# Patient Record
Sex: Male | Born: 1940 | Race: Black or African American | Hispanic: No
Health system: Southern US, Community
[De-identification: ages and names within clinical notes are randomized; demographics above are authoritative.]

## PROBLEM LIST (undated history)

## (undated) DIAGNOSIS — J90 Pleural effusion, not elsewhere classified: Secondary | ICD-10-CM

## (undated) DIAGNOSIS — Z930 Tracheostomy status: Secondary | ICD-10-CM

## (undated) DIAGNOSIS — H409 Unspecified glaucoma: Secondary | ICD-10-CM

## (undated) DIAGNOSIS — I469 Cardiac arrest, cause unspecified: Secondary | ICD-10-CM

## (undated) DIAGNOSIS — T7840XA Allergy, unspecified, initial encounter: Secondary | ICD-10-CM

## (undated) DIAGNOSIS — N189 Chronic kidney disease, unspecified: Secondary | ICD-10-CM

## (undated) DIAGNOSIS — M1990 Unspecified osteoarthritis, unspecified site: Secondary | ICD-10-CM

## (undated) DIAGNOSIS — I482 Chronic atrial fibrillation, unspecified: Secondary | ICD-10-CM

## (undated) DIAGNOSIS — J9621 Acute and chronic respiratory failure with hypoxia: Secondary | ICD-10-CM

## (undated) DIAGNOSIS — I10 Essential (primary) hypertension: Secondary | ICD-10-CM

## (undated) DIAGNOSIS — J189 Pneumonia, unspecified organism: Secondary | ICD-10-CM

## (undated) HISTORY — DX: Chronic kidney disease, unspecified: N18.9

## (undated) HISTORY — DX: Unspecified osteoarthritis, unspecified site: M19.90

## (undated) HISTORY — DX: Unspecified glaucoma: H40.9

## (undated) HISTORY — PX: EYE SURGERY: SHX253

## (undated) HISTORY — PX: COLONOSCOPY: SHX174

## (undated) HISTORY — DX: Allergy, unspecified, initial encounter: T78.40XA

---

## 2013-11-06 DIAGNOSIS — H2589 Other age-related cataract: Secondary | ICD-10-CM | POA: Diagnosis not present

## 2013-11-06 DIAGNOSIS — IMO0002 Reserved for concepts with insufficient information to code with codable children: Secondary | ICD-10-CM | POA: Diagnosis not present

## 2013-11-06 DIAGNOSIS — H02839 Dermatochalasis of unspecified eye, unspecified eyelid: Secondary | ICD-10-CM | POA: Diagnosis not present

## 2013-12-08 ENCOUNTER — Emergency Department (INDEPENDENT_AMBULATORY_CARE_PROVIDER_SITE_OTHER)
Admission: EM | Admit: 2013-12-08 | Discharge: 2013-12-08 | Disposition: A | Discharge status: Home / Self Care | Payer: Medicare Other | Source: Home / Self Care | Attending: Emergency Medicine | Admitting: Emergency Medicine

## 2013-12-08 ENCOUNTER — Encounter (HOSPITAL_COMMUNITY): Payer: Self-pay | Admitting: Emergency Medicine

## 2013-12-08 DIAGNOSIS — I10 Essential (primary) hypertension: Secondary | ICD-10-CM | POA: Diagnosis not present

## 2013-12-08 HISTORY — DX: Essential (primary) hypertension: I10

## 2013-12-08 MED ORDER — HYDROCHLOROTHIAZIDE 25 MG PO TABS: 25.0000 mg | ORAL_TABLET | Freq: Every day | ORAL | Status: DC

## 2013-12-08 MED ORDER — LISINOPRIL 10 MG PO TABS: 10.0000 mg | ORAL_TABLET | Freq: Every day | ORAL | Status: DC

## 2013-12-08 NOTE — Discharge Instructions (Signed)
DASH Eating Plan °DASH stands for "Dietary Approaches to Stop Hypertension." The DASH eating plan is a healthy eating plan that has been shown to reduce high blood pressure (hypertension). Additional health benefits may include reducing the risk of type 2 diabetes mellitus, heart disease, and stroke. The DASH eating plan may also help with weight loss. °WHAT DO I NEED TO KNOW ABOUT THE DASH EATING PLAN? °For the DASH eating plan, you will follow these general guidelines: °· Choose foods with a percent daily value for sodium of less than 5% (as listed on the food label). °· Use salt-free seasonings or herbs instead of table salt or sea salt. °· Check with your health care provider or pharmacist before using salt substitutes. °· Eat lower-sodium products, often labeled as "lower sodium" or "no salt added." °· Eat fresh foods. °· Eat more vegetables, fruits, and low-fat dairy products. °· Choose whole grains. Look for the word "whole" as the first word in the ingredient list. °· Choose fish and skinless chicken or turkey more often than red meat. Limit fish, poultry, and meat to 6 oz (170 g) each day. °· Limit sweets, desserts, sugars, and sugary drinks. °· Choose heart-healthy fats. °· Limit cheese to 1 oz (28 g) per day. °· Eat more home-cooked food and less restaurant, buffet, and fast food. °· Limit fried foods. °· Cook foods using methods other than frying. °· Limit canned vegetables. If you do use them, rinse them well to decrease the sodium. °· When eating at a restaurant, ask that your food be prepared with less salt, or no salt if possible. °WHAT FOODS CAN I EAT? °Seek help from a dietitian for individual calorie needs. °Grains °Whole grain or whole wheat bread. Brown rice. Whole grain or whole wheat pasta. Quinoa, bulgur, and whole grain cereals. Low-sodium cereals. Corn or whole wheat flour tortillas. Whole grain cornbread. Whole grain crackers. Low-sodium crackers. °Vegetables °Fresh or frozen vegetables  (raw, steamed, roasted, or grilled). Low-sodium or reduced-sodium tomato and vegetable juices. Low-sodium or reduced-sodium tomato sauce and paste. Low-sodium or reduced-sodium canned vegetables.  °Fruits °All fresh, canned (in natural juice), or frozen fruits. °Meat and Other Protein Products °Ground beef (85% or leaner), grass-fed beef, or beef trimmed of fat. Skinless chicken or turkey. Ground chicken or turkey. Pork trimmed of fat. All fish and seafood. Eggs. Dried beans, peas, or lentils. Unsalted nuts and seeds. Unsalted canned beans. °Dairy °Low-fat dairy products, such as skim or 1% milk, 2% or reduced-fat cheeses, low-fat ricotta or cottage cheese, or plain low-fat yogurt. Low-sodium or reduced-sodium cheeses. °Fats and Oils °Tub margarines without trans fats. Light or reduced-fat mayonnaise and salad dressings (reduced sodium). Avocado. Safflower, olive, or canola oils. Natural peanut or almond butter. °Other °Unsalted popcorn and pretzels. °The items listed above may not be a complete list of recommended foods or beverages. Contact your dietitian for more options. °WHAT FOODS ARE NOT RECOMMENDED? °Grains °White bread. White pasta. White rice. Refined cornbread. Bagels and croissants. Crackers that contain trans fat. °Vegetables °Creamed or fried vegetables. Vegetables in a cheese sauce. Regular canned vegetables. Regular canned tomato sauce and paste. Regular tomato and vegetable juices. °Fruits °Dried fruits. Canned fruit in light or heavy syrup. Fruit juice. °Meat and Other Protein Products °Fatty cuts of meat. Ribs, chicken wings, bacon, sausage, bologna, salami, chitterlings, fatback, hot dogs, bratwurst, and packaged luncheon meats. Salted nuts and seeds. Canned beans with salt. °Dairy °Whole or 2% milk, cream, half-and-half, and cream cheese. Whole-fat or sweetened yogurt. Full-fat   cheeses or blue cheese. Nondairy creamers and whipped toppings. Processed cheese, cheese spreads, or cheese  curds. °Condiments °Onion and garlic salt, seasoned salt, table salt, and sea salt. Canned and packaged gravies. Worcestershire sauce. Tartar sauce. Barbecue sauce. Teriyaki sauce. Soy sauce, including reduced sodium. Steak sauce. Fish sauce. Oyster sauce. Cocktail sauce. Horseradish. Ketchup and mustard. Meat flavorings and tenderizers. Bouillon cubes. Hot sauce. Tabasco sauce. Marinades. Taco seasonings. Relishes. °Fats and Oils °Butter, stick margarine, lard, shortening, ghee, and bacon fat. Coconut, palm kernel, or palm oils. Regular salad dressings. °Other °Pickles and olives. Salted popcorn and pretzels. °The items listed above may not be a complete list of foods and beverages to avoid. Contact your dietitian for more information. °WHERE CAN I FIND MORE INFORMATION? °National [employer], [employer], [employer]: www.nhlbi.nih.gov/health/health-topics/topics/dash/ °Document Released: 04/20/2011 Document Revised: 09/15/2013 Document Reviewed: 03/05/2013 °Exit[employer]® Patient Information ©2015 [employer]. This information is not intended to replace advice given to you by your health care provider. Make sure you discuss any questions you have with your health care provider. ° °Hypertension °Hypertension, commonly called high blood pressure, is when the force of blood pumping through your arteries is too strong. Your arteries are the blood vessels that carry blood from your heart throughout your body. A blood pressure reading consists of a higher number over a lower number, such as 110/72. The higher number (systolic) is the pressure inside your arteries when your heart pumps. The lower number (diastolic) is the pressure inside your arteries when your heart relaxes. Ideally you want your blood pressure below 120/80. °Hypertension forces your heart to work harder to pump blood. Your arteries may become narrow or stiff. Having hypertension puts you at risk for heart disease, stroke, and other problems.  °RISK  FACTORS °Some risk factors for high blood pressure are controllable. Others are not.  °Risk factors you cannot control include:  °· Race. You may be at higher risk if you are African American. °· Age. Risk increases with age. °· Gender. Men are at higher risk than women before age 45 years. After age 65, women are at higher risk than men. °Risk factors you can control include: °· Not getting enough exercise or physical activity. °· Being overweight. °· Getting too much fat, sugar, calories, or salt in your diet. °· Drinking too much alcohol. °SIGNS AND SYMPTOMS °Hypertension does not usually cause signs or symptoms. Extremely high blood pressure (hypertensive crisis) may cause headache, anxiety, shortness of breath, and nosebleed. °DIAGNOSIS  °To check if you have hypertension, your health care provider will measure your blood pressure while you are seated, with your arm held at the level of your heart. It should be measured at least twice using the same arm. Certain conditions can cause a difference in blood pressure between your right and left arms. A blood pressure reading that is higher than normal on one occasion does not mean that you need treatment. If one blood pressure reading is high, ask your health care provider about having it checked again. °TREATMENT  °Treating high blood pressure includes making lifestyle changes and possibly taking medicine. Living a healthy lifestyle can help lower high blood pressure. You may need to change some of your habits. °Lifestyle changes may include: °· Following the DASH diet. This diet is high in fruits, vegetables, and whole grains. It is low in salt, red meat, and added sugars. °· Getting at least 2½ hours of brisk physical activity every week. °· Losing weight if necessary. °· Not smoking. °·   Limiting alcoholic beverages.  Learning ways to reduce stress. If lifestyle changes are not enough to get your blood pressure under control, your health care provider may  prescribe medicine. You may need to take more than one. Work closely with your health care provider to understand the risks and benefits. HOME CARE INSTRUCTIONS  Have your blood pressure rechecked as directed by your health care provider.   Take medicines only as directed by your health care provider. Follow the directions carefully. Blood pressure medicines must be taken as prescribed. The medicine does not work as well when you skip doses. Skipping doses also puts you at risk for problems.   Do not smoke.   Monitor your blood pressure at home as directed by your health care provider. SEEK MEDICAL CARE IF:   You think you are having a reaction to medicines taken.  You have recurrent headaches or feel dizzy.  You have swelling in your ankles.  You have trouble with your vision. SEEK IMMEDIATE MEDICAL CARE IF:  You develop a severe headache or confusion.  You have unusual weakness, numbness, or feel faint.  You have severe chest or abdominal pain.  You vomit repeatedly.  You have trouble breathing. MAKE SURE YOU:   Understand these instructions.  Will watch your condition.  Will get help right away if you are not doing well or get worse. Document Released: 05/01/2005 Document Revised: 09/15/2013 Document Reviewed: 02/21/2013 [facility] Patient Information 2015 [place], [place]. This information is not intended to replace advice given to you by your health care provider. Make sure you discuss any questions you have with your health care provider.  Managing Your High Blood Pressure Blood pressure is a measurement of how forceful your blood is pressing against the walls of the arteries. Arteries are muscular tubes within the circulatory system. Blood pressure does not stay the same. Blood pressure rises when you are active, excited, or nervous; and it lowers during sleep and relaxation. If the numbers measuring your blood pressure stay above normal most of the time, you are at  risk for health problems. High blood pressure (hypertension) is a long-term (chronic) condition in which blood pressure is elevated. A blood pressure reading is recorded as two numbers, such as 120 over 80 (or 120/80). The first, higher number is called the systolic pressure. It is a measure of the pressure in your arteries as the heart beats. The second, lower number is called the diastolic pressure. It is a measure of the pressure in your arteries as the heart relaxes between beats.  Keeping your blood pressure in a normal range is important to your overall health and prevention of health problems, such as heart disease and stroke. When your blood pressure is uncontrolled, your heart has to work harder than normal. High blood pressure is a very common condition in adults because blood pressure tends to rise with age. Men and women are equally likely to have hypertension but at different times in life. Before age 98, men are more likely to have hypertension. After 72 years of age, women are more likely to have it. Hypertension is especially common in African Americans. This condition often has no signs or symptoms. The cause of the condition is usually not known. Your caregiver can help you come up with a plan to keep your blood pressure in a normal, healthy range. BLOOD PRESSURE STAGES Blood pressure is classified into four stages: normal, prehypertension, stage 1, and stage 2. Your blood pressure reading will be used  to determine what type of treatment, if any, is necessary. Appropriate treatment options are tied to these four stages:  Normal  Systolic pressure (mm Hg): below 120.  Diastolic pressure (mm Hg): below 80. Prehypertension  Systolic pressure (mm Hg): 120 to 139.  Diastolic pressure (mm Hg): 80 to 89. Stage1  Systolic pressure (mm Hg): 140 to 159.  Diastolic pressure (mm Hg): 90 to 99. Stage2  Systolic pressure (mm Hg): 160 or above.  Diastolic pressure (mm Hg): 100 or  above. RISKS RELATED TO HIGH BLOOD PRESSURE Managing your blood pressure is an important responsibility. Uncontrolled high blood pressure can lead to:  A heart attack.  A stroke.  A weakened blood vessel (aneurysm).  Heart failure.  Kidney damage.  Eye damage.  Metabolic syndrome.  Memory and concentration problems. HOW TO MANAGE YOUR BLOOD PRESSURE Blood pressure can be managed effectively with lifestyle changes and medicines (if needed). Your caregiver will help you come up with a plan to bring your blood pressure within a normal range. Your plan should include the following: Education  Read all information provided by your caregivers about how to control blood pressure.  Educate yourself on the latest guidelines and treatment recommendations. New research is always being done to further define the risks and treatments for high blood pressure. Lifestylechanges  Control your weight.  Avoid smoking.  Stay physically active.  Reduce the amount of salt in your diet.  Reduce stress.  Control any chronic conditions, such as high cholesterol or diabetes.  Reduce your alcohol intake. Medicines  Several medicines (antihypertensive medicines) are available, if needed, to bring blood pressure within a normal range. Communication  Review all the medicines you take with your caregiver because there may be side effects or interactions.  Talk with your caregiver about your diet, exercise habits, and other lifestyle factors that may be contributing to high blood pressure.  See your caregiver regularly. Your caregiver can help you create and adjust your plan for managing high blood pressure. RECOMMENDATIONS FOR TREATMENT AND FOLLOW-UP  The following recommendations are based on current guidelines for managing high blood pressure in nonpregnant adults. Use these recommendations to identify the proper follow-up period or treatment option based on your blood pressure reading. You  can discuss these options with your caregiver.  Systolic pressure of 960 to 454 or diastolic pressure of 80 to 89: Follow up with your caregiver as directed.  Systolic pressure of 098 to 119 or diastolic pressure of 90 to 100: Follow up with your caregiver within 2 months.  Systolic pressure above 147 or diastolic pressure above 829: Follow up with your caregiver within 1 month.  Systolic pressure above 562 or diastolic pressure above 130: Consider antihypertensive therapy; follow up with your caregiver within 1 week.  Systolic pressure above 865 or diastolic pressure above 784: Begin antihypertensive therapy; follow up with your caregiver within 1 week. Document Released: 01/24/2012 Document Reviewed: 01/24/2012 [employer] Patient Information 2015 [place]. This information is not intended to replace advice given to you by your health care provider. Make sure you discuss any questions you have with your health care provider.

## 2013-12-08 NOTE — ED Provider Notes (Signed)
Medical screening examination/treatment/procedure(s) were performed by resident physician or non-physician practitioner and as supervising physician I was immediately available for consultation/collaboration.  Maryruth Eve, MD     Melony Overly, MD 12/08/13 [phone]

## 2013-12-08 NOTE — ED Provider Notes (Signed)
CSN: [id number]     Arrival date & time 12/08/13  1247 History   First MD Initiated Contact with Patient 12/08/13 1302     No chief complaint on file.  (Consider location/radiation/quality/duration/timing/severity/associated sxs/prior Treatment) HPI Comments: States he was told he had elevated blood pressure 4-5 years ago by his provider at [facility] and was placed on an unknown medication that he was told was for his blood pressure. States he has long since run out of this medication. Went to have surgery on his right eye today and procedure was cancelled because patient was told his blood pressure was too high. Patient and spouse said they decided to come here for treatment.  Patient reports himself to be asymptomatic. Does not smoke or drink Is retired   Patient is a 73 y.o. male presenting with hypertension. The history is provided by the patient and the spouse.  Hypertension This is a chronic problem. Episode onset: ? 4-5 years. The problem occurs constantly. Associated symptoms comments: none.    No past medical history on file. No past surgical history on file. No family history on file. History  Substance Use Topics  . Smoking status: Not on file  . Smokeless tobacco: Not on file  . Alcohol Use: Not on file    Review of Systems  All other systems reviewed and are negative.   Allergies  Review of patient's allergies indicates not on file.  Home Medications   Prior to Admission medications   Medication Sig Start Date End Date Taking? Authorizing Provider  hydrochlorothiazide (HYDRODIURIL) 25 MG tablet Take 1 tablet (25 mg total) by mouth daily. 12/08/13   Lahoma Rocker, PA  lisinopril (PRINIVIL,ZESTRIL) 10 MG tablet Take 1 tablet (10 mg total) by mouth daily. 12/08/13   Annett Gula Coralie Stanke, PA   BP 213/111  Pulse 72  Temp(Src) 98.1 F (36.7 C) (Oral)  Resp 18  Ht 5\' 11"  (1.803 m)  Wt 225 lb (102.059 kg)  BMI 31.39 kg/m2  SpO2  97% Physical Exam  Nursing note and vitals reviewed. Constitutional: He is oriented to person, place, and time. He appears well-developed and well-nourished. No distress.  +overweight  HENT:  Head: Normocephalic and atraumatic.  Eyes: Conjunctivae are normal. No scleral icterus.  Neck: Normal range of motion. Neck supple.  Cardiovascular: Normal rate, regular rhythm and normal heart sounds.   Pulmonary/Chest: Effort normal and breath sounds normal. No respiratory distress. He has no wheezes.  Musculoskeletal: Normal range of motion. He exhibits edema. He exhibits no tenderness.  1-2+ non-pitting edema at bilateral ankles  Neurological: He is alert and oriented to person, place, and time.  Skin: Skin is warm and dry.  Psychiatric: He has a normal mood and affect. His behavior is normal.    ED Course  Procedures (including critical care time) Labs Review Labs Reviewed - No data to display  Imaging Review No results found.   MDM   1. Essential hypertension    Patient advised he would need to re-establish care with either [employer] or the primary care physician of his choice for continued management of his elevated blood pressure and for any additional routine medical needs. Will have patient begin taking HCTZ 25 mg po QD & Lisinopril 10mg  po QD.    Lahoma Rocker, PA 12/08/13 1332

## 2013-12-08 NOTE — ED Notes (Signed)
Provider eval only 

## 2013-12-15 ENCOUNTER — Ambulatory Visit (INDEPENDENT_AMBULATORY_CARE_PROVIDER_SITE_OTHER): Payer: Medicare Other | Admitting: Internal Medicine

## 2013-12-15 ENCOUNTER — Ambulatory Visit (INDEPENDENT_AMBULATORY_CARE_PROVIDER_SITE_OTHER): Payer: Medicare Other

## 2013-12-15 VITALS — BP 202/108 | HR 62 | Temp 98.3°F | Resp 16 | Ht 71.0 in | Wt 241.0 lb

## 2013-12-15 DIAGNOSIS — Z5181 Encounter for therapeutic drug level monitoring: Secondary | ICD-10-CM | POA: Diagnosis not present

## 2013-12-15 DIAGNOSIS — E669 Obesity, unspecified: Secondary | ICD-10-CM | POA: Diagnosis not present

## 2013-12-15 DIAGNOSIS — R0989 Other specified symptoms and signs involving the circulatory and respiratory systems: Secondary | ICD-10-CM

## 2013-12-15 DIAGNOSIS — R609 Edema, unspecified: Secondary | ICD-10-CM | POA: Diagnosis not present

## 2013-12-15 DIAGNOSIS — R0689 Other abnormalities of breathing: Secondary | ICD-10-CM

## 2013-12-15 DIAGNOSIS — I10 Essential (primary) hypertension: Secondary | ICD-10-CM | POA: Diagnosis not present

## 2013-12-15 DIAGNOSIS — Z125 Encounter for screening for malignant neoplasm of prostate: Secondary | ICD-10-CM

## 2013-12-15 DIAGNOSIS — I517 Cardiomegaly: Secondary | ICD-10-CM

## 2013-12-15 LAB — POCT CBC
Granulocyte percent: 64.6 %G (ref 37–80)
HEMATOCRIT: 44.8 % (ref 43.5–53.7)
HEMOGLOBIN: 14.5 g/dL (ref 14.1–18.1)
Lymph, poc: 1.9 (ref 0.6–3.4)
MCH, POC: 27.7 pg (ref 27–31.2)
MCHC: 32.4 g/dL (ref 31.8–35.4)
MCV: 85.4 fL (ref 80–97)
MID (cbc): 0.5 (ref 0–0.9)
MPV: 7.7 fL (ref 0–99.8)
POC GRANULOCYTE: 4.4 (ref 2–6.9)
POC LYMPH %: 28.1 % (ref 10–50)
POC MID %: 7.3 %M (ref 0–12)
Platelet Count, POC: 162 10*3/uL (ref 142–424)
RBC: 5.25 M/uL (ref 4.69–6.13)
RDW, POC: 14.3 %
WBC: 6.8 10*3/uL (ref 4.6–10.2)

## 2013-12-15 LAB — GLUCOSE, POCT (MANUAL RESULT ENTRY): POC GLUCOSE: 80 mg/dL (ref 70–99)

## 2013-12-15 LAB — POCT UA - MICROSCOPIC ONLY
BACTERIA, U MICROSCOPIC: NEGATIVE
CASTS, UR, LPF, POC: NEGATIVE
Crystals, Ur, HPF, POC: NEGATIVE
Mucus, UA: NEGATIVE
RBC, urine, microscopic: NEGATIVE
Yeast, UA: NEGATIVE

## 2013-12-15 LAB — POCT URINALYSIS DIPSTICK
BILIRUBIN UA: NEGATIVE
Blood, UA: NEGATIVE
GLUCOSE UA: NEGATIVE
Ketones, UA: NEGATIVE
Leukocytes, UA: NEGATIVE
NITRITE UA: NEGATIVE
SPEC GRAV UA: 1.015
UROBILINOGEN UA: 0.2
pH, UA: 6

## 2013-12-15 LAB — POCT GLYCOSYLATED HEMOGLOBIN (HGB A1C): HEMOGLOBIN A1C: 5.8

## 2013-12-15 IMAGING — CR DG CHEST 2V
2 series · 2 of 2 positions shown · non-contrast
Comparison: None.

CLINICAL DATA: Abnormal breath sounds, edema. History of
hypertension.

EXAM:
CHEST  2 VIEW

[PA]
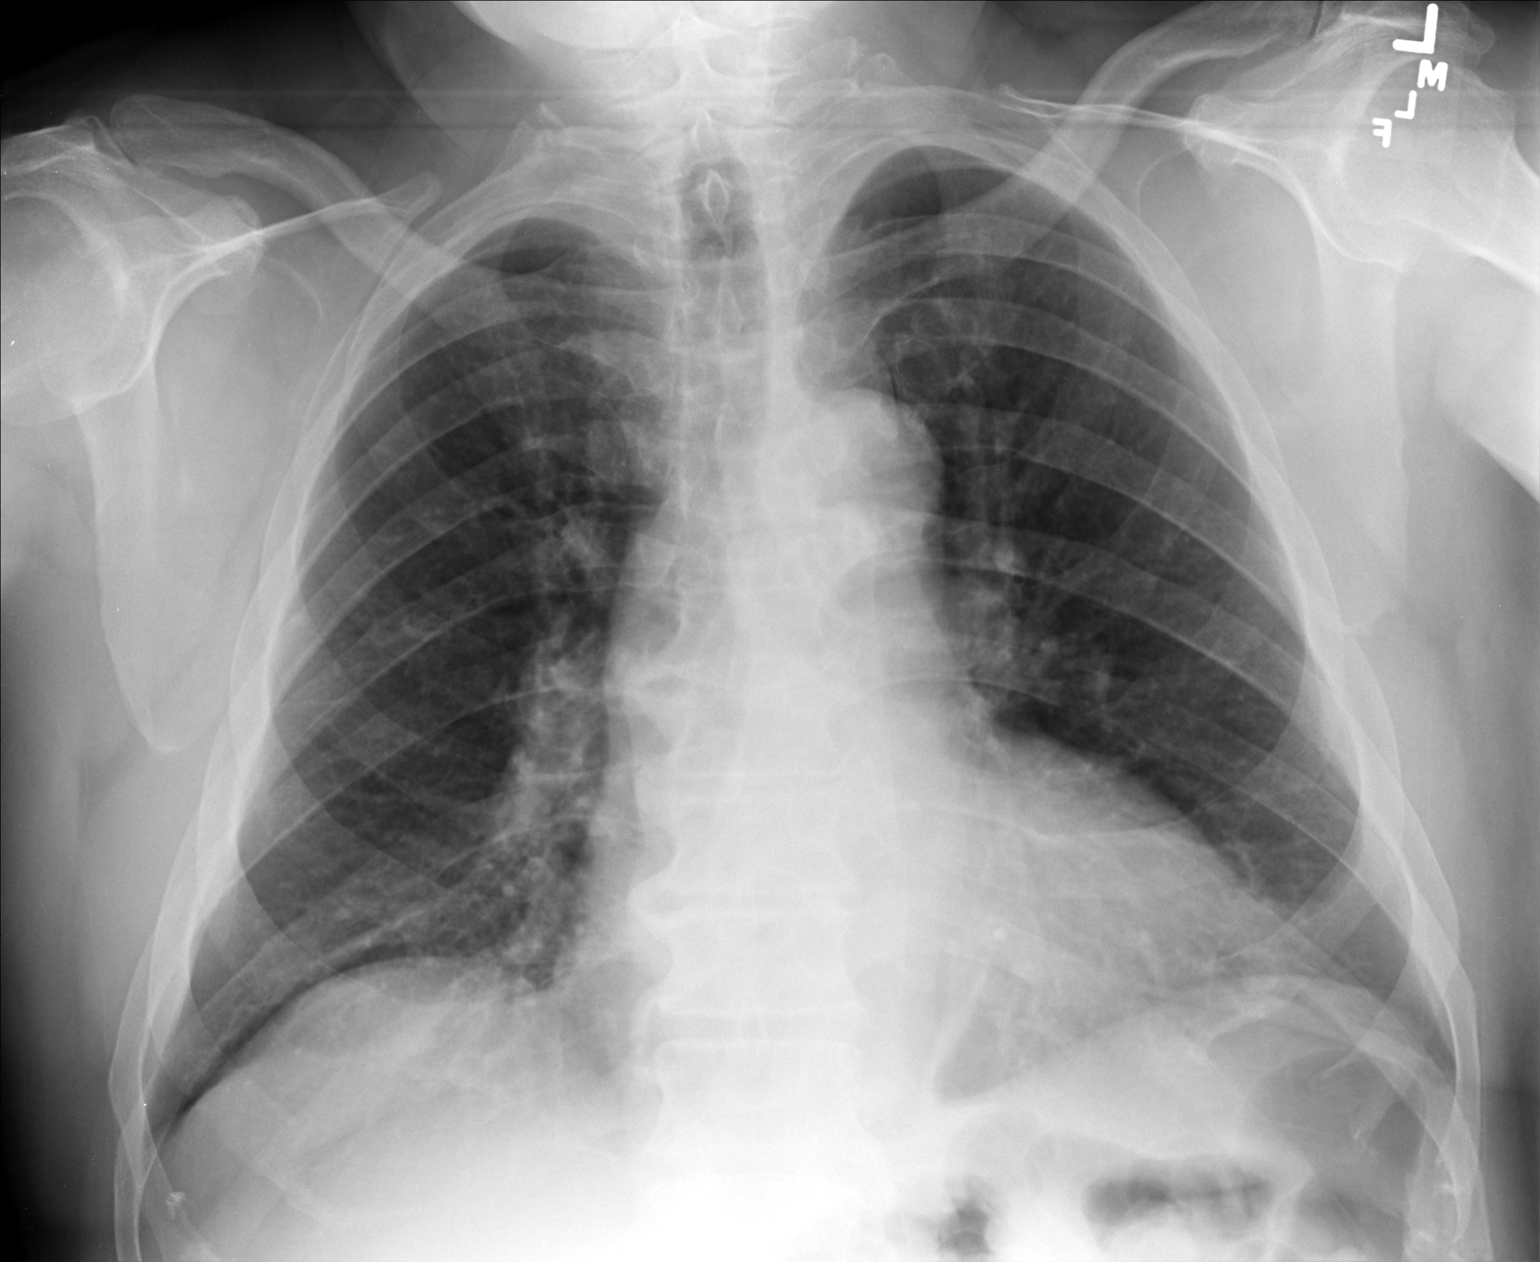

[lateral]
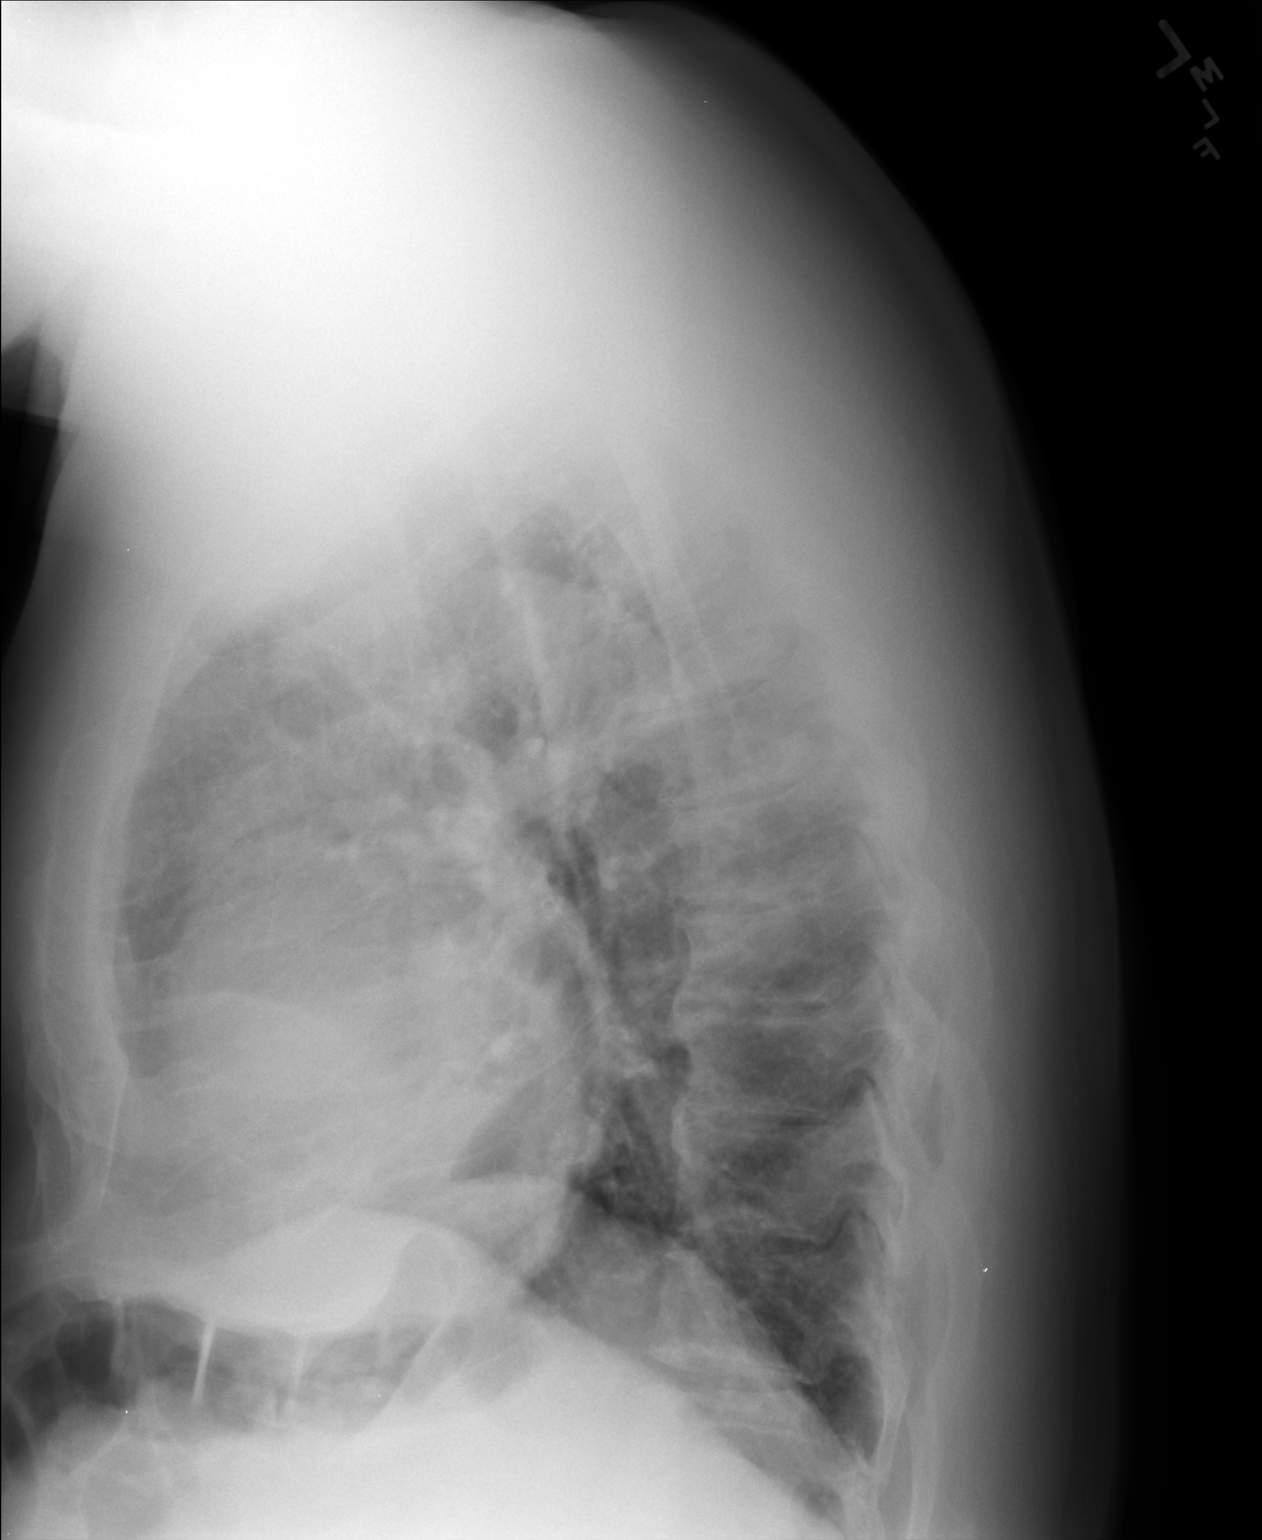

[2 of 2 positions shown; findings below may reference images not displayed]

FINDINGS: The heart size is at upper limits of normal with unfolding/ectasia
of the aorta. Both lungs are clear. The visualized skeletal
structures are unremarkable.
IMPRESSION: No active cardiopulmonary disease.

## 2013-12-15 MED ORDER — AMLODIPINE BESYLATE 10 MG PO TABS
10.0000 mg | ORAL_TABLET | Freq: Every day | ORAL | Status: DC
Start: 2013-12-15 — End: 2014-12-04

## 2013-12-15 MED ORDER — LISINOPRIL-HYDROCHLOROTHIAZIDE 20-25 MG PO TABS: 1.0000 | ORAL_TABLET | Freq: Every day | ORAL | Status: DC

## 2013-12-15 NOTE — Progress Notes (Signed)
   Subjective:    Patient ID: Daniel [name], male    DOB: Oct 01, 1940, 73 y.o.   MRN: [id number]  HPI    Review of Systems     Objective:   Physical Exam        Assessment & Plan:

## 2013-12-15 NOTE — Progress Notes (Signed)
Subjective:    Patient ID: Daniel [name], male    DOB: Mar 22, 1941, 73 y.o.   MRN: [id number]  HPI 73 year old male complains of hypertension. 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 His blood pressure was 207/102 right before surgery so they would not perform surgery till his blood pressure is under control. No previous history of hypertension. He has been put on HCTZ and Lisinopril by [facility] on on 12/08/2013 and his blood pressure has still been running high. He did not have lab screening or EKG. 6 years ago his wifed died and he retired and he gained 30lbs and HTN started and he quit having regular medical care. He feels good but his daughter noticed he had leg edema. No sob, cp, syncope, anorexia,fatigue. He took his meds today.    Review of Systems     Objective:   Physical Exam  Vitals reviewed. Constitutional: He is oriented to person, place, and time. He appears well-developed and well-nourished. No distress.  HENT:  Head: Normocephalic.  Right Ear: External ear normal.  Left Ear: External ear normal.  Mouth/Throat: Oropharynx is clear and moist.  Eyes: EOM are normal. Pupils are equal, round, and reactive to light.  Neck: Normal range of motion. Neck supple. No thyromegaly present.  Cardiovascular: Normal rate, regular rhythm, normal heart sounds and intact distal pulses.   No murmur heard. Pulmonary/Chest: Effort normal. Not tachypneic. No respiratory distress. He has no decreased breath sounds. He has wheezes. He has rhonchi. He has no rales.   He exhibits no tenderness.  Abnormal BS does not clear with coughing  Abdominal: Soft. He exhibits no mass. There is no tenderness.  Musculoskeletal: He exhibits edema.  Lymphadenopathy:    He has no cervical adenopathy.  Neurological: He is alert and oriented to person, place, and time. He exhibits normal muscle tone. Coordination normal.  Skin: Skin is dry and intact.     2-3+ pitting edema  Dry,  scaly, intact skin  Psychiatric: He has a normal mood and affect. His behavior is normal. Judgment and thought content normal.   EKG [facility] reading (PRIMARY) by  DrGuest cardiomegaly, no effusion  Labs No results found for this or any previous visit.  Results for orders placed in visit on 12/15/13  POCT CBC      Result Value Ref Range   WBC 6.0  4.6 - 10.2 K/uL   Lymph, poc 2.0  0.6 - 3.4   POC LYMPH PERCENT 33.1  10 - 50 %L   MID (cbc) 0.4  0 - 0.9   POC MID % 7.0  0 - 12 %M   POC Granulocyte 3.6  2 - 6.9   Granulocyte percent 59.9  37 - 80 %G   RBC 5.17  4.69 - 6.13 M/uL   Hemoglobin 14.2  14.1 - 18.1 g/dL   HCT, POC 44.3  43.5 - 53.7 %   MCV 85.6  80 - 97 fL   MCH, POC 27.5  27 - 31.2 pg   MCHC 32.2  31.8 - 35.4 g/dL   RDW, POC 13.8     Platelet Count, POC 67.0 (*) 142 - 424 K/uL   MPV 8.3  0 - 99.8 fL  GLUCOSE, POCT (MANUAL RESULT ENTRY)      Result Value Ref Range   POC Glucose 80  70 - 99 mg/dl  POCT GLYCOSYLATED HEMOGLOBIN (HGB A1C)      Result Value Ref Range  Hemoglobin A1C 5.8    POCT UA - MICROSCOPIC ONLY      Result Value Ref Range   WBC, Ur, HPF, POC 0-3     RBC, urine, microscopic neg     Bacteria, U Microscopic neg     Mucus, UA neg     Epithelial cells, urine per micros 0-4     Crystals, Ur, HPF, POC neg     Casts, Ur, LPF, POC neg     Yeast, UA neg    POCT URINALYSIS DIPSTICK      Result Value Ref Range   Color, UA yellow     Clarity, UA clear     Glucose, UA neg     Bilirubin, UA neg     Ketones, UA neg     Spec Grav, UA 1.015     Blood, UA neg     pH, UA 6.0     Protein, UA trace     Urobilinogen, UA 0.2     Nitrite, UA neg     Leukocytes, UA Negative     Thrombocytopenia ! This is unexpected, will repeat with new blood draw      Assessment & Plan:  Schedule cpe and follow up 104 2 weeks Dash diet Compression stockings medium strength  Start lisinopril 20/25 and amlodipine 10mg  qd  HTN/Edema/Med adjustment RTC 5d see me

## 2013-12-15 NOTE — Patient Instructions (Addendum)
DASH Eating Plan DASH stands for "Dietary Approaches to Stop Hypertension." The DASH eating plan is a healthy eating plan that has been shown to reduce high blood pressure (hypertension). Additional health benefits may include reducing the risk of type 2 diabetes mellitus, heart disease, and stroke. The DASH eating plan may also help with weight loss. WHAT DO I NEED TO KNOW ABOUT THE DASH EATING PLAN? For the DASH eating plan, you will follow these general guidelines:  Choose foods with a percent daily value for sodium of less than 5% (as listed on the food label).  Use salt-free seasonings or herbs instead of table salt or sea salt.  Check with your health care provider or pharmacist before using salt substitutes.  Eat lower-sodium products, often labeled as "lower sodium" or "no salt added."  Eat fresh foods.  Eat more vegetables, fruits, and low-fat dairy products.  Choose whole grains. Look for the word "whole" as the first word in the ingredient list.  Choose fish and skinless chicken or turkey more often than red meat. Limit fish, poultry, and meat to 6 oz (170 g) each day.  Limit sweets, desserts, sugars, and sugary drinks.  Choose heart-healthy fats.  Limit cheese to 1 oz (28 g) per day.  Eat more home-cooked food and less restaurant, buffet, and fast food.  Limit fried foods.  Cook foods using methods other than frying.  Limit canned vegetables. If you do use them, rinse them well to decrease the sodium.  When eating at a restaurant, ask that your food be prepared with less salt, or no salt if possible. WHAT FOODS CAN I EAT? Seek help from a dietitian for individual calorie needs. Grains Whole grain or whole wheat bread. Brown rice. Whole grain or whole wheat pasta. Quinoa, bulgur, and whole grain cereals. Low-sodium cereals. Corn or whole wheat flour tortillas. Whole grain cornbread. Whole grain crackers. Low-sodium crackers. Vegetables Fresh or frozen vegetables  (raw, steamed, roasted, or grilled). Low-sodium or reduced-sodium tomato and vegetable juices. Low-sodium or reduced-sodium tomato sauce and paste. Low-sodium or reduced-sodium canned vegetables.  Fruits All fresh, canned (in natural juice), or frozen fruits. Meat and Other Protein Products Ground beef (85% or leaner), grass-fed beef, or beef trimmed of fat. Skinless chicken or turkey. Ground chicken or turkey. Pork trimmed of fat. All fish and seafood. Eggs. Dried beans, peas, or lentils. Unsalted nuts and seeds. Unsalted canned beans. Dairy Low-fat dairy products, such as skim or 1% milk, 2% or reduced-fat cheeses, low-fat ricotta or cottage cheese, or plain low-fat yogurt. Low-sodium or reduced-sodium cheeses. Fats and Oils Tub margarines without trans fats. Light or reduced-fat mayonnaise and salad dressings (reduced sodium). Avocado. Safflower, olive, or canola oils. Natural peanut or almond butter. Other Unsalted popcorn and pretzels. The items listed above may not be a complete list of recommended foods or beverages. Contact your dietitian for more options. WHAT FOODS ARE NOT RECOMMENDED? Grains White bread. White pasta. White rice. Refined cornbread. Bagels and croissants. Crackers that contain trans fat. Vegetables Creamed or fried vegetables. Vegetables in a cheese sauce. Regular canned vegetables. Regular canned tomato sauce and paste. Regular tomato and vegetable juices. Fruits Dried fruits. Canned fruit in light or heavy syrup. Fruit juice. Meat and Other Protein Products Fatty cuts of meat. Ribs, chicken wings, bacon, sausage, bologna, salami, chitterlings, fatback, hot dogs, bratwurst, and packaged luncheon meats. Salted nuts and seeds. Canned beans with salt. Dairy Whole or 2% milk, cream, half-and-half, and cream cheese. Whole-fat or sweetened yogurt. Full-fat   cheeses or blue cheese. Nondairy creamers and whipped toppings. Processed cheese, cheese spreads, or cheese  curds. Condiments Onion and garlic salt, seasoned salt, table salt, and sea salt. Canned and packaged gravies. Worcestershire sauce. Tartar sauce. Barbecue sauce. Teriyaki sauce. Soy sauce, including reduced sodium. Steak sauce. Fish sauce. Oyster sauce. Cocktail sauce. Horseradish. Ketchup and mustard. Meat flavorings and tenderizers. Bouillon cubes. Hot sauce. Tabasco sauce. Marinades. Taco seasonings. Relishes. Fats and Oils Butter, stick margarine, lard, shortening, ghee, and bacon fat. Coconut, palm kernel, or palm oils. Regular salad dressings. Other Pickles and olives. Salted popcorn and pretzels. The items listed above may not be a complete list of foods and beverages to avoid. Contact your dietitian for more information. WHERE CAN I FIND MORE INFORMATION? National Heart, Lung, and Blood Institute: travelstabloid.com Document Released: 04/20/2011 Document Revised: 09/15/2013 Document Reviewed: 03/05/2013 [facility] Patient Information 2015 [place], [place]. This information is not intended to replace advice given to you by your health care provider. Make sure you discuss any questions you have with your health care provider. Hypertension Hypertension, commonly called high blood pressure, is when the force of blood pumping through your arteries is too strong. Your arteries are the blood vessels that carry blood from your heart throughout your body. A blood pressure reading consists of a higher number over a lower number, such as 110/72. The higher number (systolic) is the pressure inside your arteries when your heart pumps. The lower number (diastolic) is the pressure inside your arteries when your heart relaxes. Ideally you want your blood pressure below 120/80. Hypertension forces your heart to work harder to pump blood. Your arteries may become narrow or stiff. Having hypertension puts you at risk for heart disease, stroke, and other problems.  RISK  FACTORS Some risk factors for high blood pressure are controllable. Others are not.  Risk factors you cannot control include:   Race. You may be at higher risk if you are African American.  Age. Risk increases with age.  Gender. Men are at higher risk than women before age 37 years. After age 55, women are at higher risk than men. Risk factors you can control include:  Not getting enough exercise or physical activity.  Being overweight.  Getting too much fat, sugar, calories, or salt in your diet.  Drinking too much alcohol. SIGNS AND SYMPTOMS Hypertension does not usually cause signs or symptoms. Extremely high blood pressure (hypertensive crisis) may cause headache, anxiety, shortness of breath, and nosebleed. DIAGNOSIS  To check if you have hypertension, your health care provider will measure your blood pressure while you are seated, with your arm held at the level of your heart. It should be measured at least twice using the same arm. Certain conditions can cause a difference in blood pressure between your right and left arms. A blood pressure reading that is higher than normal on one occasion does not mean that you need treatment. If one blood pressure reading is high, ask your health care provider about having it checked again. TREATMENT  Treating high blood pressure includes making lifestyle changes and possibly taking medicine. Living a healthy lifestyle can help lower high blood pressure. You may need to change some of your habits. Lifestyle changes may include:  Following the DASH diet. This diet is high in fruits, vegetables, and whole grains. It is low in salt, red meat, and added sugars.  Getting at least 2 hours of brisk physical activity every week.  Losing weight if necessary.  Not smoking.  Limiting  alcoholic beverages.  Learning ways to reduce stress. If lifestyle changes are not enough to get your blood pressure under control, your health care provider may  prescribe medicine. You may need to take more than one. Work closely with your health care provider to understand the risks and benefits. HOME CARE INSTRUCTIONS  Have your blood pressure rechecked as directed by your health care provider.   Take medicines only as directed by your health care provider. Follow the directions carefully. Blood pressure medicines must be taken as prescribed. The medicine does not work as well when you skip doses. Skipping doses also puts you at risk for problems.   Do not smoke.   Monitor your blood pressure at home as directed by your health care provider. SEEK MEDICAL CARE IF:   You think you are having a reaction to medicines taken.  You have recurrent headaches or feel dizzy.  You have swelling in your ankles.  You have trouble with your vision. SEEK IMMEDIATE MEDICAL CARE IF:  You develop a severe headache or confusion.  You have unusual weakness, numbness, or feel faint.  You have severe chest or abdominal pain.  You vomit repeatedly.  You have trouble breathing. MAKE SURE YOU:   Understand these instructions.  Will watch your condition.  Will get help right away if you are not doing well or get worse. Document Released: 05/01/2005 Document Revised: 09/15/2013 Document Reviewed: 02/21/2013 [facility] Patient Information 2015 [place], [place]. This information is not intended to replace advice given to you by your health care provider. Make sure you discuss any questions you have with your health care provider. Edema Edema is an abnormal buildup of fluids in your bodytissues. Edema is somewhatdependent on gravity to pull the fluid to the lowest place in your body. That makes the condition more common in the legs and thighs (lower extremities). Painless swelling of the feet and ankles is common and becomes more likely as you get older. It is also common in looser tissues, like around your eyes.  When the affected area is squeezed, the fluid  may move out of that spot and leave a dent for a few moments. This dent is called pitting.  CAUSES  There are many possible causes of edema. Eating too much salt and being on your feet or sitting for a long time can cause edema in your legs and ankles. Hot weather may make edema worse. Common medical causes of edema include:  Heart failure.  Liver disease.  Kidney disease.  Weak blood vessels in your legs.  Cancer.  An injury.  Pregnancy.  Some medications.  Obesity. SYMPTOMS  Edema is usually painless.Your skin may look swollen or shiny.  DIAGNOSIS  Your health care provider may be able to diagnose edema by asking about your medical history and doing a physical exam. You may need to have tests such as X-rays, an electrocardiogram, or blood tests to check for medical conditions that may cause edema.  TREATMENT  Edema treatment depends on the cause. If you have heart, liver, or kidney disease, you need the treatment appropriate for these conditions. General treatment may include:  Elevation of the affected body part above the level of your heart.  Compression of the affected body part. Pressure from elastic bandages or support stockings squeezes the tissues and forces fluid back into the blood vessels. This keeps fluid from entering the tissues.  Restriction of fluid and salt intake.  Use of a water pill (diuretic). These medications are appropriate  only for some types of edema. They pull fluid out of your body and make you urinate more often. This gets rid of fluid and reduces swelling, but diuretics can have side effects. Only use diuretics as directed by your health care provider. HOME CARE INSTRUCTIONS   Keep the affected body part above the level of your heart when you are lying down.   Do not sit still or stand for prolonged periods.   Do not put anything directly under your knees when lying down.  Do not wear constricting clothing or garters on your upper legs.    Exercise your legs to work the fluid back into your blood vessels. This may help the swelling go down.   Wear elastic bandages or support stockings to reduce ankle swelling as directed by your health care provider.   Eat a low-salt diet to reduce fluid if your health care provider recommends it.   Only take medicines as directed by your health care provider. SEEK MEDICAL CARE IF:   Your edema is not responding to treatment.  You have heart, liver, or kidney disease and notice symptoms of edema.  You have edema in your legs that does not improve after elevating them.   You have sudden and unexplained weight gain. SEEK IMMEDIATE MEDICAL CARE IF:   You develop shortness of breath or chest pain.   You cannot breathe when you lie down.  You develop pain, redness, or warmth in the swollen areas.   You have heart, liver, or kidney disease and suddenly get edema.  You have a fever and your symptoms suddenly get worse. MAKE SURE YOU:   Understand these instructions.  Will watch your condition.  Will get help right away if you are not doing well or get worse. Document Released: 05/01/2005 Document Revised: 09/15/2013 Document Reviewed: 02/21/2013 [facility] Patient Information 2015 [place], [place]. This information is not intended to replace advice given to you by your health care provider. Make sure you discuss any questions you have with your health care provider.

## 2013-12-16 LAB — TSH: TSH: 5.219 u[IU]/mL — AB (ref 0.350–4.500)

## 2013-12-16 LAB — COMPREHENSIVE METABOLIC PANEL
ALBUMIN: 4.6 g/dL (ref 3.5–5.2)
ALT: 29 U/L (ref 0–53)
AST: 24 U/L (ref 0–37)
Alkaline Phosphatase: 59 U/L (ref 39–117)
BUN: 20 mg/dL (ref 6–23)
CHLORIDE: 100 meq/L (ref 96–112)
CO2: 29 mEq/L (ref 19–32)
Calcium: 9.7 mg/dL (ref 8.4–10.5)
Creat: 1.3 mg/dL (ref 0.50–1.35)
Glucose, Bld: 72 mg/dL (ref 70–99)
POTASSIUM: 3.6 meq/L (ref 3.5–5.3)
SODIUM: 143 meq/L (ref 135–145)
TOTAL PROTEIN: 7.5 g/dL (ref 6.0–8.3)
Total Bilirubin: 0.6 mg/dL (ref 0.2–1.2)

## 2013-12-16 LAB — PSA, MEDICARE: PSA: 1.46 ng/mL (ref <=4.00)

## 2013-12-16 LAB — LIPID PANEL
Cholesterol: 105 mg/dL (ref 0–200)
HDL: 35 mg/dL — ABNORMAL LOW (ref >39)
LDL Cholesterol: 51 mg/dL (ref 0–99)
Total CHOL/HDL Ratio: 3 Ratio
Triglycerides: 95 mg/dL (ref <150)
VLDL: 19 mg/dL (ref 0–40)

## 2013-12-19 ENCOUNTER — Ambulatory Visit (INDEPENDENT_AMBULATORY_CARE_PROVIDER_SITE_OTHER): Payer: Medicare Other | Admitting: Internal Medicine

## 2013-12-19 VITALS — BP 161/76 | HR 77 | Temp 98.1°F | Resp 16 | Ht 69.0 in | Wt 235.8 lb

## 2013-12-19 DIAGNOSIS — IMO0001 Reserved for inherently not codable concepts without codable children: Secondary | ICD-10-CM

## 2013-12-19 DIAGNOSIS — I10 Essential (primary) hypertension: Secondary | ICD-10-CM

## 2013-12-19 DIAGNOSIS — R609 Edema, unspecified: Secondary | ICD-10-CM | POA: Diagnosis not present

## 2013-12-19 DIAGNOSIS — Z789 Other specified health status: Secondary | ICD-10-CM

## 2013-12-19 NOTE — Progress Notes (Signed)
   Subjective:    Patient ID: Daniel [name], male    DOB: 1940/06/06, 73 y.o.   MRN: [id number]  HPI    Review of Systems     Objective:   Physical Exam        Assessment & Plan:  Stop HCTZ

## 2013-12-19 NOTE — Patient Instructions (Signed)
Exercise to Stay Healthy Exercise helps you become and stay healthy. EXERCISE IDEAS AND TIPS Choose exercises that:  You enjoy.  Fit into your day. You do not need to exercise really hard to be healthy. You can do exercises at a slow or medium level and stay healthy. You can:  Stretch before and after working out.  Try yoga, Pilates, or tai chi.  Lift weights.  Walk fast, swim, jog, run, climb stairs, bicycle, dance, or rollerskate.  Take aerobic classes. Exercises that burn about 150 calories:  Running 1  miles in 15 minutes.  Playing volleyball for 45 to 60 minutes.  Washing and waxing a car for 45 to 60 minutes.  Playing touch football for 45 minutes.  Walking 1  miles in 35 minutes.  Pushing a stroller 1  miles in 30 minutes.  Playing basketball for 30 minutes.  Raking leaves for 30 minutes.  Bicycling 5 miles in 30 minutes.  Walking 2 miles in 30 minutes.  Dancing for 30 minutes.  Shoveling snow for 15 minutes.  Swimming laps for 20 minutes.  Walking up stairs for 15 minutes.  Bicycling 4 miles in 15 minutes.  Gardening for 30 to 45 minutes.  Jumping rope for 15 minutes.  Washing windows or floors for 45 to 60 minutes. Document Released: 06/03/2010 Document Revised: 07/24/2011 Document Reviewed: 06/03/2010 [facility] Patient Information 2015 [place], [place]. This information is not intended to replace advice given to you by your health care provider. Make sure you discuss any questions you have with your health care provider. DASH Eating Plan DASH stands for "Dietary Approaches to Stop Hypertension." The DASH eating plan is a healthy eating plan that has been shown to reduce high blood pressure (hypertension). Additional health benefits may include reducing the risk of type 2 diabetes mellitus, heart disease, and stroke. The DASH eating plan may also help with weight loss. WHAT DO I NEED TO KNOW ABOUT THE DASH EATING PLAN? For the DASH eating plan,  you will follow these general guidelines:  Choose foods with a percent daily value for sodium of less than 5% (as listed on the food label).  Use salt-free seasonings or herbs instead of table salt or sea salt.  Check with your health care provider or pharmacist before using salt substitutes.  Eat lower-sodium products, often labeled as "lower sodium" or "no salt added."  Eat fresh foods.  Eat more vegetables, fruits, and low-fat dairy products.  Choose whole grains. Look for the word "whole" as the first word in the ingredient list.  Choose fish and skinless chicken or [place] more often than red meat. Limit fish, poultry, and meat to 6 oz (170 g) each day.  Limit sweets, desserts, sugars, and sugary drinks.  Choose heart-healthy fats.  Limit cheese to 1 oz (28 g) per day.  Eat more home-cooked food and less restaurant, buffet, and fast food.  Limit fried foods.  Cook foods using methods other than frying.  Limit canned vegetables. If you do use them, rinse them well to decrease the sodium.  When eating at a restaurant, ask that your food be prepared with less salt, or no salt if possible. WHAT FOODS CAN I EAT? Seek help from a dietitian for individual calorie needs. Grains Whole grain or whole wheat bread. Brown rice. Whole grain or whole wheat pasta. Quinoa, bulgur, and whole grain cereals. Low-sodium cereals. Corn or whole wheat flour tortillas. Whole grain cornbread. Whole grain crackers. Low-sodium crackers. Vegetables Fresh or frozen vegetables (raw,  steamed, roasted, or grilled). Low-sodium or reduced-sodium tomato and vegetable juices. Low-sodium or reduced-sodium tomato sauce and paste. Low-sodium or reduced-sodium canned vegetables.  Fruits All fresh, canned (in natural juice), or frozen fruits. Meat and Other Protein Products Ground beef (85% or leaner), grass-fed beef, or beef trimmed of fat. Skinless chicken or [place]. Ground chicken or [place]. Pork trimmed of  fat. All fish and seafood. Eggs. Dried beans, peas, or lentils. Unsalted nuts and seeds. Unsalted canned beans. Dairy Low-fat dairy products, such as skim or 1% milk, 2% or reduced-fat cheeses, low-fat ricotta or cottage cheese, or plain low-fat yogurt. Low-sodium or reduced-sodium cheeses. Fats and Oils Tub margarines without trans fats. Light or reduced-fat mayonnaise and salad dressings (reduced sodium). Avocado. Safflower, olive, or canola oils. Natural peanut or almond butter. Other Unsalted popcorn and pretzels. The items listed above may not be a complete list of recommended foods or beverages. Contact your dietitian for more options. WHAT FOODS ARE NOT RECOMMENDED? Grains White bread. White pasta. White rice. Refined cornbread. Bagels and croissants. Crackers that contain trans fat. Vegetables Creamed or fried vegetables. Vegetables in a cheese sauce. Regular canned vegetables. Regular canned tomato sauce and paste. Regular tomato and vegetable juices. Fruits Dried fruits. Canned fruit in light or heavy syrup. Fruit juice. Meat and Other Protein Products Fatty cuts of meat. Ribs, chicken wings, bacon, sausage, bologna, salami, chitterlings, fatback, hot dogs, bratwurst, and packaged luncheon meats. Salted nuts and seeds. Canned beans with salt. Dairy Whole or 2% milk, cream, half-and-half, and cream cheese. Whole-fat or sweetened yogurt. Full-fat cheeses or blue cheese. Nondairy creamers and whipped toppings. Processed cheese, cheese spreads, or cheese curds. Condiments Onion and garlic salt, seasoned salt, table salt, and sea salt. Canned and packaged gravies. Worcestershire sauce. Tartar sauce. Barbecue sauce. Teriyaki sauce. Soy sauce, including reduced sodium. Steak sauce. Fish sauce. Oyster sauce. Cocktail sauce. Horseradish. Ketchup and mustard. Meat flavorings and tenderizers. Bouillon cubes. Hot sauce. Tabasco sauce. Marinades. Taco seasonings. Relishes. Fats and Oils Butter,  stick margarine, lard, shortening, ghee, and bacon fat. Coconut, palm kernel, or palm oils. Regular salad dressings. Other Pickles and olives. Salted popcorn and pretzels. The items listed above may not be a complete list of foods and beverages to avoid. Contact your dietitian for more information. WHERE CAN I FIND MORE INFORMATION? National Heart, Lung, and Blood Institute: travelstabloid.com Document Released: 04/20/2011 Document Revised: 09/15/2013 Document Reviewed: 03/05/2013 [facility] Patient Information 2015 [place], [place]. This information is not intended to replace advice given to you by your health care provider. Make sure you discuss any questions you have with your health care provider.

## 2013-12-19 NOTE — Progress Notes (Signed)
Subjective:    Patient ID: Daniel [name], male    DOB: [birth year], 73 y.o.   MRN: [id number]  HPI 73 year old male here for follow up for hypertension. He has been checking his pressures at home. Last night it was 179/64. He has been taking Norvasc and Lisinopril/HCTZ on a regular basis. No headaches but every now and then right eye pulsates. He is feeling drowsy since starting the medicine. He has taken his medicine @ 5 am this morning. Edema is a problem. Has definitely improved and lost 5 lbs of water fluid. All labs reviewed with him and his daughters.   Review of Systems     Objective:   Physical Exam  Constitutional: He is oriented to person, place, and time. He appears well-developed and well-nourished. No distress.  HENT:  Head: Normocephalic.  Eyes: EOM are normal. Pupils are equal, round, and reactive to light.  Neck: Normal range of motion. Neck supple.  Cardiovascular: Normal rate, regular rhythm and normal heart sounds.   Pulmonary/Chest: Effort normal and breath sounds normal.  Musculoskeletal: Normal range of motion. He exhibits edema.  Neurological: He is alert and oriented to person, place, and time. He exhibits normal muscle tone. Coordination normal.  Psychiatric: He has a normal mood and affect. His behavior is normal. Judgment and thought content normal.     Results for orders placed in visit on 12/15/13  COMPREHENSIVE METABOLIC PANEL      Result Value Ref Range   Sodium 143  135 - 145 mEq/L   Potassium 3.6  3.5 - 5.3 mEq/L   Chloride 100  96 - 112 mEq/L   CO2 29  19 - 32 mEq/L   Glucose, Bld 72  70 - 99 mg/dL   BUN 20  6 - 23 mg/dL   Creat 1.30  0.50 - 1.35 mg/dL   Total Bilirubin 0.6  0.2 - 1.2 mg/dL   Alkaline Phosphatase 59  39 - 117 U/L   AST 24  0 - 37 U/L   ALT 29  0 - 53 U/L   Total Protein 7.5  6.0 - 8.3 g/dL   Albumin 4.6  3.5 - 5.2 g/dL   Calcium 9.7  8.4 - 10.5 mg/dL  LIPID PANEL      Result Value Ref Range   Cholesterol 105  0 - 200  mg/dL   Triglycerides 95  <150 mg/dL   HDL 35 (*) >39 mg/dL   Total CHOL/HDL Ratio 3.0     VLDL 19  0 - 40 mg/dL   LDL Cholesterol 51  0 - 99 mg/dL  TSH      Result Value Ref Range   TSH 5.219 (*) 0.350 - 4.500 uIU/mL  PSA, MEDICARE      Result Value Ref Range   PSA 1.46  <=4.00 ng/mL  POCT CBC      Result Value Ref Range   WBC 6.8  4.6 - 10.2 K/uL   Lymph, poc 1.9  0.6 - 3.4   POC LYMPH PERCENT 28.1  10 - 50 %L   MID (cbc) 0.5  0 - 0.9   POC MID % 7.3  0 - 12 %M   POC Granulocyte 4.4  2 - 6.9   Granulocyte percent 64.6  37 - 80 %G   RBC 5.25  4.69 - 6.13 M/uL   Hemoglobin 14.5  14.1 - 18.1 g/dL   HCT, POC 44.8  43.5 - 53.7 %   MCV 85.4  80 - 97 fL   MCH, POC 27.7  27 - 31.2 pg   MCHC 32.4  31.8 - 35.4 g/dL   RDW, POC 14.3     Platelet Count, POC 162  142 - 424 K/uL   MPV 7.7  0 - 99.8 fL  GLUCOSE, POCT (MANUAL RESULT ENTRY)      Result Value Ref Range   POC Glucose 80  70 - 99 mg/dl  POCT GLYCOSYLATED HEMOGLOBIN (HGB A1C)      Result Value Ref Range   Hemoglobin A1C 5.8    POCT UA - MICROSCOPIC ONLY      Result Value Ref Range   WBC, Ur, HPF, POC 0-3     RBC, urine, microscopic neg     Bacteria, U Microscopic neg     Mucus, UA neg     Epithelial cells, urine per micros 0-4     Crystals, Ur, HPF, POC neg     Casts, Ur, LPF, POC neg     Yeast, UA neg    POCT URINALYSIS DIPSTICK      Result Value Ref Range   Color, UA yellow     Clarity, UA clear     Glucose, UA neg     Bilirubin, UA neg     Ketones, UA neg     Spec Grav, UA 1.015     Blood, UA neg     pH, UA 6.0     Protein, UA trace     Urobilinogen, UA 0.2     Nitrite, UA neg     Leukocytes, UA Negative     161/76     Assessment & Plan:  Stop HCTZ HTN improving TSH mild elevation/Repeat level one month Continue amlodipine and Lisinopril hctz RTC one month Dash diet/exercise and lose more weight

## 2014-01-19 ENCOUNTER — Ambulatory Visit: Payer: Medicare Other | Admitting: Family Medicine

## 2014-01-28 ENCOUNTER — Encounter: Payer: Self-pay | Admitting: Family Medicine

## 2014-01-28 ENCOUNTER — Other Ambulatory Visit: Payer: Self-pay | Admitting: Family Medicine

## 2014-01-28 ENCOUNTER — Ambulatory Visit (INDEPENDENT_AMBULATORY_CARE_PROVIDER_SITE_OTHER): Payer: Medicare Other | Admitting: Family Medicine

## 2014-01-28 VITALS — BP 154/82 | HR 81 | Temp 99.0°F | Resp 16 | Ht 69.0 in | Wt 229.0 lb

## 2014-01-28 DIAGNOSIS — H269 Unspecified cataract: Secondary | ICD-10-CM

## 2014-01-28 DIAGNOSIS — Z23 Encounter for immunization: Secondary | ICD-10-CM | POA: Diagnosis not present

## 2014-01-28 DIAGNOSIS — R945 Abnormal results of liver function studies: Secondary | ICD-10-CM | POA: Diagnosis not present

## 2014-01-28 DIAGNOSIS — R7989 Other specified abnormal findings of blood chemistry: Secondary | ICD-10-CM | POA: Diagnosis not present

## 2014-01-28 DIAGNOSIS — I10 Essential (primary) hypertension: Secondary | ICD-10-CM | POA: Diagnosis not present

## 2014-01-28 NOTE — Progress Notes (Signed)
Subjective:    Patient ID: Daniel [name], male    DOB: 06-25-1940, 73 y.o.   MRN: [id number]  This chart was scribed for Wardell Honour, MD by Edison Simon, ED Scribe. This patient was seen in room 22 and the patient's care was started at 3:39 PM.   01/28/2014  Follow-up   HPI HPI Comments: Daniel [name] is a 73 y.o. male who presents to the [facility] for 1 month follow up for hypertension, at which time he saw Dr. Elder Cyphers. His TSH measured at 5 , borderline abnormal at that time. He has lost 12 lb in the past month. In the past 2 weeks, his blood pressures have ranged from 127/67 to 168/72; it measures here at 154/82. He states he was supposed to have cataract surgery in his right eye by Dr. [name] but they could not do the surgery because his blood pressure measured at 207 there. He denies noticing any symptoms from hypertension. He states he was not previously diagnosed with hypertension. He is taking hydrochlorothiazide and Lisinopril and Amlodipine. He reports frequent urination and drowsiness right after taking it. He also reports taking a dose of ASA in the morning. He reports sometimes feeling his heart skip beats but denies tachycardia. He denies chest pain and states his leg swelling has improved. He denies orthopnea, or bowel abnormalities.  He states he lives with his daughter and her 2 sons. He states he has 3 daughters, 7 grandchildren, and 1 great grandchild. He states he retired at 68. 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 He states he used to smoke 20 years ago, drinks lightly, and does not exercise besides yardwork.   He states his last colonoscopy was 9 years ago and was told to return for another in 10 years.  He states he has not had a pneumonia shot.   Review of Systems  Constitutional: Negative for fever, chills, diaphoresis, activity change, appetite change and fatigue.  Eyes: Positive for visual disturbance.    Respiratory: Negative for cough and shortness of breath.        Denies orthopnea  Cardiovascular: Positive for palpitations (skipping beats only) and leg swelling (improved). Negative for chest pain.       Blood pressure measured at 164/70 and 168/78 manually  Gastrointestinal: Negative for diarrhea, constipation and blood in stool.  Endocrine: Negative for cold intolerance, heat intolerance, polydipsia, polyphagia and polyuria.  Neurological: Negative for dizziness, tremors, seizures, syncope, facial asymmetry, speech difficulty, weakness, light-headedness, numbness and headaches.    Past Medical History  Diagnosis Date  . Hypertension   . Glaucoma    History reviewed. No pertinent past surgical history. No Known Allergies Current Outpatient Prescriptions  Medication Sig Dispense Refill  . amLODipine (NORVASC) 10 MG tablet Take 1 tablet (10 mg total) by mouth daily.  90 tablet  3  . lisinopril-hydrochlorothiazide (PRINZIDE,ZESTORETIC) 20-25 MG per tablet Take 1 tablet by mouth daily.  90 tablet  3  . hydrochlorothiazide (HYDRODIURIL) 25 MG tablet Take 1 tablet (25 mg total) by mouth daily.  30 tablet  0  . lisinopril (PRINIVIL,ZESTRIL) 10 MG tablet Take 1 tablet (10 mg total) by mouth daily.  30 tablet  0   No current facility-administered medications for this visit.   History   Social History  . Marital Status: Married    Spouse Name: N/A    Number of Children: N/A  . Years of Education: N/A   Occupational  History  . Not on file.   Social History Main Topics  . Smoking status: Never Smoker   . Smokeless tobacco: Not on file  . Alcohol Use: Not on file  . Drug Use: Not on file  . Sexual Activity: Not on file   Other Topics Concern  . Not on file   Social History Narrative   Marital status: widowed.      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       Tobacco:  Quit smoking 30 years ago.      Alcohol: weekends.      Exercise: sporadic; cuts grass and trims hedges.     History reviewed. No pertinent family history.      Objective:    BP 154/82  Pulse 81  Temp(Src) 99 F (37.2 C) (Oral)  Resp 16  Ht 5\' 9"  (1.753 m)  Wt 229 lb (103.874 kg)  BMI 33.80 kg/m2  SpO2 95% Physical Exam  Nursing note and vitals reviewed. Constitutional: He is oriented to person, place, and time. He appears well-developed and well-nourished. No distress.  HENT:  Head: Normocephalic and atraumatic.  Right Ear: External ear normal.  Left Ear: External ear normal.  Nose: Nose normal.  Mouth/Throat: Oropharynx is clear and moist.  Eyes: Conjunctivae and EOM are normal. Pupils are equal, round, and reactive to light.  Neck: Normal range of motion. Neck supple. Carotid bruit is not present. No tracheal deviation present. No thyromegaly present.  No carotid bruit  Cardiovascular: Normal rate, regular rhythm, normal heart sounds and intact distal pulses.  Exam reveals no gallop and no friction rub.   No murmur heard. Trace to 1+ pitting edema BLE to mid-shin.  Pulmonary/Chest: Effort normal and breath sounds normal. No respiratory distress. He has no wheezes. He has no rales.  Abdominal: Soft. Bowel sounds are normal. He exhibits no distension and no mass. There is no tenderness. There is no rebound and no guarding.  Large ventral hernia  Musculoskeletal: Normal range of motion. He exhibits edema.  Lymphadenopathy:    He has no cervical adenopathy.  Neurological: He is alert and oriented to person, place, and time. No cranial nerve deficit.  Skin: Skin is warm and dry. No rash noted. He is not diaphoretic.  Psychiatric: He has a normal mood and affect. His behavior is normal.   Results for orders placed in visit on 01/28/14  TSH      Result Value Ref Range   TSH 2.819  0.350 - 4.500 uIU/mL  COMPLETE METABOLIC PANEL WITH GFR       Result Value Ref Range   Sodium 141  135 - 145 mEq/L   Potassium 3.4 (*) 3.5 - 5.3 mEq/L   Chloride 101  96 - 112 mEq/L   CO2 31  19 - 32 mEq/L   Glucose, Bld 102 (*) 70 - 99 mg/dL   BUN 19  6 - 23 mg/dL   Creat 1.43 (*) 0.50 - 1.35 mg/dL   Total Bilirubin 0.6  0.2 - 1.2 mg/dL   Alkaline Phosphatase 61  39 - 117 U/L   AST 34  0 - 37 U/L   ALT 62 (*) 0 - 53 U/L   Total Protein 7.5  6.0 - 8.3 g/dL   Albumin 4.4  3.5 - 5.2 g/dL   Calcium 9.2  8.4 - 10.5 mg/dL   GFR, Est African American 56 (*)    GFR, Est Non African American 49 (*)    PREVNAR-13 AND INFLUENZA VACCINE ADMINISTERED.    Assessment & Plan:   1. Essential hypertension, benign   2. Abnormal TSH   3. Cataract   4. Need for prophylactic vaccination and inoculation against influenza   5. Need for prophylactic vaccination against Streptococcus pneumoniae (pneumococcus)     1. HTN: improved with current regimen; obtain labs; no changes to therapy at this time. 2.  Abnormal TSH: new. Repeat today. 3.  Cataracts: persistent ;clearance for cataract surgery now that blood pressure stable. 4. s /p Prevnar-13. 5.  S/p influenza vaccine.   No orders of the defined types were placed in this encounter.    Return in about 3 months (around 04/29/2014) for recheck high blood pressure.    I personally performed the services described in this documentation, which was scribed in my presence.  The recorded information has been reviewed and is accurate.  Reginia Forts, M.D.  [facility] [address] [place], [place]  [address] [phone] phone [phone] fax

## 2014-01-29 LAB — TSH: TSH: 2.819 u[IU]/mL (ref 0.350–4.500)

## 2014-01-29 LAB — COMPLETE METABOLIC PANEL WITH GFR
ALT: 62 U/L — AB (ref 0–53)
AST: 34 U/L (ref 0–37)
Albumin: 4.4 g/dL (ref 3.5–5.2)
Alkaline Phosphatase: 61 U/L (ref 39–117)
BILIRUBIN TOTAL: 0.6 mg/dL (ref 0.2–1.2)
BUN: 19 mg/dL (ref 6–23)
CO2: 31 mEq/L (ref 19–32)
Calcium: 9.2 mg/dL (ref 8.4–10.5)
Chloride: 101 mEq/L (ref 96–112)
Creat: 1.43 mg/dL — ABNORMAL HIGH (ref 0.50–1.35)
GFR, EST NON AFRICAN AMERICAN: 49 mL/min — AB
GFR, Est African American: 56 mL/min — ABNORMAL LOW
Glucose, Bld: 102 mg/dL — ABNORMAL HIGH (ref 70–99)
Potassium: 3.4 mEq/L — ABNORMAL LOW (ref 3.5–5.3)
SODIUM: 141 meq/L (ref 135–145)
TOTAL PROTEIN: 7.5 g/dL (ref 6.0–8.3)

## 2014-02-03 LAB — HEPATITIS PANEL, ACUTE
HCV Ab: NEGATIVE
HEP A IGM: NONREACTIVE
Hep B C IgM: NONREACTIVE
Hepatitis B Surface Ag: NEGATIVE

## 2014-02-05 DIAGNOSIS — IMO0002 Reserved for concepts with insufficient information to code with codable children: Secondary | ICD-10-CM | POA: Diagnosis not present

## 2014-02-05 DIAGNOSIS — H2589 Other age-related cataract: Secondary | ICD-10-CM | POA: Diagnosis not present

## 2014-02-16 DIAGNOSIS — H2522 Age-related cataract, morgagnian type, left eye: Secondary | ICD-10-CM | POA: Diagnosis not present

## 2014-02-16 DIAGNOSIS — H2511 Age-related nuclear cataract, right eye: Secondary | ICD-10-CM | POA: Diagnosis not present

## 2014-02-24 DIAGNOSIS — H2512 Age-related nuclear cataract, left eye: Secondary | ICD-10-CM | POA: Diagnosis not present

## 2014-03-02 DIAGNOSIS — H25011 Cortical age-related cataract, right eye: Secondary | ICD-10-CM | POA: Diagnosis not present

## 2014-03-02 DIAGNOSIS — H2511 Age-related nuclear cataract, right eye: Secondary | ICD-10-CM | POA: Diagnosis not present

## 2014-03-02 DIAGNOSIS — H25041 Posterior subcapsular polar age-related cataract, right eye: Secondary | ICD-10-CM | POA: Diagnosis not present

## 2014-03-02 DIAGNOSIS — H2512 Age-related nuclear cataract, left eye: Secondary | ICD-10-CM | POA: Diagnosis not present

## 2014-03-23 ENCOUNTER — Encounter: Payer: Self-pay | Admitting: Gastroenterology

## 2014-03-23 ENCOUNTER — Encounter: Payer: Self-pay | Admitting: Internal Medicine

## 2014-04-29 ENCOUNTER — Encounter: Payer: Self-pay | Admitting: Family Medicine

## 2014-04-29 ENCOUNTER — Ambulatory Visit (INDEPENDENT_AMBULATORY_CARE_PROVIDER_SITE_OTHER): Payer: Medicare Other | Admitting: Family Medicine

## 2014-04-29 VITALS — BP 140/80 | HR 83 | Temp 98.1°F | Resp 16 | Ht 69.0 in | Wt 234.6 lb

## 2014-04-29 DIAGNOSIS — I10 Essential (primary) hypertension: Secondary | ICD-10-CM

## 2014-04-29 DIAGNOSIS — R7989 Other specified abnormal findings of blood chemistry: Secondary | ICD-10-CM

## 2014-04-29 DIAGNOSIS — R945 Abnormal results of liver function studies: Secondary | ICD-10-CM

## 2014-04-29 NOTE — Patient Instructions (Signed)
Exercise improves every system in the body.  It lowers the risk of heart disease, decreases blood pressure, reduces the symptoms of depression and anxiety, and lowers blood sugar. To receive these benefits, try to get 150 minutes of planned exercise each week.  You can break this 150 minutes up however you like.  For instance, you can perform 30 minutes of brisk walking 5 days a week, or perform 50 minutes 3 days a week.  If you don't like walking, or can't find a safe place to walk, find another way to move that you can enjoy.  Exercise tapes, cycling, stair climbing, swimming, or a combination will be just as good as a walking program. To ensure the proper intensity, you can use the talk test. Essentially, you should be able to carry on a conversation, but you should have to take short breaks from the conversation in order catch your breath.

## 2014-04-29 NOTE — Progress Notes (Signed)
    IDENTIFYING INFORMATION  Daniel [name] / DOB: 11-21-1940 / MRN: [id number]  The patient has HTN (hypertension) on his problem list.  SUBJECTIVE  CC: Hypertension   HPI: Daniel [name] is a 73 y.o. y.o. male presenting for a follow   He  has a past medical history of Hypertension and Glaucoma.    He has a current medication list which includes the following prescription(s): amlodipine and lisinopril-hydrochlorothiazide.  Daniel [name] has No Known Allergies. He  reports that he has never smoked. He does not have any smokeless tobacco history on file. He  has no sexual activity history on file.  The patient  has no past surgical history on file.  His family history is not on file.  Review of Systems  Constitutional: Negative for malaise/fatigue.  Respiratory: Negative for cough and shortness of breath.   Cardiovascular: Negative for chest pain, orthopnea, leg swelling and PND.  Gastrointestinal: Negative.   Skin: Negative.   Neurological: Negative for dizziness and headaches.    OBJECTIVE  Blood pressure 140/80, pulse 83, temperature 98.1 F (36.7 C), temperature source Oral, resp. rate 16, height 5\' 9"  (1.753 m), weight 234 lb 9.6 oz (106.414 kg), SpO2 97 %. The patient's body mass index is 34.63 kg/(m^2).  Physical Exam  Constitutional: He is oriented to person, place, and time. He appears well-developed and well-nourished. No distress.  Eyes: No scleral icterus.  Neck: Normal range of motion.  Cardiovascular: Normal rate, regular rhythm, normal heart sounds and intact distal pulses.   Respiratory: Effort normal and breath sounds normal. He has no wheezes. He has no rales.  Lymphadenopathy:    He has no cervical adenopathy.  Neurological: He is alert and oriented to person, place, and time.  Skin: Skin is warm and dry. He is not diaphoretic.  Psychiatric: He has a normal mood and affect. His behavior is normal. Judgment and thought content normal.    No results found  for this or any previous visit (from the past 24 hour(s)).  ASSESSMENT & PLAN  Daniel [name] was seen today for hypertension.  Diagnoses and associated orders for this visit:  Essential hypertension, benign - COMPLETE METABOLIC PANEL WITH GFR -     Controlled, continue current medication regimen.    Elevated liver function tests - COMPLETE METABOLIC PANEL WITH GFR -     Negative for Hepatitis A, B, C.     The patient was instructed to to call or comeback to clinic as needed, or should symptoms warrant.  Philis Fendt, MHS, PA-C [facility] 04/29/2014 3:34 PM

## 2014-04-30 LAB — COMPLETE METABOLIC PANEL WITH GFR
ALT: 26 U/L (ref 0–53)
AST: 18 U/L (ref 0–37)
Albumin: 4.3 g/dL (ref 3.5–5.2)
Alkaline Phosphatase: 63 U/L (ref 39–117)
BUN: 23 mg/dL (ref 6–23)
CALCIUM: 9.5 mg/dL (ref 8.4–10.5)
CO2: 30 mEq/L (ref 19–32)
CREATININE: 1.46 mg/dL — AB (ref 0.50–1.35)
Chloride: 102 mEq/L (ref 96–112)
GFR, Est African American: 55 mL/min — ABNORMAL LOW
GFR, Est Non African American: 47 mL/min — ABNORMAL LOW
Glucose, Bld: 79 mg/dL (ref 70–99)
POTASSIUM: 3.7 meq/L (ref 3.5–5.3)
Sodium: 144 mEq/L (ref 135–145)
Total Bilirubin: 0.5 mg/dL (ref 0.2–1.2)
Total Protein: 7.7 g/dL (ref 6.0–8.3)

## 2014-05-14 ENCOUNTER — Encounter: Payer: Self-pay | Admitting: Radiology

## 2014-08-29 NOTE — Progress Notes (Signed)
Subjective:    Patient ID: Pilar Plate, male    DOB: 13-Nov-1940, 73 y.o.   MRN: [id number]  04/29/2014  Hypertension   HPI This 74 y.o. male presents for three month follow-up of the following:  1. HTN: three month follow-up. Patient reports good compliance with medication, good tolerance to medication, and good symptom control.   Denies CP/palp/SOB/leg swelling.  2. Elevated LFTs:  Due for repeat.  No regular alcohol intake; no regular tylenol ingestion.  3.  Elevated TSH: repeat was normal.    Review of Systems  Constitutional: Negative for fever, chills, diaphoresis, activity change, appetite change and fatigue.  Eyes: Negative for visual disturbance.  Respiratory: Negative for cough and shortness of breath.   Cardiovascular: Negative for chest pain, palpitations and leg swelling.  Endocrine: Negative for cold intolerance, heat intolerance, polydipsia, polyphagia and polyuria.  Neurological: Negative for dizziness, tremors, seizures, syncope, facial asymmetry, speech difficulty, weakness, light-headedness, numbness and headaches.    Past Medical History  Diagnosis Date  . Hypertension   . Glaucoma    Past Surgical History  Procedure Laterality Date  . Eye surgery      B cataract surgery. [name].   No Known Allergies Current Outpatient Prescriptions  Medication Sig Dispense Refill  . amLODipine (NORVASC) 10 MG tablet Take 1 tablet (10 mg total) by mouth daily. 90 tablet 3  . lisinopril-hydrochlorothiazide (PRINZIDE,ZESTORETIC) 20-25 MG per tablet Take 1 tablet by mouth daily. 90 tablet 3   No current facility-administered medications for this visit.       Objective:    BP 140/80 mmHg  Pulse 83  Temp(Src) 98.1 F (36.7 C) (Oral)  Resp 16  Ht 5\' 9"  (1.753 m)  Wt 234 lb 9.6 oz (106.414 kg)  BMI 34.63 kg/m2  SpO2 97% Physical Exam  Constitutional: He is oriented to person, place, and time. He appears well-developed and well-nourished. No distress.    HENT:  Head: Normocephalic and atraumatic.  Right Ear: External ear normal.  Left Ear: External ear normal.  Nose: Nose normal.  Mouth/Throat: Oropharynx is clear and moist.  Eyes: Conjunctivae and EOM are normal. Pupils are equal, round, and reactive to light.  Neck: Normal range of motion. Neck supple. Carotid bruit is not present. No thyromegaly present.  Cardiovascular: Normal rate, regular rhythm, normal heart sounds and intact distal pulses.  Exam reveals no gallop and no friction rub.   No murmur heard. Pulmonary/Chest: Effort normal and breath sounds normal. He has no wheezes. He has no rales.  Abdominal: Soft. Bowel sounds are normal. He exhibits no distension and no mass. There is no tenderness. There is no rebound and no guarding.  Lymphadenopathy:    He has no cervical adenopathy.  Neurological: He is alert and oriented to person, place, and time. No cranial nerve deficit.  Skin: Skin is warm and dry. No rash noted. He is not diaphoretic.  Psychiatric: He has a normal mood and affect. His behavior is normal.  Nursing note and vitals reviewed.       Assessment & Plan   1. Essential hypertension, benign   2. Elevated liver function tests     1. HTN: controlled; obtain labs; continue current medications. RTC six months. 2.  Elevated LFTs: New.  Repeat today.  No regular alcohol or Tylenol ingestion.   No orders of the defined types were placed in this encounter.    Return in about 6 months (around 10/29/2014) for recheck high blood pressure.  Kristi Elayne Guerin, M.D. [facility] [address] [place], [place]  [address] [phone] phone [phone] fax

## 2014-09-26 ENCOUNTER — Telehealth: Payer: Self-pay | Admitting: Family Medicine

## 2014-09-26 NOTE — Telephone Encounter (Signed)
Patient doesn't have a phone i spoke with his daughter and made the appt she will pass it on to him she sees him everyday

## 2014-10-28 ENCOUNTER — Ambulatory Visit: Payer: Medicare Other | Admitting: Family Medicine

## 2014-12-04 ENCOUNTER — Ambulatory Visit (INDEPENDENT_AMBULATORY_CARE_PROVIDER_SITE_OTHER): Payer: Medicare Other | Admitting: Family Medicine

## 2014-12-04 ENCOUNTER — Ambulatory Visit (INDEPENDENT_AMBULATORY_CARE_PROVIDER_SITE_OTHER): Payer: Medicare Other

## 2014-12-04 ENCOUNTER — Encounter: Payer: Self-pay | Admitting: Family Medicine

## 2014-12-04 VITALS — BP 128/74 | HR 75 | Temp 98.1°F | Resp 16 | Ht 69.0 in | Wt 226.8 lb

## 2014-12-04 DIAGNOSIS — R0989 Other specified symptoms and signs involving the circulatory and respiratory systems: Secondary | ICD-10-CM | POA: Diagnosis not present

## 2014-12-04 DIAGNOSIS — J301 Allergic rhinitis due to pollen: Secondary | ICD-10-CM | POA: Diagnosis not present

## 2014-12-04 DIAGNOSIS — R059 Cough, unspecified: Secondary | ICD-10-CM

## 2014-12-04 DIAGNOSIS — Z0000 Encounter for general adult medical examination without abnormal findings: Secondary | ICD-10-CM | POA: Diagnosis not present

## 2014-12-04 DIAGNOSIS — Z1211 Encounter for screening for malignant neoplasm of colon: Secondary | ICD-10-CM | POA: Diagnosis not present

## 2014-12-04 DIAGNOSIS — N289 Disorder of kidney and ureter, unspecified: Secondary | ICD-10-CM

## 2014-12-04 DIAGNOSIS — Z5181 Encounter for therapeutic drug level monitoring: Secondary | ICD-10-CM | POA: Diagnosis not present

## 2014-12-04 DIAGNOSIS — R05 Cough: Secondary | ICD-10-CM

## 2014-12-04 DIAGNOSIS — E669 Obesity, unspecified: Secondary | ICD-10-CM

## 2014-12-04 DIAGNOSIS — Z6833 Body mass index (BMI) 33.0-33.9, adult: Secondary | ICD-10-CM

## 2014-12-04 DIAGNOSIS — Z23 Encounter for immunization: Secondary | ICD-10-CM | POA: Diagnosis not present

## 2014-12-04 DIAGNOSIS — R609 Edema, unspecified: Secondary | ICD-10-CM | POA: Diagnosis not present

## 2014-12-04 DIAGNOSIS — I10 Essential (primary) hypertension: Secondary | ICD-10-CM

## 2014-12-04 DIAGNOSIS — R7989 Other specified abnormal findings of blood chemistry: Secondary | ICD-10-CM | POA: Diagnosis not present

## 2014-12-04 DIAGNOSIS — I517 Cardiomegaly: Secondary | ICD-10-CM

## 2014-12-04 DIAGNOSIS — R0689 Other abnormalities of breathing: Secondary | ICD-10-CM

## 2014-12-04 DIAGNOSIS — Z125 Encounter for screening for malignant neoplasm of prostate: Secondary | ICD-10-CM | POA: Diagnosis not present

## 2014-12-04 DIAGNOSIS — R945 Abnormal results of liver function studies: Secondary | ICD-10-CM

## 2014-12-04 LAB — POCT URINALYSIS DIPSTICK
BILIRUBIN UA: NEGATIVE
Blood, UA: NEGATIVE
GLUCOSE UA: NEGATIVE
KETONES UA: NEGATIVE
LEUKOCYTES UA: NEGATIVE
NITRITE UA: NEGATIVE
PROTEIN UA: NEGATIVE
SPEC GRAV UA: 1.025
Urobilinogen, UA: 0.2
pH, UA: 5.5

## 2014-12-04 LAB — CBC WITH DIFFERENTIAL/PLATELET
Basophils Absolute: 0 10*3/uL (ref 0.0–0.1)
Basophils Relative: 0 % (ref 0–1)
EOS ABS: 0.1 10*3/uL (ref 0.0–0.7)
Eosinophils Relative: 1 % (ref 0–5)
HCT: 41.9 % (ref 39.0–52.0)
Hemoglobin: 14.3 g/dL (ref 13.0–17.0)
Lymphocytes Relative: 28 % (ref 12–46)
Lymphs Abs: 2.3 10*3/uL (ref 0.7–4.0)
MCH: 28.4 pg (ref 26.0–34.0)
MCHC: 34.1 g/dL (ref 30.0–36.0)
MCV: 83.3 fL (ref 78.0–100.0)
MONO ABS: 0.6 10*3/uL (ref 0.1–1.0)
MPV: 10.1 fL (ref 8.6–12.4)
Monocytes Relative: 7 % (ref 3–12)
Neutro Abs: 5.3 10*3/uL (ref 1.7–7.7)
Neutrophils Relative %: 64 % (ref 43–77)
Platelets: 216 10*3/uL (ref 150–400)
RBC: 5.03 MIL/uL (ref 4.22–5.81)
RDW: 14.4 % (ref 11.5–15.5)
WBC: 8.3 10*3/uL (ref 4.0–10.5)

## 2014-12-04 LAB — COMPREHENSIVE METABOLIC PANEL
ALT: 17 U/L (ref 0–53)
AST: 17 U/L (ref 0–37)
Albumin: 4.5 g/dL (ref 3.5–5.2)
Alkaline Phosphatase: 54 U/L (ref 39–117)
BUN: 16 mg/dL (ref 6–23)
CHLORIDE: 104 meq/L (ref 96–112)
CO2: 29 meq/L (ref 19–32)
Calcium: 9.5 mg/dL (ref 8.4–10.5)
Creat: 1.52 mg/dL — ABNORMAL HIGH (ref 0.50–1.35)
Glucose, Bld: 83 mg/dL (ref 70–99)
Potassium: 3.5 mEq/L (ref 3.5–5.3)
SODIUM: 146 meq/L — AB (ref 135–145)
TOTAL PROTEIN: 7.8 g/dL (ref 6.0–8.3)
Total Bilirubin: 0.6 mg/dL (ref 0.2–1.2)

## 2014-12-04 IMAGING — CR DG CHEST 2V
2 series · 2 of 2 positions shown · non-contrast
Comparison: Chest x-ray [DATE].

CLINICAL DATA: 74-year-old male with history of cough.

EXAM:
CHEST  2 VIEW

[PA]
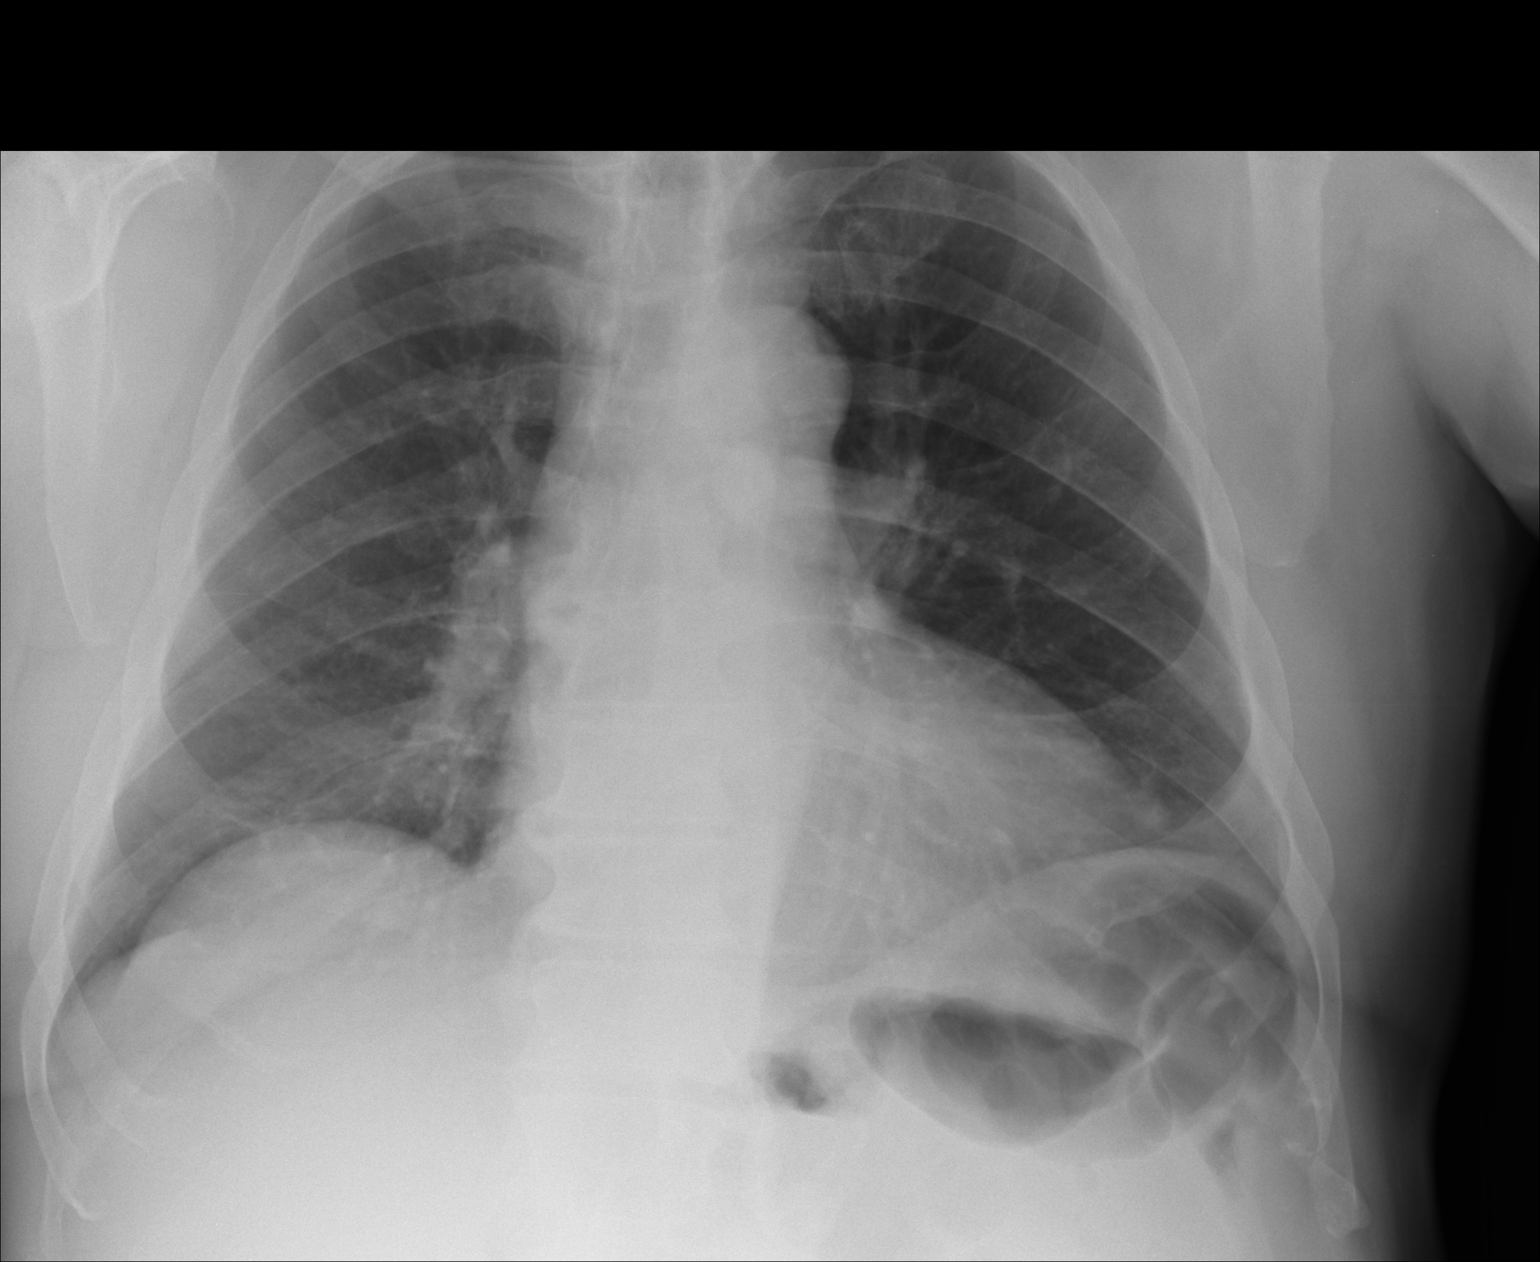

[lateral]
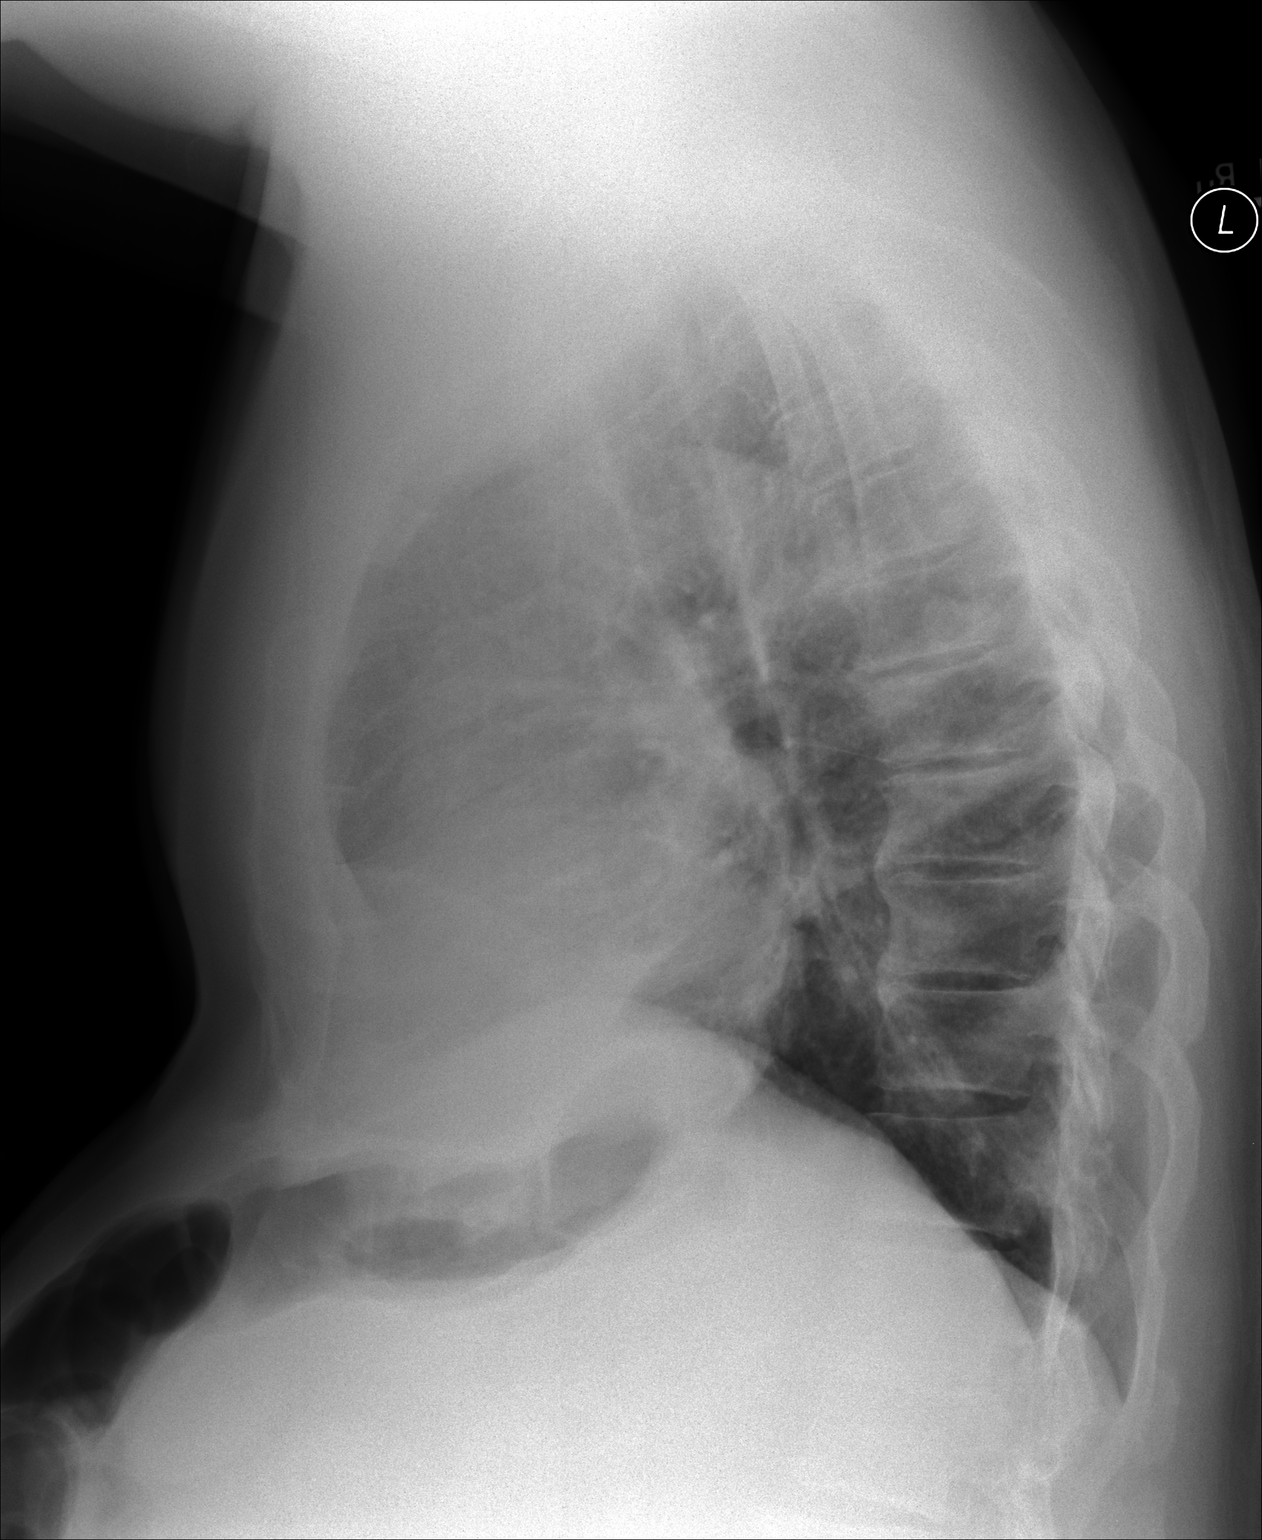

[2 of 2 positions shown; findings below may reference images not displayed]

FINDINGS: Lung volumes are normal. No consolidative airspace disease. No
pleural effusions. No pneumothorax. No pulmonary nodule or mass
noted. Pulmonary vasculature and the cardiomediastinal silhouette
are within normal limits.
IMPRESSION: No radiographic evidence of acute cardiopulmonary disease.

## 2014-12-04 MED ORDER — LISINOPRIL-HYDROCHLOROTHIAZIDE 20-25 MG PO TABS: 1.0000 | ORAL_TABLET | Freq: Every day | ORAL | Status: DC

## 2014-12-04 MED ORDER — AMLODIPINE BESYLATE 10 MG PO TABS: 10.0000 mg | ORAL_TABLET | Freq: Every day | ORAL | Status: DC

## 2014-12-04 MED ORDER — LORATADINE 10 MG PO TABS: 10.0000 mg | ORAL_TABLET | Freq: Every day | ORAL | Status: DC

## 2014-12-04 NOTE — Patient Instructions (Signed)

## 2014-12-04 NOTE — Progress Notes (Signed)
Subjective:    Patient ID: Daniel [name], male    DOB: 01-20-1941, 74 y.o.   MRN: [id number]  12/04/2014  Hypertension   HPI This 74 y.o. male presents for six month follow-up and Annual Wellness Examination.  Last physical:  Six years ago.   Colonoscopy:  2004; repeat in 5 years recommended. TDAP:  Not sure.  Years ago; while working. Pneumovax:  [id number] 2015 Zostavax: never; declines Influenza: 2015 Eye exam:  Just had one in 2016; no glaucoma; no cataracts.  S/p cataract surgery in 2015. Dental exam:  Overdue; tooth broke off on L upper gums.    1. HTN:  Patient reports good compliance with medication, good tolerance to medication, and good symptom control.  Suffers with cough at nighttime; walking; doing yard work.  Weight down since last visit; eating a lot of fruit; exercising some.  Checks BP at home every two days; 130/90, 132/87.  2. Elevated LFTs: improved/resolved at last visit.  3. Renal insufficiency: present at last two visits.   Review of Systems  Constitutional: Negative for fever, chills, diaphoresis, activity change, appetite change, fatigue and unexpected weight change.  HENT: Positive for congestion and rhinorrhea. Negative for dental problem, drooling, ear discharge, ear pain, facial swelling, hearing loss, mouth sores, nosebleeds, postnasal drip, sinus pressure, sneezing, sore throat, tinnitus, trouble swallowing and voice change.   Eyes: Negative for photophobia, pain, discharge, redness, itching and visual disturbance.  Respiratory: Positive for cough. Negative for apnea, choking, chest tightness, shortness of breath, wheezing and stridor.   Cardiovascular: Positive for leg swelling. Negative for chest pain and palpitations.  Gastrointestinal: Negative for nausea, vomiting, abdominal pain, diarrhea, constipation, blood in stool, abdominal distention, anal bleeding and rectal pain.  Endocrine: Negative for cold intolerance, heat intolerance, polydipsia,  polyphagia and polyuria.  Genitourinary: Negative for dysuria, urgency, frequency, hematuria, flank pain, decreased urine volume, discharge, penile swelling, scrotal swelling, enuresis, difficulty urinating, genital sores, penile pain and testicular pain.  Musculoskeletal: Negative for myalgias, back pain, joint swelling, arthralgias, gait problem, neck pain and neck stiffness.  Skin: Negative for color change, pallor, rash and wound.  Allergic/Immunologic: Negative for environmental allergies, food allergies and immunocompromised state.  Neurological: Negative for dizziness, tremors, seizures, syncope, facial asymmetry, speech difficulty, weakness, light-headedness, numbness and headaches.  Hematological: Negative for adenopathy. Does not bruise/bleed easily.  Psychiatric/Behavioral: Negative for suicidal ideas, hallucinations, behavioral problems, confusion, sleep disturbance, self-injury, dysphoric mood, decreased concentration and agitation. The patient is not nervous/anxious and is not hyperactive.     Past Medical History  Diagnosis Date  . Hypertension   . Glaucoma    Past Surgical History  Procedure Laterality Date  . Eye surgery      B cataract surgery. [name].   No Known Allergies Current Outpatient Prescriptions  Medication Sig Dispense Refill  . amLODipine (NORVASC) 10 MG tablet Take 1 tablet (10 mg total) by mouth daily. 90 tablet 3  . aspirin 81 MG tablet Take 81 mg by mouth daily.    Marland Kitchen lisinopril-hydrochlorothiazide (PRINZIDE,ZESTORETIC) 20-25 MG per tablet Take 1 tablet by mouth daily. 90 tablet 3  . loratadine (CLARITIN) 10 MG tablet Take 1 tablet (10 mg total) by mouth daily. 90 tablet 3   No current facility-administered medications for this visit.   History   Social History  . Marital Status: Married    Spouse Name: N/A  . Number of Children: N/A  . Years of Education: N/A   Occupational History  . Not on file.  Social History Main Topics  . Smoking  status: Never Smoker   . Smokeless tobacco: Not on file  . Alcohol Use: Not on file  . Drug Use: Not on file  . Sexual Activity: Not on file   Other Topics Concern  . Not on file   Social History Narrative   Marital status: widowed since 2010; married x 37 years. Not dating.      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       Tobacco:  Quit smoking 30 years ago.      Alcohol: weekends. Scotch.      Exercise: sporadic; cuts grass and trims hedges.        Seatbelt:  100%      Guns: none      ADLs: drives; independent ADLs; buys groceries; pays bills.  Cut grass.  NO cane or assistant devices.      Advanced Directives: yes; No CPR; DNI/DNR.              Family History  Problem Relation Age of Onset  . Diabetes Mother        Objective:    BP 128/74 mmHg  Pulse 75  Temp(Src) 98.1 F (36.7 C) (Oral)  Resp 16  Ht 5\' 9"  (1.753 m)  Wt 226 lb 12.8 oz (102.876 kg)  BMI 33.48 kg/m2  SpO2 97% Physical Exam  Constitutional: He is oriented to person, place, and time. He appears well-developed and well-nourished. No distress.  HENT:  Head: Normocephalic and atraumatic.  Right Ear: External ear normal.  Left Ear: External ear normal.  Nose: Nose normal.  Mouth/Throat: Oropharynx is clear and moist.  Eyes: Conjunctivae and EOM are normal. Pupils are equal, round, and reactive to light.  Neck: Normal range of motion. Neck supple. Carotid bruit is not present. No thyromegaly present.  Cardiovascular: Normal rate, regular rhythm, normal heart sounds and intact distal pulses.  Exam reveals no gallop and no friction rub.   No murmur heard. 1+ pitting edema BLE.  Pulmonary/Chest: Effort normal. No respiratory distress. He has wheezes. He has no rales.  Abdominal: Soft. Bowel sounds are normal. He exhibits no distension and no mass. There is no tenderness.  There is no rebound and no guarding.  Musculoskeletal:       Right shoulder: Normal.       Left shoulder: Normal.       Cervical back: Normal.  Lymphadenopathy:    He has no cervical adenopathy.  Neurological: He is alert and oriented to person, place, and time. He has normal reflexes. No cranial nerve deficit. He exhibits normal muscle tone. Coordination normal.  Skin: Skin is warm and dry. Rash noted. He is not diaphoretic.  Chronic venous stasis changes B lower extremities; dystrophic nails B feet diffusely.  Psychiatric: He has a normal mood and affect. His behavior is normal. Judgment and thought content normal.  Nursing note and vitals reviewed.  Results for orders placed or performed in visit on 04/29/14  COMPLETE METABOLIC PANEL WITH GFR  Result Value Ref Range   Sodium 144 135 - 145 mEq/L   Potassium 3.7 3.5 - 5.3 mEq/L   Chloride 102 96 - 112 mEq/L   CO2 30 19 - 32 mEq/L   Glucose, Bld 79 70 - 99 mg/dL   BUN 23 6 - 23 mg/dL  Creat 1.46 (H) 0.50 - 1.35 mg/dL   Total Bilirubin 0.5 0.2 - 1.2 mg/dL   Alkaline Phosphatase 63 39 - 117 U/L   AST 18 0 - 37 U/L   ALT 26 0 - 53 U/L   Total Protein 7.7 6.0 - 8.3 g/dL   Albumin 4.3 3.5 - 5.2 g/dL   Calcium 9.5 8.4 - 10.5 mg/dL   GFR, Est African American 55 (L) mL/min   GFR, Est Non African American 47 (L) mL/min   PNEUMOVAX ADMINISTERED.  [facility] reading (PRIMARY) by  Dr. Tamala Julian.  CXR: NAD      Assessment & Plan:   1. Medicare annual wellness visit, subsequent   2. Essential hypertension   3. Elevated LFTs   4. Renal insufficiency   5. Colon cancer screening   6. Cough   7. Allergic rhinitis due to pollen   8. Obesity   9. BMI 33.0-33.9,adult   10. Abnormal breath sounds   11. Prostate cancer screening   12. Medication monitoring encounter   13. Edema   14. LVH (left ventricular hypertrophy)   15. Need for prophylactic vaccination against Streptococcus pneumoniae (pneumococcus)     1. Annual Wellness Examination:  anticipatory guidance -- further weight loss, colonoscopy, exercise.  Bring in copy of Advanced Directives; DNR/DNI.  Refer for colonoscopy. Discussed immunizations --- s/p Pneumovax in office; pt declined Zostavax.  Independent with ADLs; no evidence of depression; low fall risk; no hearing loss; no urinary incontinence.   2.  HTN: controlled; obtain labs; refills provided; consider switching Lisinopril if cough does not improve with allergy treatment. 3.  Elevated LFTs; Resolved. 4.  Renal insufficiency: stable; obtain labs, u/a.  Avoid nephrotoxic medications. 5.  Colon cancer screening: refer to GI for colonoscopy. 6.  Cough: New.  Secondary to allergic rhinitis and bronchospasm; treat with Claritin.  Obtain CXR. 7.  Allergic Rhinitis: uncontrolled; treat with Claritin; likely cause of cough. 8.  Obesity: with ongoing weight loss; encourage further attempts at weight loss and exercise.   Meds ordered this encounter  Medications  . aspirin 81 MG tablet    Sig: Take 81 mg by mouth daily.  Marland Kitchen loratadine (CLARITIN) 10 MG tablet    Sig: Take 1 tablet (10 mg total) by mouth daily.    Dispense:  90 tablet    Refill:  3  . amLODipine (NORVASC) 10 MG tablet    Sig: Take 1 tablet (10 mg total) by mouth daily.    Dispense:  90 tablet    Refill:  3  . lisinopril-hydrochlorothiazide (PRINZIDE,ZESTORETIC) 20-25 MG per tablet    Sig: Take 1 tablet by mouth daily.    Dispense:  90 tablet    Refill:  3    No Follow-up on file.    Juhi Lagrange Elayne Guerin, M.D. [facility] [address] [place], [place]  [address] [phone] phone [phone] fax

## 2014-12-07 ENCOUNTER — Encounter: Payer: Self-pay | Admitting: Family Medicine

## 2015-01-08 ENCOUNTER — Encounter: Payer: Self-pay | Admitting: Family Medicine

## 2015-06-01 ENCOUNTER — Telehealth: Payer: Self-pay | Admitting: Family Medicine

## 2015-06-01 NOTE — Telephone Encounter (Signed)
UNABLE TO REACH PATIENT AT THIS TIME ABOUT HIS COLONOSCOPY

## 2015-06-07 ENCOUNTER — Ambulatory Visit: Payer: Self-pay | Admitting: Family Medicine

## 2015-12-14 ENCOUNTER — Other Ambulatory Visit: Payer: Self-pay | Admitting: Family Medicine

## 2015-12-14 DIAGNOSIS — I517 Cardiomegaly: Secondary | ICD-10-CM

## 2015-12-14 DIAGNOSIS — I10 Essential (primary) hypertension: Secondary | ICD-10-CM

## 2015-12-14 DIAGNOSIS — R609 Edema, unspecified: Secondary | ICD-10-CM

## 2016-01-13 ENCOUNTER — Other Ambulatory Visit: Payer: Self-pay | Admitting: Family Medicine

## 2016-01-13 DIAGNOSIS — I517 Cardiomegaly: Secondary | ICD-10-CM

## 2016-01-13 DIAGNOSIS — R609 Edema, unspecified: Secondary | ICD-10-CM

## 2016-01-13 DIAGNOSIS — I10 Essential (primary) hypertension: Secondary | ICD-10-CM

## 2016-01-13 MED ORDER — LISINOPRIL-HYDROCHLOROTHIAZIDE 20-25 MG PO TABS: 1.0000 | ORAL_TABLET | Freq: Every day | ORAL | 0 refills | Status: DC

## 2016-02-02 ENCOUNTER — Encounter: Payer: Medicare Other | Admitting: Family Medicine

## 2016-02-18 ENCOUNTER — Other Ambulatory Visit: Payer: Self-pay | Admitting: Urgent Care

## 2016-02-18 DIAGNOSIS — I10 Essential (primary) hypertension: Secondary | ICD-10-CM

## 2016-02-18 DIAGNOSIS — I517 Cardiomegaly: Secondary | ICD-10-CM

## 2016-02-25 ENCOUNTER — Encounter: Payer: Self-pay | Admitting: Family Medicine

## 2016-02-25 ENCOUNTER — Ambulatory Visit (INDEPENDENT_AMBULATORY_CARE_PROVIDER_SITE_OTHER): Payer: Medicare Other | Admitting: Family Medicine

## 2016-02-25 VITALS — BP 134/76 | HR 88 | Temp 98.6°F | Resp 16 | Ht 69.0 in | Wt 238.8 lb

## 2016-02-25 DIAGNOSIS — R601 Generalized edema: Secondary | ICD-10-CM

## 2016-02-25 DIAGNOSIS — I10 Essential (primary) hypertension: Secondary | ICD-10-CM

## 2016-02-25 DIAGNOSIS — I517 Cardiomegaly: Secondary | ICD-10-CM

## 2016-02-25 DIAGNOSIS — R351 Nocturia: Secondary | ICD-10-CM

## 2016-02-25 DIAGNOSIS — N289 Disorder of kidney and ureter, unspecified: Secondary | ICD-10-CM | POA: Diagnosis not present

## 2016-02-25 LAB — POCT URINALYSIS DIP (MANUAL ENTRY)
BILIRUBIN UA: NEGATIVE
BILIRUBIN UA: NEGATIVE
Blood, UA: NEGATIVE
Glucose, UA: NEGATIVE
Leukocytes, UA: NEGATIVE
Nitrite, UA: NEGATIVE
PH UA: 7
SPEC GRAV UA: 1.02
Urobilinogen, UA: 1

## 2016-02-25 LAB — CBC WITH DIFFERENTIAL/PLATELET
BASOS ABS: 0 {cells}/uL (ref 0–200)
BASOS PCT: 0 %
EOS PCT: 1 %
Eosinophils Absolute: 68 cells/uL (ref 15–500)
HCT: 43.7 % (ref 38.5–50.0)
Hemoglobin: 14.9 g/dL (ref 13.2–17.1)
LYMPHS ABS: 1972 {cells}/uL (ref 850–3900)
Lymphocytes Relative: 29 %
MCH: 29.6 pg (ref 27.0–33.0)
MCHC: 34.1 g/dL (ref 32.0–36.0)
MCV: 86.7 fL (ref 80.0–100.0)
MONO ABS: 544 {cells}/uL (ref 200–950)
MONOS PCT: 8 %
MPV: 10.2 fL (ref 7.5–12.5)
Neutro Abs: 4216 cells/uL (ref 1500–7800)
Neutrophils Relative %: 62 %
PLATELETS: 175 10*3/uL (ref 140–400)
RBC: 5.04 MIL/uL (ref 4.20–5.80)
RDW: 14.3 % (ref 11.0–15.0)
WBC: 6.8 10*3/uL (ref 3.8–10.8)

## 2016-02-25 LAB — COMPREHENSIVE METABOLIC PANEL
ALK PHOS: 48 U/L (ref 40–115)
ALT: 27 U/L (ref 9–46)
AST: 22 U/L (ref 10–35)
Albumin: 4.4 g/dL (ref 3.6–5.1)
BUN: 17 mg/dL (ref 7–25)
CO2: 29 mmol/L (ref 20–31)
CREATININE: 1.47 mg/dL — AB (ref 0.70–1.18)
Calcium: 9.6 mg/dL (ref 8.6–10.3)
Chloride: 103 mmol/L (ref 98–110)
GLUCOSE: 74 mg/dL (ref 65–99)
Potassium: 4 mmol/L (ref 3.5–5.3)
SODIUM: 143 mmol/L (ref 135–146)
Total Bilirubin: 0.6 mg/dL (ref 0.2–1.2)
Total Protein: 7.6 g/dL (ref 6.1–8.1)

## 2016-02-25 MED ORDER — LISINOPRIL-HYDROCHLOROTHIAZIDE 20-25 MG PO TABS: 1.0000 | ORAL_TABLET | Freq: Every day | ORAL | 1 refills | Status: DC

## 2016-02-25 MED ORDER — AMLODIPINE BESYLATE 10 MG PO TABS: 10.0000 mg | ORAL_TABLET | Freq: Every day | ORAL | 1 refills | Status: DC

## 2016-02-25 NOTE — Patient Instructions (Addendum)
IF you received an x-ray today, you will receive an invoice from [facility]. Please contact [facility] at [phone] with questions or concerns regarding your invoice.   IF you received labwork today, you will receive an invoice from [employer]. Please contact [facility] at [phone] with questions or concerns regarding your invoice.   Our billing staff will not be able to assist you with questions regarding bills from these companies.  You will be contacted with the lab results as soon as they are available. The fastest way to get your results is to activate your My Chart account. Instructions are located on the last page of this paperwork. If you have not heard from [place] regarding the results in 2 weeks, please contact this office.     Hypertension Hypertension, commonly called high blood pressure, is when the force of blood pumping through your arteries is too strong. Your arteries are the blood vessels that carry blood from your heart throughout your body. A blood pressure reading consists of a higher number over a lower number, such as 110/72. The higher number (systolic) is the pressure inside your arteries when your heart pumps. The lower number (diastolic) is the pressure inside your arteries when your heart relaxes. Ideally you want your blood pressure below 120/80. Hypertension forces your heart to work harder to pump blood. Your arteries may become narrow or stiff. Having untreated or uncontrolled hypertension can cause heart attack, stroke, kidney disease, and other problems. RISK FACTORS Some risk factors for high blood pressure are controllable. Others are not.  Risk factors you cannot control include:   Race. You may be at higher risk if you are African American.  Age. Risk increases with age.  Gender. Men are at higher risk than women before age 27 years. After age 17, women are at higher risk than men. Risk factors you can  control include:  Not getting enough exercise or physical activity.  Being overweight.  Getting too much fat, sugar, calories, or salt in your diet.  Drinking too much alcohol. SIGNS AND SYMPTOMS Hypertension does not usually cause signs or symptoms. Extremely high blood pressure (hypertensive crisis) may cause headache, anxiety, shortness of breath, and nosebleed. DIAGNOSIS To check if you have hypertension, your health care provider will measure your blood pressure while you are seated, with your arm held at the level of your heart. It should be measured at least twice using the same arm. Certain conditions can cause a difference in blood pressure between your right and left arms. A blood pressure reading that is higher than normal on one occasion does not mean that you need treatment. If it is not clear whether you have high blood pressure, you may be asked to return on a different day to have your blood pressure checked again. Or, you may be asked to monitor your blood pressure at home for 1 or more weeks. TREATMENT Treating high blood pressure includes making lifestyle changes and possibly taking medicine. Living a healthy lifestyle can help lower high blood pressure. You may need to change some of your habits. Lifestyle changes may include:  Following the DASH diet. This diet is high in fruits, vegetables, and whole grains. It is low in salt, red meat, and added sugars.  Keep your sodium intake below 2,300 mg per day.  Getting at least 30-45 minutes of aerobic exercise at least 4 times per week.  Losing weight if necessary.  Not smoking.  Limiting alcoholic beverages.  Learning ways to reduce stress. Your health care provider may prescribe medicine if lifestyle changes are not enough to get your blood pressure under control, and if one of the following is true:  You are 39-73 years of age and your systolic blood pressure is above 140.  You are 102 years of age or older, and  your systolic blood pressure is above 150.  Your diastolic blood pressure is above 90.  You have diabetes, and your systolic blood pressure is over [id number] or your diastolic blood pressure is over 90.  You have kidney disease and your blood pressure is above 140/90.  You have heart disease and your blood pressure is above 140/90. Your personal target blood pressure may vary depending on your medical conditions, your age, and other factors. HOME CARE INSTRUCTIONS  Have your blood pressure rechecked as directed by your health care provider.   Take medicines only as directed by your health care provider. Follow the directions carefully. Blood pressure medicines must be taken as prescribed. The medicine does not work as well when you skip doses. Skipping doses also puts you at risk for problems.  Do not smoke.   Monitor your blood pressure at home as directed by your health care provider. SEEK MEDICAL CARE IF:   You think you are having a reaction to medicines taken.  You have recurrent headaches or feel dizzy.  You have swelling in your ankles.  You have trouble with your vision. SEEK IMMEDIATE MEDICAL CARE IF:  You develop a severe headache or confusion.  You have unusual weakness, numbness, or feel faint.  You have severe chest or abdominal pain.  You vomit repeatedly.  You have trouble breathing. MAKE SURE YOU:   Understand these instructions.  Will watch your condition.  Will get help right away if you are not doing well or get worse.   This information is not intended to replace advice given to you by your health care provider. Make sure you discuss any questions you have with your health care provider.   Document Released: 05/01/2005 Document Revised: 09/15/2014 Document Reviewed: 02/21/2013 Elsevier Interactive Patient Education [employer].

## 2016-02-25 NOTE — Progress Notes (Signed)
Subjective:    Patient ID: Daniel [name], male    DOB: [birth year], 75 y.o.   MRN: [id number]  02/25/2016  Medication Refill (Amlodipine Besylate 10mg , Lisinopril-HCTZ 20-25mg , pt will check with [facility] regarding flu shot)   HPI This 75 y.o. male presents for one year follow-up for hypertension, glaucoma.  Patient reports good compliance with medication, good tolerance to medication, and good symptom control.  Home BP readings running 146-137/65.  Ran out of medication two weeks ago.    B finger numbness: will be driving and will get numbness.  Drives a lot to [place] to [place].     Review of Systems  Constitutional: Negative for activity change, appetite change, chills, diaphoresis, fatigue and fever.  Eyes: Negative for visual disturbance.  Respiratory: Negative for cough and shortness of breath.   Cardiovascular: Negative for chest pain, palpitations and leg swelling.  Endocrine: Negative for cold intolerance, heat intolerance, polydipsia, polyphagia and polyuria.  Neurological: Negative for dizziness, tremors, seizures, syncope, facial asymmetry, speech difficulty, weakness, light-headedness, numbness and headaches.    Past Medical History:  Diagnosis Date  . Glaucoma   . Hypertension    Past Surgical History:  Procedure Laterality Date  . EYE SURGERY     B cataract surgery. [name].   No Known Allergies Current Outpatient Prescriptions  Medication Sig Dispense Refill  . amLODipine (NORVASC) 10 MG tablet Take 1 tablet (10 mg total) by mouth daily. 90 tablet 1  . aspirin 81 MG tablet Take 81 mg by mouth daily.    Marland Kitchen lisinopril-hydrochlorothiazide (PRINZIDE,ZESTORETIC) 20-25 MG tablet Take 1 tablet by mouth daily. 90 tablet 1  . loratadine (CLARITIN) 10 MG tablet Take 1 tablet (10 mg total) by mouth daily. (Patient not taking: Reported on 02/25/2016) 90 tablet 3   No current facility-administered medications for this visit.    Social History   Social History  . Marital  status: Married    Spouse name: N/A  . Number of children: N/A  . Years of education: N/A   Occupational History  . Not on file.   Social History Main Topics  . Smoking status: Never Smoker  . Smokeless tobacco: Never Used  . Alcohol use Yes     Comment: ocas  . Drug use: No  . Sexual activity: Not on file   Other Topics Concern  . Not on file   Social History Narrative   Marital status: widowed since 2010; married x 37 years. Not dating in 2017.      Children: 3 daughters; no sons; 7 grandchildren; 1 gg.      Lives: alone in house in neighborhood.     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       Tobacco:  Quit smoking 30 years ago.      Alcohol: weekends. Scotch.      Exercise: sporadic; cuts grass and trims hedges. Stays busy in yard.        Seatbelt:  100%      Guns: none      ADLs: drives; independent ADLs; buys groceries; pays bills.  Cut grass.  NO cane or assistant devices.      Advanced Directives: yes; No CPR; DNI/DNR.              Family History  Problem Relation Age of Onset  . Diabetes Mother        Objective:    BP 134/76   Pulse 88   Temp 98.6 F (  37 C) (Oral)   Resp 16   Ht 5\' 9"  (1.753 m)   Wt 238 lb 12.8 oz (108.3 kg)   SpO2 94%   BMI 35.26 kg/m  Physical Exam  Constitutional: He is oriented to person, place, and time. He appears well-developed and well-nourished. No distress.  HENT:  Head: Normocephalic and atraumatic.  Right Ear: External ear normal.  Left Ear: External ear normal.  Nose: Nose normal.  Mouth/Throat: Oropharynx is clear and moist.  Eyes: Conjunctivae and EOM are normal. Pupils are equal, round, and reactive to light.  Neck: Normal range of motion. Neck supple. Carotid bruit is not present. No thyromegaly present.  Cardiovascular: Normal rate, regular rhythm, normal heart sounds and intact distal pulses.  Exam reveals no gallop and no friction rub.   No murmur heard. Pulmonary/Chest: Effort  normal and breath sounds normal. He has no wheezes. He has no rales.  Abdominal: Soft. Bowel sounds are normal. He exhibits no distension and no mass. There is no tenderness. There is no rebound and no guarding.  Lymphadenopathy:    He has no cervical adenopathy.  Neurological: He is alert and oriented to person, place, and time. No cranial nerve deficit.  Skin: Skin is warm and dry. No rash noted. He is not diaphoretic.  Psychiatric: He has a normal mood and affect. His behavior is normal.  Nursing note and vitals reviewed.  Results for orders placed or performed in visit on 02/25/16  POCT urinalysis dipstick  Result Value Ref Range   Color, UA yellow yellow   Clarity, UA clear clear   Glucose, UA negative negative   Bilirubin, UA negative negative   Ketones, POC UA negative negative   Spec Grav, UA 1.020    Blood, UA negative negative   pH, UA 7.0    Protein Ur, POC =30 (A) negative   Urobilinogen, UA 1.0    Nitrite, UA Negative Negative   Leukocytes, UA Negative Negative   EKG: NSR; T wave inversion I, AVL, V4-V6. Unchanged from 2015.    Assessment & Plan:   1. Essential hypertension   2. Renal insufficiency   3. LVH (left ventricular hypertrophy)   4. Generalized edema   5. Nocturia    -controlled; refills provided. Obtain labs. -nocturia increased; recommend stopping fluid intake at 8:00pm. Normal urinary stream.   Orders Placed This Encounter  Procedures  . CBC with Differential/Platelet  . Comprehensive metabolic panel  . POCT urinalysis dipstick  . EKG 12-Lead   Meds ordered this encounter  Medications  . lisinopril-hydrochlorothiazide (PRINZIDE,ZESTORETIC) 20-25 MG tablet    Sig: Take 1 tablet by mouth daily.    Dispense:  90 tablet    Refill:  1  . amLODipine (NORVASC) 10 MG tablet    Sig: Take 1 tablet (10 mg total) by mouth daily.    Dispense:  90 tablet    Refill:  1    Return in about 6 months (around 08/25/2016) for complete physical  examiniation.   Misti Towle Elayne Guerin, M.D. [facility] [address] [place], [place]  [address] [phone] phone [phone] fax

## 2016-03-29 ENCOUNTER — Encounter: Payer: Medicare Other | Admitting: Family Medicine

## 2016-06-02 ENCOUNTER — Encounter: Payer: Self-pay | Admitting: Family Medicine

## 2016-06-28 ENCOUNTER — Ambulatory Visit (INDEPENDENT_AMBULATORY_CARE_PROVIDER_SITE_OTHER): Payer: Medicare Other | Admitting: Family Medicine

## 2016-06-28 ENCOUNTER — Ambulatory Visit (INDEPENDENT_AMBULATORY_CARE_PROVIDER_SITE_OTHER): Payer: Medicare Other

## 2016-06-28 ENCOUNTER — Encounter: Payer: Self-pay | Admitting: Family Medicine

## 2016-06-28 VITALS — BP 114/64 | HR 68 | Temp 98.7°F | Resp 18 | Ht 69.0 in | Wt 231.0 lb

## 2016-06-28 DIAGNOSIS — M25561 Pain in right knee: Secondary | ICD-10-CM

## 2016-06-28 DIAGNOSIS — M7989 Other specified soft tissue disorders: Secondary | ICD-10-CM | POA: Diagnosis not present

## 2016-06-28 DIAGNOSIS — M25461 Effusion, right knee: Secondary | ICD-10-CM

## 2016-06-28 DIAGNOSIS — N289 Disorder of kidney and ureter, unspecified: Secondary | ICD-10-CM | POA: Diagnosis not present

## 2016-06-28 DIAGNOSIS — Z23 Encounter for immunization: Secondary | ICD-10-CM

## 2016-06-28 DIAGNOSIS — I10 Essential (primary) hypertension: Secondary | ICD-10-CM

## 2016-06-28 IMAGING — DX DG KNEE COMPLETE 4+V*R*
4 series · 4 of 4 positions shown · non-contrast
Comparison: No recent prior.

CLINICAL DATA: Pain and swelling.

EXAM:
RIGHT KNEE - COMPLETE 4+ VIEW

[knee ap]
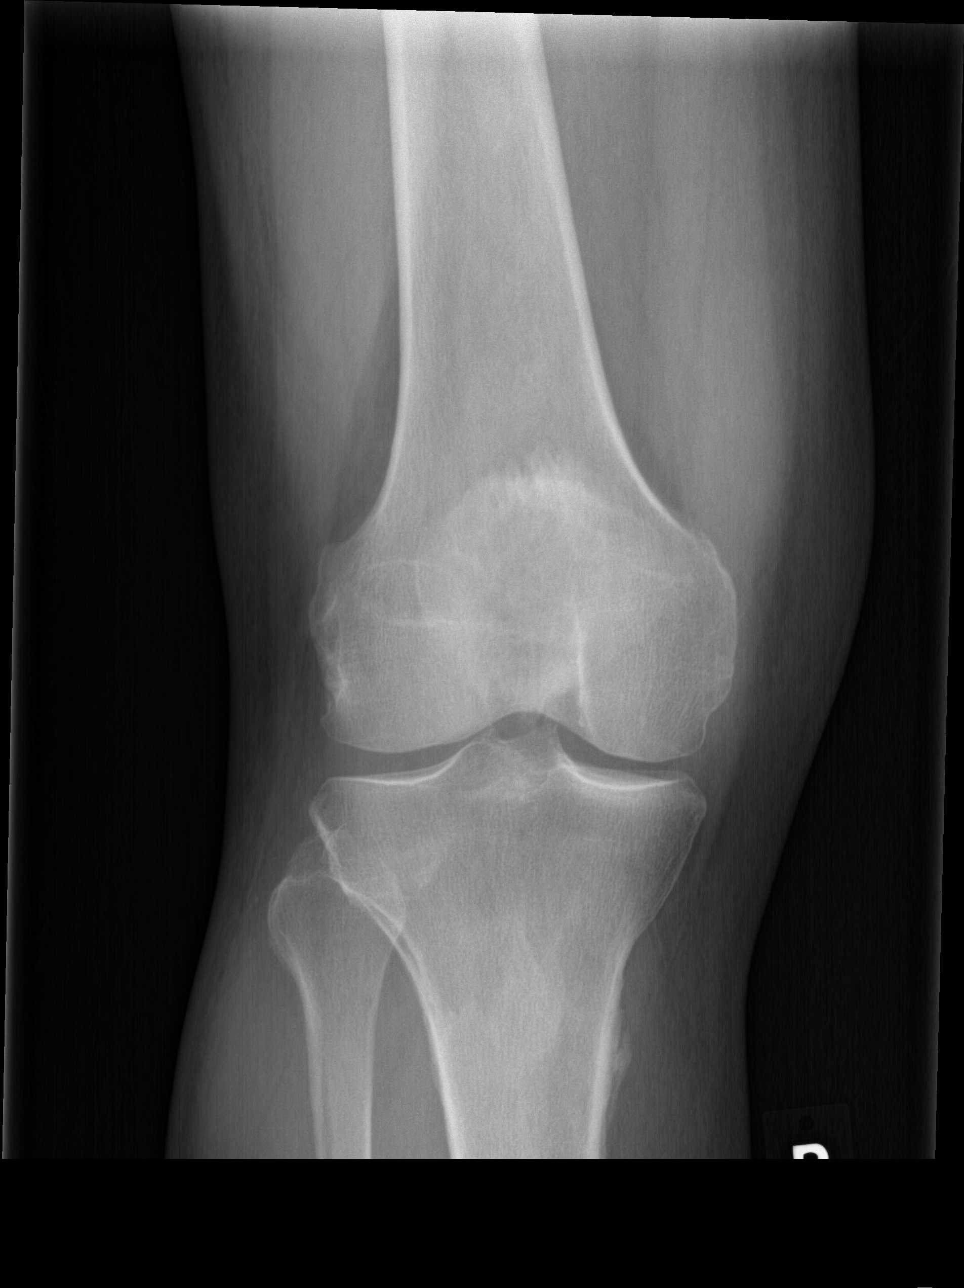

[knee obl (1 of 2)]
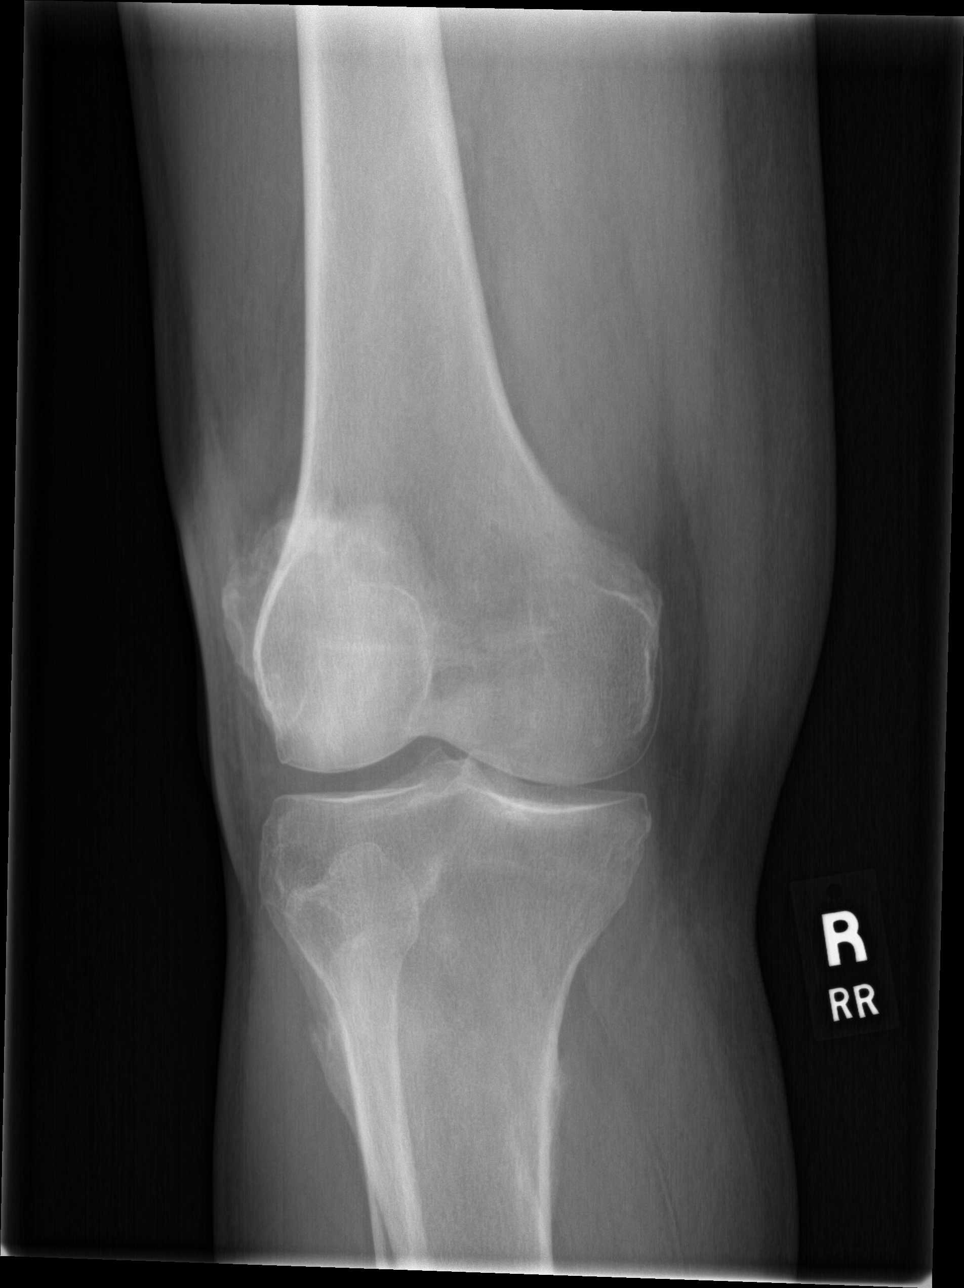

[knee obl (2 of 2)]
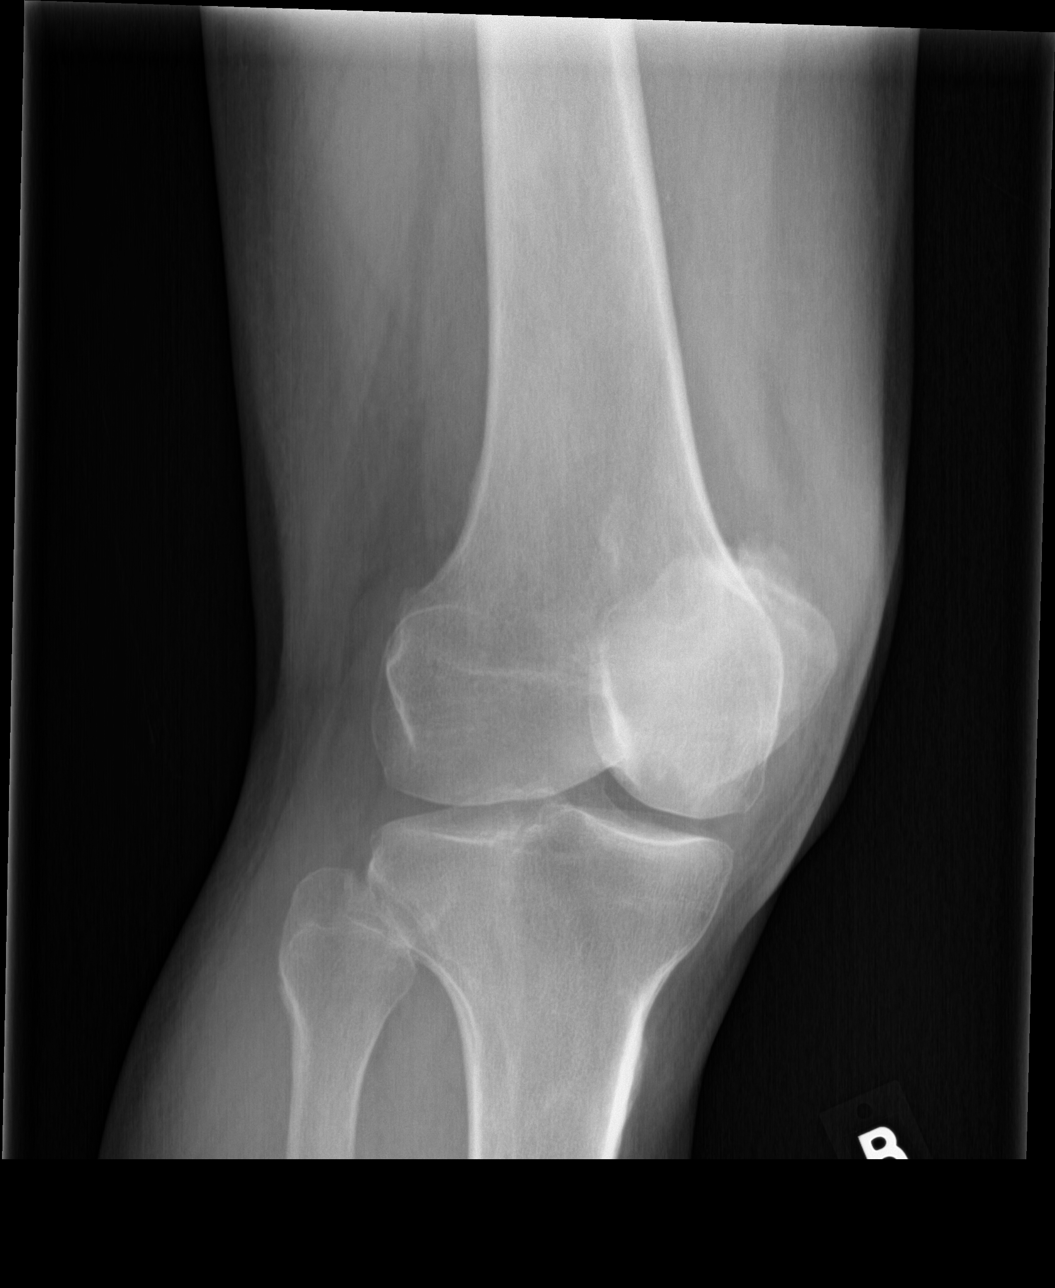

[knee lat]
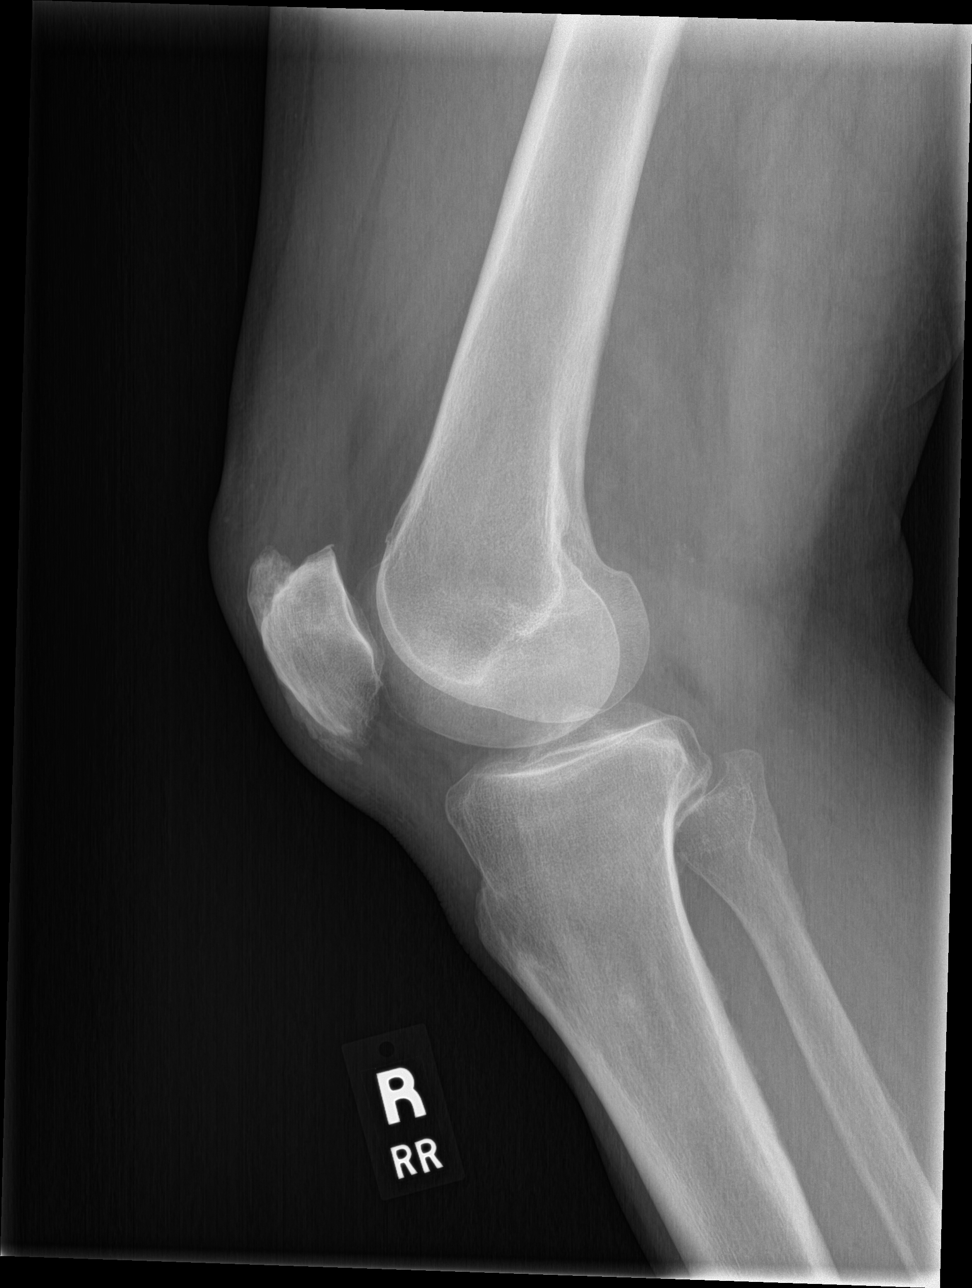

[4 of 4 positions shown; findings below may reference images not displayed]

FINDINGS: Tricompartment degenerative change. Degenerative changes most
prominent about patellofemoral joint space. No acute bony or joint
abnormality identified. Prominent suprapatellar and infrapatellar
soft tissue swelling is noted. Small knee joint effusion cannot be
completely excluded.
IMPRESSION: 1. Prominent suprapatellar and infrapatellar soft tissue swelling.
MRI can be obtained to exclude quadriceps or patellar tendon injury
. Small knee joint effusion cannot be excluded. No acute bony
abnormality identified.

2. Tricompartment degenerative change, particularly prominent about
the patellofemoral space.

## 2016-06-28 MED ORDER — DICLOFENAC SODIUM 1 % TD GEL: 4.0000 g | Freq: Four times a day (QID) | TRANSDERMAL | 1 refills | Status: DC

## 2016-06-28 MED ORDER — KETOCONAZOLE 2 % EX CREA: 1.0000 "application " | TOPICAL_CREAM | Freq: Two times a day (BID) | CUTANEOUS | 0 refills | Status: DC

## 2016-06-28 NOTE — Progress Notes (Signed)
Subjective:    Patient ID: Daniel [name], male    DOB: 1941/04/11, 76 y.o.   MRN: [id number]  06/28/2016  Joint Swelling (RIGHT KNEE)   HPI This 76 y.o. male presents for evaluation of R KNEE SWELLING.  Woke up with pain two weeks ago; then knee really swelled last week. Swelling has improved.  No fall or injury. Initially unable to walk; now able to walk with cane. Had gotten sore on top of knee lateral.  Then started swelling.  No redness to knee.  Swelling down leg.  Ankle also swelling.  No intervention.  Has taken ASA and applied icy hot.  Applied heat.  Taking ASA daily once.  No history of knee issues in the past.  No history of gout.  No giving out; no popping; severity 8/10.  At rest, pain free.  With certain movements, pain 8/10.  No nighttime awakening. Elevating.   Immunization History  Administered Date(s) Administered  . Influenza,inj,Quad PF,36+ Mos 01/28/2014, 06/28/2016  . Pneumococcal Conjugate-13 01/28/2014  . Pneumococcal Polysaccharide-23 12/04/2014   BP Readings from Last 3 Encounters:  06/28/16 114/64  02/25/16 134/76  12/04/14 128/74   Wt Readings from Last 3 Encounters:  06/28/16 231 lb (104.8 kg)  02/25/16 238 lb 12.8 oz (108.3 kg)  12/04/14 226 lb 12.8 oz (102.9 kg)     Review of Systems  Constitutional: Negative for activity change, appetite change, chills, diaphoresis, fatigue and fever.  Eyes: Negative for visual disturbance.  Respiratory: Negative for cough and shortness of breath.   Cardiovascular: Negative for chest pain, palpitations and leg swelling.  Endocrine: Negative for cold intolerance, heat intolerance, polydipsia, polyphagia and polyuria.  Musculoskeletal: Positive for arthralgias, gait problem and joint swelling.  Neurological: Negative for dizziness, tremors, seizures, syncope, facial asymmetry, speech difficulty, weakness, light-headedness, numbness and headaches.    Past Medical History:  Diagnosis Date  . Glaucoma   .  Hypertension    Past Surgical History:  Procedure Laterality Date  . EYE SURGERY     B cataract surgery. [name].   No Known Allergies  Social History   Social History  . Marital status: Married    Spouse name: N/A  . Number of children: N/A  . Years of education: N/A   Occupational History  . Not on file.   Social History Main Topics  . Smoking status: Never Smoker  . Smokeless tobacco: Never Used  . Alcohol use Yes     Comment: ocas  . Drug use: No  . Sexual activity: Not on file   Other Topics Concern  . Not on file   Social History Narrative   Marital status: widowed since 2010; married x 37 years. Not dating in 2017.      Children: 3 daughters; no sons; 7 grandchildren; 1 gg.      Lives: alone in house in neighborhood.     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       Tobacco:  Quit smoking 30 years ago.      Alcohol: weekends. Scotch.      Exercise: sporadic; cuts grass and trims hedges. Stays busy in yard.        Seatbelt:  100%      Guns: none      ADLs: drives; independent ADLs; buys groceries; pays bills.  Cut grass.  NO cane or assistant devices.      Advanced Directives: yes; No CPR; DNI/DNR.  Family History  Problem Relation Age of Onset  . Diabetes Mother        Objective:    BP 114/64 (BP Location: Right Arm, Patient Position: Sitting, Cuff Size: Large)   Pulse 68   Temp 98.7 F (37.1 C) (Oral)   Resp 18   Ht 5\' 9"  (1.753 m)   Wt 231 lb (104.8 kg)   SpO2 96%   BMI 34.11 kg/m  Physical Exam  Constitutional: He is oriented to person, place, and time. He appears well-developed and well-nourished. No distress.  HENT:  Head: Normocephalic and atraumatic.  Right Ear: External ear normal.  Left Ear: External ear normal.  Nose: Nose normal.  Mouth/Throat: Oropharynx is clear and moist.  Eyes: Conjunctivae and EOM are normal. Pupils are equal, round, and reactive to light.  Neck: Normal range of  motion. Neck supple. Carotid bruit is not present.  Cardiovascular: Normal rate, regular rhythm, normal heart sounds and intact distal pulses.  Exam reveals no gallop and no friction rub.   No murmur heard. Pulmonary/Chest: Effort normal and breath sounds normal. He has no wheezes. He has no rales.  Abdominal: Soft. Bowel sounds are normal.  Musculoskeletal:       Right knee: He exhibits decreased range of motion, swelling and effusion. He exhibits no laceration, no erythema, normal alignment and no bony tenderness. No tenderness found. No medial joint line and no lateral joint line tenderness noted.  R knee: mild effusion present; painful extension; V/V strain intact; McMurray's negative.  Lachman's negative.   Neurological: He is alert and oriented to person, place, and time. No cranial nerve deficit.  Skin: Skin is warm and dry. No rash noted. He is not diaphoretic.  Psychiatric: He has a normal mood and affect. His behavior is normal.  Nursing note and vitals reviewed.       Assessment & Plan:   1. Pain and swelling of right knee   2. Essential hypertension   3. Renal insufficiency   4. Need for prophylactic vaccination and inoculation against influenza    -New onset R knee pain without trauma. -obtain R knee films; most consistent with OA. -rx for Voltaren gel due to renal insufficiency. -recommend resting, icing, elevation. -call office in 1-2 weeks if no improvement; will refer to ortho or SM. -s/p flu vaccine   Orders Placed This Encounter  Procedures  . DG Knee Complete 4 Views Right    Standing Status:   Future    Standing Expiration Date:   06/28/2017    Order Specific Question:   Reason for Exam (SYMPTOM  OR DIAGNOSIS REQUIRED)    Answer:   R knee pain and swelling; lateral proximal knee pain    Order Specific Question:   Preferred imaging location?    Answer:   External  . Flu Vaccine QUAD 36+ mos IM   Meds ordered this encounter  Medications  . diclofenac  sodium (VOLTAREN) 1 % GEL    Sig: Apply 4 g topically 4 (four) times daily.    Dispense:  100 g    Refill:  1    No Follow-up on file.   Hamdan Toscano Elayne Guerin, M.D. Primary Care at [facility] [address] [place], [place]  [address] [phone] phone [phone] fax

## 2016-06-28 NOTE — Patient Instructions (Addendum)
1.  Take Tylenol 2 tablets four times daily for knee pain. 2.  Ice knee twice daily for 20 minutes.     IF you received an x-ray today, you will receive an invoice from [facility]. Please contact [facility] at [phone] with questions or concerns regarding your invoice.   IF you received labwork today, you will receive an invoice from [facility]. Please contact [facility] at [phone] with questions or concerns regarding your invoice.   Our billing staff will not be able to assist you with questions regarding bills from these companies.  You will be contacted with the lab results as soon as they are available. The fastest way to get your results is to activate your My Chart account. Instructions are located on the last page of this paperwork. If you have not heard from [place] regarding the results in 2 weeks, please contact this office.

## 2016-09-07 ENCOUNTER — Emergency Department (HOSPITAL_COMMUNITY)
Admission: EM | Admit: 2016-09-07 | Discharge: 2016-09-07 | Disposition: A | Discharge status: Home / Self Care | Payer: Medicare Other | Attending: Emergency Medicine | Admitting: Emergency Medicine

## 2016-09-07 ENCOUNTER — Encounter (HOSPITAL_COMMUNITY): Payer: Self-pay

## 2016-09-07 ENCOUNTER — Emergency Department (HOSPITAL_COMMUNITY): Payer: Medicare Other

## 2016-09-07 DIAGNOSIS — M79671 Pain in right foot: Secondary | ICD-10-CM | POA: Diagnosis not present

## 2016-09-07 DIAGNOSIS — I10 Essential (primary) hypertension: Secondary | ICD-10-CM | POA: Insufficient documentation

## 2016-09-07 DIAGNOSIS — Z79899 Other long term (current) drug therapy: Secondary | ICD-10-CM | POA: Diagnosis not present

## 2016-09-07 DIAGNOSIS — Z7982 Long term (current) use of aspirin: Secondary | ICD-10-CM | POA: Insufficient documentation

## 2016-09-07 DIAGNOSIS — M25571 Pain in right ankle and joints of right foot: Secondary | ICD-10-CM | POA: Diagnosis not present

## 2016-09-07 DIAGNOSIS — M109 Gout, unspecified: Secondary | ICD-10-CM | POA: Diagnosis not present

## 2016-09-07 DIAGNOSIS — M7989 Other specified soft tissue disorders: Secondary | ICD-10-CM | POA: Diagnosis not present

## 2016-09-07 DIAGNOSIS — S8991XA Unspecified injury of right lower leg, initial encounter: Secondary | ICD-10-CM | POA: Diagnosis not present

## 2016-09-07 LAB — I-STAT CHEM 8, ED
BUN: 21 mg/dL — ABNORMAL HIGH (ref 6–20)
CALCIUM ION: 0.98 mmol/L — AB (ref 1.15–1.40)
CHLORIDE: 102 mmol/L (ref 101–111)
Creatinine, Ser: 1.2 mg/dL (ref 0.61–1.24)
Glucose, Bld: 99 mg/dL (ref 65–99)
HEMATOCRIT: 42 % (ref 39.0–52.0)
Hemoglobin: 14.3 g/dL (ref 13.0–17.0)
Potassium: 3.7 mmol/L (ref 3.5–5.1)
SODIUM: 140 mmol/L (ref 135–145)
TCO2: 24 mmol/L (ref 0–100)

## 2016-09-07 IMAGING — DX DG ANKLE COMPLETE 3+V*R*
3 series · 3 of 3 positions shown · non-contrast
Comparison: None.

CLINICAL DATA: Ankle pain and swelling, no known injury, initial
encounter

EXAM:
RIGHT ANKLE - COMPLETE 3+ VIEW

[x ankle lat right]
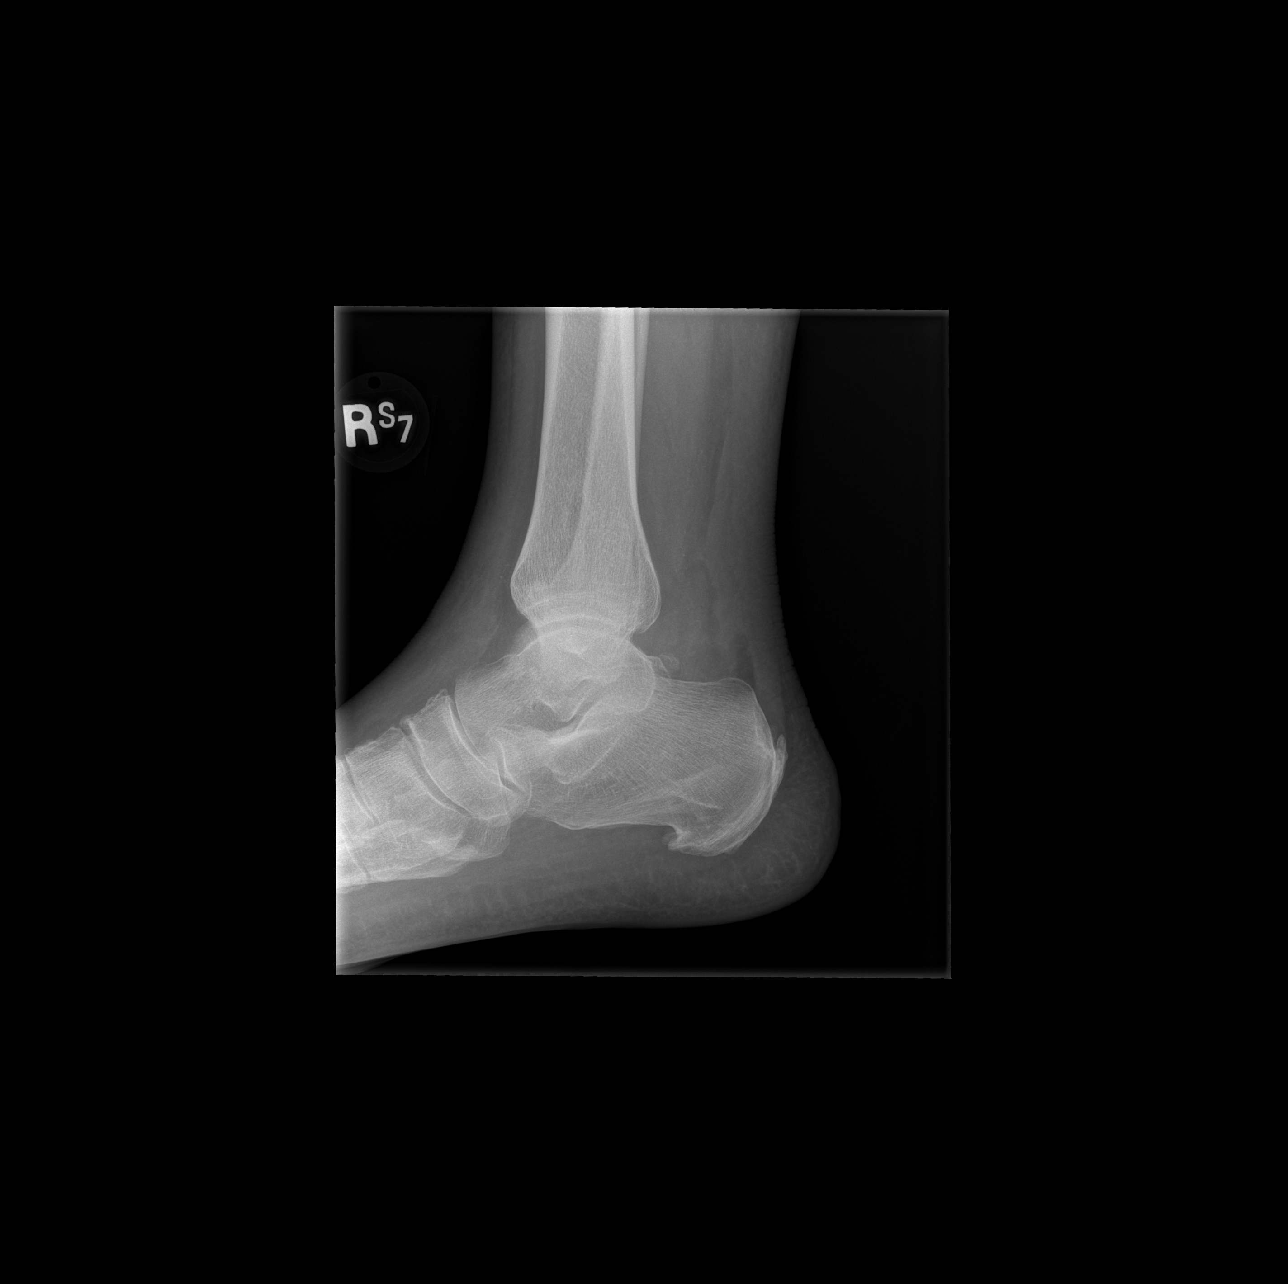

[x ankle ap right]
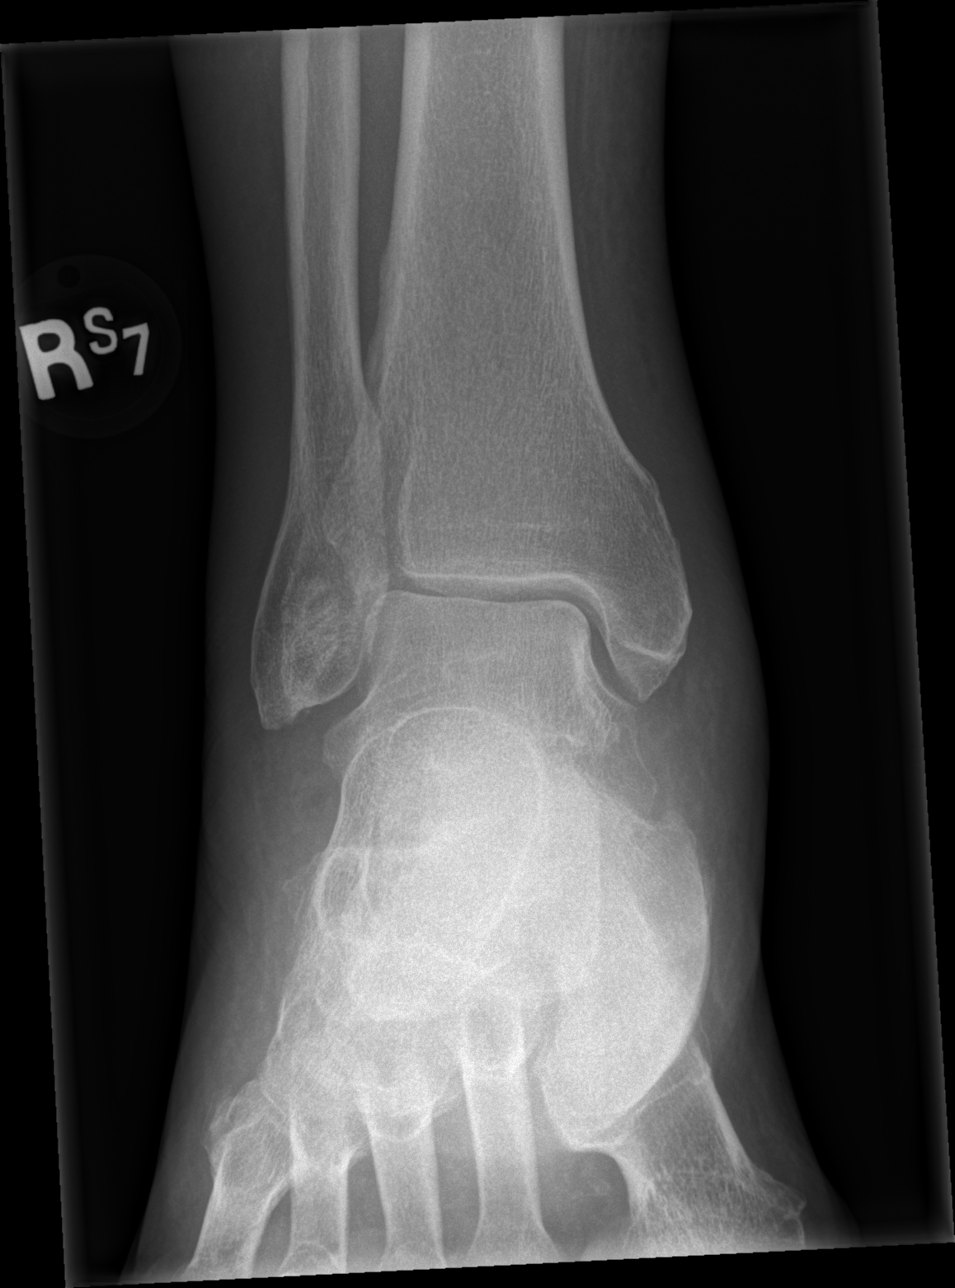

[x ankle obl right]
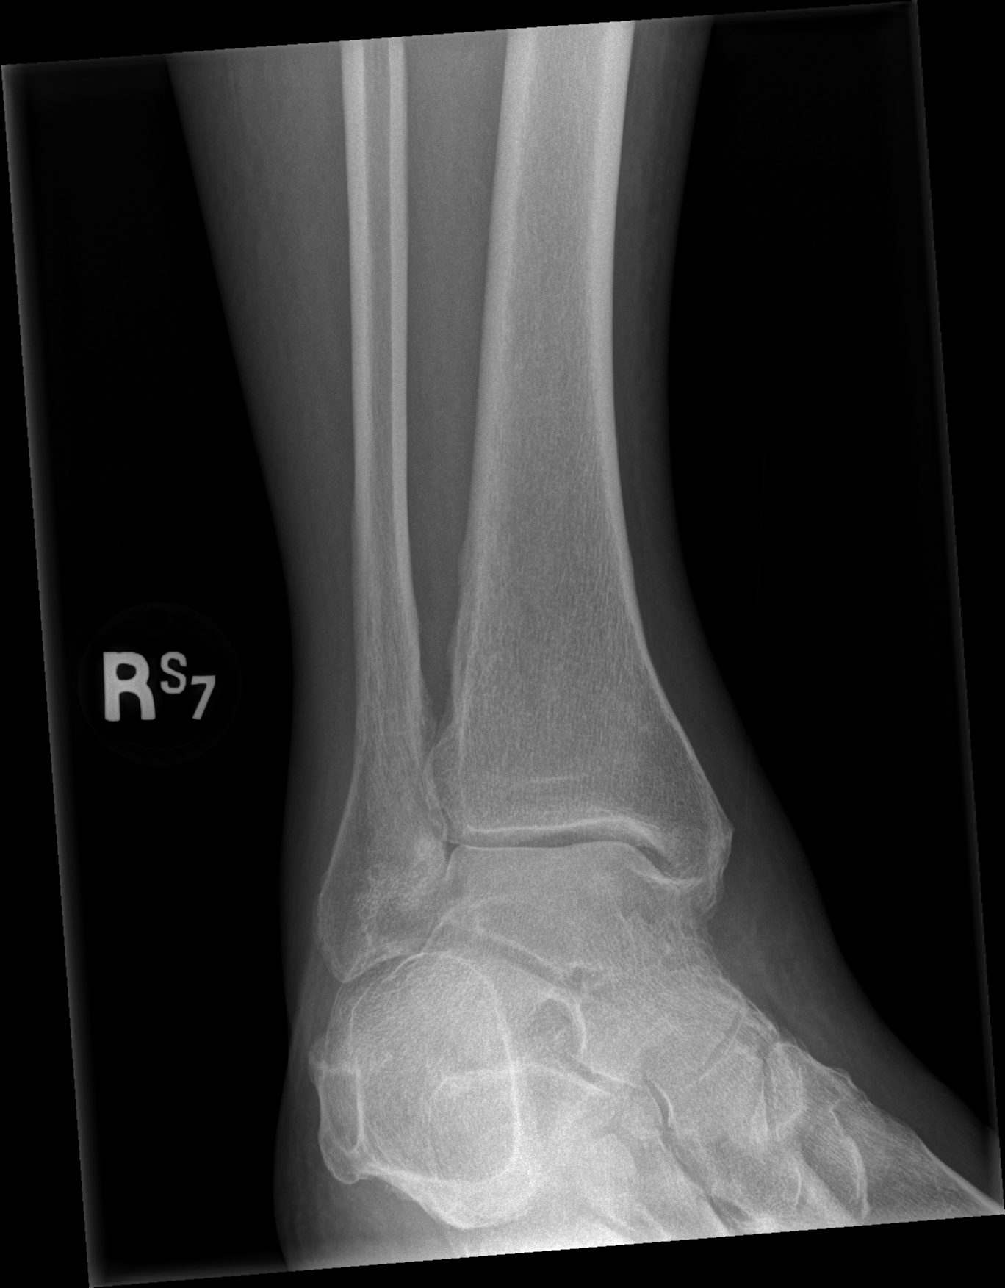

[3 of 3 positions shown; findings below may reference images not displayed]

FINDINGS: No acute fracture or dislocation is noted. Soft tissue changes are
noted medially without acute abnormality. Calcaneal spurring is
noted.
IMPRESSION: Soft tissue swelling medially without acute bony abnormality.

## 2016-09-07 IMAGING — DX DG KNEE COMPLETE 4+V*R*
4 series · 4 of 4 positions shown · non-contrast
Comparison: [DATE]

CLINICAL DATA: Right knee swelling for 6 months.  Recent fall.

EXAM:
RIGHT KNEE - COMPLETE 4+ VIEW

[t knee ap right]
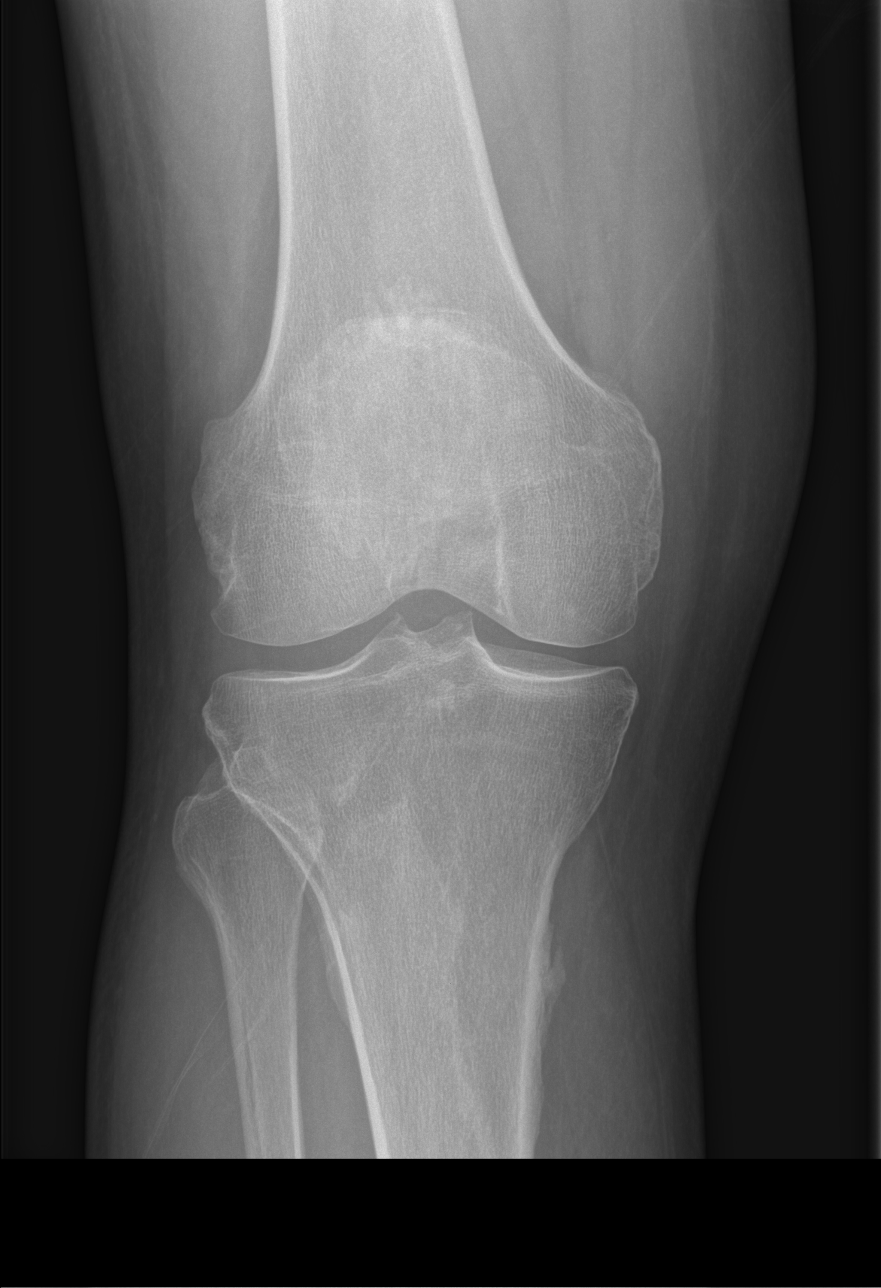

[t knee obl right (1 of 2)]
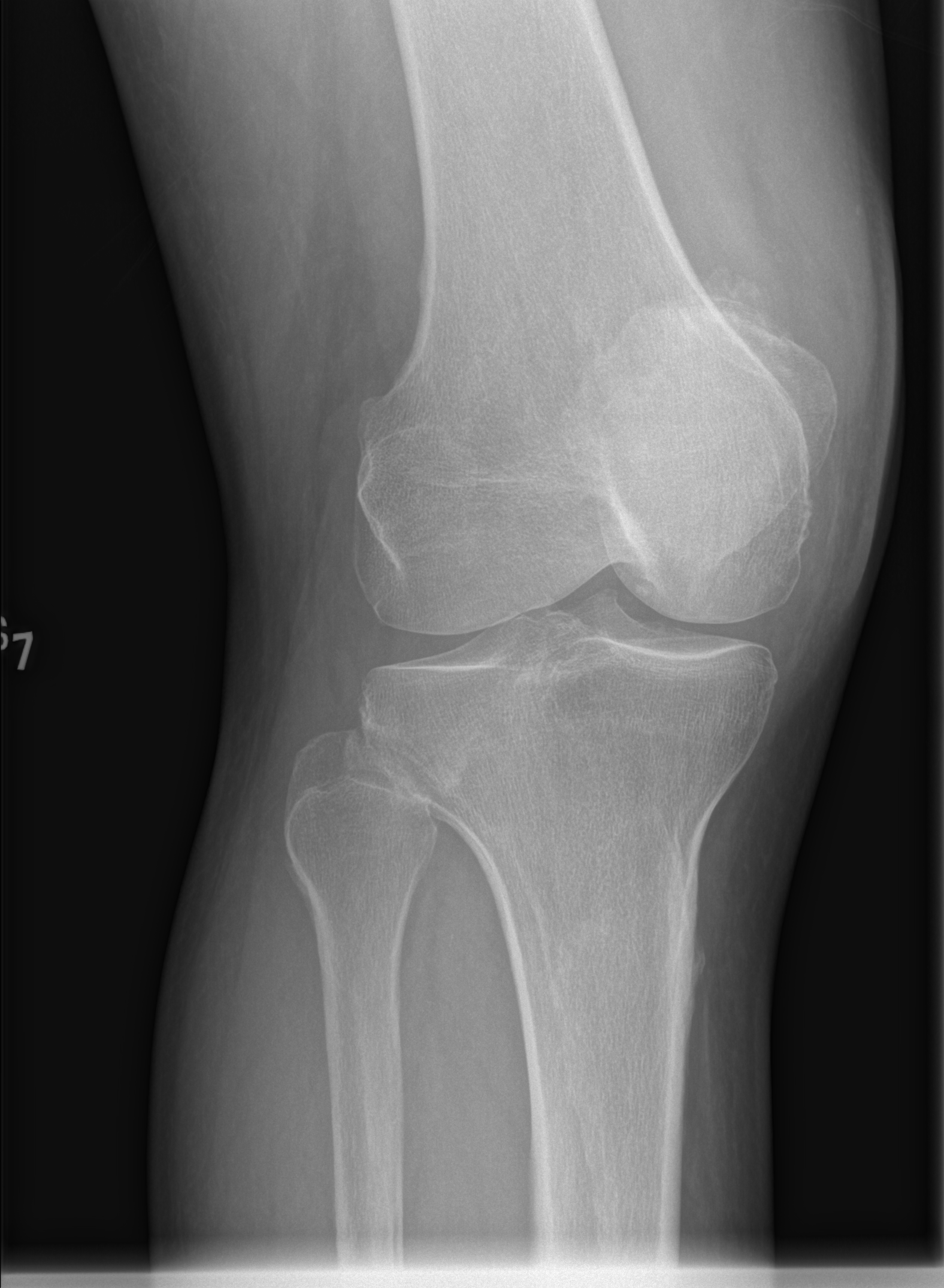

[t knee obl right (2 of 2)]
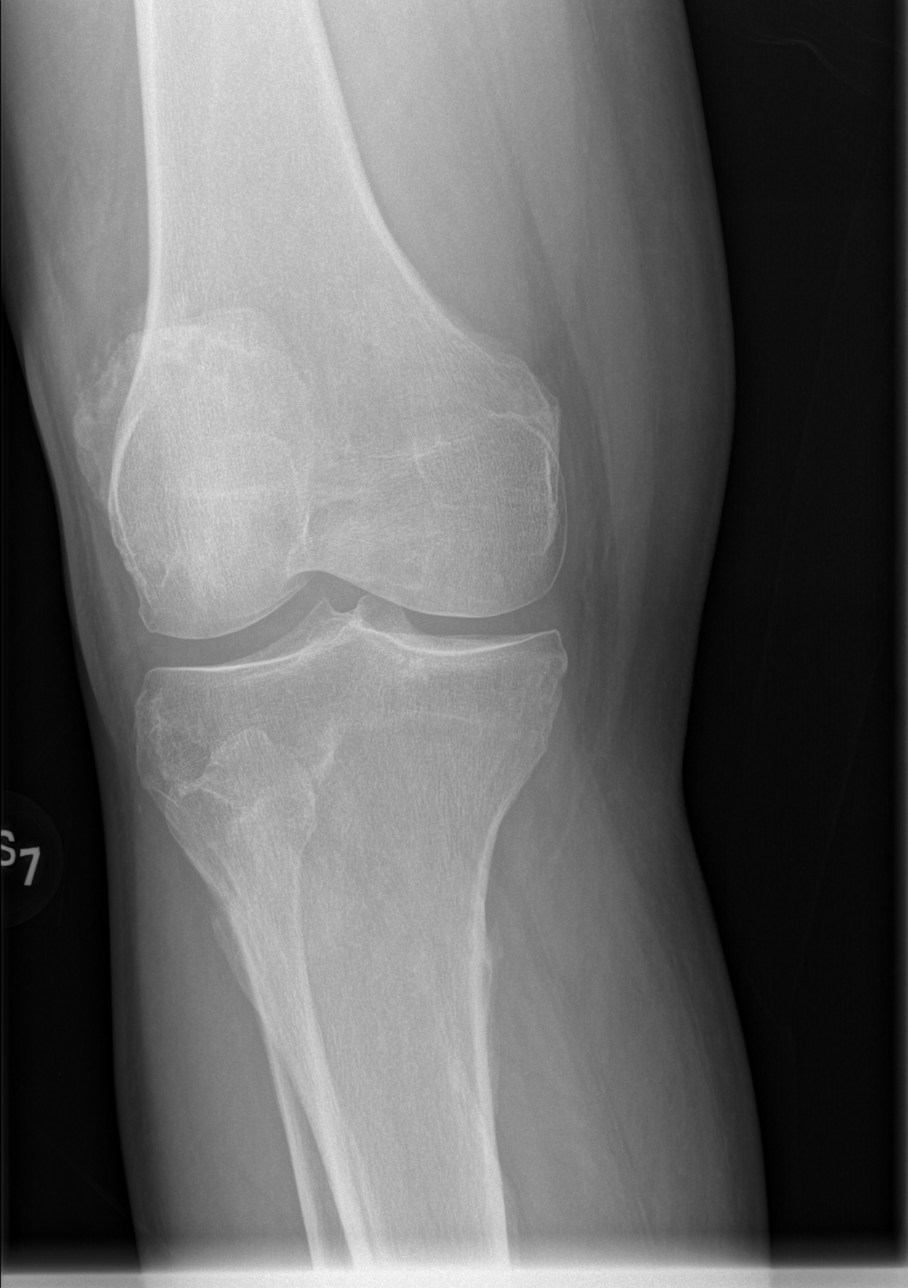

[t knee lat right]
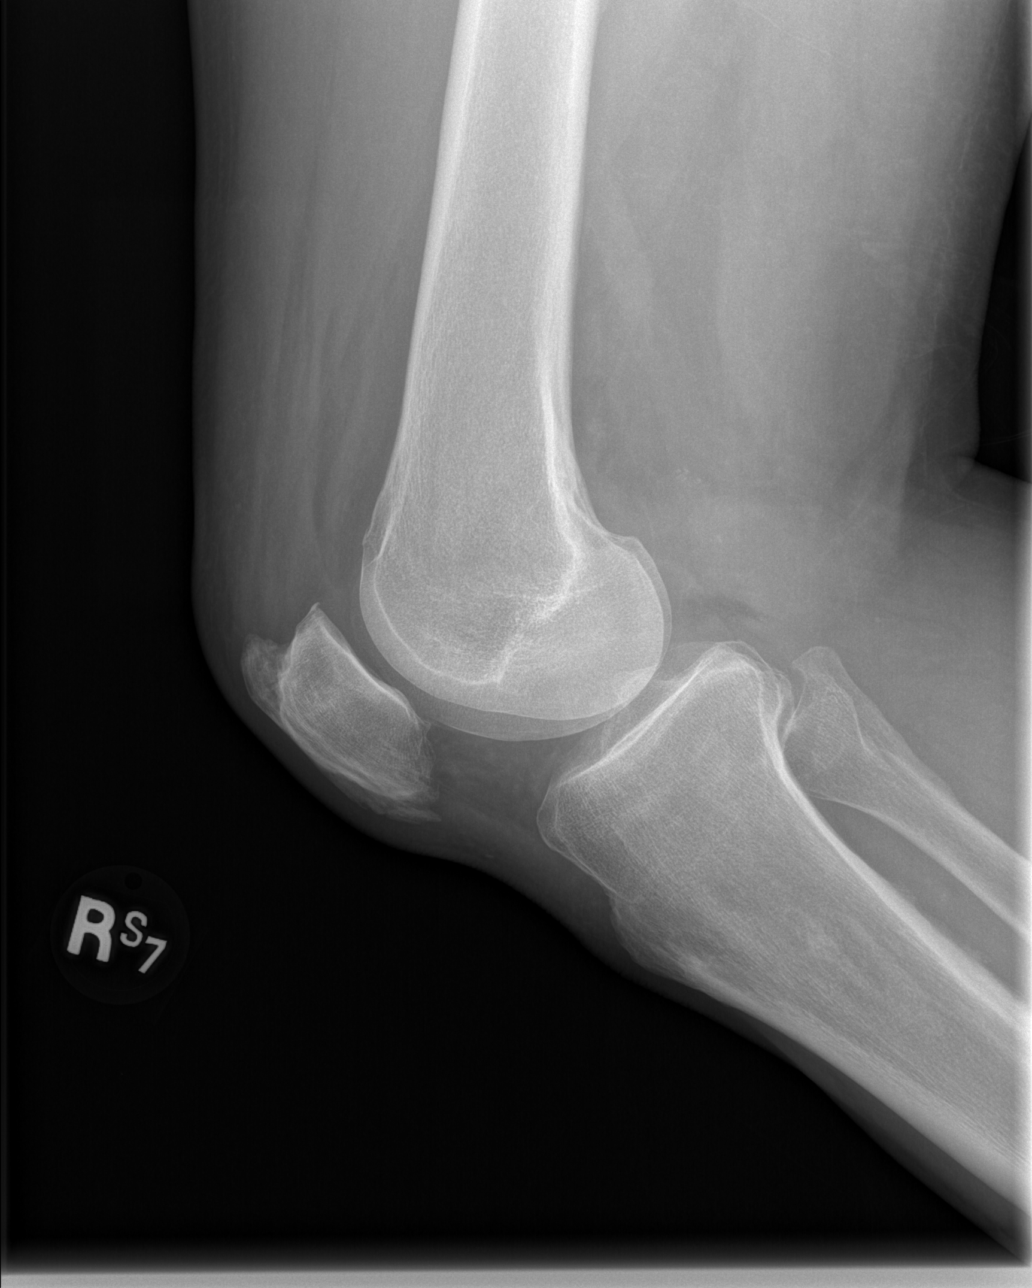

[4 of 4 positions shown; findings below may reference images not displayed]

FINDINGS: No acute fracture. No dislocation. Spurring at the superior and
inferior patella. Mild tricompartment osteoarthritic change. Stable
soft tissues.
IMPRESSION: No acute bony pathology.

## 2016-09-07 IMAGING — DX DG FOOT COMPLETE 3+V*R*
3 series · 3 of 3 positions shown · non-contrast
Comparison: None.

CLINICAL DATA: Right foot pain and swelling for several months, no
known injury, initial encounter

EXAM:
RIGHT FOOT COMPLETE - 3+ VIEW

[x foot ap right]
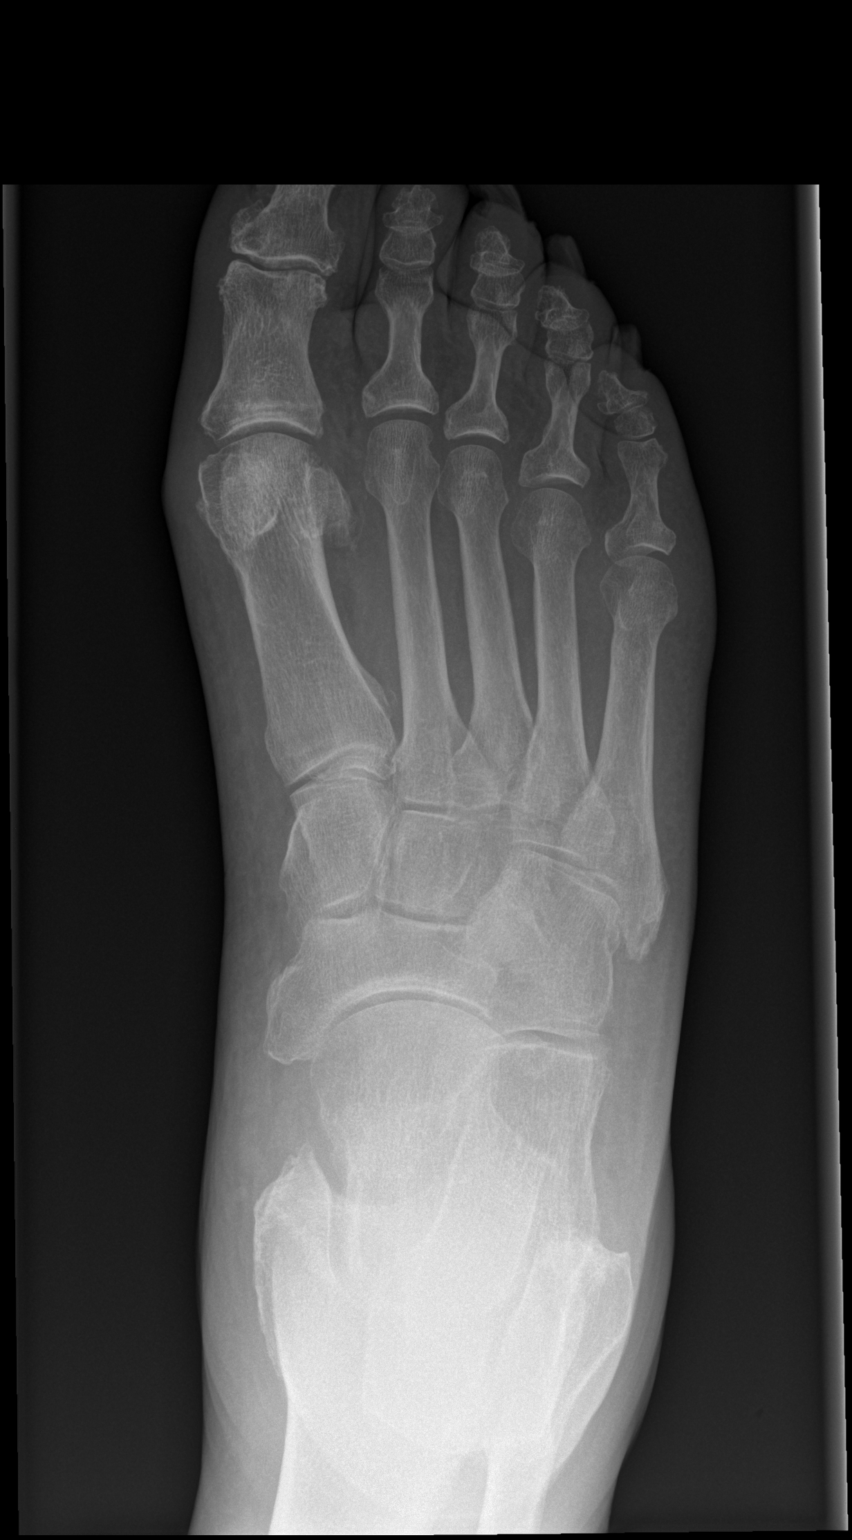

[x foot obl right]
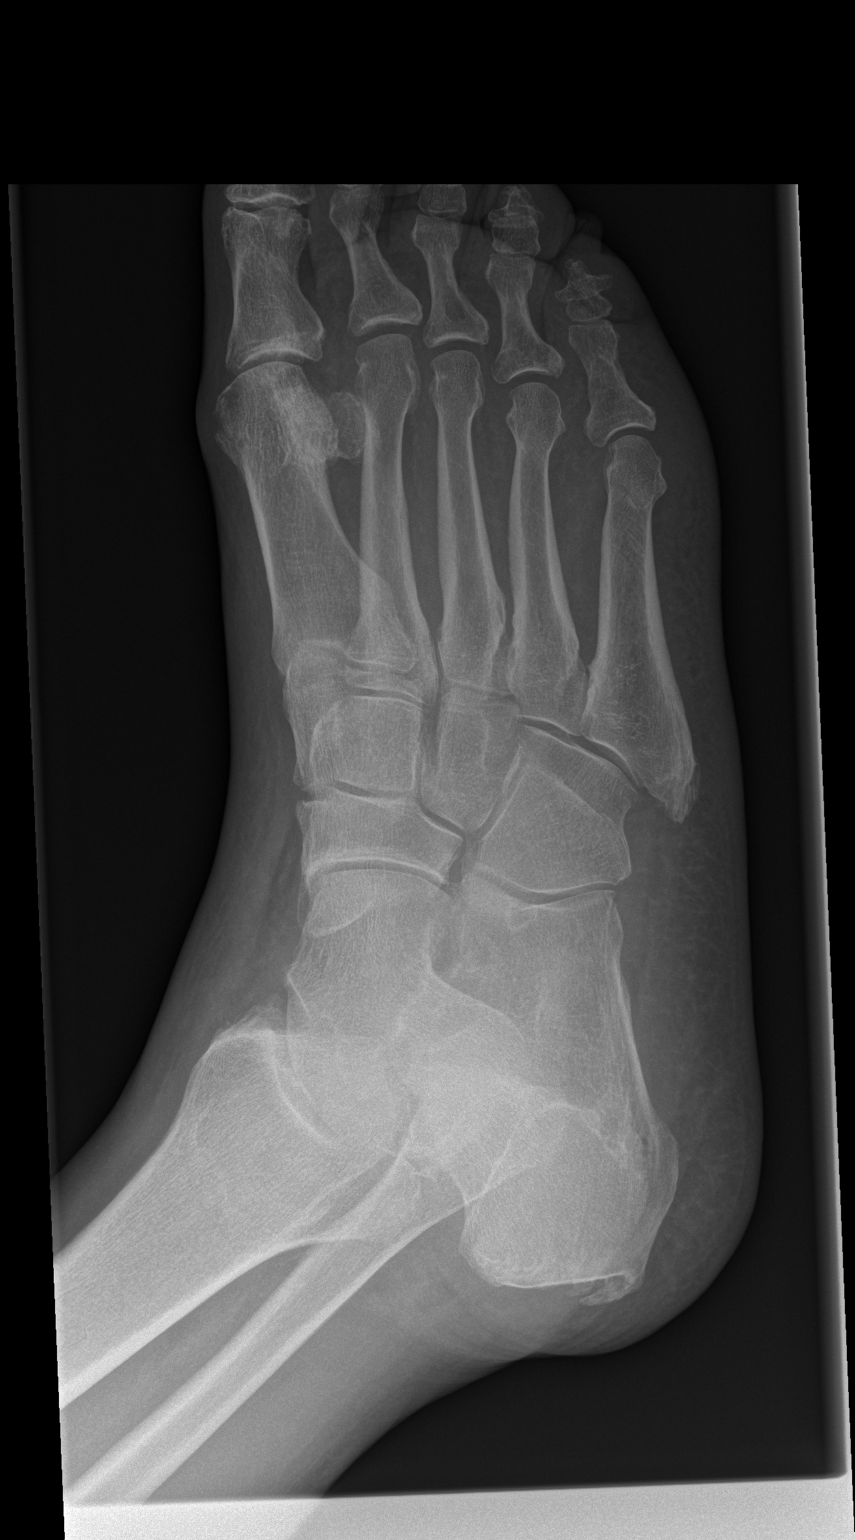

[x foot lat right]
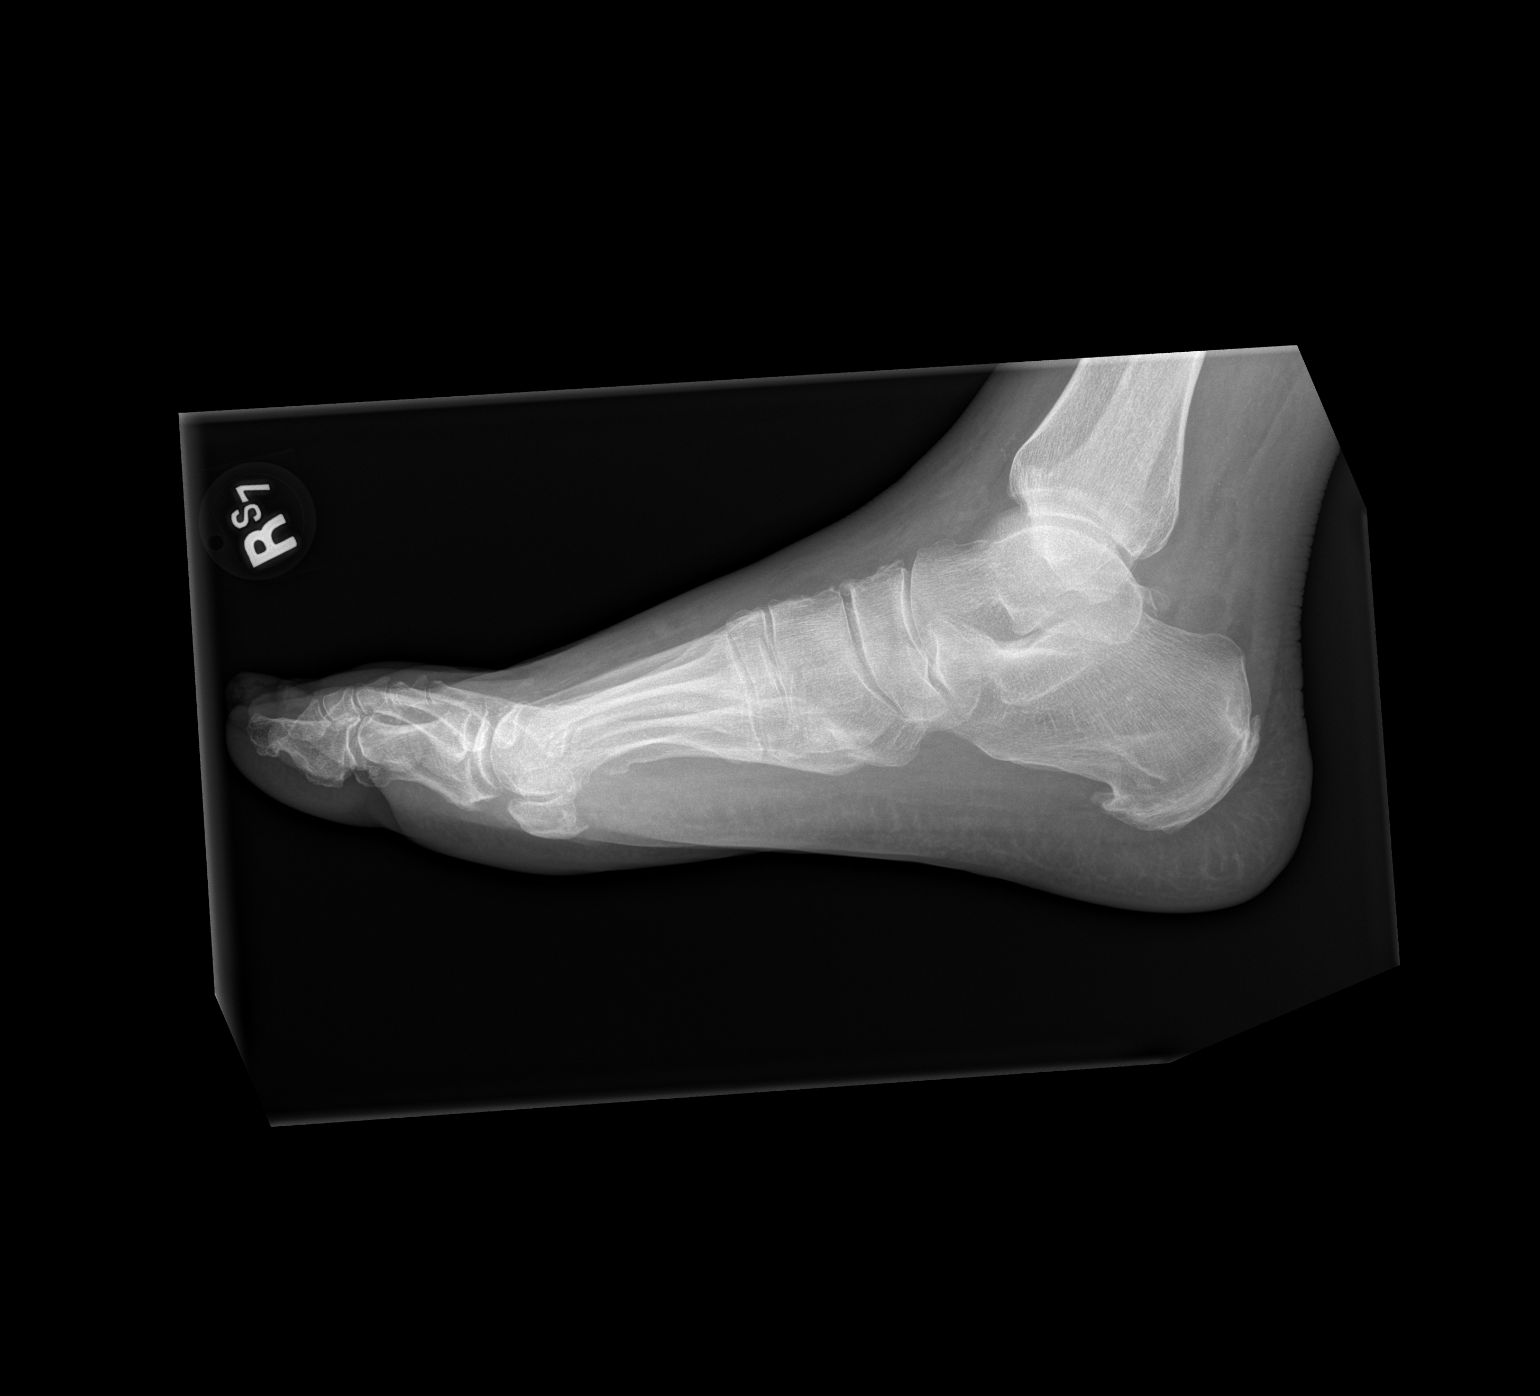

[3 of 3 positions shown; findings below may reference images not displayed]

FINDINGS: No acute fracture or dislocation is noted. Calcaneal spurring is
seen. No soft tissue abnormality is noted.
IMPRESSION: Chronic changes without acute abnormality.

## 2016-09-07 MED ORDER — COLCHICINE 0.6 MG PO TABS: ORAL_TABLET | ORAL | 0 refills | Status: DC

## 2016-09-07 MED ORDER — HYDROCODONE-ACETAMINOPHEN 5-325 MG PO TABS: 1.0000 | ORAL_TABLET | ORAL | 0 refills | Status: DC | PRN

## 2016-09-07 NOTE — ED Triage Notes (Signed)
Patient complains of right knee and lower lerg swelling intermittently for months. States that his primary MD gave him cream for same that is not working. Pain now increased and using cane to ambulate

## 2016-09-07 NOTE — Discharge Instructions (Signed)
Take your medications as prescribed. I also recommend following the diet listed below and refraining from drinking alcohol to prevent exacerbation of your suspected gout flare. Follow-up with your primary care provider early next week for follow-up evaluation and further management. Please return to the Emergency Department if symptoms worsen or new onset of fever, redness, warmth, swelling, decreased range of motion, numbness, weakness, calf swelling/tenderness.

## 2016-09-07 NOTE — ED Provider Notes (Signed)
[facility] DEPT Provider Note   CSN: [id number] Arrival date & time: 09/07/16  1046     History   Chief Complaint Chief Complaint  Patient presents with  . Leg Swelling    HPI Daniel [name] is a 76 y.o. male.  HPI   Patient is a 76 year old male with history of hypertension and renal insufficiency who presents to the ED with complaint of right knee and ankle pain/swelling. Patient reports over the past 4 months he has had waxing and waning episodes of right knee and ankle pain and swelling. He also reports having similar pain to this right first MTP. Patient reports pain is worse with movement or when bearing weight. Denies any fall or injury prior to onset of symptoms. He notes he was initially seen by his PCP and February for similar symptoms, reported to have arthritic changes on right knee x-ray and was given Voltaren gel. Patient reports using a gel as prescribed with a very mild intermittent relief. He notes he has had 2 recent falls over the past week due to his right leg giving out on him to worsening pain with ambulating or bending his knee. Patient denies taking any medications at home for his symptoms. Denies fever, chest pain, shortness of breath, redness, warmth, numbness, weakness, rash. Denies history of gout. Denies any recent hospitalizations/surgeries/immobilization, any recent long car ride or airplane travel, history of DVT/PE, history of cancer. Patient reports he has been having to use a cane more frequently due to worsening pain.  Past Medical History:  Diagnosis Date  . Glaucoma   . Hypertension     Patient Active Problem List   Diagnosis Date Noted  . Renal insufficiency 02/25/2016  . HTN (hypertension) 12/15/2013    Past Surgical History:  Procedure Laterality Date  . EYE SURGERY     B cataract surgery. [name].       Home Medications    Prior to Admission medications   Medication Sig Start Date End Date Taking? Authorizing Provider    amLODipine (NORVASC) 10 MG tablet Take 1 tablet (10 mg total) by mouth daily. 02/25/16  Yes Wardell Honour, MD  aspirin 81 MG tablet Take 81 mg by mouth daily.   Yes Historical Provider, MD  diclofenac sodium (VOLTAREN) 1 % GEL Apply 4 g topically 4 (four) times daily. 06/28/16  Yes Wardell Honour, MD  ketoconazole (NIZORAL) 2 % cream Apply 1 application topically 2 (two) times daily. 06/28/16  Yes Wardell Honour, MD  lisinopril-hydrochlorothiazide (PRINZIDE,ZESTORETIC) 20-25 MG tablet Take 1 tablet by mouth daily. 02/25/16  Yes Wardell Honour, MD  colchicine 0.6 MG tablet Take 1.2mg  (two tablets) by mouth once, then take 0.6mg  (one tablet) an hour later and every 1-2 hours after until one of the following occurs: 1.  The pain is gone 2.  The maximum dose has been given (no more than 3 tabs in 3 hours or 10 tabs in 24 hours) 3.  The side effects outweight the benefits 09/07/16   Nona Dell, PA-C  HYDROcodone-acetaminophen (NORCO/VICODIN) 5-325 MG tablet Take 1 tablet by mouth every 4 (four) hours as needed. 09/07/16   Nona Dell, PA-C  loratadine (CLARITIN) 10 MG tablet Take 1 tablet (10 mg total) by mouth daily. Patient not taking: Reported on 06/28/2016 12/04/14   Wardell Honour, MD    Family History Family History  Problem Relation Age of Onset  . Diabetes Mother     Social History Social History  Substance Use Topics  . Smoking status: Never Smoker  . Smokeless tobacco: Never Used  . Alcohol use Yes     Comment: ocas     Allergies   Patient has no known allergies.   Review of Systems Review of Systems  Musculoskeletal: Positive for arthralgias (right knee, ankle, 1st MTP) and joint swelling.  All other systems reviewed and are negative.    Physical Exam Updated Vital Signs BP 134/84   Pulse (!) 54   Temp 98.5 F (36.9 C) (Oral)   Resp 18   SpO2 93%   Physical Exam  Constitutional: He is oriented to person, place, and time. He appears  well-developed and well-nourished.  HENT:  Head: Normocephalic and atraumatic.  Eyes: Conjunctivae and EOM are normal. Right eye exhibits no discharge. Left eye exhibits no discharge. No scleral icterus.  Neck: Normal range of motion. Neck supple.  Cardiovascular: Normal rate and intact distal pulses.   Pulmonary/Chest: Effort normal.  Abdominal: Soft. He exhibits no distension.  Musculoskeletal: Normal range of motion. He exhibits tenderness. He exhibits no edema or deformity.  Mild swelling present to right anterior/superior knee. FROM of knee with reported pain will full flexion and extension. TTP over right anterior/lateral knee. No erythema, warmth or effusion present.  Moderate swelling present to right ankle with diffuse TTP. No erythema, warmth present. TTP over right 1st MTP without swelling, erythema or warmth present.   FROM of right knee, ankle, foot and toes with 5/5 strength. Sensation grossly intact. 2+ DP pulse. Pt able to stand and ambulate with cane but endorses pain with bearing weight.  Neurological: He is alert and oriented to person, place, and time.  Skin: Skin is warm and dry.  Nursing note and vitals reviewed.    ED Treatments / Results  Labs (all labs ordered are listed, but only abnormal results are displayed) Labs Reviewed  I-STAT CHEM 8, ED - Abnormal; Notable for the following:       Result Value   BUN 21 (*)    Calcium, Ion 0.98 (*)    All other components within normal limits    EKG  EKG Interpretation None       Radiology Dg Ankle Complete Right  Result Date: 09/07/2016 CLINICAL DATA:  Ankle pain and swelling, no known injury, initial encounter EXAM: RIGHT ANKLE - COMPLETE 3+ VIEW COMPARISON:  None. FINDINGS: No acute fracture or dislocation is noted. Soft tissue changes are noted medially without acute abnormality. Calcaneal spurring is noted. IMPRESSION: Soft tissue swelling medially without acute bony abnormality. Electronically Signed    By: Inez Catalina M.D.   On: 09/07/2016 12:54   Dg Knee Complete 4 Views Right  Result Date: 09/07/2016 CLINICAL DATA:  Right knee swelling for 6 months.  Recent fall. EXAM: RIGHT KNEE - COMPLETE 4+ VIEW COMPARISON:  06/28/2016 FINDINGS: No acute fracture. No dislocation. Spurring at the superior and inferior patella. Mild tricompartment osteoarthritic change. Stable soft tissues. IMPRESSION: No acute bony pathology. Electronically Signed   By: Marybelle Killings M.D.   On: 09/07/2016 12:53   Dg Foot Complete Right  Result Date: 09/07/2016 CLINICAL DATA:  Right foot pain and swelling for several months, no known injury, initial encounter EXAM: RIGHT FOOT COMPLETE - 3+ VIEW COMPARISON:  None. FINDINGS: No acute fracture or dislocation is noted. Calcaneal spurring is seen. No soft tissue abnormality is noted. IMPRESSION: Chronic changes without acute abnormality. Electronically Signed   By: Inez Catalina M.D.   On: 09/07/2016 12:51  Procedures Procedures (including critical care time)  Medications Ordered in ED Medications - No data to display   Initial Impression / Assessment and Plan / ED Course  I have reviewed the triage vital signs and the nursing notes.  Pertinent labs & imaging results that were available during my care of the patient were reviewed by me and considered in my medical decision making (see chart for details).     Pt presents with right knee, ankle and 1st MTP joint pain swelling consistent with gout.  Pt is afebrile and stable. Imaging reviewed, no evidence of occult fracture or injury. Negative Wells. No bony abnormality or deformity, no erythema or excessive heat, no evidence of cellulitis, DVT, or septic joint. Pt without known peptic ulcer disease and not receiving concurrent treatment on warfarin. Renal function good. Cr 1.21, CrCl 70. Pt dc with colchicine and pain meds. Discussed that pt should respond to treatment with in 24 hour of begining treatment & likely resolve in  2-3 days.  Advise to follow up with PCP early next week for follow up evaluation. Discussed strict return precautions.    Final Clinical Impressions(s) / ED Diagnoses   Final diagnoses:  Acute gout of multiple sites, unspecified cause    New Prescriptions New Prescriptions   COLCHICINE 0.6 MG TABLET    Take 1.2mg  (two tablets) by mouth once, then take 0.6mg  (one tablet) an hour later and every 1-2 hours after until one of the following occurs: 1.  The pain is gone 2.  The maximum dose has been given (no more than 3 tabs in 3 hours or 10 tabs in 24 hours) 3.  The side effects outweight the benefits   HYDROCODONE-ACETAMINOPHEN (NORCO/VICODIN) 5-325 MG TABLET    Take 1 tablet by mouth every 4 (four) hours as needed.       Chesley Noon [place], [place] 09/07/16 [place], MD 09/07/16 [id number]

## 2016-09-07 NOTE — ED Notes (Signed)
Patient transported to X-ray 

## 2016-09-19 ENCOUNTER — Encounter: Payer: Self-pay | Admitting: Family Medicine

## 2016-09-19 ENCOUNTER — Ambulatory Visit (INDEPENDENT_AMBULATORY_CARE_PROVIDER_SITE_OTHER): Payer: Medicare Other | Admitting: Family Medicine

## 2016-09-19 VITALS — BP 137/67 | HR 73 | Temp 98.6°F | Resp 16 | Ht 69.5 in | Wt 235.6 lb

## 2016-09-19 DIAGNOSIS — I10 Essential (primary) hypertension: Secondary | ICD-10-CM | POA: Diagnosis not present

## 2016-09-19 DIAGNOSIS — M25561 Pain in right knee: Secondary | ICD-10-CM | POA: Diagnosis not present

## 2016-09-19 DIAGNOSIS — I517 Cardiomegaly: Secondary | ICD-10-CM

## 2016-09-19 DIAGNOSIS — E6609 Other obesity due to excess calories: Secondary | ICD-10-CM | POA: Diagnosis not present

## 2016-09-19 DIAGNOSIS — E66811 Obesity, class 1: Secondary | ICD-10-CM

## 2016-09-19 DIAGNOSIS — Z1211 Encounter for screening for malignant neoplasm of colon: Secondary | ICD-10-CM

## 2016-09-19 DIAGNOSIS — Z23 Encounter for immunization: Secondary | ICD-10-CM | POA: Diagnosis not present

## 2016-09-19 DIAGNOSIS — R601 Generalized edema: Secondary | ICD-10-CM | POA: Diagnosis not present

## 2016-09-19 DIAGNOSIS — N289 Disorder of kidney and ureter, unspecified: Secondary | ICD-10-CM | POA: Diagnosis not present

## 2016-09-19 DIAGNOSIS — Z0000 Encounter for general adult medical examination without abnormal findings: Secondary | ICD-10-CM | POA: Diagnosis not present

## 2016-09-19 DIAGNOSIS — Z6834 Body mass index (BMI) 34.0-34.9, adult: Secondary | ICD-10-CM

## 2016-09-19 DIAGNOSIS — J301 Allergic rhinitis due to pollen: Secondary | ICD-10-CM

## 2016-09-19 LAB — POCT URINALYSIS DIP (MANUAL ENTRY)
BILIRUBIN UA: NEGATIVE
BILIRUBIN UA: NEGATIVE mg/dL
Blood, UA: NEGATIVE
Glucose, UA: NEGATIVE mg/dL
LEUKOCYTES UA: NEGATIVE
Nitrite, UA: NEGATIVE
Protein Ur, POC: NEGATIVE mg/dL
Spec Grav, UA: 1.015 (ref 1.010–1.025)
Urobilinogen, UA: 0.2 E.U./dL
pH, UA: 5 (ref 5.0–8.0)

## 2016-09-19 MED ORDER — LORATADINE 10 MG PO TABS: 10.0000 mg | ORAL_TABLET | Freq: Every day | ORAL | 3 refills | Status: DC

## 2016-09-19 MED ORDER — COLCHICINE 0.6 MG PO TABS: ORAL_TABLET | ORAL | 2 refills | Status: DC

## 2016-09-19 MED ORDER — ZOSTER VAC RECOMB ADJUVANTED 50 MCG/0.5ML IM SUSR: 0.5000 mL | Freq: Once | INTRAMUSCULAR | 1 refills | Status: AC

## 2016-09-19 MED ORDER — LISINOPRIL-HYDROCHLOROTHIAZIDE 20-25 MG PO TABS: 1.0000 | ORAL_TABLET | Freq: Every day | ORAL | 1 refills | Status: DC

## 2016-09-19 MED ORDER — AMLODIPINE BESYLATE 10 MG PO TABS: 10.0000 mg | ORAL_TABLET | Freq: Every day | ORAL | 1 refills | Status: DC

## 2016-09-19 NOTE — Patient Instructions (Addendum)
   IF you received an x-ray today, you will receive an invoice from [facility]. Please contact [facility] at [phone] with questions or concerns regarding your invoice.   IF you received labwork today, you will receive an invoice from [facility]. Please contact [facility] at [phone] with questions or concerns regarding your invoice.   Our billing staff will not be able to assist you with questions regarding bills from these companies.  You will be contacted with the lab results as soon as they are available. The fastest way to get your results is to activate your My Chart account. Instructions are located on the last page of this paperwork. If you have not heard from us regarding the results in 2 weeks, please contact this office.      Preventive Care 75 Years and Older, Male Preventive care refers to lifestyle choices and visits with your health care provider that can promote health and wellness. What does preventive care include?  A yearly physical exam. This is also called an annual well check.  Dental exams once or twice a year.  Routine eye exams. Ask your health care provider how often you should have your eyes checked.  Personal lifestyle choices, including: ? Daily care of your teeth and gums. ? Regular physical activity. ? Eating a healthy diet. ? Avoiding tobacco and drug use. ? Limiting alcohol use. ? Practicing safe sex. ? Taking low doses of aspirin every day. ? Taking vitamin and mineral supplements as recommended by your health care provider. What happens during an annual well check? The services and screenings done by your health care provider during your annual well check will depend on your age, overall health, lifestyle risk factors, and family history of disease. Counseling Your health care provider may ask you questions about your:  Alcohol use.  Tobacco use.  Drug use.  Emotional well-being.  Home and relationship  well-being.  Sexual activity.  Eating habits.  History of falls.  Memory and ability to understand (cognition).  Work and work environment.  Screening You may have the following tests or measurements:  Height, weight, and BMI.  Blood pressure.  Lipid and cholesterol levels. These may be checked every 5 years, or more frequently if you are over 50 years old.  Skin check.  Lung cancer screening. You may have this screening every year starting at age 55 if you have a 30-pack-year history of smoking and currently smoke or have quit within the past 15 years.  Fecal occult blood test (FOBT) of the stool. You may have this test every year starting at age 50.  Flexible sigmoidoscopy or colonoscopy. You may have a sigmoidoscopy every 5 years or a colonoscopy every 10 years starting at age 50.  Prostate cancer screening. Recommendations will vary depending on your family history and other risks.  Hepatitis C blood test.  Hepatitis B blood test.  Sexually transmitted disease (STD) testing.  Diabetes screening. This is done by checking your blood sugar (glucose) after you have not eaten for a while (fasting). You may have this done every 1-3 years.  Abdominal aortic aneurysm (AAA) screening. You may need this if you are a current or former smoker.  Osteoporosis. You may be screened starting at age 70 if you are at high risk.  Talk with your health care provider about your test results, treatment options, and if necessary, the need for more tests. Vaccines Your health care provider may recommend certain vaccines, such as:  Influenza vaccine. This   is recommended every year.  Tetanus, diphtheria, and acellular pertussis (Tdap, Td) vaccine. You may need a Td booster every 10 years.  Varicella vaccine. You may need this if you have not been vaccinated.  Zoster vaccine. You may need this after age 60.  Measles, mumps, and rubella (MMR) vaccine. You may need at least one dose of  MMR if you were born in 1957 or later. You may also need a second dose.  Pneumococcal 13-valent conjugate (PCV13) vaccine. One dose is recommended after age 65.  Pneumococcal polysaccharide (PPSV23) vaccine. One dose is recommended after age 65.  Meningococcal vaccine. You may need this if you have certain conditions.  Hepatitis A vaccine. You may need this if you have certain conditions or if you travel or work in places where you may be exposed to hepatitis A.  Hepatitis B vaccine. You may need this if you have certain conditions or if you travel or work in places where you may be exposed to hepatitis B.  Haemophilus influenzae type b (Hib) vaccine. You may need this if you have certain risk factors.  Talk to your health care provider about which screenings and vaccines you need and how often you need them. This information is not intended to replace advice given to you by your health care provider. Make sure you discuss any questions you have with your health care provider. Document Released: 05/28/2015 Document Revised: 01/19/2016 Document Reviewed: 03/02/2015 Elsevier Interactive Patient Education  2017 [employer]  

## 2016-09-19 NOTE — Progress Notes (Signed)
Subjective:    Patient ID: Daniel [name], male    DOB: 1940-06-11, 76 y.o.   MRN: [id number]  09/19/2016  Follow-up (Right Knee Pain) and Medication Management (Pt states he needs Colchicine refilled and that Norco is too strong for him)   HPI This 76 y.o. male presents for  [place] and evaluation of persistent RIGHT knee pain; s/p ED evaluation for acute worsening R knee pain following fall.  Evaluated on 06/28/16 for R knee pain; prescribed voltaren gel and underwent xray.  Diagnosed with gout; prescribed colchicine and hydrocodone.  No aspiration performed.  Colchicine seemed to work for pain.  Hdyrocodone was too strong; in [place] with 1/2 tablet.  Having very mild pain now.  Has cane to ambulate with today.  Swelling much improved now; swelling improved with colchicine.  Took two colchicine at once; repeat one hour later if pain persists.  Ran out of colchicine.  If going down stairs, knee will give way.  If going up hill, will feel unstable.  Asymptomatic until past two weeks.  Was getting out of the car and hit leg against door three weeks ago.  Acute onset of R knee pain.    After last visit in 06/2016, R knee pain improved. Then recently daughter came to house and noticed pt limping; went to ED; really tender on exam.    Last physical: Colonoscopy: 2004 hemorrhoids; Henrene Pastor; repeat in 5 years.   HTN: Patient reports good compliance with medication, good tolerance to medication, and good symptom control.    Immunization History  Administered Date(s) Administered  . Influenza,inj,Quad PF,36+ Mos 01/28/2014, 06/28/2016  . Pneumococcal Conjugate-13 01/28/2014  . Pneumococcal Polysaccharide-23 12/04/2014   BP Readings from Last 3 Encounters:  09/19/16 137/67  09/07/16 134/84  06/28/16 114/64   Wt Readings from Last 3 Encounters:  09/19/16 235 lb 9.6 oz (106.9 kg)  06/28/16 231 lb (104.8 kg)  02/25/16 238 lb 12.8 oz (108.3 kg)    Review of Systems    Constitutional: Negative for activity change, appetite change, chills, diaphoresis, fatigue and fever.  Respiratory: Negative for cough and shortness of breath.   Cardiovascular: Negative for chest pain, palpitations and leg swelling.  Gastrointestinal: Negative for abdominal pain, diarrhea, nausea and vomiting.  Endocrine: Negative for cold intolerance, heat intolerance, polydipsia, polyphagia and polyuria.  Musculoskeletal: Positive for arthralgias, gait problem and joint swelling.  Skin: Negative for color change, rash and wound.  Neurological: Negative for dizziness, tremors, seizures, syncope, facial asymmetry, speech difficulty, weakness, light-headedness, numbness and headaches.  Psychiatric/Behavioral: Negative for dysphoric mood and sleep disturbance. The patient is not nervous/anxious.     Past Medical History:  Diagnosis Date  . Chronic renal insufficiency   . Glaucoma   . Hypertension    Past Surgical History:  Procedure Laterality Date  . EYE SURGERY     B cataract surgery. [name].   No Known Allergies  Social History   Social History  . Marital status: Married    Spouse name: N/A  . Number of children: N/A  . Years of education: N/A   Occupational History  . Not on file.   Social History Main Topics  . Smoking status: Never Smoker  . Smokeless tobacco: Never Used  . Alcohol use Yes     Comment: ocas  . Drug use: No  . Sexual activity: Not on file   Other Topics Concern  . Not on file   Social History Narrative   Marital status:  widowed since 2010; married x 37 years. Not dating in 2018.      Children: 3 daughters; no sons; 7 grandchildren; 2 gg.      Lives: alone in house in neighborhood.     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       Tobacco:  Quit smoking 30 years ago.      Alcohol: weekends. Scotch.      Exercise: sporadic; cuts grass and trims hedges. Stays busy in yard.        Seatbelt:  100%      Guns: none       ADLs: drives; independent ADLs; buys groceries; pays bills.  Cut grass.  NO cane or assistant devices.      Advanced Directives: yes; No CPR; DNI/DNR.              Family History  Problem Relation Age of Onset  . Diabetes Mother        Objective:    BP 137/67   Pulse 73   Temp 98.6 F (37 C) (Oral)   Resp 16   Ht 5' 9.5" (1.765 m)   Wt 235 lb 9.6 oz (106.9 kg)   SpO2 95%   BMI 34.29 kg/m  Physical Exam  Constitutional: He is oriented to person, place, and time. He appears well-developed and well-nourished. No distress.  HENT:  Head: Normocephalic and atraumatic.  Right Ear: External ear normal.  Left Ear: External ear normal.  Nose: Nose normal.  Mouth/Throat: Oropharynx is clear and moist.  Eyes: Conjunctivae and EOM are normal. Pupils are equal, round, and reactive to light.  Neck: Normal range of motion. Neck supple. Carotid bruit is not present. No thyromegaly present.  Cardiovascular: Normal rate, regular rhythm, normal heart sounds and intact distal pulses.  Exam reveals no gallop and no friction rub.   No murmur heard. Pulmonary/Chest: Effort normal and breath sounds normal. He has no wheezes. He has no rales.  Abdominal: Soft. Bowel sounds are normal. He exhibits no distension and no mass. There is no tenderness. There is no rebound and no guarding.  Musculoskeletal:       Right knee: He exhibits normal range of motion, no swelling and no effusion. No tenderness found. No medial joint line, no lateral joint line and no patellar tendon tenderness noted.       Right lower leg: Normal. He exhibits no tenderness, no bony tenderness, no swelling, no edema, no deformity and no laceration.  Lymphadenopathy:    He has no cervical adenopathy.  Neurological: He is alert and oriented to person, place, and time. No cranial nerve deficit.  Skin: Skin is warm and dry. No rash noted. He is not diaphoretic.  Psychiatric: He has a normal mood and affect. His behavior is normal.   Nursing note and vitals reviewed.  Depression screen [facility] 2/9 09/19/2016 06/28/2016 02/25/2016 12/04/2014 01/28/2014  Decreased Interest 0 0 0 0 0  Down, Depressed, Hopeless 0 0 0 0 0  PHQ - 2 Score 0 0 0 0 0   Fall Risk  09/19/2016 06/28/2016 02/25/2016 12/04/2014 01/28/2014  Falls in the past year? No No No No No   Functional Status Survey: Is the patient deaf or have difficulty hearing?: No Does the patient have difficulty seeing, even when wearing glasses/contacts?: No Does the patient have difficulty concentrating, remembering, or making decisions?: No Does the patient have difficulty walking or climbing stairs?: No Does the patient have difficulty dressing or bathing?:  No Does the patient have difficulty doing errands alone such as visiting a doctor's office or shopping?: No      Assessment & Plan:   1. Encounter for Medicare annual wellness exam   2. Recurrent pain of right knee   3. Essential hypertension   4. Renal insufficiency   5. Colon cancer screening   6. Need for shingles vaccine   7. LVH (left ventricular hypertrophy)   8. Generalized edema   9. Seasonal allergic rhinitis due to pollen   10. Class 1 obesity due to excess calories with serious comorbidity and body mass index (BMI) of 34.0 to 34.9 in adult    -anticipatory guidance provided --- exercise, weight loss, safe driving practices, aspirin 81mg  daily. -obtain age appropriate screening labs and labs for chronic disease management. -moderate fall risk; no evidence of depression; no evidence of hearing loss.  Discussed advanced directives and living will; also discussed end of life issues including code status.  -refer to GI for last colonoscopy -s/p recurrent R knee pain; diagnosed with gout yet no fluid analysis performed; obtain uric acid level.  Symptoms did improve with Colchicine so refill provided.  Pt declined referral to ortho as clinically improved. -rx for Shingrix provided.   Orders Placed This  Encounter  Procedures  . Uric Acid  . Comprehensive metabolic panel  . Ambulatory referral to Gastroenterology    Referral Priority:   Routine    Referral Type:   Consultation    Referral Reason:   Specialty Services Required    Number of Visits Requested:   1  . POCT urinalysis dipstick   Meds ordered this encounter  Medications  . colchicine 0.6 MG tablet    Sig: 1 tablet tid for 5 days with onset of gout symptoms    Dispense:  15 tablet    Refill:  2  . Zoster Vac Recomb Adjuvanted [facility]) injection    Sig: Inject 0.5 mLs into the muscle once.    Dispense:  0.5 mL    Refill:  1  . lisinopril-hydrochlorothiazide (PRINZIDE,ZESTORETIC) 20-25 MG tablet    Sig: Take 1 tablet by mouth daily.    Dispense:  90 tablet    Refill:  1  . amLODipine (NORVASC) 10 MG tablet    Sig: Take 1 tablet (10 mg total) by mouth daily.    Dispense:  90 tablet    Refill:  1  . loratadine (CLARITIN) 10 MG tablet    Sig: Take 1 tablet (10 mg total) by mouth daily.    Dispense:  90 tablet    Refill:  3    Return in about 6 months (around 03/22/2017) for recheck blood pressure.   Philis Doke Elayne Guerin, M.D. Primary Care at [facility] [place] [address] [place],   [address] [phone] phone [phone] fax

## 2016-09-20 ENCOUNTER — Encounter: Payer: Self-pay | Admitting: Internal Medicine

## 2016-09-20 LAB — COMPREHENSIVE METABOLIC PANEL
ALT: 23 IU/L (ref 0–44)
AST: 19 IU/L (ref 0–40)
Albumin/Globulin Ratio: 1.5 (ref 1.2–2.2)
Albumin: 4.6 g/dL (ref 3.5–4.8)
Alkaline Phosphatase: 63 IU/L (ref 39–117)
BUN/Creatinine Ratio: 13 (ref 10–24)
BUN: 17 mg/dL (ref 8–27)
Bilirubin Total: 0.3 mg/dL (ref 0.0–1.2)
CALCIUM: 9.8 mg/dL (ref 8.6–10.2)
CO2: 30 mmol/L — AB (ref 18–29)
CREATININE: 1.35 mg/dL — AB (ref 0.76–1.27)
Chloride: 98 mmol/L (ref 96–106)
GFR calc Af Amer: 59 mL/min/{1.73_m2} — ABNORMAL LOW (ref >59)
GFR, EST NON AFRICAN AMERICAN: 51 mL/min/{1.73_m2} — AB (ref >59)
GLUCOSE: 79 mg/dL (ref 65–99)
Globulin, Total: 3.1 g/dL (ref 1.5–4.5)
Potassium: 3.8 mmol/L (ref 3.5–5.2)
Sodium: 146 mmol/L — ABNORMAL HIGH (ref 134–144)
Total Protein: 7.7 g/dL (ref 6.0–8.5)

## 2016-09-20 LAB — URIC ACID: Uric Acid: 11.5 mg/dL — ABNORMAL HIGH (ref 3.7–8.6)

## 2016-10-09 DIAGNOSIS — Z6836 Body mass index (BMI) 36.0-36.9, adult: Secondary | ICD-10-CM | POA: Insufficient documentation

## 2016-10-09 DIAGNOSIS — I517 Cardiomegaly: Secondary | ICD-10-CM | POA: Insufficient documentation

## 2016-10-09 DIAGNOSIS — E6609 Other obesity due to excess calories: Secondary | ICD-10-CM | POA: Insufficient documentation

## 2016-10-09 DIAGNOSIS — Z6834 Body mass index (BMI) 34.0-34.9, adult: Secondary | ICD-10-CM

## 2016-10-09 DIAGNOSIS — J301 Allergic rhinitis due to pollen: Secondary | ICD-10-CM | POA: Insufficient documentation

## 2016-11-24 ENCOUNTER — Ambulatory Visit (AMBULATORY_SURGERY_CENTER): Payer: Self-pay

## 2016-11-24 VITALS — Ht 70.5 in | Wt 230.4 lb

## 2016-11-24 DIAGNOSIS — Z1211 Encounter for screening for malignant neoplasm of colon: Secondary | ICD-10-CM

## 2016-11-24 MED ORDER — SUPREP BOWEL PREP KIT 17.5-3.13-1.6 GM/177ML PO SOLN: 1.0000 | Freq: Once | ORAL | 0 refills | Status: AC

## 2016-11-24 NOTE — Progress Notes (Signed)
No allergies to eggs or soy No diet meds No home oxygen No past problems with anesthesia  Declined emmi 

## 2016-11-27 ENCOUNTER — Telehealth: Payer: Self-pay | Admitting: Internal Medicine

## 2016-11-27 NOTE — Telephone Encounter (Signed)
Spoke with Jolyn Nap, daughter of pt.  Discussed Miralax instructions for alternative prep.  Printed off new insructions and left with receptionist on 4th floor. Angela/PV

## 2016-12-04 ENCOUNTER — Ambulatory Visit (AMBULATORY_SURGERY_CENTER): Payer: Medicare Other | Admitting: Internal Medicine

## 2016-12-04 ENCOUNTER — Encounter: Payer: Self-pay | Admitting: Internal Medicine

## 2016-12-04 VITALS — BP 104/67 | HR 65 | Temp 98.6°F | Resp 11 | Ht 70.0 in | Wt 230.0 lb

## 2016-12-04 DIAGNOSIS — Z1211 Encounter for screening for malignant neoplasm of colon: Secondary | ICD-10-CM | POA: Diagnosis not present

## 2016-12-04 DIAGNOSIS — K621 Rectal polyp: Secondary | ICD-10-CM | POA: Diagnosis not present

## 2016-12-04 DIAGNOSIS — Z1212 Encounter for screening for malignant neoplasm of rectum: Secondary | ICD-10-CM | POA: Diagnosis not present

## 2016-12-04 DIAGNOSIS — D128 Benign neoplasm of rectum: Secondary | ICD-10-CM

## 2016-12-04 DIAGNOSIS — I10 Essential (primary) hypertension: Secondary | ICD-10-CM | POA: Diagnosis not present

## 2016-12-04 MED ORDER — SODIUM CHLORIDE 0.9 % IV SOLN: 500.0000 mL | INTRAVENOUS | Status: AC

## 2016-12-04 NOTE — Progress Notes (Signed)
Pt's states no medical or surgical changes since previsit or office visit. 

## 2016-12-04 NOTE — Op Note (Signed)
[facility] Patient Name: Daniel [name] Procedure Date: 12/04/2016 11:16 AM MRN: [id number] Endoscopist: Docia Chuck. Henrene Pastor , MD Age: 76 Referring MD:  Date of Birth: 1940-12-17 Gender: Male Account #: [id number] Procedure:                Colonoscopy, with cold snare polypectomy x 1 Indications:              Screening for colorectal malignant neoplasm.                            Negative index exam 2004 Medicines:                Monitored Anesthesia Care Procedure:                Pre-Anesthesia Assessment:                           - Prior to the procedure, a History and Physical                            was performed, and patient medications and                            allergies were reviewed. The patient's tolerance of                            previous anesthesia was also reviewed. The risks                            and benefits of the procedure and the sedation                            options and risks were discussed with the patient.                            All questions were answered, and informed consent                            was obtained. Prior Anticoagulants: The patient has                            taken no previous anticoagulant or antiplatelet                            agents. ASA Grade Assessment: II - A patient with                            mild systemic disease. After reviewing the risks                            and benefits, the patient was deemed in                            satisfactory condition to undergo the procedure.  After obtaining informed consent, the colonoscope                            was passed under direct vision. Throughout the                            procedure, the patient's blood pressure, pulse, and                            oxygen saturations were monitored continuously. The                            Colonoscope was introduced through the anus and                            advanced to  the the cecum, identified by                            appendiceal orifice and ileocecal valve. The                            ileocecal valve, appendiceal orifice, and rectum                            were photographed. The quality of the bowel                            preparation was excellent. The colonoscopy was                            performed without difficulty. The patient tolerated                            the procedure well. The bowel preparation used was                            SUPREP. Scope In: 11:24:36 AM Scope Out: 11:41:43 AM Scope Withdrawal Time: 0 hours 12 minutes 2 seconds  Total Procedure Duration: 0 hours 17 minutes 7 seconds  Findings:                 A 3 mm polyp was found in the rectum. The polyp was                            removed with a cold snare. Resection and retrieval                            were complete.                           Internal hemorrhoids were found during retroflexion.                           The exam was otherwise without abnormality on  direct and retroflexion views. Complications:            No immediate complications. Estimated blood loss:                            None. Estimated Blood Loss:     Estimated blood loss: none. Impression:               - One 3 mm polyp in the rectum, removed with a cold                            snare. Resected and retrieved.                           - Internal hemorrhoids.                           - The examination was otherwise normal on direct                            and retroflexion views. Recommendation:           - Repeat colonoscopy is not recommended for                            surveillance.                           - Patient has a contact number available for                            emergencies. The signs and symptoms of potential                            delayed complications were discussed with the                            patient.  Return to normal activities tomorrow.                            Written discharge instructions were provided to the                            patient.                           - Resume previous diet.                           - Continue present medications.                           - Await pathology results. Docia Chuck. Henrene Pastor, MD 12/04/2016 11:45:32 AM This report has been signed electronically.

## 2016-12-04 NOTE — Progress Notes (Signed)
Called to room to assist during endoscopic procedure.  Patient ID and intended procedure confirmed with present staff. Received instructions for my participation in the procedure from the performing physician.  

## 2016-12-04 NOTE — Progress Notes (Signed)
No problems noted in the recovery room. maw 

## 2016-12-04 NOTE — Progress Notes (Signed)
Report to PACU, RN, vss, BBS= Clear.  

## 2016-12-04 NOTE — Patient Instructions (Signed)
YOU HAD AN ENDOSCOPIC PROCEDURE TODAY AT THE [facility]:   Refer to the procedure report that was given to you for any specific questions about what was found during the examination.  If the procedure report does not answer your questions, please call your gastroenterologist to clarify.  If you requested that your care partner not be given the details of your procedure findings, then the procedure report has been included in a sealed envelope for you to review at your convenience later.  YOU SHOULD EXPECT: Some feelings of bloating in the abdomen. Passage of more gas than usual.  Walking can help get rid of the air that was put into your GI tract during the procedure and reduce the bloating. If you had a lower endoscopy (such as a colonoscopy or flexible sigmoidoscopy) you may notice spotting of blood in your stool or on the toilet paper. If you underwent a bowel prep for your procedure, you may not have a normal bowel movement for a few days.  Please Note:  You might notice some irritation and congestion in your nose or some drainage.  This is from the oxygen used during your procedure.  There is no need for concern and it should clear up in a day or so.  SYMPTOMS TO REPORT IMMEDIATELY:   Following lower endoscopy (colonoscopy or flexible sigmoidoscopy):  Excessive amounts of blood in the stool  Significant tenderness or worsening of abdominal pains  Swelling of the abdomen that is new, acute  Fever of 100F or higher   For urgent or emergent issues, a gastroenterologist can be reached at any hour by calling [phone].   DIET:  We do recommend a small meal at first, but then you may proceed to your regular diet.  Drink plenty of fluids but you should avoid alcoholic beverages for 24 hours.  ACTIVITY:  You should plan to take it easy for the rest of today and you should NOT DRIVE or use heavy machinery until tomorrow (because of the sedation medicines used during the test).     FOLLOW UP: Our staff will call the number listed on your records the next business day following your procedure to check on you and address any questions or concerns that you may have regarding the information given to you following your procedure. If we do not reach you, we will leave a message.  However, if you are feeling well and you are not experiencing any problems, there is no need to return our call.  We will assume that you have returned to your regular daily activities without incident.  If any biopsies were taken you will be contacted by phone or by letter within the next 1-3 weeks.  Please call us at [phone] if you have not heard about the biopsies in 3 weeks.    SIGNATURES/CONFIDENTIALITY: You and/or your care partner have signed paperwork which will be entered into your electronic medical record.  These signatures attest to the fact that that the information above on your After Visit Summary has been reviewed and is understood.  Full responsibility of the confidentiality of this discharge information lies with you and/or your care-partner.    Handouts were given to your care partner on polyps and hemorrhoids. You may resume your current medications today. Await biopsy results. Please call if any questions or concerns.   

## 2016-12-05 ENCOUNTER — Telehealth: Payer: Self-pay | Admitting: *Deleted

## 2016-12-05 NOTE — Telephone Encounter (Signed)
  Follow up Call-  Call back number 12/04/2016  Post procedure Call Back phone  # [phone]  Permission to leave phone message Yes  Some recent data might be hidden     Patient questions:  Do you have a fever, pain , or abdominal swelling? No. Pain Score  0 *  Have you tolerated food without any problems? Yes.    Have you been able to return to your normal activities? Yes.    Do you have any questions about your discharge instructions: Diet   No. Medications  No. Follow up visit  No.  Do you have questions or concerns about your Care? No.  Actions: * If pain score is 4 or above: No action needed, pain <4.

## 2016-12-07 ENCOUNTER — Encounter: Payer: Self-pay | Admitting: Internal Medicine

## 2016-12-12 ENCOUNTER — Other Ambulatory Visit: Payer: Self-pay | Admitting: Family Medicine

## 2016-12-12 DIAGNOSIS — I10 Essential (primary) hypertension: Secondary | ICD-10-CM

## 2016-12-12 DIAGNOSIS — I517 Cardiomegaly: Secondary | ICD-10-CM

## 2016-12-12 DIAGNOSIS — R601 Generalized edema: Secondary | ICD-10-CM

## 2017-03-27 ENCOUNTER — Ambulatory Visit: Payer: Medicare Other | Admitting: Family Medicine

## 2017-07-05 ENCOUNTER — Telehealth: Payer: Self-pay | Admitting: Family Medicine

## 2017-07-05 DIAGNOSIS — R601 Generalized edema: Secondary | ICD-10-CM

## 2017-07-05 DIAGNOSIS — I517 Cardiomegaly: Secondary | ICD-10-CM

## 2017-07-05 DIAGNOSIS — I10 Essential (primary) hypertension: Secondary | ICD-10-CM

## 2017-07-05 MED ORDER — AMLODIPINE BESYLATE 10 MG PO TABS: 10.0000 mg | ORAL_TABLET | Freq: Every day | ORAL | 1 refills | Status: DC

## 2017-07-05 MED ORDER — LISINOPRIL-HYDROCHLOROTHIAZIDE 20-25 MG PO TABS: 1.0000 | ORAL_TABLET | Freq: Every day | ORAL | 1 refills | Status: DC

## 2017-07-05 NOTE — Telephone Encounter (Signed)
Copied from [place] [id number]. Topic: Quick Communication - Rx Refill/Question >> Jul 05, 2017 12:28 PM Ether Griffins B wrote: Medication: amlodipine and lisinopril    Has the patient contacted their pharmacy? Yes.     (Agent: If no, request that the patient contact the pharmacy for the refill.)   Preferred Pharmacy (with phone number or street name): [facility]   Agent: Please be advised that RX refills may take up to 3 business days. We ask that you follow-up with your pharmacy.

## 2017-07-20 ENCOUNTER — Ambulatory Visit: Payer: Self-pay | Admitting: Family Medicine

## 2017-10-10 ENCOUNTER — Encounter: Payer: Self-pay | Admitting: Family Medicine

## 2017-11-19 ENCOUNTER — Other Ambulatory Visit: Payer: Self-pay | Admitting: Family Medicine

## 2017-11-19 DIAGNOSIS — I517 Cardiomegaly: Secondary | ICD-10-CM

## 2017-11-19 DIAGNOSIS — R601 Generalized edema: Secondary | ICD-10-CM

## 2017-11-19 DIAGNOSIS — I10 Essential (primary) hypertension: Secondary | ICD-10-CM

## 2018-02-21 ENCOUNTER — Ambulatory Visit (INDEPENDENT_AMBULATORY_CARE_PROVIDER_SITE_OTHER): Payer: Medicare Other | Admitting: Emergency Medicine

## 2018-02-21 ENCOUNTER — Other Ambulatory Visit: Payer: Self-pay

## 2018-02-21 ENCOUNTER — Encounter: Payer: Self-pay | Admitting: Emergency Medicine

## 2018-02-21 VITALS — BP 193/80 | HR 90 | Temp 98.9°F | Resp 16 | Wt 240.2 lb

## 2018-02-21 DIAGNOSIS — Z23 Encounter for immunization: Secondary | ICD-10-CM | POA: Diagnosis not present

## 2018-02-21 DIAGNOSIS — I517 Cardiomegaly: Secondary | ICD-10-CM | POA: Diagnosis not present

## 2018-02-21 DIAGNOSIS — I10 Essential (primary) hypertension: Secondary | ICD-10-CM

## 2018-02-21 DIAGNOSIS — N289 Disorder of kidney and ureter, unspecified: Secondary | ICD-10-CM

## 2018-02-21 LAB — COMPREHENSIVE METABOLIC PANEL
ALT: 23 IU/L (ref 0–44)
AST: 19 IU/L (ref 0–40)
Albumin/Globulin Ratio: 1.1 — ABNORMAL LOW (ref 1.2–2.2)
Albumin: 3.9 g/dL (ref 3.5–4.8)
Alkaline Phosphatase: 63 IU/L (ref 39–117)
BUN/Creatinine Ratio: 10 (ref 10–24)
BUN: 12 mg/dL (ref 8–27)
Bilirubin Total: 0.3 mg/dL (ref 0.0–1.2)
CALCIUM: 9.1 mg/dL (ref 8.6–10.2)
CO2: 26 mmol/L (ref 20–29)
CREATININE: 1.18 mg/dL (ref 0.76–1.27)
Chloride: 101 mmol/L (ref 96–106)
GFR, EST AFRICAN AMERICAN: 69 mL/min/{1.73_m2} (ref >59)
GFR, EST NON AFRICAN AMERICAN: 60 mL/min/{1.73_m2} (ref >59)
GLUCOSE: 208 mg/dL — AB (ref 65–99)
Globulin, Total: 3.5 g/dL (ref 1.5–4.5)
Potassium: 3.7 mmol/L (ref 3.5–5.2)
Sodium: 143 mmol/L (ref 134–144)
TOTAL PROTEIN: 7.4 g/dL (ref 6.0–8.5)

## 2018-02-21 MED ORDER — LISINOPRIL-HYDROCHLOROTHIAZIDE 20-25 MG PO TABS: 1.0000 | ORAL_TABLET | Freq: Every day | ORAL | 3 refills | Status: DC

## 2018-02-21 MED ORDER — AMLODIPINE BESYLATE 10 MG PO TABS: 10.0000 mg | ORAL_TABLET | Freq: Every day | ORAL | 0 refills | Status: DC

## 2018-02-21 MED ORDER — AMLODIPINE BESYLATE 10 MG PO TABS: 10.0000 mg | ORAL_TABLET | Freq: Every day | ORAL | 3 refills | Status: DC

## 2018-02-21 MED ORDER — LISINOPRIL-HYDROCHLOROTHIAZIDE 20-25 MG PO TABS: 1.0000 | ORAL_TABLET | Freq: Every day | ORAL | 0 refills | Status: DC

## 2018-02-21 NOTE — Patient Instructions (Addendum)
   If you have lab work done today you will be contacted with your lab results within the next 2 weeks.  If you have not heard from us then please contact us. The fastest way to get your results is to register for My Chart.   IF you received an x-ray today, you will receive an invoice from [facility]. Please contact [facility] at [phone] with questions or concerns regarding your invoice.   IF you received labwork today, you will receive an invoice from [facility]. Please contact [facility] at [phone] with questions or concerns regarding your invoice.   Our billing staff will not be able to assist you with questions regarding bills from these companies.  You will be contacted with the lab results as soon as they are available. The fastest way to get your results is to activate your My Chart account. Instructions are located on the last page of this paperwork. If you have not heard from us regarding the results in 2 weeks, please contact this office.     Hypertension Hypertension, commonly called high blood pressure, is when the force of blood pumping through the arteries is too strong. The arteries are the blood vessels that carry blood from the heart throughout the body. Hypertension forces the heart to work harder to pump blood and may cause arteries to become narrow or stiff. Having untreated or uncontrolled hypertension can cause heart attacks, strokes, kidney disease, and other problems. A blood pressure reading consists of a higher number over a lower number. Ideally, your blood pressure should be below 120/80. The first ("top") number is called the systolic pressure. It is a measure of the pressure in your arteries as your heart beats. The second ("bottom") number is called the diastolic pressure. It is a measure of the pressure in your arteries as the heart relaxes. What are the causes? The cause of this condition is not known. What increases the risk? Some  risk factors for high blood pressure are under your control. Others are not. Factors you can change  Smoking.  Having type 2 diabetes mellitus, high cholesterol, or both.  Not getting enough exercise or physical activity.  Being overweight.  Having too much fat, sugar, calories, or salt (sodium) in your diet.  Drinking too much alcohol. Factors that are difficult or impossible to change  Having chronic kidney disease.  Having a family history of high blood pressure.  Age. Risk increases with age.  Race. You may be at higher risk if you are African-American.  Gender. Men are at higher risk than women before age 45. After age 65, women are at higher risk than men.  Having obstructive sleep apnea.  Stress. What are the signs or symptoms? Extremely high blood pressure (hypertensive crisis) may cause:  Headache.  Anxiety.  Shortness of breath.  Nosebleed.  Nausea and vomiting.  Severe chest pain.  Jerky movements you cannot control (seizures).  How is this diagnosed? This condition is diagnosed by measuring your blood pressure while you are seated, with your arm resting on a surface. The cuff of the blood pressure monitor will be placed directly against the skin of your upper arm at the level of your heart. It should be measured at least twice using the same arm. Certain conditions can cause a difference in blood pressure between your right and left arms. Certain factors can cause blood pressure readings to be lower or higher than normal (elevated) for a short period of time:  When   your blood pressure is higher when you are in a health care provider's office than when you are at home, this is called white coat hypertension. Most people with this condition do not need medicines.  When your blood pressure is higher at home than when you are in a health care provider's office, this is called masked hypertension. Most people with this condition may need medicines to control  blood pressure.  If you have a high blood pressure reading during one visit or you have normal blood pressure with other risk factors:  You may be asked to return on a different day to have your blood pressure checked again.  You may be asked to monitor your blood pressure at home for 1 week or longer.  If you are diagnosed with hypertension, you may have other blood or imaging tests to help your health care provider understand your overall risk for other conditions. How is this treated? This condition is treated by making healthy lifestyle changes, such as eating healthy foods, exercising more, and reducing your alcohol intake. Your health care provider may prescribe medicine if lifestyle changes are not enough to get your blood pressure under control, and if:  Your systolic blood pressure is above 130.  Your diastolic blood pressure is above 80.  Your personal target blood pressure may vary depending on your medical conditions, your age, and other factors. Follow these instructions at home: Eating and drinking  Eat a diet that is high in fiber and potassium, and low in sodium, added sugar, and fat. An example eating plan is called the DASH (Dietary Approaches to Stop Hypertension) diet. To eat this way: ? Eat plenty of fresh fruits and vegetables. Try to fill half of your plate at each meal with fruits and vegetables. ? Eat whole grains, such as whole wheat pasta, brown rice, or whole grain bread. Fill about one quarter of your plate with whole grains. ? Eat or drink low-fat dairy products, such as skim milk or low-fat yogurt. ? Avoid fatty cuts of meat, processed or cured meats, and poultry with skin. Fill about one quarter of your plate with lean proteins, such as fish, chicken without skin, beans, eggs, and tofu. ? Avoid premade and processed foods. These tend to be higher in sodium, added sugar, and fat.  Reduce your daily sodium intake. Most people with hypertension should eat less  than 1,500 mg of sodium a day.  Limit alcohol intake to no more than 1 drink a day for nonpregnant women and 2 drinks a day for men. One drink equals 12 oz of beer, 5 oz of wine, or 1 oz of hard liquor. Lifestyle  Work with your health care provider to maintain a healthy body weight or to lose weight. Ask what an ideal weight is for you.  Get at least 30 minutes of exercise that causes your heart to beat faster (aerobic exercise) most days of the week. Activities may include walking, swimming, or biking.  Include exercise to strengthen your muscles (resistance exercise), such as pilates or lifting weights, as part of your weekly exercise routine. Try to do these types of exercises for 30 minutes at least 3 days a week.  Do not use any products that contain nicotine or tobacco, such as cigarettes and e-cigarettes. If you need help quitting, ask your health care provider.  Monitor your blood pressure at home as told by your health care provider.  Keep all follow-up visits as told by your health care provider.   This is important. Medicines  Take over-the-counter and prescription medicines only as told by your health care provider. Follow directions carefully. Blood pressure medicines must be taken as prescribed.  Do not skip doses of blood pressure medicine. Doing this puts you at risk for problems and can make the medicine less effective.  Ask your health care provider about side effects or reactions to medicines that you should watch for. Contact a health care provider if:  You think you are having a reaction to a medicine you are taking.  You have headaches that keep coming back (recurring).  You feel dizzy.  You have swelling in your ankles.  You have trouble with your vision. Get help right away if:  You develop a severe headache or confusion.  You have unusual weakness or numbness.  You feel faint.  You have severe pain in your chest or abdomen.  You vomit  repeatedly.  You have trouble breathing. Summary  Hypertension is when the force of blood pumping through your arteries is too strong. If this condition is not controlled, it may put you at risk for serious complications.  Your personal target blood pressure may vary depending on your medical conditions, your age, and other factors. For most people, a normal blood pressure is less than 120/80.  Hypertension is treated with lifestyle changes, medicines, or a combination of both. Lifestyle changes include weight loss, eating a healthy, low-sodium diet, exercising more, and limiting alcohol. This information is not intended to replace advice given to you by your health care provider. Make sure you discuss any questions you have with your health care provider. Document Released: 05/01/2005 Document Revised: 03/29/2016 Document Reviewed: 03/29/2016 [employer] Interactive Patient Education  2018 [employer]  

## 2018-02-21 NOTE — Progress Notes (Signed)
Daniel [name] 77 y.o.   Chief Complaint  Patient presents with  . Medication Refill    amlodipine and lisinopril-hctz    HISTORY OF PRESENT ILLNESS: This is a 77 y.o. male here for transfer of care.  Patient of Dr. Tamala Julian.  Has a history of hypertension and needs medication refills.  Has been off medication for 2 weeks.  Asymptomatic with no complaints.  Also has a history of chronic kidney disease.   BP Readings from Last 3 Encounters:  02/21/18 (!) 193/80  12/04/16 104/67  09/19/16 137/67    HPI   Prior to Admission medications   Medication Sig Start Date End Date Taking? Authorizing Provider  amLODipine (NORVASC) 10 MG tablet TAKE 1 TABLET BY MOUTH  DAILY 11/19/17  Yes Wardell Honour, MD  aspirin 81 MG tablet Take 81 mg by mouth daily.   Yes [provider]  colchicine 0.6 MG tablet 1 tablet tid for 5 days with onset of gout symptoms 09/19/16  Yes Wardell Honour, MD  diclofenac sodium (VOLTAREN) 1 % GEL Apply 4 g topically 4 (four) times daily. 06/28/16  Yes Wardell Honour, MD  lisinopril-hydrochlorothiazide (PRINZIDE,ZESTORETIC) 20-25 MG tablet TAKE 1 TABLET BY MOUTH  DAILY 11/19/17  Yes Wardell Honour, MD  Vitamin E 400 units TABS Take by mouth.   Yes [provider]    No Known Allergies  Patient Active Problem List   Diagnosis Date Noted  . 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 Seasonal allergic rhinitis due to pollen 10/09/2016  . LVH (left ventricular hypertrophy) 10/09/2016  . Renal insufficiency 02/25/2016  . HTN (hypertension) 12/15/2013    Past Medical History:  Diagnosis Date  . Allergy   . Arthritis   . Chronic renal insufficiency   . Glaucoma   . Hypertension     Past Surgical History:  Procedure Laterality Date  . COLONOSCOPY    . EYE SURGERY     B cataract surgery. [name].    Social History   Socioeconomic History  . Marital  status: Married    Spouse name: Not on file  . Number of children: Not on file  . Years of education: Not on file  . Highest education level: Not on file  Occupational History  . Not on file  Social Needs  . Financial resource strain: Not on file  . Food insecurity:    Worry: Not on file    Inability: Not on file  . Transportation needs:    Medical: Not on file    Non-medical: Not on file  Tobacco Use  . Smoking status: Never Smoker  . Smokeless tobacco: Never Used  Substance and Sexual Activity  . Alcohol use: Yes    Comment: ocas  . Drug use: No  . Sexual activity: Not on file  Lifestyle  . Physical activity:    Days per week: Not on file    Minutes per session: Not on file  . Stress: Not on file  Relationships  . Social connections:    Talks on phone: Not on file    Gets together: Not on file    Attends religious service: Not on file    Active member of club or organization: Not on file    Attends meetings of clubs or organizations: Not on file    Relationship status: Not on file  . Intimate partner  violence:    Fear of current or ex partner: Not on file    Emotionally abused: Not on file    Physically abused: Not on file    Forced sexual activity: Not on file  Other Topics Concern  . Not on file  Social History Narrative   Marital status: widowed since 2010; married x 37 years. Not dating in 2018.      Children: 3 daughters; no sons; 7 grandchildren; 2 gg.      Lives: alone in house in neighborhood.     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       Tobacco:  Quit smoking 30 years ago.      Alcohol: weekends. Scotch.      Exercise: sporadic; cuts grass and trims hedges. Stays busy in yard.        Seatbelt:  100%      Guns: none      ADLs: drives; independent ADLs; buys groceries; pays bills.  Cut grass.  NO cane or assistant devices.      Advanced Directives: yes; No CPR; DNI/DNR.               Family History  Problem Relation  Age of Onset  . Diabetes Mother   . Colon cancer Neg Hx   . Esophageal cancer Neg Hx   . Stomach cancer Neg Hx   . Pancreatic cancer Neg Hx      Review of Systems  Constitutional: Negative.  Negative for chills and fever.  HENT: Negative.   Eyes: Negative.  Negative for blurred vision and double vision.  Respiratory: Negative.  Negative for cough and shortness of breath.   Cardiovascular: Negative.  Negative for chest pain and palpitations.  Gastrointestinal: Negative for abdominal pain, diarrhea, nausea and vomiting.  Genitourinary: Negative.   Musculoskeletal: Negative.  Negative for back pain, myalgias and neck pain.  Skin: Negative.  Negative for rash.  Neurological: Negative.  Negative for dizziness and headaches.  Endo/Heme/Allergies: Negative.   All other systems reviewed and are negative.   Vitals:   02/21/18 1047  BP: (!) 193/80  Pulse: 90  Resp: 16  Temp: 98.9 F (37.2 C)  SpO2: 96%    Physical Exam  Constitutional: He is oriented to person, place, and time. He appears well-developed and well-nourished.  HENT:  Head: Normocephalic and atraumatic.  Mouth/Throat: Oropharynx is clear and moist.  Eyes: Pupils are equal, round, and reactive to light. Conjunctivae and EOM are normal.  Neck: Normal range of motion. Neck supple.  Cardiovascular: Normal rate, regular rhythm and normal heart sounds.  Pulmonary/Chest: Effort normal and breath sounds normal.  Musculoskeletal: Normal range of motion.  Neurological: He is alert and oriented to person, place, and time. No sensory deficit. He exhibits normal muscle tone. Coordination normal.  Skin: Skin is warm and dry. Capillary refill takes less than 2 seconds.  Psychiatric: He has a normal mood and affect. His behavior is normal.  Vitals reviewed.   A total of 25 minutes was spent in the room with the patient, greater than 50% of which was in counseling/coordination of care regarding chronic medical problems,  medications, nutrition, and need for follow-up.   ASSESSMENT & PLAN: Quay was seen today for medication refill.  Diagnoses and all orders for this visit:  Essential hypertension -     COMPLETE METABOLIC PANEL WITH GFR -     Discontinue: lisinopril-hydrochlorothiazide (PRINZIDE,ZESTORETIC) 20-25 MG tablet; Take 1 tablet by mouth daily. -  Discontinue: amLODipine (NORVASC) 10 MG tablet; Take 1 tablet (10 mg total) by mouth daily. -     lisinopril-hydrochlorothiazide (PRINZIDE,ZESTORETIC) 20-25 MG tablet; Take 1 tablet by mouth daily. -     amLODipine (NORVASC) 10 MG tablet; Take 1 tablet (10 mg total) by mouth daily.  LVH (left ventricular hypertrophy) -     Discontinue: lisinopril-hydrochlorothiazide (PRINZIDE,ZESTORETIC) 20-25 MG tablet; Take 1 tablet by mouth daily. -     Discontinue: amLODipine (NORVASC) 10 MG tablet; Take 1 tablet (10 mg total) by mouth daily. -     lisinopril-hydrochlorothiazide (PRINZIDE,ZESTORETIC) 20-25 MG tablet; Take 1 tablet by mouth daily. -     amLODipine (NORVASC) 10 MG tablet; Take 1 tablet (10 mg total) by mouth daily.  Renal insufficiency -     COMPLETE METABOLIC PANEL WITH GFR    Patient Instructions       If you have lab work done today you will be contacted with your lab results within the next 2 weeks.  If you have not heard from [place] then please contact us. The fastest way to get your results is to register for My Chart.   IF you received an x-ray today, you will receive an invoice from [facility]. Please contact [facility] at [phone] with questions or concerns regarding your invoice.   IF you received labwork today, you will receive an invoice from [place]. Please contact [facility] at [phone] with questions or concerns regarding your invoice.   Our billing staff will not be able to assist you with questions regarding bills from these companies.  You will be contacted with the lab results as soon as they  are available. The fastest way to get your results is to activate your My Chart account. Instructions are located on the last page of this paperwork. If you have not heard from [place] regarding the results in 2 weeks, please contact this office.     Hypertension Hypertension, commonly called high blood pressure, is when the force of blood pumping through the arteries is too strong. The arteries are the blood vessels that carry blood from the heart throughout the body. Hypertension forces the heart to work harder to pump blood and may cause arteries to become narrow or stiff. Having untreated or uncontrolled hypertension can cause heart attacks, strokes, kidney disease, and other problems. A blood pressure reading consists of a higher number over a lower number. Ideally, your blood pressure should be below 120/80. The first ("top") number is called the systolic pressure. It is a measure of the pressure in your arteries as your heart beats. The second ("bottom") number is called the diastolic pressure. It is a measure of the pressure in your arteries as the heart relaxes. What are the causes? The cause of this condition is not known. What increases the risk? Some risk factors for high blood pressure are under your control. Others are not. Factors you can change  Smoking.  Having type 2 diabetes mellitus, high cholesterol, or both.  Not getting enough exercise or physical activity.  Being overweight.  Having too much fat, sugar, calories, or salt (sodium) in your diet.  Drinking too much alcohol. Factors that are difficult or impossible to change  Having chronic kidney disease.  Having a family history of high blood pressure.  Age. Risk increases with age.  Race. You may be at higher risk if you are African-American.  Gender. Men are at higher risk than women before age 45. After age 19, women are at  higher risk than men.  Having obstructive sleep apnea.  Stress. What are the signs or  symptoms? Extremely high blood pressure (hypertensive crisis) may cause:  Headache.  Anxiety.  Shortness of breath.  Nosebleed.  Nausea and vomiting.  Severe chest pain.  Jerky movements you cannot control (seizures).  How is this diagnosed? This condition is diagnosed by measuring your blood pressure while you are seated, with your arm resting on a surface. The cuff of the blood pressure monitor will be placed directly against the skin of your upper arm at the level of your heart. It should be measured at least twice using the same arm. Certain conditions can cause a difference in blood pressure between your right and left arms. Certain factors can cause blood pressure readings to be lower or higher than normal (elevated) for a short period of time:  When your blood pressure is higher when you are in a health care provider's office than when you are at home, this is called white coat hypertension. Most people with this condition do not need medicines.  When your blood pressure is higher at home than when you are in a health care provider's office, this is called masked hypertension. Most people with this condition may need medicines to control blood pressure.  If you have a high blood pressure reading during one visit or you have normal blood pressure with other risk factors:  You may be asked to return on a different day to have your blood pressure checked again.  You may be asked to monitor your blood pressure at home for 1 week or longer.  If you are diagnosed with hypertension, you may have other blood or imaging tests to help your health care provider understand your overall risk for other conditions. How is this treated? This condition is treated by making healthy lifestyle changes, such as eating healthy foods, exercising more, and reducing your alcohol intake. Your health care provider may prescribe medicine if lifestyle changes are not enough to get your blood pressure  under control, and if:  Your systolic blood pressure is above 130.  Your diastolic blood pressure is above 80.  Your personal target blood pressure may vary depending on your medical conditions, your age, and other factors. Follow these instructions at home: Eating and drinking  Eat a diet that is high in fiber and potassium, and low in sodium, added sugar, and fat. An example eating plan is called the DASH (Dietary Approaches to Stop Hypertension) diet. To eat this way: ? Eat plenty of fresh fruits and vegetables. Try to fill half of your [name] at each meal with fruits and vegetables. ? Eat whole grains, such as whole wheat pasta, brown rice, or whole grain bread. Fill about one quarter of your [name] with whole grains. ? Eat or drink low-fat dairy products, such as skim milk or low-fat yogurt. ? Avoid fatty cuts of meat, processed or cured meats, and poultry with skin. Fill about one quarter of your [name] with lean proteins, such as fish, chicken without skin, beans, eggs, and tofu. ? Avoid premade and processed foods. These tend to be higher in sodium, added sugar, and fat.  Reduce your daily sodium intake. Most people with hypertension should eat less than 1,500 mg of sodium a day.  Limit alcohol intake to no more than 1 drink a day for nonpregnant women and 2 drinks a day for men. One drink equals 12 oz of beer, 5 oz of wine, or 1 oz  of hard liquor. Lifestyle  Work with your health care provider to maintain a healthy body weight or to lose weight. Ask what an ideal weight is for you.  Get at least 30 minutes of exercise that causes your heart to beat faster (aerobic exercise) most days of the week. Activities may include walking, swimming, or biking.  Include exercise to strengthen your muscles (resistance exercise), such as pilates or lifting weights, as part of your weekly exercise routine. Try to do these types of exercises for 30 minutes at least 3 days a week.  Do not use any  products that contain nicotine or tobacco, such as cigarettes and e-cigarettes. If you need help quitting, ask your health care provider.  Monitor your blood pressure at home as told by your health care provider.  Keep all follow-up visits as told by your health care provider. This is important. Medicines  Take over-the-counter and prescription medicines only as told by your health care provider. Follow directions carefully. Blood pressure medicines must be taken as prescribed.  Do not skip doses of blood pressure medicine. Doing this puts you at risk for problems and can make the medicine less effective.  Ask your health care provider about side effects or reactions to medicines that you should watch for. Contact a health care provider if:  You think you are having a reaction to a medicine you are taking.  You have headaches that keep coming back (recurring).  You feel dizzy.  You have swelling in your ankles.  You have trouble with your vision. Get help right away if:  You develop a severe headache or confusion.  You have unusual weakness or numbness.  You feel faint.  You have severe pain in your chest or abdomen.  You vomit repeatedly.  You have trouble breathing. Summary  Hypertension is when the force of blood pumping through your arteries is too strong. If this condition is not controlled, it may put you at risk for serious complications.  Your personal target blood pressure may vary depending on your medical conditions, your age, and other factors. For most people, a normal blood pressure is less than 120/80.  Hypertension is treated with lifestyle changes, medicines, or a combination of both. Lifestyle changes include weight loss, eating a healthy, low-sodium diet, exercising more, and limiting alcohol. This information is not intended to replace advice given to you by your health care provider. Make sure you discuss any questions you have with your health care  provider. Document Released: 05/01/2005 Document Revised: 03/29/2016 Document Reviewed: 03/29/2016 Elsevier Interactive Patient Education  2018 Elsevier Inc.      Agustina Caroli, MD [facility]

## 2018-02-21 NOTE — Addendum Note (Signed)
Addended by: Alfredia Ferguson A on: 02/21/2018 12:02 PM   Modules accepted: Orders

## 2018-02-21 NOTE — Addendum Note (Signed)
Addended by: Davina Poke on: 02/21/2018 11:37 AM   Modules accepted: Orders

## 2018-02-22 ENCOUNTER — Encounter: Payer: Self-pay | Admitting: *Deleted

## 2018-06-04 ENCOUNTER — Telehealth: Payer: Self-pay | Admitting: *Deleted

## 2018-06-04 NOTE — Telephone Encounter (Signed)
Left message to schedule AWV 

## 2018-06-11 NOTE — Telephone Encounter (Signed)
Patient daughter Daniel [name] called back to schedule AWV. Please advise Ph# [phone]

## 2018-07-04 ENCOUNTER — Telehealth: Payer: Self-pay | Admitting: Emergency Medicine

## 2018-07-04 NOTE — Telephone Encounter (Signed)
LVM for pt regarding their appt on 4/10 with Dr. Mitchel Honour. Due to Sagardia being out of the office the appt had to be cancelled. I advised pt of his other appt for AWV and advised that he could reschedule the 4/10 appt when he came in for that appt or he could call and reschedule. If pt calls, please reschedule his 4/10 appt with Dr. Mitchel Honour at his convenience. Thank you!

## 2018-07-29 ENCOUNTER — Other Ambulatory Visit: Payer: Self-pay

## 2018-07-29 ENCOUNTER — Ambulatory Visit (INDEPENDENT_AMBULATORY_CARE_PROVIDER_SITE_OTHER): Payer: Medicare Other | Admitting: Family Medicine

## 2018-07-29 VITALS — BP 167/77 | HR 77 | Ht 69.0 in | Wt 240.0 lb

## 2018-07-29 DIAGNOSIS — Z0000 Encounter for general adult medical examination without abnormal findings: Secondary | ICD-10-CM | POA: Diagnosis not present

## 2018-07-29 DIAGNOSIS — Z0100 Encounter for examination of eyes and vision without abnormal findings: Secondary | ICD-10-CM | POA: Diagnosis not present

## 2018-07-29 NOTE — Patient Instructions (Addendum)
Thank you for taking time to come for your Medicare Wellness Visit. I appreciate your ongoing commitment to your health goals. Please review the following plan we discussed and let me know if I can assist you in the future.    LPN  Advance Directive  Advance directives are legal documents that let you make choices ahead of time about your health care and medical treatment in case you become unable to communicate for yourself. Advance directives are a way for you to communicate your wishes to family, friends, and health care providers. This can help convey your decisions about end-of-life care if you become unable to communicate. Discussing and writing advance directives should happen over time rather than all at once. Advance directives can be changed depending on your situation and what you want, even after you have signed the advance directives. If you do not have an advance directive, some states assign family decision makers to act on your behalf based on how closely you are related to them. Each state has its own laws regarding advance directives. You may want to check with your health care provider, attorney, or state representative about the laws in your state. There are different types of advance directives, such as:  Medical power of attorney.  Living will.  Do not resuscitate (DNR) or do not attempt resuscitation (DNAR) order. Health care proxy and medical power of attorney A health care proxy, also called a health care agent, is a person who is appointed to make medical decisions for you in cases in which you are unable to make the decisions yourself. Generally, people choose someone they know well and trust to represent their preferences. Make sure to ask this person for an agreement to act as your proxy. A proxy may have to exercise judgment in the event of a medical decision for which your wishes are not known. A medical power of attorney is a legal document that names your  health care proxy. Depending on the laws in your state, after the document is written, it may also need to be:  Signed.  Notarized.  Dated.  Copied.  Witnessed.  Incorporated into your medical record. You may also want to appoint someone to manage your financial affairs in a situation in which you are unable to do so. This is called a durable power of attorney for finances. It is a separate legal document from the durable power of attorney for health care. You may choose the same person or someone different from your health care proxy to act as your agent in financial matters. If you do not appoint a proxy, or if there is a concern that the proxy is not acting in your best interests, a court-appointed guardian may be designated to act on your behalf. Living will A living will is a set of instructions documenting your wishes about medical care when you cannot express them yourself. Health care providers should keep a copy of your living will in your medical record. You may want to give a copy to family members or friends. To alert caregivers in case of an emergency, you can place a card in your wallet to let them know that you have a living will and where they can find it. A living will is used if you become:  Terminally ill.  Incapacitated.  Unable to communicate or make decisions. Items to consider in your living will include:  The use or non-use of life-sustaining equipment, such as dialysis machines and breathing machines (ventilators).    A DNR or DNAR order, which is the instruction not to use cardiopulmonary resuscitation (CPR) if breathing or heartbeat stops.  The use or non-use of tube feeding.  Withholding of food and fluids.  Comfort (palliative) care when the goal becomes comfort rather than a cure.  Organ and tissue donation. A living will does not give instructions for distributing your money and property if you should pass away. It is recommended that you seek the  advice of a lawyer when writing a will. Decisions about taxes, beneficiaries, and asset distribution will be legally binding. This process can relieve your family and friends of any concerns surrounding disputes or questions that may come up about the distribution of your assets. DNR or DNAR A DNR or DNAR order is a request not to have CPR in the event that your heart stops beating or you stop breathing. If a DNR or DNAR order has not been made and shared, a health care provider will try to help any patient whose heart has stopped or who has stopped breathing. If you plan to have surgery, talk with your health care provider about how your DNR or DNAR order will be followed if problems occur. Summary  Advance directives are the legal documents that allow you to make choices ahead of time about your health care and medical treatment in case you become unable to communicate for yourself.  The process of discussing and writing advance directives should happen over time. You can change the advance directives, even after you have signed them.  Advance directives include DNR or DNAR orders, living wills, and designating an agent as your medical power of attorney. This information is not intended to replace advice given to you by your health care provider. Make sure you discuss any questions you have with your health care provider. Document Released: 08/08/2007 Document Revised: 03/20/2016 Document Reviewed: 03/20/2016 Elsevier Interactive Patient Education  2019 Elsevier Inc.    Healthy Eating Following a healthy eating pattern may help you to achieve and maintain a healthy body weight, reduce the risk of chronic disease, and live a long and productive life. It is important to follow a healthy eating pattern at an appropriate calorie level for your body. Your nutritional needs should be met primarily through food by choosing a variety of nutrient-rich foods. What are tips for following this plan?  Reading food labels  Read labels and choose the following: ? Reduced or low sodium. ? Juices with 100% fruit juice. ? Foods with low saturated fats and high polyunsaturated and monounsaturated fats. ? Foods with whole grains, such as whole wheat, cracked wheat, brown rice, and wild rice. ? Whole grains that are fortified with folic acid. This is recommended for women who are pregnant or who want to become pregnant.  Read labels and avoid the following: ? Foods with a lot of added sugars. These include foods that contain brown sugar, corn sweetener, corn syrup, dextrose, fructose, glucose, high-fructose corn syrup, honey, invert sugar, lactose, malt syrup, maltose, molasses, raw sugar, sucrose, trehalose, or turbinado sugar.  Do not eat more than the following amounts of added sugar per day:  6 teaspoons (25 g) for women.  9 teaspoons (38 g) for men. ? Foods that contain processed or refined starches and grains. ? Refined grain products, such as white flour, degermed cornmeal, white bread, and white rice. Shopping  Choose nutrient-rich snacks, such as vegetables, whole fruits, and nuts. Avoid high-calorie and high-sugar snacks, such as potato chips, fruit snacks, and   candy.  Use oil-based dressings and spreads on foods instead of solid fats such as butter, stick margarine, or cream cheese.  Limit pre-made sauces, mixes, and "instant" products such as flavored rice, instant noodles, and ready-made pasta.  Try more plant-protein sources, such as tofu, tempeh, black beans, edamame, lentils, nuts, and seeds.  Explore eating plans such as the Mediterranean diet or vegetarian diet. Cooking  Use oil to saut or stir-fry foods instead of solid fats such as butter, stick margarine, or lard.  Try baking, boiling, grilling, or broiling instead of frying.  Remove the fatty part of meats before cooking.  Steam vegetables in water or broth. Meal planning   At meals, imagine dividing your  plate into fourths: ? One-half of your plate is fruits and vegetables. ? One-fourth of your plate is whole grains. ? One-fourth of your plate is protein, especially lean meats, poultry, eggs, tofu, beans, or nuts.  Include low-fat dairy as part of your daily diet. Lifestyle  Choose healthy options in all settings, including home, work, school, restaurants, or stores.  Prepare your food safely: ? Wash your hands after handling raw meats. ? Keep food preparation surfaces clean by regularly washing with hot, soapy water. ? Keep raw meats separate from ready-to-eat foods, such as fruits and vegetables. ? Cook seafood, meat, poultry, and eggs to the recommended internal temperature. ? Store foods at safe temperatures. In general:  Keep cold foods at 40F (4.4C) or below.  Keep hot foods at 140F (60C) or above.  Keep your freezer at 0F (-17.8C) or below.  Foods are no longer safe to eat when they have been between the temperatures of 40-140F (4.4-60C) for more than 2 hours. What foods should I eat? Fruits Aim to eat 2 cup-equivalents of fresh, canned (in natural juice), or frozen fruits each day. Examples of 1 cup-equivalent of fruit include 1 small apple, 8 large strawberries, 1 cup canned fruit,  cup dried fruit, or 1 cup 100% juice. Vegetables Aim to eat 2-3 cup-equivalents of fresh and frozen vegetables each day, including different varieties and colors. Examples of 1 cup-equivalent of vegetables include 2 medium carrots, 2 cups raw, leafy greens, 1 cup chopped vegetable (raw or cooked), or 1 medium baked potato. Grains Aim to eat 6 ounce-equivalents of whole grains each day. Examples of 1 ounce-equivalent of grains include 1 slice of bread, 1 cup ready-to-eat cereal, 3 cups popcorn, or  cup cooked rice, pasta, or cereal. Meats and other proteins Aim to eat 5-6 ounce-equivalents of protein each day. Examples of 1 ounce-equivalent of protein include 1 egg, 1/2 cup nuts or  seeds, or 1 tablespoon (16 g) peanut butter. A cut of meat or fish that is the size of a deck of cards is about 3-4 ounce-equivalents.  Of the protein you eat each week, try to have at least 8 ounces come from seafood. This includes salmon, trout, herring, and anchovies. Dairy Aim to eat 3 cup-equivalents of fat-free or low-fat dairy each day. Examples of 1 cup-equivalent of dairy include 1 cup (240 mL) milk, 8 ounces (250 g) yogurt, 1 ounces (44 g) natural cheese, or 1 cup (240 mL) fortified soy milk. Fats and oils  Aim for about 5 teaspoons (21 g) per day. Choose monounsaturated fats, such as canola and olive oils, avocados, peanut butter, and most nuts, or polyunsaturated fats, such as sunflower, corn, and soybean oils, walnuts, pine nuts, sesame seeds, sunflower seeds, and flaxseed. Beverages  Aim for six 8-oz glasses of   water per day. Limit coffee to three to five 8-oz cups per day.  Limit caffeinated beverages that have added calories, such as soda and energy drinks.  Limit alcohol intake to no more than 1 drink a day for nonpregnant women and 2 drinks a day for men. One drink equals 12 oz of beer (355 mL), 5 oz of wine (148 mL), or 1 oz of hard liquor (44 mL). Seasoning and other foods  Avoid adding excess amounts of salt to your foods. Try flavoring foods with herbs and spices instead of salt.  Avoid adding sugar to foods.  Try using oil-based dressings, sauces, and spreads instead of solid fats. This information is based on general [place] nutrition guidelines. For more information, visit choosemyplate.gov. Exact amounts may vary based on your nutrition needs. Summary  A healthy eating plan may help you to maintain a healthy weight, reduce the risk of chronic diseases, and stay active throughout your life.  Plan your meals. Make sure you eat the right portions of a variety of nutrient-rich foods.  Try baking, boiling, grilling, or broiling instead of frying.  Choose healthy  options in all settings, including home, work, school, restaurants, or stores. This information is not intended to replace advice given to you by your health care provider. Make sure you discuss any questions you have with your health care provider. Document Released: 08/13/2017 Document Revised: 08/13/2017 Document Reviewed: 08/13/2017 Elsevier Interactive Patient Education  2019 Elsevier Inc.  

## 2018-07-29 NOTE — Progress Notes (Addendum)
Presents today for [facility] Wellness Visit  Patient Care Team: Horald Pollen, MD as PCP - General (Internal Medicine)    Immunization status:  Immunization History  Administered Date(s) Administered  . Influenza, High Dose Seasonal PF 02/21/2018  . Influenza,inj,Quad PF,6+ Mos 01/28/2014, 06/28/2016  . Pneumococcal Conjugate-13 01/28/2014  . Pneumococcal Polysaccharide-23 12/04/2014     There are no preventive care reminders to display for this patient.   Functional Status Survey: Is the patient deaf or have difficulty hearing?: No Does the patient have difficulty seeing, even when wearing glasses/contacts?: No Does the patient have difficulty concentrating, remembering, or making decisions?: No Does the patient have difficulty walking or climbing stairs?: Yes Does the patient have difficulty dressing or bathing?: No Does the patient have difficulty doing errands alone such as visiting a doctor's office or shopping?: No   6CIT Screen 07/29/2018  What Year? 0 points  What month? 0 points  What time? 0 points  Count back from 20 0 points  Months in reverse 0 points  Repeat phrase 0 points  Total Score 0        Clinical Support from 07/29/2018 in [facility] at [facility]  AUDIT-C Score  0        Patient Active Problem List   Diagnosis Date Noted  . 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 Seasonal allergic rhinitis due to pollen 10/09/2016  . LVH (left ventricular hypertrophy) 10/09/2016  . Renal insufficiency 02/25/2016  . HTN (hypertension) 12/15/2013     Past Medical History:  Diagnosis Date  . Allergy   . Arthritis   . Chronic renal insufficiency   . Glaucoma   . Hypertension      Past Surgical History:  Procedure Laterality Date  . COLONOSCOPY    . EYE SURGERY     B cataract surgery. [name].     Family History  Problem Relation Age of  Onset  . Diabetes Mother   . Colon cancer Neg Hx   . Esophageal cancer Neg Hx   . Stomach cancer Neg Hx   . Pancreatic cancer Neg Hx      Social History   Socioeconomic History  . Marital status: Married    Spouse name: Not on file  . Number of children: Not on file  . Years of education: Not on file  . Highest education level: Not on file  Occupational History  . Not on file  Social Needs  . Financial resource strain: Not on file  . Food insecurity:    Worry: Not on file    Inability: Not on file  . Transportation needs:    Medical: Not on file    Non-medical: Not on file  Tobacco Use  . Smoking status: Never Smoker  . Smokeless tobacco: Never Used  Substance and Sexual Activity  . Alcohol use: Yes    Comment: ocas  . Drug use: No  . Sexual activity: Not on file  Lifestyle  . Physical activity:    Days per week: Not on file    Minutes per session: Not on file  . Stress: Not on file  Relationships  . Social connections:    Talks on phone: Not on file    Gets together: Not on file    Attends religious service: Not on file    Active member of club or organization: Not on  file    Attends meetings of clubs or organizations: Not on file    Relationship status: Not on file  . Intimate partner violence:    Fear of current or ex partner: Not on file    Emotionally abused: Not on file    Physically abused: Not on file    Forced sexual activity: Not on file  Other Topics Concern  . Not on file  Social History Narrative   Marital status: widowed since 2010; married x 37 years. Not dating in 2018.      Children: 3 daughters; no sons; 7 grandchildren; 2 gg.      Lives: alone in house in neighborhood.     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       Tobacco:  Quit smoking 30 years ago.      Alcohol: weekends. Scotch.      Exercise: sporadic; cuts grass and trims hedges. Stays busy in yard.        Seatbelt:  100%      Guns: none       ADLs: drives; independent ADLs; buys groceries; pays bills.  Cut grass.  NO cane or assistant devices.      Advanced Directives: yes; No CPR; DNI/DNR.               No Known Allergies   Prior to Admission medications   Medication Sig Start Date End Date Taking? Authorizing Provider  aspirin 81 MG tablet Take 81 mg by mouth daily.   Yes [provider]  colchicine 0.6 MG tablet 1 tablet tid for 5 days with onset of gout symptoms 09/19/16  Yes Wardell Honour, MD  diclofenac sodium (VOLTAREN) 1 % GEL Apply 4 g topically 4 (four) times daily. 06/28/16  Yes Wardell Honour, MD  Vitamin E 400 units TABS Take by mouth.   Yes [provider]  amLODipine (NORVASC) 10 MG tablet Take 1 tablet (10 mg total) by mouth daily. 02/21/18 03/23/18  Horald Pollen, MD  lisinopril-hydrochlorothiazide (PRINZIDE,ZESTORETIC) 20-25 MG tablet Take 1 tablet by mouth daily. 02/21/18 03/23/18  Horald Pollen, MD     Depression screen [facility] 2/9 07/29/2018 02/21/2018 09/19/2016 06/28/2016 02/25/2016  Decreased Interest 0 0 0 0 0  Down, Depressed, Hopeless 0 0 0 0 0  PHQ - 2 Score 0 0 0 0 0     Fall Risk  07/29/2018 02/21/2018 09/19/2016 06/28/2016 02/25/2016  Falls in the past year? 1 Yes No No No  Number falls in past yr: 0 1 - - -  Injury with Fall? 0 Yes - - -  Comment - aggravated right ankle causing swelling - - -  Follow up Falls evaluation completed;Education provided - - - -      PHYSICAL EXAM: BP (!) 167/77   Pulse 77   Wt 240 lb (108.9 kg)   BMI 34.44 kg/m    Wt Readings from Last 3 Encounters:  07/29/18 240 lb (108.9 kg)  02/21/18 240 lb 3.2 oz (109 kg)  12/04/16 230 lb (104.3 kg)     No exam data present    Physical Exam   Education/Counseling provided regarding diet and exercise, prevention of chronic diseases, smoking/tobacco cessation, if applicable, and reviewed "Covered Medicare Preventive Services."   ASSESSMENT/PLAN: 1. Eye exam, routine -  Ambulatory referral to Ophthalmology  2. Medicare welcome exam   One story home lives alone most of time sometime grandson comes and goes. Does a lot  of yard work Financial planner /baking  No grab bars No scattered rugs Free of cluttered / well lit  Daughter is currently working with [employer] to get his bathroom redone.  Has to hold to railings when going up and down stairs he uses his back door where it is more level to access car.   Eye doctor Dr. Katy Fitch has a cataract surgery will put a referral in for eye check hasn't been since 2018.  No problems so far.  Advanced Directives discussed information given.  Will start retaking Blood Pressure at home and bring reading with him at his next visit .  BP readings high and low explained to patient    I have reviewed and agree with the above AWV documentation. Agustina Caroli, MD

## 2018-08-23 ENCOUNTER — Ambulatory Visit: Payer: Medicare Other | Admitting: Emergency Medicine

## 2018-09-03 ENCOUNTER — Other Ambulatory Visit: Payer: Self-pay

## 2018-09-03 ENCOUNTER — Ambulatory Visit (INDEPENDENT_AMBULATORY_CARE_PROVIDER_SITE_OTHER): Payer: Medicare Other | Admitting: Family Medicine

## 2018-09-03 VITALS — BP 158/75 | HR 65 | Temp 98.0°F

## 2018-09-03 DIAGNOSIS — I10 Essential (primary) hypertension: Secondary | ICD-10-CM

## 2018-09-04 ENCOUNTER — Other Ambulatory Visit: Payer: Self-pay

## 2018-09-04 ENCOUNTER — Encounter: Payer: Self-pay | Admitting: Emergency Medicine

## 2018-09-04 ENCOUNTER — Telehealth (INDEPENDENT_AMBULATORY_CARE_PROVIDER_SITE_OTHER): Payer: Medicare Other | Admitting: Emergency Medicine

## 2018-09-04 DIAGNOSIS — N289 Disorder of kidney and ureter, unspecified: Secondary | ICD-10-CM

## 2018-09-04 DIAGNOSIS — I10 Essential (primary) hypertension: Secondary | ICD-10-CM | POA: Diagnosis not present

## 2018-09-04 DIAGNOSIS — Z6834 Body mass index (BMI) 34.0-34.9, adult: Secondary | ICD-10-CM

## 2018-09-04 DIAGNOSIS — E6609 Other obesity due to excess calories: Secondary | ICD-10-CM

## 2018-09-04 DIAGNOSIS — I517 Cardiomegaly: Secondary | ICD-10-CM

## 2018-09-04 NOTE — Progress Notes (Signed)
BP Readings from Last 3 Encounters:  09/03/18 (!) 158/75  07/29/18 (!) 167/77  02/21/18 (!) 193/80   Virtual Visit via Video Note  I connected with Pilar Plate on 09/04/18 at 10:20 AM EDT by a video enabled telemedicine application and verified that I am speaking with the correct person using two identifiers.   I discussed the limitations of evaluation and management by telemedicine and the availability of in person appointments. The patient expressed understanding and agreed to proceed.  History of Present Illness: 78 year old male with history of hypertension, on lisinopril-hydrochlorothiazide and amlodipine daily, under control. Today's visit for follow-up.  Not requesting medication refill. Has no complaints or medical concerns today. Patient Active Problem List   Diagnosis Date Noted  . 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 Seasonal allergic rhinitis due to pollen 10/09/2016  . LVH (left ventricular hypertrophy) 10/09/2016  . Renal insufficiency 02/25/2016  . HTN (hypertension) 12/15/2013      Observations/Objective: BP Readings from Last 3 Encounters:  09/03/18 (!) 158/75  07/29/18 (!) 167/77  02/21/18 (!) 193/80   Awake and oriented x3 in no apparent respiratory distress.  Assessment and Plan: Clinically stable.  No medical concerns identified during this visit. Continue present medications.  No changes made. Health maintenance reviewed. Diagnoses and all orders for this visit:  Essential hypertension  LVH (left ventricular hypertrophy)  Renal insufficiency  Class 1 obesity due to excess calories with serious comorbidity and body mass index (BMI) of 34.0 to 34.9 in adult    Follow Up Instructions: Office visit in 3 to 6 months.   I discussed the assessment and treatment plan with the patient. The patient was provided an opportunity to ask questions and all were  answered. The patient agreed with the plan and demonstrated an understanding of the instructions.   The patient was advised to call back or seek an in-person evaluation if the symptoms worsen or if the condition fails to improve as anticipated.  I provided 15 minutes of non-face-to-face time during this encounter.   Horald Pollen, [place]

## 2018-09-04 NOTE — Progress Notes (Signed)
Spoke to patient, triaged for follow up on blood pressure. Patient states blood pressure yesterday was 160/74 and at the doctor's the nurse took it and it was 158/75. Patient does not need refills on medications. No international travel or been around any one positive with COVID-19.

## 2018-12-04 ENCOUNTER — Telehealth: Payer: Self-pay | Admitting: Emergency Medicine

## 2018-12-04 NOTE — Telephone Encounter (Signed)
Copied from [place] [phone]. Topic: General - Other >> Dec 04, 2018  2:33 PM Keene Breath wrote: Reason for CRM: Patient's daughter would like a call from the nurse regarding a form that they misplaced for patient.  Patient believes it is a health wheel form that authorizes someone to make decisions when the patient cannot.  CB# [phone]

## 2018-12-11 NOTE — Telephone Encounter (Signed)
I have called and spoke to pt daughter. Pt is requesting a blank living will documentation. I have placed it in the outgoing mail.   Also pt will call to make his next appointment to follow up. Pt daughter reports that pt is doing well.

## 2019-01-14 ENCOUNTER — Other Ambulatory Visit: Payer: Self-pay

## 2019-01-14 ENCOUNTER — Ambulatory Visit (INDEPENDENT_AMBULATORY_CARE_PROVIDER_SITE_OTHER): Payer: Medicare Other | Admitting: Emergency Medicine

## 2019-01-14 ENCOUNTER — Encounter: Payer: Self-pay | Admitting: Emergency Medicine

## 2019-01-14 VITALS — BP 148/76 | HR 68 | Temp 99.3°F | Resp 16 | Ht 69.0 in | Wt 249.8 lb

## 2019-01-14 DIAGNOSIS — Z23 Encounter for immunization: Secondary | ICD-10-CM | POA: Diagnosis not present

## 2019-01-14 DIAGNOSIS — L03115 Cellulitis of right lower limb: Secondary | ICD-10-CM

## 2019-01-14 DIAGNOSIS — I872 Venous insufficiency (chronic) (peripheral): Secondary | ICD-10-CM | POA: Diagnosis not present

## 2019-01-14 DIAGNOSIS — N289 Disorder of kidney and ureter, unspecified: Secondary | ICD-10-CM

## 2019-01-14 DIAGNOSIS — I10 Essential (primary) hypertension: Secondary | ICD-10-CM | POA: Diagnosis not present

## 2019-01-14 MED ORDER — CEFADROXIL 500 MG PO CAPS: 500.0000 mg | ORAL_CAPSULE | Freq: Two times a day (BID) | ORAL | 0 refills | Status: AC

## 2019-01-14 NOTE — Patient Instructions (Addendum)
If you have lab work done today you will be contacted with your lab results within the next 2 weeks.  If you have not heard from [place] then please contact us. The fastest way to get your results is to register for My Chart.   IF you received an x-ray today, you will receive an invoice from [facility]. Please contact [facility] at [phone] with questions or concerns regarding your invoice.   IF you received labwork today, you will receive an invoice from [place]. Please contact [facility] at [phone] with questions or concerns regarding your invoice.   Our billing staff will not be able to assist you with questions regarding bills from these companies.  You will be contacted with the lab results as soon as they are available. The fastest way to get your results is to activate your My Chart account. Instructions are located on the last page of this paperwork. If you have not heard from [place] regarding the results in 2 weeks, please contact this office.     Chronic Venous Insufficiency Chronic venous insufficiency is a condition where the leg veins cannot effectively pump blood from the legs to the heart. This happens when the vein walls are either stretched, weakened, or damaged, or when the valves inside the vein are damaged. With the right treatment, you should be able to continue with an active life. This condition is also called venous stasis. What are the causes? Common causes of this condition include:  High blood pressure inside the veins (venous hypertension).  Sitting or standing too long, causing increased blood pressure in the leg veins.  A blood clot that blocks blood flow in a vein (deep vein thrombosis, DVT).  Inflammation of a vein (phlebitis) that causes a blood clot to form.  Tumors in the pelvis that cause blood to back up. What increases the risk? The following factors may make you more likely to develop this condition:  Having a family  history of this condition.  Obesity.  Pregnancy.  Living without enough regular physical activity or exercise (sedentary lifestyle).  Smoking.  Having a job that requires long periods of standing or sitting in one place.  Being a certain age. Women in their 18s and 26s and men in their 76s are more likely to develop this condition. What are the signs or symptoms? Symptoms of this condition include:  Veins that are enlarged, bulging, or twisted (varicose veins).  Skin breakdown or ulcers.  Reddened skin or dark discoloration of skin on the leg between the knee and ankle.  Brown, smooth, tight, and painful skin just above the ankle, usually on the inside of the leg (lipodermatosclerosis).  Swelling of the legs. How is this diagnosed? This condition may be diagnosed based on:  Your medical history.  A physical exam.  Tests, such as: ? A procedure that creates an image of a blood vessel and nearby organs and provides information about blood flow through the blood vessel (duplex ultrasound). ? A procedure that tests blood flow (plethysmography). ? A procedure that looks at the veins using X-ray and dye (venogram). How is this treated? The goals of treatment are to help you return to an active life and to minimize pain or disability. Treatment depends on the severity of your condition, and it may include:  Wearing compression stockings. These can help relieve symptoms and help prevent your condition from getting worse. However, they do not cure the condition.  Sclerotherapy. This procedure involves an  injection of a solution that shrinks damaged veins.  Surgery. This may involve: ? Removing a diseased vein (vein stripping). ? Cutting off blood flow through the vein (laser ablation surgery). ? Repairing or reconstructing a valve within the affected vein. Follow these instructions at home:      Wear compression stockings as told by your health care provider. These  stockings help to prevent blood clots and reduce swelling in your legs.  Take over-the-counter and prescription medicines only as told by your health care provider.  Stay active by exercising, walking, or doing different activities. Ask your health care provider what activities are safe for you and how much exercise you need.  Drink enough fluid to keep your urine pale yellow.  Do not use any products that contain nicotine or tobacco, such as cigarettes, e-cigarettes, and chewing tobacco. If you need help quitting, ask your health care provider.  Keep all follow-up visits as told by your health care provider. This is important. Contact a health care provider if you:  Have redness, swelling, or more pain in the affected area.  See a red streak or line that goes up or down from the affected area.  Have skin breakdown or skin loss in the affected area, even if the breakdown is small.  Get an injury in the affected area. Get help right away if:  You get an injury and an open wound in the affected area.  You have: ? Severe pain that does not get better with medicine. ? Sudden numbness or weakness in the foot or ankle below the affected area. ? Trouble moving your foot or ankle. ? A fever. ? Worse or persistent symptoms. ? Chest pain. ? Shortness of breath. Summary  Chronic venous insufficiency is a condition where the leg veins cannot effectively pump blood from the legs to the heart.  Chronic venous insufficiency occurs when the vein walls become stretched, weakened, or damaged, or when valves within the vein are damaged.  Treatment depends on how severe your condition is. It often involves wearing compression stockings and may involve having a procedure.  Make sure you stay active by exercising, walking, or doing different activities. Ask your health care provider what activities are safe for you and how much exercise you need. This information is not intended to replace advice  given to you by your health care provider. Make sure you discuss any questions you have with your health care provider. Document Released: 09/04/2006 Document Revised: 01/22/2018 Document Reviewed: 01/22/2018 Elsevier Patient Education  [place].  Cellulitis, Adult  Cellulitis is a skin infection. The infected area is often warm, red, swollen, and sore. It occurs most often in the arms and lower legs. It is very important to get treated for this condition. What are the causes? This condition is caused by bacteria. The bacteria enter through a break in the skin, such as a cut, burn, insect bite, open sore, or crack. What increases the risk? This condition is more likely to occur in people who:  Have a weak body defense system (immune system).  Have open cuts, burns, bites, or scrapes on the skin.  Are older than 77 years of age.  Have a blood sugar problem (diabetes).  Have a long-lasting (chronic) liver disease (cirrhosis) or kidney disease.  Are very overweight (obese).  Have a skin problem, such as: ? Itchy rash (eczema). ? Slow movement of blood in the veins (venous stasis). ? Fluid buildup below the skin (edema).  Have  been treated with high-energy rays (radiation).  Use IV drugs. What are the signs or symptoms? Symptoms of this condition include:  Skin that is: ? Red. ? Streaking. ? Spotting. ? Swollen. ? Sore or painful when you touch it. ? Warm.  A fever.  Chills.  Blisters. How is this diagnosed? This condition is diagnosed based on:  Medical history.  Physical exam.  Blood tests.  Imaging tests. How is this treated? Treatment for this condition may include:  Medicines to treat infections or allergies.  Home care, such as: ? Rest. ? Placing cold or warm cloths (compresses) on the skin.  Hospital care, if the condition is very bad. Follow these instructions at home: Medicines  Take over-the-counter and prescription medicines only  as told by your doctor.  If you were prescribed an antibiotic medicine, take it as told by your doctor. Do not stop taking it even if you start to feel better. General instructions   Drink enough fluid to keep your pee (urine) pale yellow.  Do not touch or rub the infected area.  Raise (elevate) the infected area above the level of your heart while you are sitting or lying down.  Place cold or warm cloths on the area as told by your doctor.  Keep all follow-up visits as told by your doctor. This is important. Contact a doctor if:  You have a fever.  You do not start to get better after 1-2 days of treatment.  Your bone or joint under the infected area starts to hurt after the skin has healed.  Your infection comes back. This can happen in the same area or another area.  You have a swollen bump in the area.  You have new symptoms.  You feel ill and have muscle aches and pains. Get help right away if:  Your symptoms get worse.  You feel very sleepy.  You throw up (vomit) or have watery poop (diarrhea) for a long time.  You see red streaks coming from the area.  Your red area gets larger.  Your red area turns dark in color. These symptoms may represent a serious problem that is an emergency. Do not wait to see if the symptoms will go away. Get medical help right away. Call your local emergency services (911 in the [place] Do not drive yourself to the hospital. Summary  Cellulitis is a skin infection. The area is often warm, red, swollen, and sore.  This condition is treated with medicines, rest, and cold and warm cloths.  Take all medicines only as told by your doctor.  Tell your doctor if symptoms do not start to get better after 1-2 days of treatment. This information is not intended to replace advice given to you by your health care provider. Make sure you discuss any questions you have with your health care provider. Document Released: 10/18/2007 Document  Revised: 09/20/2017 Document Reviewed: 09/20/2017 Elsevier Patient Education  2020 [employer].

## 2019-01-14 NOTE — Progress Notes (Signed)
Daniel [name] 78 y.o.   Chief Complaint  Patient presents with  . Foot Swelling    both last month  . Hip Pain    LEFT x 3-4 days ago-on/off    HISTORY OF PRESENT ILLNESS: This is a 78 y.o. male complaining of swelling of both lower legs for the past month and redness to right lower leg for the past several days.  Also had left hip pain 3 days ago but is better now.  Denies injury.  Denies fever or chills.  Denies difficulty breathing or chest pain.  Able to ambulate with a cane without difficulty.  Denies any other significant symptoms.  HPI   Prior to Admission medications   Medication Sig Start Date End Date Taking? Authorizing Provider  aspirin 81 MG tablet Take 81 mg by mouth daily.   Yes [provider]  colchicine 0.6 MG tablet 1 tablet tid for 5 days with onset of gout symptoms 09/19/16  Yes Wardell Honour, MD  Vitamin E 400 units TABS Take by mouth.   Yes [provider]  amLODipine (NORVASC) 10 MG tablet Take 1 tablet (10 mg total) by mouth daily. 02/21/18 03/23/18  Horald Pollen, MD  cefadroxil (DURICEF) 500 MG capsule Take 1 capsule (500 mg total) by mouth 2 (two) times daily for 7 days. 01/14/19 01/21/19  Horald Pollen, MD  lisinopril-hydrochlorothiazide (PRINZIDE,ZESTORETIC) 20-25 MG tablet Take 1 tablet by mouth daily. 02/21/18 03/23/18  Horald Pollen, MD    No Known Allergies  Patient Active Problem List   Diagnosis Date Noted  . 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 LVH (left ventricular hypertrophy) 10/09/2016  . HTN (hypertension) 12/15/2013    Past Medical History:  Diagnosis Date  . Allergy   . Arthritis   . Chronic renal insufficiency   . Glaucoma   . Hypertension     Past Surgical History:  Procedure Laterality Date  . COLONOSCOPY    . EYE SURGERY     B cataract surgery. [name].    Social History   Socioeconomic  History  . Marital status: Married    Spouse name: Not on file  . Number of children: Not on file  . Years of education: Not on file  . Highest education level: Not on file  Occupational History  . Not on file  Social Needs  . Financial resource strain: Not on file  . Food insecurity    Worry: Not on file    Inability: Not on file  . Transportation needs    Medical: Not on file    Non-medical: Not on file  Tobacco Use  . Smoking status: Never Smoker  . Smokeless tobacco: Never Used  Substance and Sexual Activity  . Alcohol use: Yes    Comment: ocas  . Drug use: No  . Sexual activity: Not on file  Lifestyle  . Physical activity    Days per week: Not on file    Minutes per session: Not on file  . Stress: Not on file  Relationships  . Social Herbalist on phone: Not on file    Gets together: Not on file    Attends religious service: Not on file    Active member of club or organization: Not on file    Attends meetings of clubs or organizations: Not on file    Relationship  status: Not on file  . Intimate partner violence    Fear of current or ex partner: Not on file    Emotionally abused: Not on file    Physically abused: Not on file    Forced sexual activity: Not on file  Other Topics Concern  . Not on file  Social History Narrative   Marital status: widowed since 2010; married x 37 years. Not dating in 2018.      Children: 3 daughters; no sons; 7 grandchildren; 2 gg.      Lives: alone in house in neighborhood.     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       Tobacco:  Quit smoking 30 years ago.      Alcohol: weekends. Scotch.      Exercise: sporadic; cuts grass and trims hedges. Stays busy in yard.        Seatbelt:  100%      Guns: none      ADLs: drives; independent ADLs; buys groceries; pays bills.  Cut grass.  NO cane or assistant devices.      Advanced Directives: yes; No CPR; DNI/DNR.               Family History   Problem Relation Age of Onset  . Diabetes Mother   . Colon cancer Neg Hx   . Esophageal cancer Neg Hx   . Stomach cancer Neg Hx   . Pancreatic cancer Neg Hx      Review of Systems  Constitutional: Negative.  Negative for chills and fever.  HENT: Negative.  Negative for congestion and sore throat.   Respiratory: Negative.  Negative for cough and shortness of breath.   Cardiovascular: Positive for leg swelling. Negative for chest pain.  Gastrointestinal: Negative.  Negative for abdominal pain, diarrhea, nausea and vomiting.  Musculoskeletal: Negative for myalgias.  Skin: Positive for rash (Right lower leg).  Neurological: Negative for dizziness, sensory change, focal weakness and headaches.  All other systems reviewed and are negative.  Today's Vitals   01/14/19 1411  BP: (!) 148/76  Pulse: 68  Resp: 16  Temp: 99.3 F (37.4 C)  TempSrc: Oral  SpO2: 95%  Weight: 249 lb 12.8 oz (113.3 kg)  Height: 5\' 9"  (1.753 m)   Body mass index is 36.89 kg/m.   Physical Exam Constitutional:      Appearance: Normal appearance. He is obese.  HENT:     Head: Normocephalic.  Eyes:     Extraocular Movements: Extraocular movements intact.     Pupils: Pupils are equal, round, and reactive to light.  Neck:     Musculoskeletal: Normal range of motion.  Cardiovascular:     Rate and Rhythm: Normal rate.     Heart sounds: Normal heart sounds.  Pulmonary:     Effort: Pulmonary effort is normal.     Breath sounds: Normal breath sounds.  Musculoskeletal:     Comments: Lower extremities: Bilateral edema.  Neurovascularly intact with normal capillary refill distally.  Positive erythema on right shin area compatible with cellulitis.  No signs of DVT.  Skin:    General: Skin is warm and dry.     Capillary Refill: Capillary refill takes less than 2 seconds.     Findings: Rash present.  Neurological:     General: No focal deficit present.     Mental Status: He is alert and oriented to person,  place, and time.  Psychiatric:  Mood and Affect: Mood normal.        Behavior: Behavior normal.        ASSESSMENT & PLAN: Daniel [name] was seen today for foot swelling and hip pain.  Diagnoses and all orders for this visit:  Cellulitis of right lower leg -     cefadroxil (DURICEF) 500 MG capsule; Take 1 capsule (500 mg total) by mouth 2 (two) times daily for 7 days.  Need for prophylactic vaccination and inoculation against influenza -     Flu Vaccine QUAD High Dose(Fluad)  Chronic venous insufficiency  Essential hypertension  Renal insufficiency   Lab Results  Component Value Date   CREATININE 1.18 02/21/2018   BUN 12 02/21/2018   NA 143 02/21/2018   K 3.7 02/21/2018   CL 101 02/21/2018   CO2 26 02/21/2018    Patient Instructions       If you have lab work done today you will be contacted with your lab results within the next 2 weeks.  If you have not heard from [place] then please contact us. The fastest way to get your results is to register for My Chart.   IF you received an x-ray today, you will receive an invoice from [facility]. Please contact [facility] at [phone] with questions or concerns regarding your invoice.   IF you received labwork today, you will receive an invoice from [facility]. Please contact [facility] at [phone] with questions or concerns regarding your invoice.   Our billing staff will not be able to assist you with questions regarding bills from these companies.  You will be contacted with the lab results as soon as they are available. The fastest way to get your results is to activate your My Chart account. Instructions are located on the last page of this paperwork. If you have not heard from [place] regarding the results in 2 weeks, please contact this office.     Chronic Venous Insufficiency Chronic venous insufficiency is a condition where the leg veins cannot effectively pump blood from the legs to the heart.  This happens when the vein walls are either stretched, weakened, or damaged, or when the valves inside the vein are damaged. With the right treatment, you should be able to continue with an active life. This condition is also called venous stasis. What are the causes? Common causes of this condition include:  High blood pressure inside the veins (venous hypertension).  Sitting or standing too long, causing increased blood pressure in the leg veins.  A blood clot that blocks blood flow in a vein (deep vein thrombosis, DVT).  Inflammation of a vein (phlebitis) that causes a blood clot to form.  Tumors in the pelvis that cause blood to back up. What increases the risk? The following factors may make you more likely to develop this condition:  Having a family history of this condition.  Obesity.  Pregnancy.  Living without enough regular physical activity or exercise (sedentary lifestyle).  Smoking.  Having a job that requires long periods of standing or sitting in one place.  Being a certain age. Women in their 28s and 67s and men in their 22s are more likely to develop this condition. What are the signs or symptoms? Symptoms of this condition include:  Veins that are enlarged, bulging, or twisted (varicose veins).  Skin breakdown or ulcers.  Reddened skin or dark discoloration of skin on the leg between the knee and ankle.  Brown, smooth, tight, and painful skin just above the  ankle, usually on the inside of the leg (lipodermatosclerosis).  Swelling of the legs. How is this diagnosed? This condition may be diagnosed based on:  Your medical history.  A physical exam.  Tests, such as: ? A procedure that creates an image of a blood vessel and nearby organs and provides information about blood flow through the blood vessel (duplex ultrasound). ? A procedure that tests blood flow (plethysmography). ? A procedure that looks at the veins using X-ray and dye (venogram). How  is this treated? The goals of treatment are to help you return to an active life and to minimize pain or disability. Treatment depends on the severity of your condition, and it may include:  Wearing compression stockings. These can help relieve symptoms and help prevent your condition from getting worse. However, they do not cure the condition.  Sclerotherapy. This procedure involves an injection of a solution that shrinks damaged veins.  Surgery. This may involve: ? Removing a diseased vein (vein stripping). ? Cutting off blood flow through the vein (laser ablation surgery). ? Repairing or reconstructing a valve within the affected vein. Follow these instructions at home:      Wear compression stockings as told by your health care provider. These stockings help to prevent blood clots and reduce swelling in your legs.  Take over-the-counter and prescription medicines only as told by your health care provider.  Stay active by exercising, walking, or doing different activities. Ask your health care provider what activities are safe for you and how much exercise you need.  Drink enough fluid to keep your urine pale yellow.  Do not use any products that contain nicotine or tobacco, such as cigarettes, e-cigarettes, and chewing tobacco. If you need help quitting, ask your health care provider.  Keep all follow-up visits as told by your health care provider. This is important. Contact a health care provider if you:  Have redness, swelling, or more pain in the affected area.  See a red streak or line that goes up or down from the affected area.  Have skin breakdown or skin loss in the affected area, even if the breakdown is small.  Get an injury in the affected area. Get help right away if:  You get an injury and an open wound in the affected area.  You have: ? Severe pain that does not get better with medicine. ? Sudden numbness or weakness in the foot or ankle below the affected  area. ? Trouble moving your foot or ankle. ? A fever. ? Worse or persistent symptoms. ? Chest pain. ? Shortness of breath. Summary  Chronic venous insufficiency is a condition where the leg veins cannot effectively pump blood from the legs to the heart.  Chronic venous insufficiency occurs when the vein walls become stretched, weakened, or damaged, or when valves within the vein are damaged.  Treatment depends on how severe your condition is. It often involves wearing compression stockings and may involve having a procedure.  Make sure you stay active by exercising, walking, or doing different activities. Ask your health care provider what activities are safe for you and how much exercise you need. This information is not intended to replace advice given to you by your health care provider. Make sure you discuss any questions you have with your health care provider. Document Released: 09/04/2006 Document Revised: 01/22/2018 Document Reviewed: 01/22/2018 Elsevier Patient Education  [place].  Cellulitis, Adult  Cellulitis is a skin infection. The infected area is often warm,  red, swollen, and sore. It occurs most often in the arms and lower legs. It is very important to get treated for this condition. What are the causes? This condition is caused by bacteria. The bacteria enter through a break in the skin, such as a cut, burn, insect bite, open sore, or crack. What increases the risk? This condition is more likely to occur in people who:  Have a weak body defense system (immune system).  Have open cuts, burns, bites, or scrapes on the skin.  Are older than 77 years of age.  Have a blood sugar problem (diabetes).  Have a long-lasting (chronic) liver disease (cirrhosis) or kidney disease.  Are very overweight (obese).  Have a skin problem, such as: ? Itchy rash (eczema). ? Slow movement of blood in the veins (venous stasis). ? Fluid buildup below the skin (edema).   Have been treated with high-energy rays (radiation).  Use IV drugs. What are the signs or symptoms? Symptoms of this condition include:  Skin that is: ? Red. ? Streaking. ? Spotting. ? Swollen. ? Sore or painful when you touch it. ? Warm.  A fever.  Chills.  Blisters. How is this diagnosed? This condition is diagnosed based on:  Medical history.  Physical exam.  Blood tests.  Imaging tests. How is this treated? Treatment for this condition may include:  Medicines to treat infections or allergies.  Home care, such as: ? Rest. ? Placing cold or warm cloths (compresses) on the skin.  Hospital care, if the condition is very bad. Follow these instructions at home: Medicines  Take over-the-counter and prescription medicines only as told by your doctor.  If you were prescribed an antibiotic medicine, take it as told by your doctor. Do not stop taking it even if you start to feel better. General instructions   Drink enough fluid to keep your pee (urine) pale yellow.  Do not touch or rub the infected area.  Raise (elevate) the infected area above the level of your heart while you are sitting or lying down.  Place cold or warm cloths on the area as told by your doctor.  Keep all follow-up visits as told by your doctor. This is important. Contact a doctor if:  You have a fever.  You do not start to get better after 1-2 days of treatment.  Your bone or joint under the infected area starts to hurt after the skin has healed.  Your infection comes back. This can happen in the same area or another area.  You have a swollen bump in the area.  You have new symptoms.  You feel ill and have muscle aches and pains. Get help right away if:  Your symptoms get worse.  You feel very sleepy.  You throw up (vomit) or have watery poop (diarrhea) for a long time.  You see red streaks coming from the area.  Your red area gets larger.  Your red area turns dark in  color. These symptoms may represent a serious problem that is an emergency. Do not wait to see if the symptoms will go away. Get medical help right away. Call your local emergency services (911 in the [place] Do not drive yourself to the hospital. Summary  Cellulitis is a skin infection. The area is often warm, red, swollen, and sore.  This condition is treated with medicines, rest, and cold and warm cloths.  Take all medicines only as told by your doctor.  Tell your doctor if symptoms do not  start to get better after 1-2 days of treatment. This information is not intended to replace advice given to you by your health care provider. Make sure you discuss any questions you have with your health care provider. Document Released: 10/18/2007 Document Revised: 09/20/2017 Document Reviewed: 09/20/2017 Elsevier Patient Education  2020 Elsevier Inc.      Agustina Caroli, MD [facility]

## 2019-02-12 ENCOUNTER — Telehealth: Payer: Self-pay | Admitting: Emergency Medicine

## 2019-02-12 NOTE — Telephone Encounter (Signed)
Pt request refill   cefadroxil (DURICEF) 500 MG capsule  Pt states his leg is better but not all the way. Requesting another round of this med.   [facility] ([place]), [place] - [address] [phone] (Phone) [phone] (Fax)

## 2019-02-14 NOTE — Telephone Encounter (Signed)
Please advise 

## 2019-02-15 ENCOUNTER — Other Ambulatory Visit: Payer: Self-pay | Admitting: Emergency Medicine

## 2019-02-15 MED ORDER — CEFADROXIL 500 MG PO CAPS: 500.0000 mg | ORAL_CAPSULE | Freq: Two times a day (BID) | ORAL | 0 refills | Status: AC

## 2019-02-15 NOTE — Telephone Encounter (Signed)
Second round of Duricef 500 mg twice a day for 7 days prescribed.  Thanks.

## 2019-02-19 NOTE — Telephone Encounter (Signed)
Patient daughter called she has not received the medication for her dad . Hes leg is still swelling on the right side . Please advise  Call back [phone] Please call Pam ( Daughter of Patient)

## 2019-02-19 NOTE — Telephone Encounter (Signed)
Informed pt daughter that Rx was sent to pharmacy on 02/15/2019, she verbalized understanding.

## 2019-02-27 ENCOUNTER — Other Ambulatory Visit: Payer: Self-pay | Admitting: Emergency Medicine

## 2019-02-27 DIAGNOSIS — I10 Essential (primary) hypertension: Secondary | ICD-10-CM

## 2019-02-27 DIAGNOSIS — I517 Cardiomegaly: Secondary | ICD-10-CM

## 2019-02-27 NOTE — Telephone Encounter (Signed)
Request refill for amlodipine 10 mg  And lisinopril- hydrochlorothiazide 20-25mg .  These prescriptions had an end date of 03/23/18 for both. They were both prescribed on 02/21/2018

## 2019-02-27 NOTE — Telephone Encounter (Signed)
Medication: mLODipine (NORVASC) 10 MG tablet [id number]  ENDED, lisinopril-hydrochlorothiazide (PRINZIDE,ZESTORETIC) 20-25 MG tablet [id number]  ENDED Can a short term dosage be sent to the [place] remaining sent to [facility]   Has the patient contacted their pharmacy? Yes  (Agent: If no, request that the patient contact the pharmacy for the refill.) (Agent: If yes, when and what did the pharmacy advise?)  Preferred Pharmacy (with phone number or street name): [facility].  [place], [place] [phone] (Phone) [phone] (Fax)    Agent: Please be advised that RX refills may take up to 3 business days. We ask that you follow-up with your pharmacy.

## 2019-02-28 MED ORDER — AMLODIPINE BESYLATE 10 MG PO TABS: 10.0000 mg | ORAL_TABLET | Freq: Every day | ORAL | 0 refills | Status: DC

## 2019-02-28 MED ORDER — LISINOPRIL-HYDROCHLOROTHIAZIDE 20-25 MG PO TABS: 1.0000 | ORAL_TABLET | Freq: Every day | ORAL | 0 refills | Status: DC

## 2019-03-05 ENCOUNTER — Ambulatory Visit: Payer: Medicare Other

## 2019-03-06 ENCOUNTER — Encounter: Payer: Self-pay | Admitting: Internal Medicine

## 2019-03-06 ENCOUNTER — Other Ambulatory Visit: Payer: Self-pay

## 2019-03-06 ENCOUNTER — Ambulatory Visit (INDEPENDENT_AMBULATORY_CARE_PROVIDER_SITE_OTHER): Payer: Medicare Other | Admitting: Internal Medicine

## 2019-03-06 VITALS — BP 123/61 | HR 84 | Temp 99.0°F | Ht 69.0 in | Wt 243.6 lb

## 2019-03-06 DIAGNOSIS — Z87891 Personal history of nicotine dependence: Secondary | ICD-10-CM | POA: Diagnosis not present

## 2019-03-06 DIAGNOSIS — I517 Cardiomegaly: Secondary | ICD-10-CM

## 2019-03-06 DIAGNOSIS — B351 Tinea unguium: Secondary | ICD-10-CM

## 2019-03-06 DIAGNOSIS — I10 Essential (primary) hypertension: Secondary | ICD-10-CM

## 2019-03-06 DIAGNOSIS — Z0000 Encounter for general adult medical examination without abnormal findings: Secondary | ICD-10-CM | POA: Diagnosis not present

## 2019-03-06 DIAGNOSIS — I872 Venous insufficiency (chronic) (peripheral): Secondary | ICD-10-CM

## 2019-03-06 DIAGNOSIS — Z79899 Other long term (current) drug therapy: Secondary | ICD-10-CM | POA: Diagnosis not present

## 2019-03-06 MED ORDER — LISINOPRIL-HYDROCHLOROTHIAZIDE 20-25 MG PO TABS: 1.0000 | ORAL_TABLET | Freq: Every day | ORAL | 0 refills | Status: DC

## 2019-03-06 MED ORDER — AMLODIPINE BESYLATE 10 MG PO TABS: 10.0000 mg | ORAL_TABLET | Freq: Every day | ORAL | 0 refills | Status: DC

## 2019-03-06 NOTE — Progress Notes (Signed)
CC: Hypertension, Chronic venous insufficiency, Onychomycosis  HPI:  Daniel [name] is a 78 y.o. M with PMHx listed below presenting for Hypertension, Chronic venous insufficiency, Onychomycosis. Please see the A&P for the status of the patient's chronic medical problems.  Past Medical History:  Diagnosis Date  . Allergy   . Arthritis   . Chronic renal insufficiency   . Glaucoma   . Hypertension    Past Surgical History:  Procedure Laterality Date  . COLONOSCOPY    . EYE SURGERY     B cataract surgery. [name].   Family History  Problem Relation Age of Onset  . Diabetes Mother   . Colon cancer Neg Hx   . Esophageal cancer Neg Hx   . Stomach cancer Neg Hx   . Pancreatic cancer Neg Hx    Social History   Socioeconomic History  . Marital status: Married    Spouse name: Not on file  . Number of children: Not on file  . Years of education: Not on file  . Highest education level: Not on file  Occupational History  . Not on file  Social Needs  . Financial resource strain: Not on file  . Food insecurity    Worry: Not on file    Inability: Not on file  . Transportation needs    Medical: Not on file    Non-medical: Not on file  Tobacco Use  . Smoking status: Former Smoker    Quit date: 05/15/1968    Years since quitting: 50.8  . Smokeless tobacco: Never Used  Substance and Sexual Activity  . Alcohol use: Yes    Comment: ocas  . Drug use: No  . Sexual activity: Not on file  Lifestyle  . Physical activity    Days per week: Not on file    Minutes per session: Not on file  . Stress: Not on file  Relationships  . Social Herbalist on phone: Not on file    Gets together: Not on file    Attends religious service: Not on file    Active member of club or organization: Not on file    Attends meetings of clubs or organizations: Not on file    Relationship status: Not on file  . Intimate partner violence    Fear of current or ex partner: Not on file   Emotionally abused: Not on file    Physically abused: Not on file    Forced sexual activity: Not on file  Other Topics Concern  . Not on file  Social History Narrative   Marital status: widowed since 2010; married x 37 years. Not dating in 2018.      Children: 3 daughters; no sons; 7 grandchildren; 2 gg.      Lives: alone in house in neighborhood.     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       Tobacco:  Quit smoking 30 years ago.      Alcohol: weekends. Scotch.      Exercise: sporadic; cuts grass and trims hedges. Stays busy in yard.        Seatbelt:  100%      Guns: none      ADLs: drives; independent ADLs; buys groceries; pays bills.  Cut grass.  NO cane or assistant devices.      Advanced Directives: yes; No CPR; DNI/DNR.              Review of Systems:  Performed and all others negative.  Physical Exam:  Vitals:   03/06/19 1026  BP: 123/61  Pulse: 84  Temp: 99 F (37.2 C)  TempSrc: Oral  SpO2: 98%  Weight: 243 lb 9.6 oz (110.5 kg)  Height: 5\' 9"  (1.753 m)   Physical Exam Constitutional:      General: He is not in acute distress.    Appearance: Normal appearance.  Cardiovascular:     Rate and Rhythm: Normal rate and regular rhythm.     Pulses: Normal pulses.     Heart sounds: Normal heart sounds.  Pulmonary:     Effort: Pulmonary effort is normal. No respiratory distress.     Comments: Trace LLL Crackles Abdominal:     General: Bowel sounds are normal. There is no distension.     Palpations: Abdomen is soft.     Tenderness: There is no abdominal tenderness.  Musculoskeletal:        General: No deformity.     Right lower leg: Edema present.     Left lower leg: Edema present.     Comments: Moderate edema to calf (chronic)  Skin:    General: Skin is warm and dry.  Neurological:     General: No focal deficit present.     Mental Status: Mental status is at baseline.    Assessment & Plan:   See Encounters Tab for problem  based charting.  Patient discussed with Dr. Evette Doffing

## 2019-03-06 NOTE — Assessment & Plan Note (Signed)
Significant Onychomycosis noted on exam. Patient does some home pedicures per his daughter. Will refer to podiatry for nail cutting and foot care given is increase risk of infection if foot care is too aggressive and his nail changes. - Referral to Podiatry

## 2019-03-06 NOTE — Assessment & Plan Note (Signed)
Patient has a history of venous insufficiency. He has had some worsening for the past 2-3 months. He was treated by his previous PCP for cellulitis with some improvement in his edema. Skin changes today are consistent with venous insufficiency without signs of cellulitis. He states it looks improved to him as well. He denies fevers or chills.   He has tried compression stockings once in the past but they were too tight and difficult to put on. We will measure him for a better fit today and provide a pair of ted hose. We discussed that his amlodipine may be playing a role, but we will start with compression for now, given his good BP control and this being our first visit. - Ted hose daily - Ambulate - Keep feet elevated when sitting if able - Baseline photos in chart

## 2019-03-06 NOTE — Assessment & Plan Note (Signed)
BP at goal today, 123/61. We will continue current medications. We discussed his edema today and that if he does not improve with compression, we may need to have him try holding amlodipine to see if there is a component of medications side effect. - Lisinopril-HCTZ 20-25mg  Daily - Amlodipine 10mg  Daily

## 2019-03-06 NOTE — Patient Instructions (Addendum)
Thank you for allowing [place] to care for you  For you acute on chronic leg swelling - This is due to chronic poor blood blow in your lower extremities - Your skin infection appears to have been well treated, monitor for recurrence - We have measured and provided compression stockings to help with your swelling  For your high blood pressure - BP looks good! - Medications refilled today  For your toe nail changes - This is due to fungus - Referral to foot doctor placed  We are checking labs today, you will be contacted with the results

## 2019-03-07 ENCOUNTER — Encounter: Payer: Self-pay | Admitting: Internal Medicine

## 2019-03-07 LAB — CBC WITH DIFFERENTIAL/PLATELET
Basophils Absolute: 0 10*3/uL (ref 0.0–0.2)
Basos: 0 %
EOS (ABSOLUTE): 0 10*3/uL (ref 0.0–0.4)
Eos: 0 %
Hematocrit: 42.7 % (ref 37.5–51.0)
Hemoglobin: 13.8 g/dL (ref 13.0–17.7)
Immature Grans (Abs): 0 10*3/uL (ref 0.0–0.1)
Immature Granulocytes: 0 %
Lymphocytes Absolute: 1.6 10*3/uL (ref 0.7–3.1)
Lymphs: 16 %
MCH: 26.9 pg (ref 26.6–33.0)
MCHC: 32.3 g/dL (ref 31.5–35.7)
MCV: 83 fL (ref 79–97)
Monocytes Absolute: 0.8 10*3/uL (ref 0.1–0.9)
Monocytes: 9 %
Neutrophils Absolute: 7.3 10*3/uL — ABNORMAL HIGH (ref 1.4–7.0)
Neutrophils: 75 %
Platelets: 271 10*3/uL (ref 150–450)
RBC: 5.13 x10E6/uL (ref 4.14–5.80)
RDW: 13.9 % (ref 11.6–15.4)
WBC: 9.7 10*3/uL (ref 3.4–10.8)

## 2019-03-07 LAB — BMP8+ANION GAP
Anion Gap: 17 mmol/L (ref 10.0–18.0)
BUN/Creatinine Ratio: 18 (ref 10–24)
BUN: 25 mg/dL (ref 8–27)
CO2: 27 mmol/L (ref 20–29)
Calcium: 9.5 mg/dL (ref 8.6–10.2)
Chloride: 97 mmol/L (ref 96–106)
Creatinine, Ser: 1.41 mg/dL — ABNORMAL HIGH (ref 0.76–1.27)
GFR calc Af Amer: 55 mL/min/{1.73_m2} — ABNORMAL LOW (ref >59)
GFR calc non Af Amer: 48 mL/min/{1.73_m2} — ABNORMAL LOW (ref >59)
Glucose: 95 mg/dL (ref 65–99)
Potassium: 4.1 mmol/L (ref 3.5–5.2)
Sodium: 141 mmol/L (ref 134–144)

## 2019-03-07 NOTE — Progress Notes (Signed)
Patient mailed letter with stable/normal CBC and BMP results.

## 2019-03-07 NOTE — Progress Notes (Signed)
Internal Medicine Clinic Attending  Case discussed with Dr. Melvin  at the time of the visit.  We reviewed the resident's history and exam and pertinent patient test results.  I agree with the assessment, diagnosis, and plan of care documented in the resident's note.  

## 2019-03-13 ENCOUNTER — Encounter: Payer: Self-pay | Admitting: *Deleted

## 2019-03-13 ENCOUNTER — Other Ambulatory Visit: Payer: Self-pay | Admitting: Internal Medicine

## 2019-03-13 DIAGNOSIS — I517 Cardiomegaly: Secondary | ICD-10-CM

## 2019-03-13 DIAGNOSIS — I10 Essential (primary) hypertension: Secondary | ICD-10-CM

## 2019-03-28 ENCOUNTER — Other Ambulatory Visit: Payer: Self-pay

## 2019-03-28 ENCOUNTER — Ambulatory Visit: Payer: Medicare Other | Admitting: Podiatry

## 2019-03-28 ENCOUNTER — Encounter: Payer: Self-pay | Admitting: Podiatry

## 2019-03-28 VITALS — BP 130/94

## 2019-03-28 DIAGNOSIS — B351 Tinea unguium: Secondary | ICD-10-CM | POA: Diagnosis not present

## 2019-03-28 DIAGNOSIS — L600 Ingrowing nail: Secondary | ICD-10-CM

## 2019-03-28 DIAGNOSIS — R600 Localized edema: Secondary | ICD-10-CM

## 2019-03-28 DIAGNOSIS — M79675 Pain in left toe(s): Secondary | ICD-10-CM

## 2019-03-28 DIAGNOSIS — M79674 Pain in right toe(s): Secondary | ICD-10-CM | POA: Diagnosis not present

## 2019-04-01 NOTE — Progress Notes (Signed)
Subjective:   Patient ID: Daniel [name], male   DOB: 78 y.o.   MRN: [id number]   HPI Patient presents with chronic long-term thickness and pain of all nails 1-5 both feet and states that he cannot take care of them himself and is concerned about circulation.  Patient does not currently smoke   Review of Systems  All other systems reviewed and are negative.       Objective:  Physical Exam Vitals signs and nursing note reviewed.  Constitutional:      Appearance: He is well-developed.  Pulmonary:     Effort: Pulmonary effort is normal.  Musculoskeletal: Normal range of motion.  Skin:    General: Skin is warm.  Neurological:     Mental Status: He is alert.     Neurovascular status found to be mildly diminished bilateral but intact with hair growth adequate with patient found to have thick yellow brittle nailbeds 1-5 both feet that are painful incurvated and hard for him to take care of.  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

## 2019-05-07 ENCOUNTER — Observation Stay (HOSPITAL_COMMUNITY): Payer: Medicare Other

## 2019-05-07 ENCOUNTER — Ambulatory Visit (INDEPENDENT_AMBULATORY_CARE_PROVIDER_SITE_OTHER): Payer: Medicare Other | Admitting: Internal Medicine

## 2019-05-07 ENCOUNTER — Other Ambulatory Visit: Payer: Self-pay

## 2019-05-07 ENCOUNTER — Observation Stay (HOSPITAL_COMMUNITY)
Admission: AD | Admit: 2019-05-07 | Discharge: 2019-05-08 | Disposition: A | Discharge status: Home / Self Care | Payer: Medicare Other | Source: Ambulatory Visit | Attending: Internal Medicine | Admitting: Internal Medicine

## 2019-05-07 ENCOUNTER — Encounter: Payer: Self-pay | Admitting: Internal Medicine

## 2019-05-07 VITALS — BP 113/45 | HR 60 | Wt 232.8 lb

## 2019-05-07 DIAGNOSIS — M1990 Unspecified osteoarthritis, unspecified site: Secondary | ICD-10-CM | POA: Diagnosis not present

## 2019-05-07 DIAGNOSIS — I129 Hypertensive chronic kidney disease with stage 1 through stage 4 chronic kidney disease, or unspecified chronic kidney disease: Secondary | ICD-10-CM | POA: Diagnosis not present

## 2019-05-07 DIAGNOSIS — I10 Essential (primary) hypertension: Secondary | ICD-10-CM

## 2019-05-07 DIAGNOSIS — L0390 Cellulitis, unspecified: Secondary | ICD-10-CM | POA: Diagnosis present

## 2019-05-07 DIAGNOSIS — N189 Chronic kidney disease, unspecified: Secondary | ICD-10-CM | POA: Insufficient documentation

## 2019-05-07 DIAGNOSIS — Z23 Encounter for immunization: Secondary | ICD-10-CM | POA: Insufficient documentation

## 2019-05-07 DIAGNOSIS — Z833 Family history of diabetes mellitus: Secondary | ICD-10-CM | POA: Insufficient documentation

## 2019-05-07 DIAGNOSIS — L03116 Cellulitis of left lower limb: Secondary | ICD-10-CM

## 2019-05-07 DIAGNOSIS — H409 Unspecified glaucoma: Secondary | ICD-10-CM | POA: Insufficient documentation

## 2019-05-07 DIAGNOSIS — Z20828 Contact with and (suspected) exposure to other viral communicable diseases: Secondary | ICD-10-CM | POA: Diagnosis not present

## 2019-05-07 DIAGNOSIS — B351 Tinea unguium: Secondary | ICD-10-CM

## 2019-05-07 DIAGNOSIS — Z79899 Other long term (current) drug therapy: Secondary | ICD-10-CM | POA: Insufficient documentation

## 2019-05-07 DIAGNOSIS — L03818 Cellulitis of other sites: Secondary | ICD-10-CM

## 2019-05-07 DIAGNOSIS — I872 Venous insufficiency (chronic) (peripheral): Secondary | ICD-10-CM | POA: Diagnosis not present

## 2019-05-07 DIAGNOSIS — S81802A Unspecified open wound, left lower leg, initial encounter: Secondary | ICD-10-CM | POA: Diagnosis not present

## 2019-05-07 HISTORY — DX: Cellulitis, unspecified: L03.90

## 2019-05-07 LAB — CBC WITH DIFFERENTIAL/PLATELET
Abs Immature Granulocytes: 0.03 10*3/uL (ref 0.00–0.07)
Basophils Absolute: 0 10*3/uL (ref 0.0–0.1)
Basophils Relative: 0 %
Eosinophils Absolute: 0 10*3/uL (ref 0.0–0.5)
Eosinophils Relative: 0 %
HCT: 38.8 % — ABNORMAL LOW (ref 39.0–52.0)
Hemoglobin: 11.8 g/dL — ABNORMAL LOW (ref 13.0–17.0)
Immature Granulocytes: 0 %
Lymphocytes Relative: 14 %
Lymphs Abs: 1.7 10*3/uL (ref 0.7–4.0)
MCH: 25.8 pg — ABNORMAL LOW (ref 26.0–34.0)
MCHC: 30.4 g/dL (ref 30.0–36.0)
MCV: 84.9 fL (ref 80.0–100.0)
Monocytes Absolute: 0.9 10*3/uL (ref 0.1–1.0)
Monocytes Relative: 7 %
Neutro Abs: 9.9 10*3/uL — ABNORMAL HIGH (ref 1.7–7.7)
Neutrophils Relative %: 79 %
Platelets: 387 10*3/uL (ref 150–400)
RBC: 4.57 MIL/uL (ref 4.22–5.81)
RDW: 15.4 % (ref 11.5–15.5)
WBC: 12.6 10*3/uL — ABNORMAL HIGH (ref 4.0–10.5)
nRBC: 0 % (ref 0.0–0.2)

## 2019-05-07 LAB — BASIC METABOLIC PANEL
Anion gap: 10 (ref 5–15)
BUN: 32 mg/dL — ABNORMAL HIGH (ref 8–23)
CO2: 31 mmol/L (ref 22–32)
Calcium: 9 mg/dL (ref 8.9–10.3)
Chloride: 100 mmol/L (ref 98–111)
Creatinine, Ser: 1.58 mg/dL — ABNORMAL HIGH (ref 0.61–1.24)
GFR calc Af Amer: 48 mL/min — ABNORMAL LOW (ref >60)
GFR calc non Af Amer: 42 mL/min — ABNORMAL LOW (ref >60)
Glucose, Bld: 106 mg/dL — ABNORMAL HIGH (ref 70–99)
Potassium: 4 mmol/L (ref 3.5–5.1)
Sodium: 141 mmol/L (ref 135–145)

## 2019-05-07 LAB — SARS CORONAVIRUS 2 (TAT 6-24 HRS): SARS Coronavirus 2: NEGATIVE

## 2019-05-07 IMAGING — DX DG TIBIA/FIBULA 2V*L*
3 series · 3 of 3 positions shown · non-contrast
Comparison: None

CLINICAL DATA: LEFT lower leg wounds anteriorly, cellulitis

EXAM:
LEFT TIBIA AND FIBULA - 2 VIEW

[tibia ap]
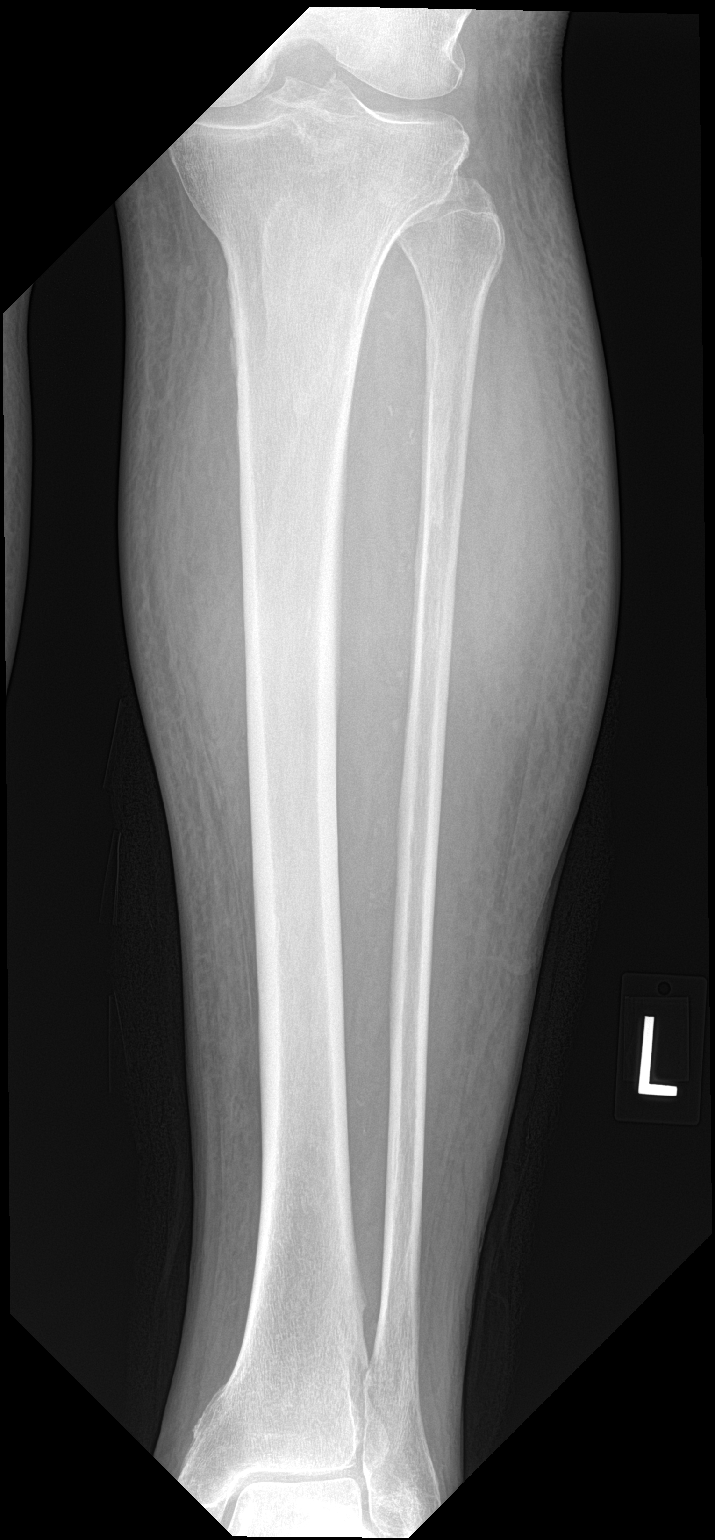

[tibia lat (1 of 2)]
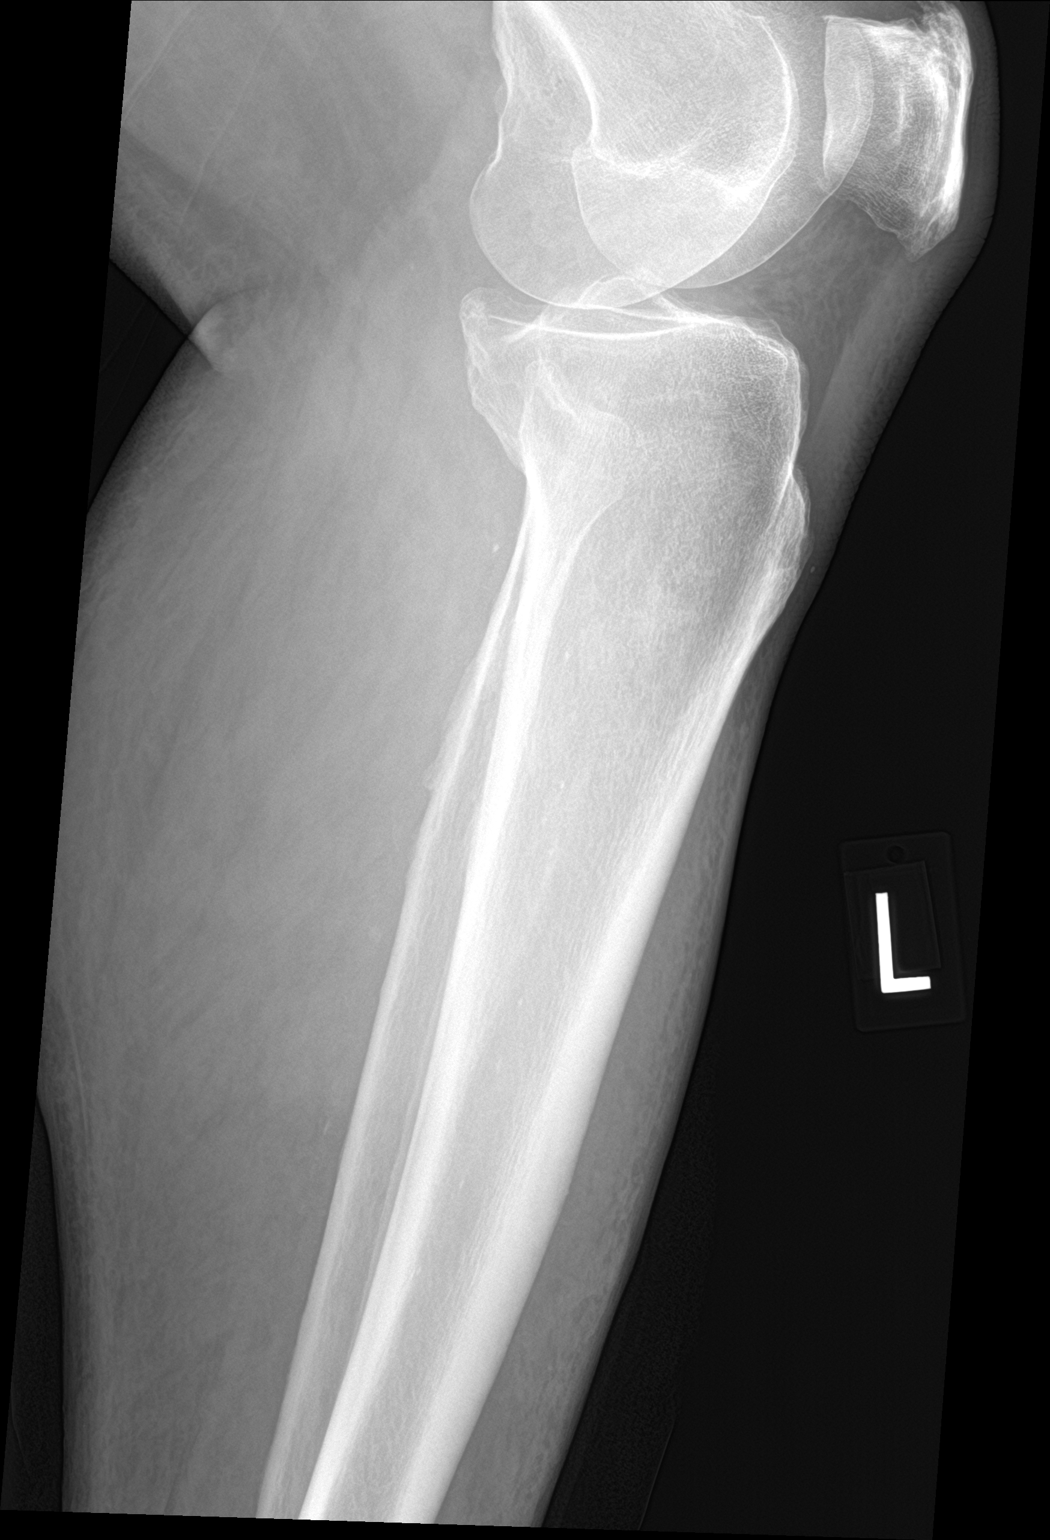

[tibia lat (2 of 2)]
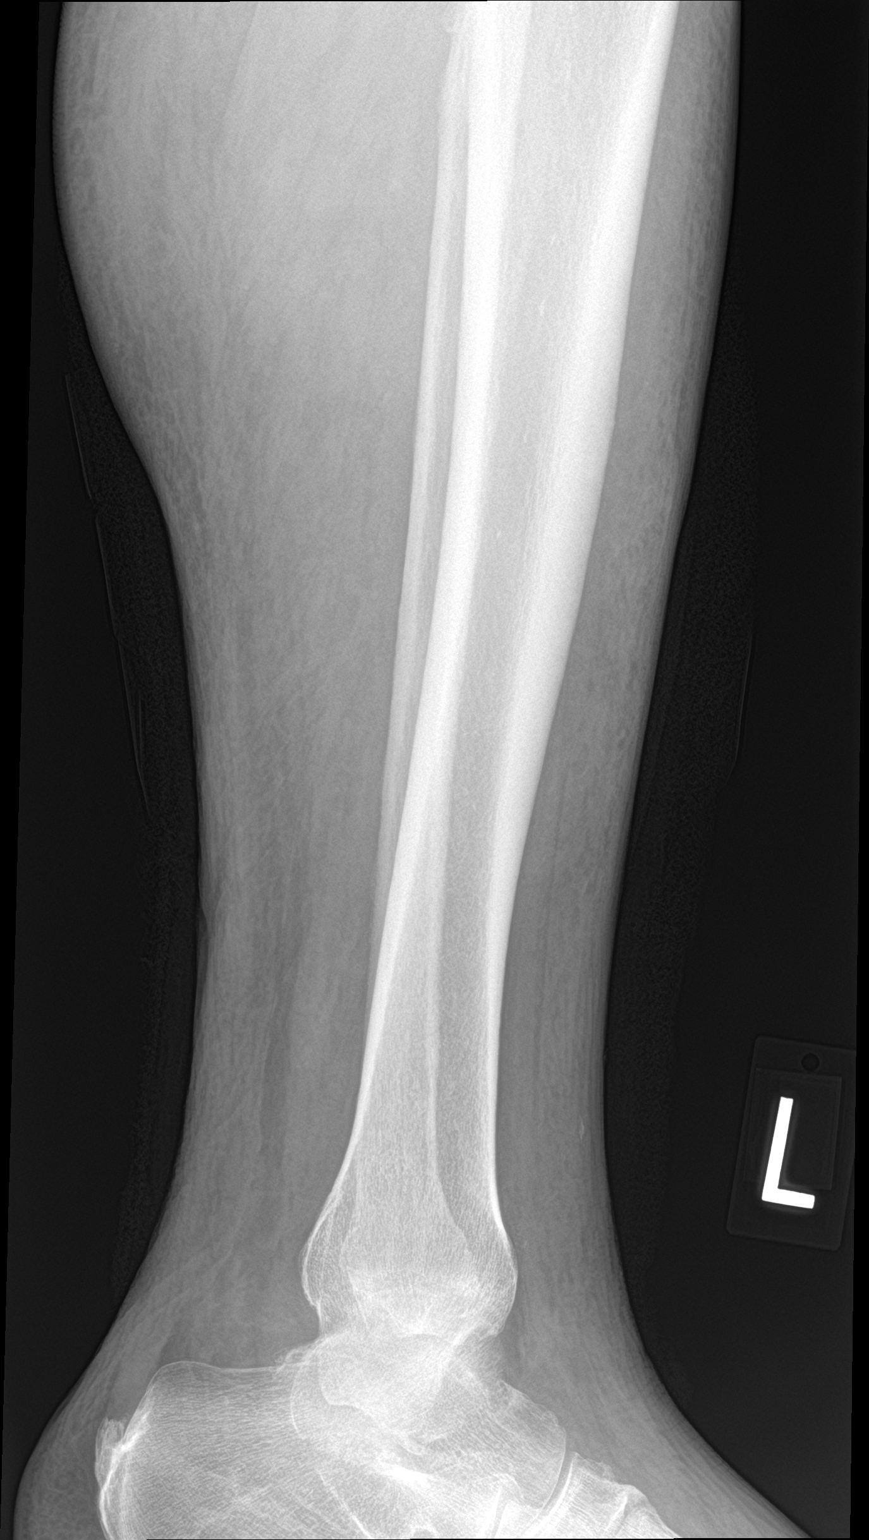

[3 of 3 positions shown; findings below may reference images not displayed]

FINDINGS: Diffuse soft tissue swelling.

Mild osseous demineralization.

Joint spaces preserved.

Patellar spurs at quadriceps and patellar tendon insertions.

No acute fracture, dislocation, or bone destruction.
IMPRESSION: Diffuse soft tissue swelling without acute bony abnormalities.

## 2019-05-07 MED ORDER — ACETAMINOPHEN 650 MG RE SUPP: 650.0000 mg | Freq: Four times a day (QID) | RECTAL | Status: DC | PRN

## 2019-05-07 MED ORDER — ONDANSETRON HCL 4 MG PO TABS: 4.0000 mg | ORAL_TABLET | Freq: Four times a day (QID) | ORAL | Status: DC | PRN

## 2019-05-07 MED ORDER — AMLODIPINE BESYLATE 10 MG PO TABS
10.0000 mg | ORAL_TABLET | Freq: Every day | ORAL | Status: DC
  Administered 2019-05-08: 10 mg via ORAL
  Filled 2019-05-07: qty 1

## 2019-05-07 MED ORDER — ONDANSETRON HCL 4 MG/2ML IJ SOLN: 4.0000 mg | Freq: Four times a day (QID) | INTRAMUSCULAR | Status: DC | PRN

## 2019-05-07 MED ORDER — HYDROCHLOROTHIAZIDE 25 MG PO TABS
25.0000 mg | ORAL_TABLET | Freq: Every day | ORAL | Status: DC
  Administered 2019-05-08: 25 mg via ORAL
  Filled 2019-05-07: qty 1

## 2019-05-07 MED ORDER — PNEUMOCOCCAL VAC POLYVALENT 25 MCG/0.5ML IJ INJ
0.5000 mL | INJECTION | INTRAMUSCULAR | Status: AC
  Administered 2019-05-08: 0.5 mL via INTRAMUSCULAR
  Filled 2019-05-07: qty 0.5

## 2019-05-07 MED ORDER — ACETAMINOPHEN 325 MG PO TABS
650.0000 mg | ORAL_TABLET | Freq: Four times a day (QID) | ORAL | Status: DC | PRN
  Administered 2019-05-07: 18:00:00 650 mg via ORAL
  Filled 2019-05-07: qty 2

## 2019-05-07 MED ORDER — ENOXAPARIN SODIUM 40 MG/0.4ML ~~LOC~~ SOLN
40.0000 mg | SUBCUTANEOUS | Status: DC
  Administered 2019-05-07: 22:00:00 40 mg via SUBCUTANEOUS
  Filled 2019-05-07: qty 0.4

## 2019-05-07 MED ORDER — SODIUM CHLORIDE 0.9 % IV SOLN
2.0000 g | INTRAVENOUS | Status: DC
  Administered 2019-05-07 – 2019-05-08 (×2): 2 g via INTRAVENOUS
  Filled 2019-05-07 (×2): qty 20

## 2019-05-07 MED ORDER — LISINOPRIL 20 MG PO TABS
20.0000 mg | ORAL_TABLET | Freq: Every day | ORAL | Status: DC
  Administered 2019-05-08: 20 mg via ORAL
  Filled 2019-05-07: qty 1

## 2019-05-07 NOTE — Plan of Care (Signed)
  Problem: Education: Goal: Knowledge of General Education information will improve Description: Including pain rating scale, medication(s)/side effects and non-pharmacologic comfort measures Outcome: Progressing   Problem: Safety: Goal: Ability to remain free from injury will improve Outcome: Progressing   

## 2019-05-07 NOTE — Assessment & Plan Note (Signed)
This was not addressed during this visit.

## 2019-05-07 NOTE — Progress Notes (Signed)
1230 Received pt as direct admit from the Dr's office. A&O x4. Left lower anterior leg wounds assessed, measured and dressing changed. Left lower leg (posterior) with closed blister like lesions.  IV antibiotic started.

## 2019-05-07 NOTE — H&P (Signed)
Date: 05/07/2019               Patient Name:  Daniel [name] MRN: [id number]  DOB: [birth year] Age / Sex: 78 y.o., male   PCP: Maudie Mercury, MD         Medical Service: Internal Medicine Teaching Service         Attending Physician: Dr. Velna Ochs, MD    First Contact: Dr. Gilford Rile Pager: [phone]  Second Contact: Dr. Truman Hayward Pager: [phone]       After Hours (After 5p/  First Contact Pager: [phone]  weekends / holidays): Second Contact Pager: [phone]   Chief Complaint: LLE wounds  History of Present Illness: Daniel [name] is a 78 y.o male with chronic venous insufficiency, onychomycosis, and hypertension who presented to the [facility] with left lower extremity leg wounds. History was obtained via the patient and his daughter.  Patient states that five weeks ago he was working on his bathroom and Scientist, research (medical). He was standing on a folding chair when he subsequently lost his balance. The folding chair collapsed and scraped up his left lower extremity. He also experienced a laceration on his right lower extremity. He did not seek medical care. Over the course of the last 4 to 5 weeks his right lower extremity wound has healed however his left lower extremity wounds have seem to progress. He is now having pain in his left lower extremity primarily whenever he tries to ambulate. He is also noticed clear drainage. He has been cleaning his wounds with hydrogen peroxide and wrapping them in bandages. He is not tried anything else. He states that he is never had an episode of cellulitis before. And has never had similar wounds in the past. He otherwise has been feeling well and denies headaches, visual changes, shortness of breath, cough, chest pain, abdominal pain, diarrhea, constipation, dysuria, polyuria, fevers/chills, nausea/vomiting, myalgias, arthralgias.  He denies claudication symptoms in his lower extremities. He states that he is very active and typically works outside a lot. He  is never hindered by shortness of breath or chest pain.  Meds:  No current facility-administered medications on file prior to encounter.   Current Outpatient Medications on File Prior to Encounter  Medication Sig Dispense Refill  . amLODipine (NORVASC) 10 MG tablet TAKE 1 TABLET BY MOUTH  DAILY 90 tablet 1  . colchicine 0.6 MG tablet 1 tablet tid for 5 days with onset of gout symptoms 15 tablet 2  . lisinopril-hydrochlorothiazide (ZESTORETIC) 20-25 MG tablet TAKE 1 TABLET BY MOUTH  DAILY 90 tablet 1  . Vitamin E 400 units TABS Take by mouth.     Allergies: Allergies as of 05/07/2019  . (No Known Allergies)   Past Medical History:  Diagnosis Date  . Allergy   . Arthritis   . Chronic renal insufficiency   . Glaucoma   . Hypertension    Family History  Problem Relation Age of Onset  . Diabetes Mother   . Colon cancer Neg Hx   . Esophageal cancer Neg Hx   . Stomach cancer Neg Hx   . 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 He is very active and does a lot of outdoor work. He has never smoked. He is an occasional drinker. He is never used illicit substances.  Review of Systems: A complete ROS was negative except as per HPI.   Physical Exam: Vitals with  BMI 05/07/2019 05/07/2019 05/07/2019  Height - - -  Weight - - 232 lbs 13 oz  BMI - - -  Systolic [id number]  Diastolic 45 55 48  Pulse 50 71 91   General: Obese male in no acute distress HENT: Normocephalic, atraumatic, moist mucus membranes Pulm: Good air movement with no wheezing or crackles  CV: RRR, no murmurs, no rubs  Abdomen: Active bowel sounds, soft, non-distended, no tenderness to palpation  Extremities: Moderate pitting edema in the BLE, pulse dopplered   Skin: Warm and dry, multiple wounds on the LLE draining clear fluid, no subcutaneous gas or crepitus appreciated, no purulent discharge or fluctuating mass is felt. Neuro: Alert  and oriented x 3  Assessment & Plan by Problem: Principal Problem:   Cellulitis Active Problems:   HTN (hypertension)   Onychomycosis   Chronic venous insufficiency  Daniel [name] is a 78 y.o male with chronic venous insufficiency, onychomycosis, and hypertension who presented to the [facility] with left lower extremity leg wounds. On physical exam he had multiple lacerations of the left lower extremity that were draining clear/serosanguinous fluid along with erythema and tenderness to palpation. Admission was requested for treatment of cellulitis and wound care consult.  Non-purulent cellulitis Chronic venous insufficiency  - Patient is hemodynamically stable and afebrile however physical exam is consistent with non-purulent cellulitis - Will obtain CBC and BMP - Start ceftriaxone 2 g every 24 hours - Consult wound care - Elevate legs as much as possible.  HTN - Check BMP  - Continue amlodipine 10 mg once daily, lisinopril 20 mg once daily, and hydrochlorothiazide 25 mg once daily  Diet: Regular  VTE ppx: Lovenox  CODE STATUS: Full Code  Dispo: Admit patient to Observation with expected length of stay less than 2 midnights.  SignedIna Homes, MD 05/07/2019, 11:17 AM  Pager: [phone]

## 2019-05-07 NOTE — Progress Notes (Signed)
   CC: LLE pain   HPI:  [name] is a 78 y.o. year-old male with PMH listed below who presents to clinic for LLE pain. Please see problem based assessment and plan for further details.   Past Medical History:  Diagnosis Date  . Allergy   . Arthritis   . Chronic renal insufficiency   . Glaucoma   . Hypertension    Review of Systems:   Review of Systems  Constitutional: Negative for chills, fever and malaise/fatigue.  Musculoskeletal: Negative for joint pain.       LLE pain below the knee    Physical Exam:  Vitals:   05/07/19 0920 05/07/19 0935 05/07/19 1112  BP: (!) 117/48 (!) 115/55 (!) 113/45  Pulse: 91 71 60  SpO2: 98%    Weight: 232 lb 12.8 oz (105.6 kg)      General: Chronically ill-appearing male in no acute distress Derm: there 2 open wounds on L shin with surrounding erythema and warmth extending to the foot. There is also 2 large blisters on same leg, one above the ankle and one above heel.  Onychomycosis noted on bilateral feet.            Assessment & Plan:   See Encounters Tab for problem based charting.  Patient discussed with Dr. Daryll Drown

## 2019-05-07 NOTE — Assessment & Plan Note (Addendum)
Patient presents with a left lower extremity wound x 3 weeks as a result of a fall.  He has been trying to take care of it at home but the wound is not healing. He is having difficulty ambulating due to pain. His leg is now erythematous, warmth and edematous. He also has 2 large blisters on the same leg that are draining serous fluid.  He denies systemic signs of infection such as fevers or chills.  Vital signs are stable.  On exam, there is erythema and warmth is extending down into the left foot.  No purulent drainage noted. Recommended admission for IV antibiotics and wound care given extent of wounds and limited availability at wound care center. Discussed admission with Dr. Adan Sis.

## 2019-05-07 NOTE — Consult Note (Signed)
[facility] Nurse Consult Note: Reason for Consult: Left LE ulcerations, full and partial thickness. MD has taken photographs and attached to their note.  Bedside RN has provided measurements. Both are appreciated. Wound type: suspect arterial insufficiency or mixed etiology (venous and arterial) Pressure Injury POA: N/A Measurement: Per Nursing Flow Sheet Wound bed: Anterior LE wounds with 85% pink, pale moist lesions.  Lateral LE with dry lesion.  Posterior LE with dry flaking lesions. Drainage (amount, consistency, odor) small to moderate amounts of serous drainge Periwound: Dry with hemosiderin staining and evidence of previous wound healing (scarring) Dressing procedure/placement/frequency: I have provided Nursing with guidance for topical care of the wounds using folded pieces of the antimicrobial and astringent nonadherent, xeroform gauze and daily changes.  This will be secured with Kerlix roll gauze and topped with an ACE bandage wrapped in a spiral fashion for mild compression.  If you agree, consider Vascular surgery consultation for assessment of circulatory status. In the interim, ABIs could be obtained and the results evaluated.  [facility] nursing team will not follow, but will remain available to this patient, the nursing and medical teams.  Please re-consult if needed. Thanks, Maudie Flakes, MSN, RN, [place], Arther Abbott  Pager# [phone]

## 2019-05-08 ENCOUNTER — Encounter (HOSPITAL_COMMUNITY): Payer: Self-pay | Admitting: Internal Medicine

## 2019-05-08 DIAGNOSIS — M1990 Unspecified osteoarthritis, unspecified site: Secondary | ICD-10-CM | POA: Diagnosis not present

## 2019-05-08 DIAGNOSIS — Z833 Family history of diabetes mellitus: Secondary | ICD-10-CM | POA: Diagnosis not present

## 2019-05-08 DIAGNOSIS — I129 Hypertensive chronic kidney disease with stage 1 through stage 4 chronic kidney disease, or unspecified chronic kidney disease: Secondary | ICD-10-CM | POA: Diagnosis not present

## 2019-05-08 DIAGNOSIS — B351 Tinea unguium: Secondary | ICD-10-CM

## 2019-05-08 DIAGNOSIS — Z23 Encounter for immunization: Secondary | ICD-10-CM | POA: Diagnosis not present

## 2019-05-08 DIAGNOSIS — I872 Venous insufficiency (chronic) (peripheral): Secondary | ICD-10-CM

## 2019-05-08 DIAGNOSIS — H409 Unspecified glaucoma: Secondary | ICD-10-CM | POA: Diagnosis not present

## 2019-05-08 DIAGNOSIS — N189 Chronic kidney disease, unspecified: Secondary | ICD-10-CM | POA: Diagnosis not present

## 2019-05-08 DIAGNOSIS — Z79899 Other long term (current) drug therapy: Secondary | ICD-10-CM

## 2019-05-08 DIAGNOSIS — L03116 Cellulitis of left lower limb: Secondary | ICD-10-CM

## 2019-05-08 LAB — BASIC METABOLIC PANEL
Anion gap: 11 (ref 5–15)
BUN: 22 mg/dL (ref 8–23)
CO2: 31 mmol/L (ref 22–32)
Calcium: 8.8 mg/dL — ABNORMAL LOW (ref 8.9–10.3)
Chloride: 99 mmol/L (ref 98–111)
Creatinine, Ser: 1.36 mg/dL — ABNORMAL HIGH (ref 0.61–1.24)
GFR calc Af Amer: 58 mL/min — ABNORMAL LOW (ref >60)
GFR calc non Af Amer: 50 mL/min — ABNORMAL LOW (ref >60)
Glucose, Bld: 127 mg/dL — ABNORMAL HIGH (ref 70–99)
Potassium: 3.6 mmol/L (ref 3.5–5.1)
Sodium: 141 mmol/L (ref 135–145)

## 2019-05-08 LAB — CBC
HCT: 38.9 % — ABNORMAL LOW (ref 39.0–52.0)
Hemoglobin: 11.8 g/dL — ABNORMAL LOW (ref 13.0–17.0)
MCH: 25.3 pg — ABNORMAL LOW (ref 26.0–34.0)
MCHC: 30.3 g/dL (ref 30.0–36.0)
MCV: 83.5 fL (ref 80.0–100.0)
Platelets: 324 10*3/uL (ref 150–400)
RBC: 4.66 MIL/uL (ref 4.22–5.81)
RDW: 15.1 % (ref 11.5–15.5)
WBC: 10.3 10*3/uL (ref 4.0–10.5)
nRBC: 0 % (ref 0.0–0.2)

## 2019-05-08 MED ORDER — CEPHALEXIN 500 MG PO CAPS: 500.0000 mg | ORAL_CAPSULE | Freq: Two times a day (BID) | ORAL | 0 refills | Status: AC

## 2019-05-08 MED FILL — CEPHALEXIN 500 MG CAPSULE: 500 | 5 days supply | Qty: 10 | Fill #0

## 2019-05-08 NOTE — Progress Notes (Signed)
Internal Medicine Attending Note:  I have seen and evaluated this patient and I have discussed the plan of care with the house staff. Please see their note for complete details. I concur with their findings.  Please see my attested H&P from today.   Velna Ochs, MD 05/08/2019, 12:00 PM

## 2019-05-08 NOTE — Progress Notes (Addendum)
Subjective:  [name] was seen and evaluated at bedside. Patient has no complaints today or O/N. We spoke about his antibiotic medications in the outpatient setting, which the patient was agreeable to the plan. All questions and concerns were addressed.   Objective:  Vital signs in last 24 hours: Vitals:   05/07/19 1237 05/07/19 1945 05/08/19 0534  BP: 113/63 121/61 123/66  Pulse: (!) 52 86 82  Resp: 16    Temp: 98.3 F (36.8 C) 99.9 F (37.7 C) 99.2 F (37.3 C)  TempSrc: Oral Oral Oral  SpO2: 98% 94% 94%   BMP Latest Ref Rng & Units 05/07/2019 03/06/2019 02/21/2018  Glucose 70 - 99 mg/dL 106(H) 95 208(H)  BUN 8 - 23 mg/dL 32(H) 25 12  Creatinine 0.61 - 1.24 mg/dL 1.58(H) 1.41(H) 1.18  BUN/Creat Ratio 10 - 24 - 18 10  Sodium 135 - 145 mmol/L 141 141 143  Potassium 3.5 - 5.1 mmol/L 4.0 4.1 3.7  Chloride 98 - 111 mmol/L 100 97 101  CO2 22 - 32 mmol/L 31 27 26   Calcium 8.9 - 10.3 mg/dL 9.0 9.5 9.1   CBC Latest Ref Rng & Units 05/07/2019 03/06/2019 09/07/2016  WBC 4.0 - 10.5 K/uL 12.6(H) 9.7 -  Hemoglobin 13.0 - 17.0 g/dL 11.8(L) 13.8 14.3  Hematocrit 39.0 - 52.0 % 38.8(L) 42.7 42.0  Platelets 150 - 400 K/uL 387 271 -   Physical Exam Vitals and nursing note reviewed.  Constitutional:      General: He is not in acute distress.    Appearance: Normal appearance. He is normal weight. He is not ill-appearing or toxic-appearing.  HENT:     Head: Normocephalic and atraumatic.  Cardiovascular:     Rate and Rhythm: Regular rhythm. Bradycardia present.     Pulses: Normal pulses.     Heart sounds: Normal heart sounds. No murmur. No friction rub. No gallop.   Pulmonary:     Effort: Pulmonary effort is normal.     Breath sounds: Normal breath sounds. No wheezing, rhonchi or rales.  Abdominal:     General: Abdomen is flat. Bowel sounds are normal.     Palpations: Abdomen is soft.     Tenderness: There is no abdominal tenderness. There is no guarding or rebound.   Musculoskeletal:        General: Tenderness present.     Comments: Tenderness to palpation in the LLE.   Skin:    General: Skin is warm.     Comments: LLE well wrapped, skin is warm to the touch and his foot is perfused.   Neurological:     General: No focal deficit present.     Mental Status: He is alert and oriented to person, place, and time.  Psychiatric:        Behavior: Behavior normal.      EXAM: LEFT TIBIA AND FIBULA - 2 VIEW  COMPARISON:  None  FINDINGS: Diffuse soft tissue swelling.  Mild osseous demineralization.  Joint spaces preserved.  Patellar spurs at quadriceps and patellar tendon insertions.  No acute fracture, dislocation, or bone destruction.  IMPRESSION: Diffuse soft tissue swelling without acute bony abnormalities. Assessment/Plan:  Principal Problem:   Cellulitis Active Problems:   HTN (hypertension)   Onychomycosis   Chronic venous insufficiency Daniel [name] is a 78 y/o male, with PMH of arthritis, HTN, and chronic renal insufficiency, who presents to the [facility] with non purulent cellulitis of the LLE. Non-purulent cellulitis Chronic venous insufficiency  -Leukocytosis: WBC 10.3 - Continue  ceftriaxone, transition to oral antibiotic course on discharge.  - Appreciate wound care consultation  HTN - Continue amlodipine 10 mg once daily - Continue lisinopril 20 mg once daily - Continue hydrochlorothiazide 25 mg once daily  Dispo: Anticipated discharge today.   Maudie Mercury, MD IMTS, PGY-1 Pager: [phone] 05/08/2019,7:12 AM

## 2019-05-08 NOTE — Plan of Care (Addendum)
Pt discharging to home. Discharge instructions explained to pt and pt verbalized understanding. Packed all personal belongings. No further questions or concerns voiced. Awaiting transportation.   Problem: Education: Goal: Knowledge of General Education information will improve Description: Including pain rating scale, medication(s)/side effects and non-pharmacologic comfort measures Outcome: Completed/Met   Problem: Health Behavior/Discharge Planning: Goal: Ability to manage health-related needs will improve Outcome: Completed/Met   Problem: Clinical Measurements: Goal: Ability to maintain clinical measurements within normal limits will improve Outcome: Completed/Met Goal: Will remain free from infection Outcome: Completed/Met Goal: Diagnostic test results will improve Outcome: Completed/Met Goal: Respiratory complications will improve Outcome: Completed/Met Goal: Cardiovascular complication will be avoided Outcome: Completed/Met   Problem: Activity: Goal: Risk for activity intolerance will decrease Outcome: Completed/Met   Problem: Nutrition: Goal: Adequate nutrition will be maintained Outcome: Completed/Met   Problem: Coping: Goal: Level of anxiety will decrease Outcome: Completed/Met   Problem: Elimination: Goal: Will not experience complications related to bowel motility Outcome: Completed/Met Goal: Will not experience complications related to urinary retention Outcome: Completed/Met   Problem: Pain Managment: Goal: General experience of comfort will improve Outcome: Completed/Met   Problem: Safety: Goal: Ability to remain free from injury will improve Outcome: Completed/Met   Problem: Skin Integrity: Goal: Risk for impaired skin integrity will decrease Outcome: Completed/Met

## 2019-05-08 NOTE — Discharge Instructions (Signed)
Daniel [name],  Thank you for choosing [facility] for your medical care needs. During your admission, your leg wound was dressed with bandages to promote healing and you were treated with IV antibiotics. We will be discarching you with antibiotics. Please take 1 pill 12 hours a part two times a day. You will finish five days of this medication.    Cellulitis, Adult  Cellulitis is a skin infection. The infected area is often warm, red, swollen, and sore. It occurs most often in the arms and lower legs. It is very important to get treated for this condition. What are the causes? This condition is caused by bacteria. The bacteria enter through a break in the skin, such as a cut, burn, insect bite, open sore, or crack. What increases the risk? This condition is more likely to occur in people who:  Have a weak body defense system (immune system).  Have open cuts, burns, bites, or scrapes on the skin.  Are older than 77 years of age.  Have a blood sugar problem (diabetes).  Have a long-lasting (chronic) liver disease (cirrhosis) or kidney disease.  Are very overweight (obese).  Have a skin problem, such as: ? Itchy rash (eczema). ? Slow movement of blood in the veins (venous stasis). ? Fluid buildup below the skin (edema).  Have been treated with high-energy rays (radiation).  Use IV drugs. What are the signs or symptoms? Symptoms of this condition include:  Skin that is: ? Red. ? Streaking. ? Spotting. ? Swollen. ? Sore or painful when you touch it. ? Warm.  A fever.  Chills.  Blisters. How is this diagnosed? This condition is diagnosed based on:  Medical history.  Physical exam.  Blood tests.  Imaging tests. How is this treated? Treatment for this condition may include:  Medicines to treat infections or allergies.  Home care, such as: ? Rest. ? Placing cold or warm cloths (compresses) on the skin.  Hospital care, if the condition is very bad. Follow these  instructions at home: Medicines  Take over-the-counter and prescription medicines only as told by your doctor.  If you were prescribed an antibiotic medicine, take it as told by your doctor. Do not stop taking it even if you start to feel better. General instructions   Drink enough fluid to keep your pee (urine) pale yellow.  Do not touch or rub the infected area.  Raise (elevate) the infected area above the level of your heart while you are sitting or lying down.  Place cold or warm cloths on the area as told by your doctor.  Keep all follow-up visits as told by your doctor. This is important. Contact a doctor if:  You have a fever.  You do not start to get better after 1-2 days of treatment.  Your bone or joint under the infected area starts to hurt after the skin has healed.  Your infection comes back. This can happen in the same area or another area.  You have a swollen bump in the area.  You have new symptoms.  You feel ill and have muscle aches and pains. Get help right away if:  Your symptoms get worse.  You feel very sleepy.  You throw up (vomit) or have watery poop (diarrhea) for a long time.  You see red streaks coming from the area.  Your red area gets larger.  Your red area turns dark in color. These symptoms may represent a serious problem that is an emergency. Do not  wait to see if the symptoms will go away. Get medical help right away. Call your local emergency services (911 in the [place] Do not drive yourself to the hospital. Summary  Cellulitis is a skin infection. The area is often warm, red, swollen, and sore.  This condition is treated with medicines, rest, and cold and warm cloths.  Take all medicines only as told by your doctor.  Tell your doctor if symptoms do not start to get better after 1-2 days of treatment. This information is not intended to replace advice given to you by your health care provider. Make sure you discuss any  questions you have with your health care provider. Document Released: 10/18/2007 Document Revised: 09/20/2017 Document Reviewed: 09/20/2017 Elsevier Patient Education  2020 [employer].

## 2019-05-08 NOTE — Progress Notes (Signed)
Responding to Spiritual Consult, spoke with patient's nurse to advise we have no notary in the house today who can respond to this request to notarize an AD.  Requested this Spiritual Consult for AD be re-entered at a later date when notary is present.  [facility] Chaplain Resident

## 2019-05-09 NOTE — Discharge Summary (Signed)
Name: Daniel [name] MRN: [id number] DOB: Mar 31, 1941 78 y.o. PCP: Maudie Mercury, MD  Date of Admission: 05/07/2019 12:27 PM Date of Discharge: 05/08/2019 Attending Physician: No att. providers found  Discharge Diagnosis: 1. Non Purulent Cellulitis 2/2 Chronic Venous Insufficiency 2. Hypertension  Discharge Medications: Allergies as of 05/08/2019   No Known Allergies     Medication List    TAKE these medications   amLODipine 10 MG tablet Commonly known as: NORVASC TAKE 1 TABLET BY MOUTH  DAILY   cephALEXin 500 MG capsule Commonly known as: KEFLEX Take 1 capsule (500 mg total) by mouth 2 (two) times daily for 5 days.   colchicine 0.6 MG tablet 1 tablet tid for 5 days with onset of gout symptoms   lisinopril-hydrochlorothiazide 20-25 MG tablet Commonly known as: ZESTORETIC TAKE 1 TABLET BY MOUTH  DAILY   Vitamin E 400 units Tabs Take by mouth.       Disposition and follow-up:   [name] was discharged from [facility] in Stable condition.  At the hospital follow up visit please address:  1.  Non Purulent Cellulitis 2/2 Chronic Venous Insufficiency: Continuing wound care at home, Check for resolution of symptoms at next visit.   2.  Labs / imaging needed at time of follow-up: CBC for resolution of leukocytosis  3.  Pending labs/ test needing follow-up: N/A  Follow-up Appointments: Patient follow up in 2 weeks in the [facility] acute care clinic. Has a PCP follow up on 06/15/2018  Hospital Course by problem list: 1. Non Purulent Cellulitis 2/2 Chronic Venous Insufficiency:  Patient admitted for no purulent cellulitis after self treatment of RLE and LLE wounds for 4-5 weeks with resolution of the RLE. Labs showed increased leukocytosis in the presence of lower extremity leg lacerations that were draining clear/serosanguinous fluid with erythema and tenderness to palpation. He was treated with IV ceftriaxone 2g QD and wound care was consulted.  Patient was discharged in stable condition with a 5 day course of keflex.   2. Hypertension:  Patient with hypertension was admitted. His blood pressures were controlled with amlodipine 10 mg, lisinopril 20 mg, and HCTZ 25 mg QD. He was discharged in stable condition with his home medications.   Discharge Vitals:   BP (!) 157/71 (BP Location: Right Arm)   Pulse 89   Temp 98.3 F (36.8 C) (Oral)   Resp 17   Ht 5\' 9"  (1.753 m)   Wt 105.6 kg   SpO2 96%   BMI 34.38 kg/m   Pertinent Labs, Studies, and Procedures:  CBC Latest Ref Rng & Units 05/08/2019 05/07/2019 03/06/2019  WBC 4.0 - 10.5 K/uL 10.3 12.6(H) 9.7  Hemoglobin 13.0 - 17.0 g/dL 11.8(L) 11.8(L) 13.8  Hematocrit 39.0 - 52.0 % 38.9(L) 38.8(L) 42.7  Platelets 150 - 400 K/uL 324 387 271   CLINICAL DATA:  LEFT lower leg wounds anteriorly, cellulitis  EXAM: LEFT TIBIA AND FIBULA - 2 VIEW  COMPARISON:  None  FINDINGS: Diffuse soft tissue swelling.  Mild osseous demineralization.  Joint spaces preserved.  Patellar spurs at quadriceps and patellar tendon insertions.  No acute fracture, dislocation, or bone destruction.  IMPRESSION: Diffuse soft tissue swelling without acute bony abnormalities.    Discharge Instructions: Discharge Instructions    Call MD for:  difficulty breathing, headache or visual disturbances   Complete by: As directed    Call MD for:  persistant nausea and vomiting   Complete by: As directed    Call MD for:  redness, tenderness, or signs of infection (pain, swelling, redness, odor or green/yellow discharge around incision site)   Complete by: As directed    Call MD for:  severe uncontrolled pain   Complete by: As directed    Call MD for:  temperature >100.4   Complete by: As directed    Diet - low sodium heart healthy   Complete by: As directed    Increase activity slowly   Complete by: As directed       Signed: Maudie Mercury, MD 05/09/2019, 9:56 AM   Pager: [phone]

## 2019-05-11 NOTE — Progress Notes (Signed)
Internal Medicine Clinic Attending  Case discussed with Dr. Santos-Sanchez at the time of the visit.  We reviewed the resident's history and exam and pertinent patient test results.  I agree with the assessment, diagnosis, and plan of care documented in the resident's note.    

## 2019-06-16 ENCOUNTER — Ambulatory Visit (INDEPENDENT_AMBULATORY_CARE_PROVIDER_SITE_OTHER): Payer: Medicare Other | Admitting: Internal Medicine

## 2019-06-16 ENCOUNTER — Other Ambulatory Visit: Payer: Self-pay

## 2019-06-16 ENCOUNTER — Encounter: Payer: Self-pay | Admitting: Internal Medicine

## 2019-06-16 VITALS — BP 123/57 | HR 90 | Temp 99.1°F | Ht 69.0 in | Wt 235.2 lb

## 2019-06-16 DIAGNOSIS — L03116 Cellulitis of left lower limb: Secondary | ICD-10-CM

## 2019-06-16 DIAGNOSIS — I10 Essential (primary) hypertension: Secondary | ICD-10-CM | POA: Diagnosis not present

## 2019-06-16 DIAGNOSIS — L02416 Cutaneous abscess of left lower limb: Secondary | ICD-10-CM | POA: Diagnosis not present

## 2019-06-16 DIAGNOSIS — Z79899 Other long term (current) drug therapy: Secondary | ICD-10-CM | POA: Diagnosis not present

## 2019-06-16 NOTE — Assessment & Plan Note (Signed)
Patient arrives with recent hospitalization in December 2020 for L lower extremity cellulitis. Patient has completed course of antibiotics, with confirmation that his erythema and pain have resolved. Patient does have complaints of an isolated calf pain that is described as shock like in quality with no radiation. He states that it is relieved by walking and aggravated by prolonged sitting. We discussed the possibility of his lower extremity edema on top of the inflammation processes from his previous hospitalization. Patient instructed to continue wearing compression stockings, and to monitor symptoms. His amlodipine was held to observe if reduction in swelling would alleviate this sensation. Will order CBC to r/o reinfection. While resistant cellulitis, given that he has had overall relief from his symptoms, with a clear physical examination, it appears less likely.  - Will order CBC.  - Continue to monitor leg pain for alleviation after holding amlodipine.

## 2019-06-16 NOTE — Assessment & Plan Note (Addendum)
Patient presents to the office for follow up on his hypertension. He has been well controlled on his current regiment of amlodipine and Zestoretic. He has severe 3+ pitting edema with a known history of chronic venous insufficiency, but is currently on amlodipine 10 mg QD. Due to severe edema and recent left lower extremity cellulitis, amlodipine will be held until his next visit to observe if there is improvement in his edema.  Plan:  - Hold amlodipine at this time.  - Continue Zestoretic - Will monitor BP on next visit, patient instructed to continue monitoring home pressures. Will see if pitting edema is alleviated with elimination of amlodipine.   - BMP to monitor kidney function in setting of HTN.

## 2019-06-16 NOTE — Patient Instructions (Addendum)
To Mr. Drath,  It was a pleasure working with you today. Today we discussed your lower leg swelling (edema), hypertension, and foot care. Please do not use your amlodipine (Norvasc) for 4-6 weeks to see if your swelling improves and to reassess your blood pressure.  I look forward to seeing you again! Sincerely,  Maudie Mercury

## 2019-06-16 NOTE — Progress Notes (Signed)
   CC: Cellulitis Follow up/Hypertension/  HPI:  Daniel [name] is a 79 y.o., with a PMH, appreciated below, who presents to the clinic for a follow up on his recent hospitalization and hypertension. To see the management of his acute and chronic conditions, please see the separate assessment and plan note.   Past Medical History:  Diagnosis Date  . Allergy   . Arthritis   . Chronic renal insufficiency   . Glaucoma   . Hypertension    Review of Systems:   Review of Systems  Constitutional: Negative for chills, diaphoresis and fever.  Eyes: Negative for blurred vision, double vision and photophobia.  Respiratory: Negative for cough and shortness of breath.   Cardiovascular: Negative for chest pain.  Gastrointestinal: Negative for abdominal pain, constipation, diarrhea and vomiting.  Musculoskeletal: Negative for back pain and joint pain.    Physical Exam: Physical Exam Vitals reviewed.  Constitutional:      General: He is not in acute distress.    Appearance: Normal appearance.  HENT:     Head: Normocephalic and atraumatic.     Nose: Nose normal.  Eyes:     Conjunctiva/sclera: Conjunctivae normal.     Pupils: Pupils are equal, round, and reactive to light.  Cardiovascular:     Rate and Rhythm: Normal rate and regular rhythm.     Pulses: Normal pulses.     Heart sounds: Normal heart sounds. No murmur. No friction rub. No gallop.   Pulmonary:     Effort: Pulmonary effort is normal.     Breath sounds: No wheezing.  Abdominal:     General: Abdomen is flat. Bowel sounds are normal.     Palpations: Abdomen is soft.     Tenderness: There is no abdominal tenderness. There is no guarding.  Musculoskeletal:        General: Swelling present.     Comments: 2+ Pitting edema bilaterally in the lower extremities.   Skin:    General: Skin is warm and dry.     Findings: No bruising or erythema.  Neurological:     Mental Status: He is alert and oriented to person, place, and  time.  Psychiatric:        Mood and Affect: Mood normal.        Behavior: Behavior normal.     Vitals:   06/16/19 1322  BP: (!) 123/57  Pulse: 90  Temp: 99.1 F (37.3 C)  TempSrc: Oral  SpO2: 97%  Weight: 235 lb 3.2 oz (106.7 kg)  Height: 5\' 9"  (1.753 m)    Assessment & Plan:   See Encounters Tab for problem based charting.  Patient discussed with Dr. Heber Kingston

## 2019-06-17 LAB — BMP8+ANION GAP
Anion Gap: 15 mmol/L (ref 10.0–18.0)
BUN/Creatinine Ratio: 10 (ref 10–24)
BUN: 11 mg/dL (ref 8–27)
CO2: 26 mmol/L (ref 20–29)
Calcium: 8.6 mg/dL (ref 8.6–10.2)
Chloride: 101 mmol/L (ref 96–106)
Creatinine, Ser: 1.08 mg/dL (ref 0.76–1.27)
GFR calc Af Amer: 76 mL/min/{1.73_m2} (ref >59)
GFR calc non Af Amer: 66 mL/min/{1.73_m2} (ref >59)
Glucose: 76 mg/dL (ref 65–99)
Potassium: 4.5 mmol/L (ref 3.5–5.2)
Sodium: 142 mmol/L (ref 134–144)

## 2019-06-17 LAB — CBC
Hematocrit: 38.7 % (ref 37.5–51.0)
Hemoglobin: 12.2 g/dL — ABNORMAL LOW (ref 13.0–17.7)
MCH: 26.1 pg — ABNORMAL LOW (ref 26.6–33.0)
MCHC: 31.5 g/dL (ref 31.5–35.7)
MCV: 83 fL (ref 79–97)
Platelets: 239 10*3/uL (ref 150–450)
RBC: 4.67 x10E6/uL (ref 4.14–5.80)
RDW: 16.1 % — ABNORMAL HIGH (ref 11.6–15.4)
WBC: 9 10*3/uL (ref 3.4–10.8)

## 2019-06-22 NOTE — Progress Notes (Signed)
Internal Medicine Clinic Attending  Case discussed with Dr. Winters at the time of the visit.  We reviewed the resident's history and exam and pertinent patient test results.  I agree with the assessment, diagnosis, and plan of care documented in the resident's note.  

## 2019-06-27 ENCOUNTER — Telehealth: Payer: Self-pay | Admitting: Internal Medicine

## 2019-06-27 ENCOUNTER — Other Ambulatory Visit: Payer: Self-pay

## 2019-06-27 ENCOUNTER — Ambulatory Visit (INDEPENDENT_AMBULATORY_CARE_PROVIDER_SITE_OTHER): Payer: Medicare Other | Admitting: Podiatry

## 2019-06-27 ENCOUNTER — Encounter: Payer: Self-pay | Admitting: Podiatry

## 2019-06-27 DIAGNOSIS — B351 Tinea unguium: Secondary | ICD-10-CM

## 2019-06-27 DIAGNOSIS — M79674 Pain in right toe(s): Secondary | ICD-10-CM

## 2019-06-27 DIAGNOSIS — M79675 Pain in left toe(s): Secondary | ICD-10-CM | POA: Diagnosis not present

## 2019-06-27 DIAGNOSIS — R600 Localized edema: Secondary | ICD-10-CM

## 2019-06-27 NOTE — Telephone Encounter (Signed)
Pt daughter is calling about pt leg, pt contact [phone]

## 2019-06-27 NOTE — Progress Notes (Signed)
Complaint:  Visit Type: Patient returns to my office for continued preventative foot care services. Complaint: Patient states" my nails have grown long and thick and become painful to walk and wear shoes" Patient has been diagnosed with DM with no foot complications. The patient presents for preventative foot care services.  Patient presents to the office with his daughter.  Podiatric Exam: Vascular: dorsalis pedis  are weakly palpable bilateral. Posterior tibial pulses are absent  B/L. Capillary return is immediate. Temperature gradient is WNL. Skin turgor WNL  Sensorium: Normal Semmes Weinstein monofilament test. Normal tactile sensation bilaterally. Nail Exam: Pt has thick disfigured discolored nails with subungual debris noted bilateral entire nail hallux through fifth toenails Ulcer Exam: There is no evidence of ulcer or pre-ulcerative changes or infection. Orthopedic Exam: Muscle tone and strength are WNL. No limitations in general ROM. No crepitus or effusions noted. Foot type and digits show no abnormalities. Bony prominences are unremarkable. Skin: No Porokeratosis. No infection or ulcers  Diagnosis:  Onychomycosis, , Pain in right toe, pain in left toes  Treatment & Plan Procedures and Treatment: Consent by patient was obtained for treatment procedures.   Debridement of mycotic and hypertrophic toenails, 1 through 5 bilateral and clearing of subungual debris. No ulceration, no infection noted.  Return Visit-Office Procedure: Patient instructed to return to the office for a follow up visit 3 months for continued evaluation and treatment.    Janeli Lewison DPM 

## 2019-06-27 NOTE — Telephone Encounter (Signed)
Pt's daughter calls and states she does not know what to do for pt's leg, should she continue with the dressing wraps? Exactly what? She would like either a call from dr winters or instructions that she can follow, you may reach her at [phone]

## 2019-06-29 ENCOUNTER — Ambulatory Visit: Payer: Medicare Other | Attending: Internal Medicine

## 2019-06-29 DIAGNOSIS — Z23 Encounter for immunization: Secondary | ICD-10-CM | POA: Insufficient documentation

## 2019-06-29 NOTE — Progress Notes (Signed)
   Covid-19 Vaccination Clinic  Name:  Daniel [name]    MRN: [id number] DOB: 05-01-1941  06/29/2019  Daniel [name] was observed post Covid-19 immunization for 15 minutes without incidence. He was provided with Vaccine Information Sheet and instruction to access the V-Safe system.   Daniel [name] was instructed to call 911 with any severe reactions post vaccine: Marland Kitchen Difficulty breathing  . Swelling of your face and throat  . A fast heartbeat  . A bad rash all over your body  . Dizziness and weakness    Immunizations Administered    Name Date Dose VIS Date Route   Pfizer COVID-19 Vaccine 06/29/2019  2:51 PM 0.3 mL 04/25/2019 Intramuscular   Manufacturer: [place]   Lot: [id number]   [place]: [id number]

## 2019-07-09 NOTE — Telephone Encounter (Signed)
To all,  Spoke with Mr. Errico family on 06/29/19 concerning dressing wraps. We spoke about unwrapping his dressings when his wound has closed. Family voiced understanding and stated that wound had closed. They will take take off the wrappings at this time.  Sincerely,  Maudie Mercury, MD

## 2019-07-22 ENCOUNTER — Ambulatory Visit: Payer: Medicare Other | Attending: Internal Medicine

## 2019-07-22 DIAGNOSIS — Z23 Encounter for immunization: Secondary | ICD-10-CM

## 2019-07-22 NOTE — Progress Notes (Signed)
   Covid-19 Vaccination Clinic  Name:  [name]    MRN: [id number] DOB: [birth year]  07/22/2019  [name] was observed post Covid-19 immunization for 15 minutes without incident. He was provided with Vaccine Information Sheet and instruction to access the V-Safe system.   [name] was instructed to call 911 with any severe reactions post vaccine: Marland Kitchen Difficulty breathing  . Swelling of face and throat  . A fast heartbeat  . A bad rash all over body  . Dizziness and weakness   Immunizations Administered    Name Date Dose VIS Date Route   Pfizer COVID-19 Vaccine 07/22/2019  5:38 PM 0.3 mL 04/25/2019 Intramuscular   Manufacturer: [place]   Lot: [id number]   [place]: [id number]

## 2019-07-23 ENCOUNTER — Ambulatory Visit: Payer: Medicare Other

## 2019-07-28 ENCOUNTER — Other Ambulatory Visit: Payer: Self-pay

## 2019-07-28 ENCOUNTER — Encounter: Payer: Self-pay | Admitting: Internal Medicine

## 2019-07-28 ENCOUNTER — Ambulatory Visit (INDEPENDENT_AMBULATORY_CARE_PROVIDER_SITE_OTHER): Payer: Medicare Other | Admitting: Internal Medicine

## 2019-07-28 VITALS — BP 140/73 | HR 78 | Temp 98.0°F | Ht 69.0 in | Wt 243.5 lb

## 2019-07-28 DIAGNOSIS — I10 Essential (primary) hypertension: Secondary | ICD-10-CM

## 2019-07-28 DIAGNOSIS — R599 Enlarged lymph nodes, unspecified: Secondary | ICD-10-CM | POA: Diagnosis not present

## 2019-07-28 DIAGNOSIS — R590 Localized enlarged lymph nodes: Secondary | ICD-10-CM | POA: Diagnosis not present

## 2019-07-28 DIAGNOSIS — K029 Dental caries, unspecified: Secondary | ICD-10-CM

## 2019-07-28 DIAGNOSIS — Z79899 Other long term (current) drug therapy: Secondary | ICD-10-CM

## 2019-07-28 DIAGNOSIS — Z87891 Personal history of nicotine dependence: Secondary | ICD-10-CM | POA: Diagnosis not present

## 2019-07-28 NOTE — Progress Notes (Signed)
   CC: Hypertension and Swollen Glands HPI:  [name] is a 79 y.o. male, with a PMH noted below, who presents to the clinic for a follow up on his blood pressure and swollen glands. To see the management of his acute and chronic conditions, please see the assessment and plan under the encounters tab.   Past Medical History:  Diagnosis Date  . Allergy   . Arthritis   . Chronic renal insufficiency   . Glaucoma   . Hypertension    Review of Systems:   Review of Systems  Constitutional: Negative for chills, fever, malaise/fatigue and weight loss.  HENT: Negative for congestion, ear discharge, ear pain, sinus pain and sore throat.   Eyes: Negative for blurred vision.  Respiratory: Negative for cough, sputum production, shortness of breath and wheezing.   Cardiovascular: Negative for chest pain, palpitations and orthopnea.  Gastrointestinal: Negative for abdominal pain, constipation, diarrhea and vomiting.  Skin: Negative for itching and rash.  Neurological: Negative for tingling, tremors, sensory change and headaches.     Physical Exam:  Vitals:   07/28/19 1551 07/28/19 1619  BP: (!) 150/73 140/73  Pulse: 82 78  Temp: 98 F (36.7 C)   TempSrc: Oral   SpO2: 97%   Weight: 243 lb 8 oz (110.5 kg)   Height: 5\' 9"  (1.753 m)    Physical Exam Constitutional:      Appearance: Normal appearance.  HENT:     Head: Normocephalic and atraumatic.     Right Ear: Tympanic membrane normal.     Left Ear: Tympanic membrane normal.     Nose: Nose normal.     Mouth/Throat:     Mouth: Mucous membranes are moist.     Pharynx: No oropharyngeal exudate or posterior oropharyngeal erythema.     Comments: Dental carries appreciated in the upper and lower molars bilaterally.  Eyes:     Extraocular Movements: Extraocular movements intact.     Conjunctiva/sclera: Conjunctivae normal.     Pupils: Pupils are equal, round, and reactive to light.  Neck:     Comments: R well circumscribed,  immobile >1 cm lymph node in the R superficial cervical chain with an additional well circumscribed lesion noted in the L submaxillary ~1 cm in length.  Pulmonary:     Effort: Pulmonary effort is normal.     Breath sounds: Normal breath sounds. No wheezing, rhonchi or rales.  Musculoskeletal:        General: No tenderness.     Right lower leg: No edema.     Left lower leg: No edema.     Comments: Chronic skin changes bilaterally.   Lymphadenopathy:     Cervical: Cervical adenopathy present.  Neurological:     Mental Status: He is alert and oriented to person, place, and time.  Psychiatric:        Mood and Affect: Mood normal.        Behavior: Behavior normal.     Assessment & Plan:   See Encounters Tab for problem based charting.  Patient seen with Dr. Rebeca Alert

## 2019-07-28 NOTE — Assessment & Plan Note (Signed)
Patient with PMH of HTN, currently on Zestoretic 20-25 mg presents to the [facility] with a BP of 150/73 with 140/73 on repeat measurement. Patient states that he monitors her pressure at home and it typically runs in the mid 130s/70s. Since patient has been off his Norvasc, he has noticed a decrease in his lower extremity swelling.   Plan:  - Continue Zestoretic 20-25mg  - If pressure increases on subsequent visits please consider additional lisinopril dosage.  - Patient instructed to keep taking home vitals.

## 2019-07-28 NOTE — Patient Instructions (Signed)
To Daniel [name],  It was a pleasure seeing you today. Today we discussed your hypertension and swollen glands. Today we will draw blood to rule out infection of your glands. We will also get imaging of your swollen gland. Additionally, continue to take your hypertension medications. We will have you scheduled for a follow up in 2 months. Have a good day!  Sincerely,  Daniel Mercury, MD

## 2019-07-28 NOTE — Assessment & Plan Note (Signed)
Hx:  Patient states that he noticed a swollen gland 3-4 days ago while feeling his throat. Patient denies recent infections, sore throat, diaphoresis, weight loss, or pain when he touches the affected gland.   Assessment:  Patient with a PMH of tobacco use presents to the clinic with a well circumscribed, hard, immobile >1cm lymph node in the R superficial cervical chain with an additional lesion noted in the L submaxillary region on physical examination. Given the patient has denied recent infections, and the findings on physical examination with a Hx of tobacco use, imaging is warranted at this time.   Plan:  - U/S Neck ordered - CBC ordered

## 2019-07-29 LAB — CBC WITH DIFFERENTIAL/PLATELET
Basophils Absolute: 0 10*3/uL (ref 0.0–0.2)
Basos: 1 %
EOS (ABSOLUTE): 0.2 10*3/uL (ref 0.0–0.4)
Eos: 2 %
Hematocrit: 42.3 % (ref 37.5–51.0)
Hemoglobin: 13.4 g/dL (ref 13.0–17.7)
Immature Grans (Abs): 0 10*3/uL (ref 0.0–0.1)
Immature Granulocytes: 0 %
Lymphocytes Absolute: 2.2 10*3/uL (ref 0.7–3.1)
Lymphs: 26 %
MCH: 26.9 pg (ref 26.6–33.0)
MCHC: 31.7 g/dL (ref 31.5–35.7)
MCV: 85 fL (ref 79–97)
Monocytes Absolute: 0.8 10*3/uL (ref 0.1–0.9)
Monocytes: 9 %
Neutrophils Absolute: 5.3 10*3/uL (ref 1.4–7.0)
Neutrophils: 62 %
Platelets: 238 10*3/uL (ref 150–450)
RBC: 4.99 x10E6/uL (ref 4.14–5.80)
RDW: 15.9 % — ABNORMAL HIGH (ref 11.6–15.4)
WBC: 8.4 10*3/uL (ref 3.4–10.8)

## 2019-07-29 NOTE — Progress Notes (Signed)
Internal Medicine Clinic Attending  I saw and evaluated the patient.  I personally confirmed the key portions of the history and exam documented by Dr. Gilford Rile and I reviewed pertinent patient test results.  The assessment, diagnosis, and plan were formulated together and I agree with the documentation in the resident's note.  Large, fixed, non-tender mass in neck just to the right of midline below submandibular node region, also palpable nodule in the region of the left tonsillar nodes. Given the size and immobility with no clear explanation, warrants evaluation with imaging. Will start with [place], but may ultimately need CT.   Lenice Pressman, M.D., Ph.D.

## 2019-07-31 NOTE — Addendum Note (Signed)
Addended by: Quetzali Heinle C on: 07/31/2019 12:48 AM   Modules accepted: Level of Service  

## 2019-08-06 ENCOUNTER — Other Ambulatory Visit: Payer: Self-pay | Admitting: Internal Medicine

## 2019-08-06 DIAGNOSIS — I10 Essential (primary) hypertension: Secondary | ICD-10-CM

## 2019-08-06 DIAGNOSIS — I517 Cardiomegaly: Secondary | ICD-10-CM

## 2019-08-07 ENCOUNTER — Ambulatory Visit (HOSPITAL_COMMUNITY): Payer: Medicare Other

## 2019-08-13 ENCOUNTER — Ambulatory Visit (HOSPITAL_COMMUNITY)
Admission: RE | Admit: 2019-08-13 | Discharge: 2019-08-13 | Disposition: A | Discharge status: Home / Self Care | Payer: Medicare Other | Source: Ambulatory Visit | Attending: Student in an Organized Health Care Education/Training Program | Admitting: Student in an Organized Health Care Education/Training Program

## 2019-08-13 ENCOUNTER — Other Ambulatory Visit: Payer: Self-pay

## 2019-08-13 DIAGNOSIS — R599 Enlarged lymph nodes, unspecified: Secondary | ICD-10-CM | POA: Insufficient documentation

## 2019-08-13 IMAGING — US US SOFT TISSUE HEAD/NECK
1 series · 14 of 21 positions shown · non-contrast
Comparison: None recent

CLINICAL DATA: Lymphadenopathy. Large a mobile lymph node in the
right cervical chain.

EXAM:
ULTRASOUND OF HEAD/NECK SOFT TISSUES
TECHNIQUE: Ultrasound examination of the head and neck soft tissues was
performed in the area of clinical concern.

[Series 1: us soft tissue head/neck · 21 acquisitions, 14 frames shown]
[im 1/21]
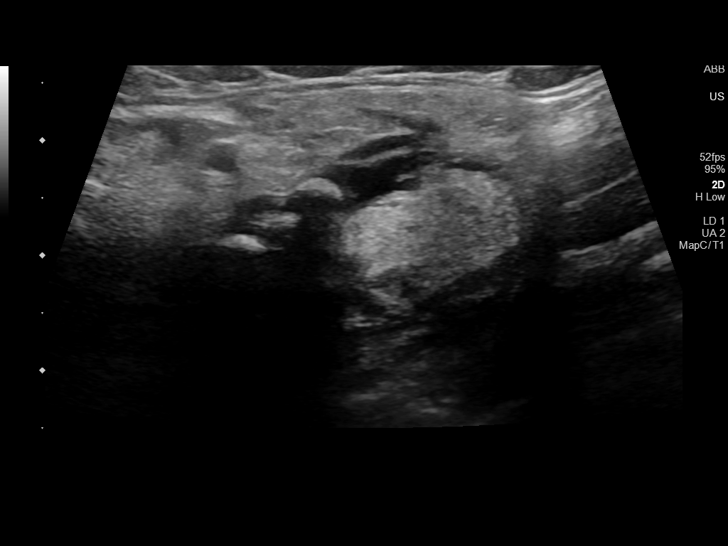
[im 3/21]
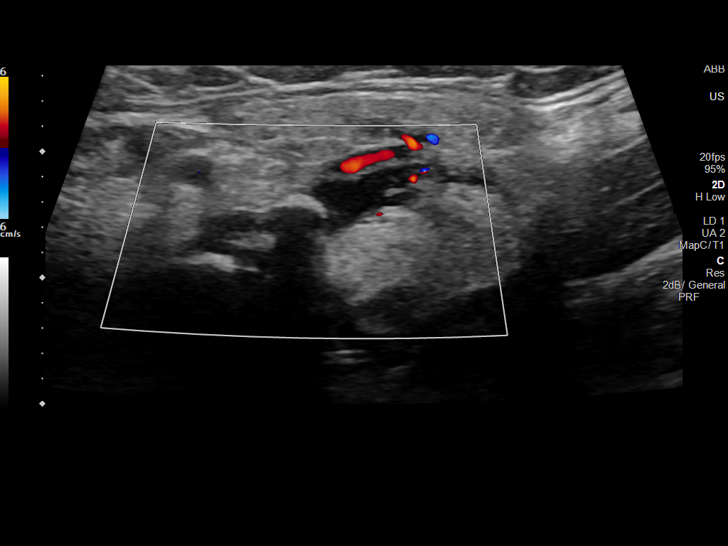
[im 4/21]
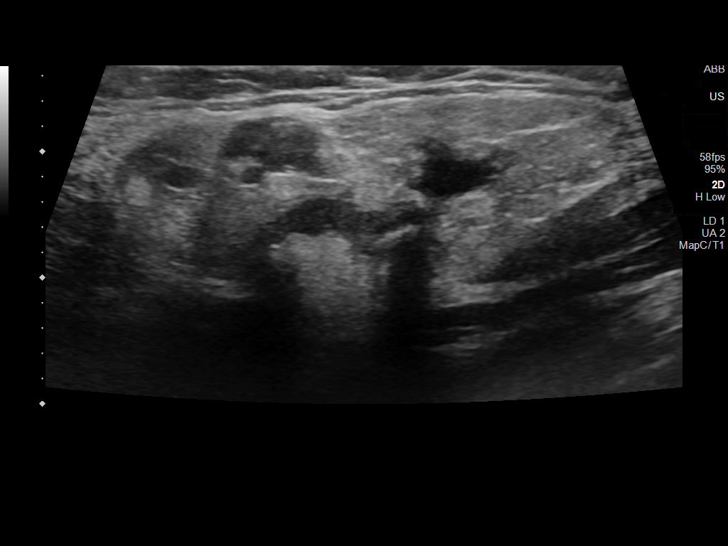
[im 6/21]
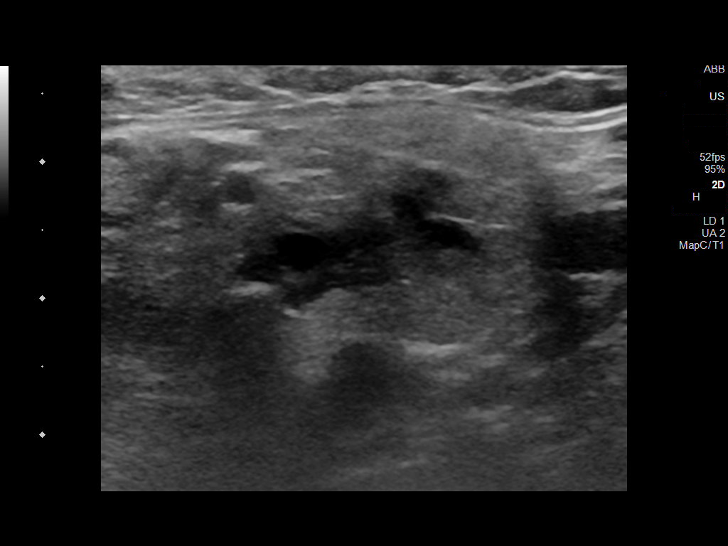
[im 7/21]
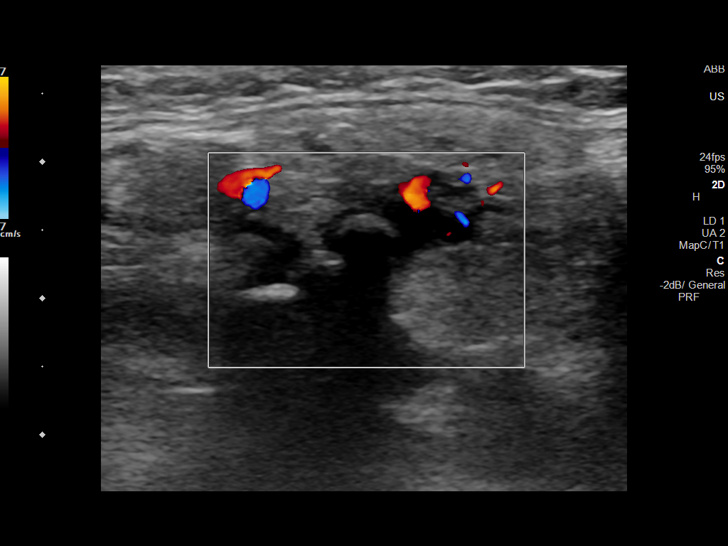
[im 9/21]
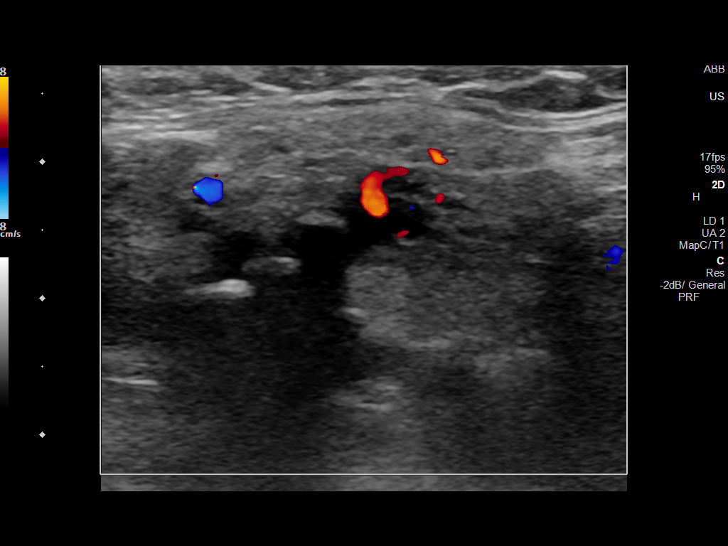
[im 10/21]
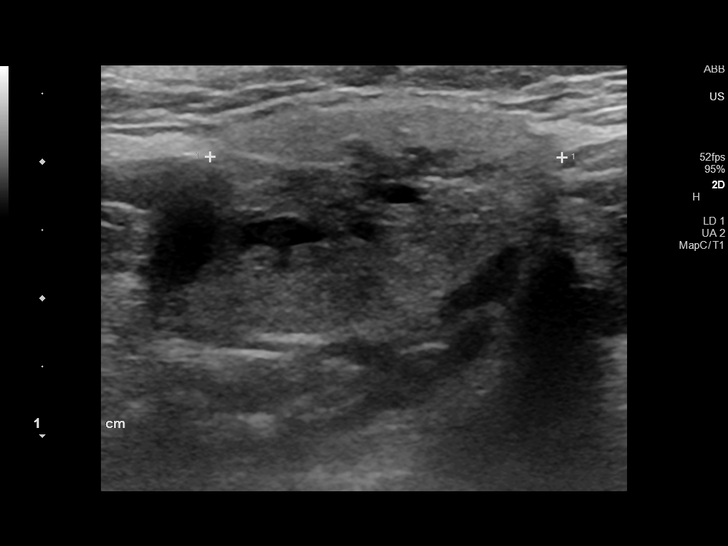
[im 12/21]
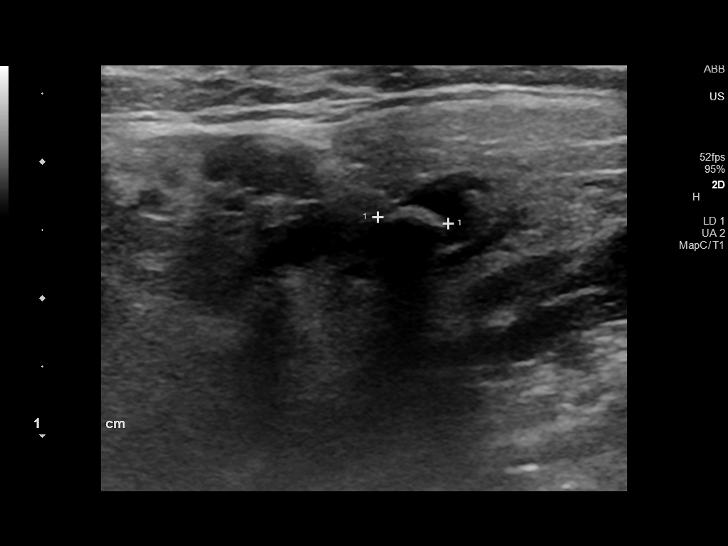
[im 13/21]
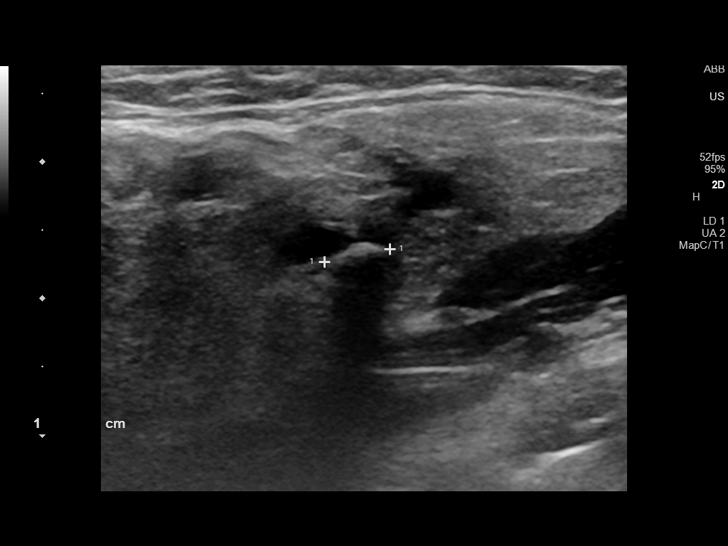
[im 15/21]
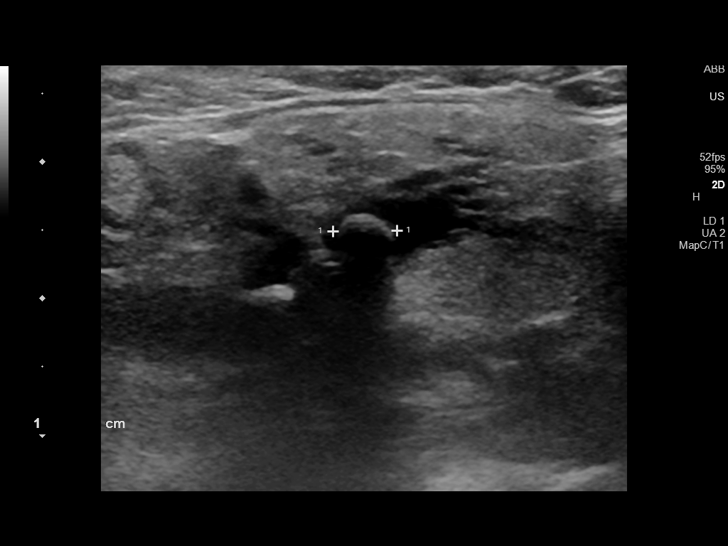
[im 16/21]
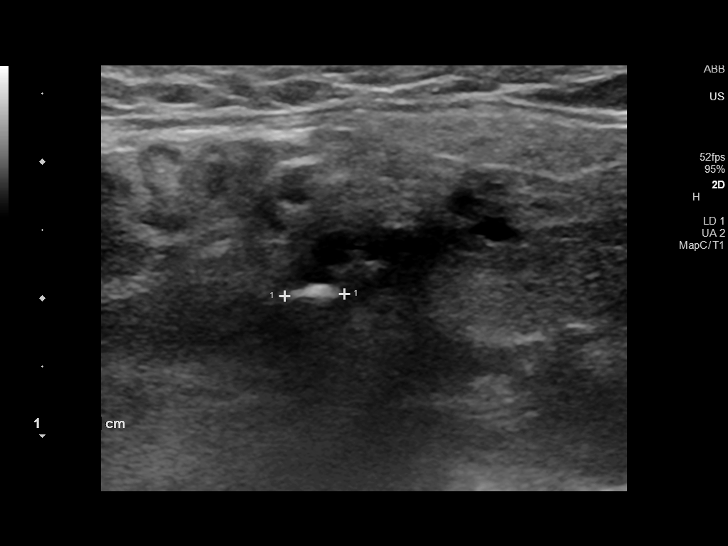
[im 18/21]
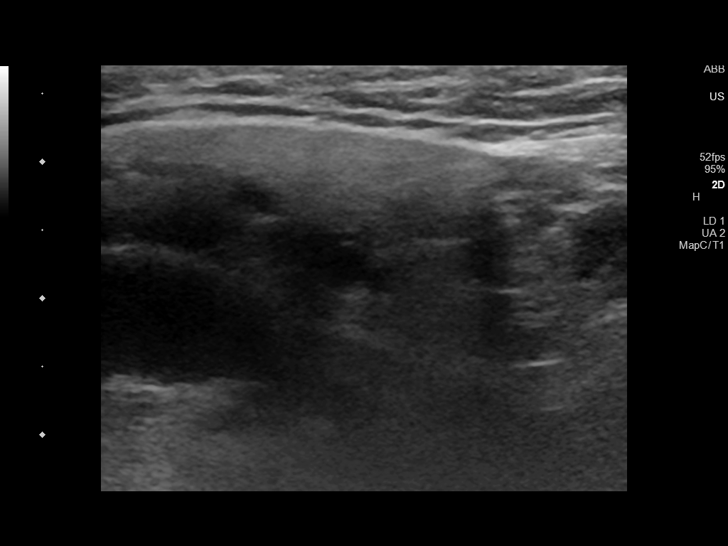
[im 19/21]
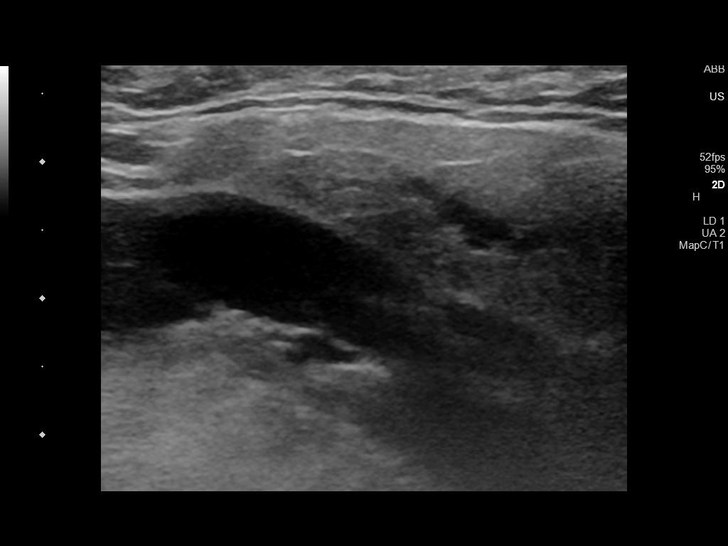
[im 21/21]
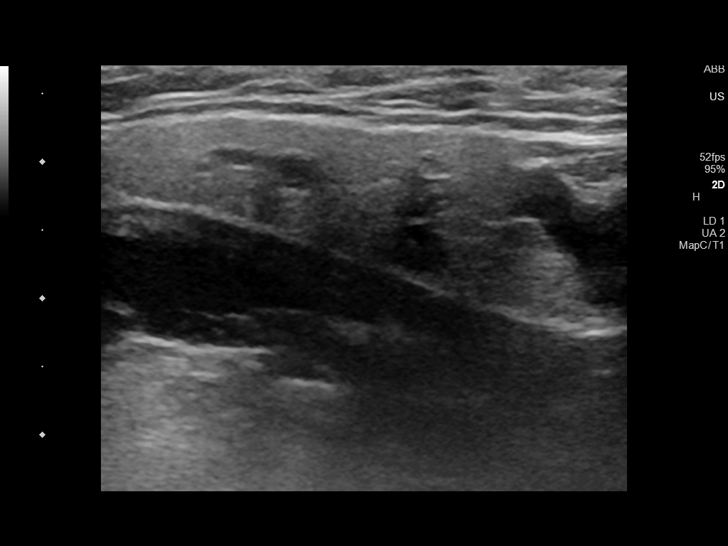

[14 of 21 positions shown; findings below may reference images not displayed]

FINDINGS: In the patient's right submandibular region, the right submandibular
gland is noted. Within the right submandibular gland, there appear
to be at least 3 shadowing stones. The submandibular ducts are
dilated. There is no concerning lymph node identified on this study.
The submandibular gland itself is somewhat enlarged.
IMPRESSION: Sonographic findings are most consistent with sialolithiasis of the
right submandibular gland with at least three calcified stones
within Wharton's duct.

## 2019-10-03 ENCOUNTER — Ambulatory Visit: Payer: Medicare Other | Admitting: Podiatry

## 2019-12-01 ENCOUNTER — Encounter: Payer: Medicare Other | Admitting: Internal Medicine

## 2019-12-05 ENCOUNTER — Ambulatory Visit (INDEPENDENT_AMBULATORY_CARE_PROVIDER_SITE_OTHER): Payer: Medicare Other | Admitting: Internal Medicine

## 2019-12-05 ENCOUNTER — Encounter: Payer: Self-pay | Admitting: Internal Medicine

## 2019-12-05 ENCOUNTER — Other Ambulatory Visit: Payer: Self-pay

## 2019-12-05 VITALS — BP 142/77 | HR 68 | Temp 98.1°F | Ht 69.0 in | Wt 230.8 lb

## 2019-12-05 DIAGNOSIS — R202 Paresthesia of skin: Secondary | ICD-10-CM | POA: Diagnosis not present

## 2019-12-05 DIAGNOSIS — I10 Essential (primary) hypertension: Secondary | ICD-10-CM

## 2019-12-05 DIAGNOSIS — I517 Cardiomegaly: Secondary | ICD-10-CM

## 2019-12-05 DIAGNOSIS — I872 Venous insufficiency (chronic) (peripheral): Secondary | ICD-10-CM

## 2019-12-05 DIAGNOSIS — I739 Peripheral vascular disease, unspecified: Secondary | ICD-10-CM | POA: Diagnosis not present

## 2019-12-05 DIAGNOSIS — B351 Tinea unguium: Secondary | ICD-10-CM | POA: Diagnosis not present

## 2019-12-05 LAB — GLUCOSE, CAPILLARY: Glucose-Capillary: 89 mg/dL (ref 70–99)

## 2019-12-05 LAB — POCT GLYCOSYLATED HEMOGLOBIN (HGB A1C): Hemoglobin A1C: 6.2 % — AB (ref 4.0–5.6)

## 2019-12-05 MED ORDER — LISINOPRIL-HYDROCHLOROTHIAZIDE 20-25 MG PO TABS: 2.0000 | ORAL_TABLET | Freq: Every day | ORAL | 3 refills | Status: DC

## 2019-12-05 NOTE — Patient Instructions (Addendum)
[name],  It was a pleasure seeing you in clinic. Today we discussed:   Peripheral artery disease:  I will send referral to vascular surgeon for further discussion. I am also checking labs to evaluate for any diabetes or cholesterol and will send in any medications if needed. I will call you with any abnormal results.   Hypertension: Please increase to 2 tablets per day. We will follow up on this in 4 weeks.   If you have any questions or concerns, please call our clinic at [phone] between 9am-5pm and after hours call [phone] and ask for the internal medicine resident on call. If you feel you are having a medical emergency please call 911.   Thank you, we look forward to helping you remain healthy!

## 2019-12-05 NOTE — Assessment & Plan Note (Signed)
Patient endorses bilateral lower extremity swelling with left > right. He also endorses paresthesias on the left lower extremity. He denies any leg pain or claudication. Denies smoking history. He is able to walk with support. On examination, he does have significant bilateral lower extremity swelling consistent with venous insufficiency. However, sensation is diminished on the left lower extremity. Pulses faintly palpable bilaterally due to significant edema.  ABIs obtained in the office demonstrating left lower extremity PAD.  Plan: Referral to vascular surgery Continue aspirin 81mg  daily  Optimize hypertension with goal BP <130/80 Lipid panel, and if elevated, will require initiation of moderate to high intensity statin as tolerated HbA1c

## 2019-12-05 NOTE — Progress Notes (Signed)
   CC: bilateral lower extremity swelling  HPI:  [name] is a 79 y.o. male with history of hypertension and chronic venous insufficiency presenting for ongoing bilateral lower extremity swelling. No fevers/chills, lesions or claudication but does endorse paresthesias on left lower extremity and intermittent paresthesias of bilateral hands. Please see problem based charting for complete assessment and plan.  Past Medical History:  Diagnosis Date  . Allergy   . Arthritis   . Chronic renal insufficiency   . Glaucoma   . Hypertension    Review of Systems:  Negative except as stated in HPI.  Physical Exam:  Vitals:   12/05/19 1049  BP: (!) 158/79  Pulse: 74  Temp: 98.1 F (36.7 C)  TempSrc: Oral  SpO2: 98%  Weight: (!) 230 lb 12.8 oz (104.7 kg)  Height: 5\' 9"  (1.753 m)   Physical Exam  Constitutional: Appears well-developed and well-nourished. No distress.  HENT: Normocephalic and atraumatic, EOMI, conjunctiva normal, moist mucous membranes Cardiovascular: Normal rate, regular rhythm, S1 and S2 present, no murmurs, rubs, gallops.  Distal pulses faintly palpable given extent of lower extremity edema  Respiratory: No respiratory distress, no accessory muscle use.  Effort is normal.  Lungs are clear to auscultation bilaterally. GI: Nondistended, soft, nontender to palpation, normal active bowel sounds Musculoskeletal: Normal bulk and tone. 3+ bilateral lower extremity nonpitting edema  Neurological: Is alert and oriented x4, motor strength 5/5 in all extremities; diminished sensation to light touch in LLE compared to RLE Skin: Warm and dry.  No rash, erythema, lesions noted. Skin changes consistent with chronic venous insufficiency Psychiatric: Normal mood and affect. Behavior is normal. Judgment and thought content normal.    Assessment & Plan:   See Encounters Tab for problem based charting.  Patient discussed with Dr. Dareen Piano

## 2019-12-05 NOTE — Assessment & Plan Note (Addendum)
Patient with history of hypertension on lisinopril-HCTZ 20-25mg  daily. Previously on amlodipine 10mg  as well which was discontinued in setting of lower extremity edema. Patient endorses some improvement of his edema since discontinuation. He does monitor his blood pressure at home and notes systolic is usually in 607P. Denies any chest pain, shortness of breath, headaches, lightheadedness/dizziness, or focal weakness.  BP Readings from Last 3 Encounters:  12/05/19 (!) 142/77  07/28/19 140/73  06/16/19 (!) 123/57   Plan: - Increase Zestoretic 20-25mg  to bid - Goal SBP <130/80  - F/u in 4 weeks for BP and BMP check

## 2019-12-05 NOTE — Assessment & Plan Note (Signed)
Patient has appointment with podiatry for foot care on 8/4.

## 2019-12-05 NOTE — Assessment & Plan Note (Signed)
Patient with history of chronic venous insufficiency. Patient endorses slight worsening of his lower extremity edema since prior, left > right. He has tried compression stockings in the past but endorses that they are too tight and difficult to adhere to. Significant non pitting edema of bilateral lower extremities noted with skin changes consistent with chronic venous insufficiency. Denies any fevers or chills.   - Encouraged for compliance with compression stockings and to keep feet elevated when sitting  - Encouraged for ambulation as able

## 2019-12-06 LAB — BMP8+ANION GAP
Anion Gap: 13 mmol/L (ref 10.0–18.0)
BUN/Creatinine Ratio: 14 (ref 10–24)
BUN: 15 mg/dL (ref 8–27)
CO2: 28 mmol/L (ref 20–29)
Calcium: 8.8 mg/dL (ref 8.6–10.2)
Chloride: 100 mmol/L (ref 96–106)
Creatinine, Ser: 1.05 mg/dL (ref 0.76–1.27)
GFR calc Af Amer: 78 mL/min/{1.73_m2} (ref >59)
GFR calc non Af Amer: 68 mL/min/{1.73_m2} (ref >59)
Glucose: 88 mg/dL (ref 65–99)
Potassium: 4.5 mmol/L (ref 3.5–5.2)
Sodium: 141 mmol/L (ref 134–144)

## 2019-12-06 LAB — LIPID PANEL
Chol/HDL Ratio: 2.8 ratio (ref 0.0–5.0)
Cholesterol, Total: 108 mg/dL (ref 100–199)
HDL: 39 mg/dL — ABNORMAL LOW (ref >39)
LDL Chol Calc (NIH): 47 mg/dL (ref 0–99)
Triglycerides: 119 mg/dL (ref 0–149)
VLDL Cholesterol Cal: 22 mg/dL (ref 5–40)

## 2019-12-06 LAB — TSH: TSH: 4.9 u[IU]/mL — ABNORMAL HIGH (ref 0.450–4.500)

## 2019-12-09 NOTE — Progress Notes (Signed)
Internal Medicine Clinic Attending ° °Case discussed with Dr. Aslam  At the time of the visit.  We reviewed the resident’s history and exam and pertinent patient test results.  I agree with the assessment, diagnosis, and plan of care documented in the resident’s note.  °

## 2019-12-16 ENCOUNTER — Telehealth: Payer: Self-pay

## 2019-12-16 NOTE — Telephone Encounter (Signed)
Pt's daughter requesting shower chair, please call back.

## 2019-12-17 ENCOUNTER — Encounter: Payer: Self-pay | Admitting: Podiatry

## 2019-12-17 ENCOUNTER — Ambulatory Visit: Payer: Medicare Other | Admitting: Podiatry

## 2019-12-17 ENCOUNTER — Other Ambulatory Visit: Payer: Self-pay

## 2019-12-17 DIAGNOSIS — R600 Localized edema: Secondary | ICD-10-CM

## 2019-12-17 DIAGNOSIS — M79674 Pain in right toe(s): Secondary | ICD-10-CM | POA: Diagnosis not present

## 2019-12-17 DIAGNOSIS — I872 Venous insufficiency (chronic) (peripheral): Secondary | ICD-10-CM | POA: Diagnosis not present

## 2019-12-17 DIAGNOSIS — M79675 Pain in left toe(s): Secondary | ICD-10-CM

## 2019-12-17 DIAGNOSIS — B351 Tinea unguium: Secondary | ICD-10-CM

## 2019-12-17 NOTE — Progress Notes (Signed)
Complaint:  Visit Type: Patient returns to my office for continued preventative foot care services. Complaint: Patient states" my nails have grown long and thick and become painful to walk and wear shoes" Patient has been diagnosed with DM with no foot complications. The patient presents for preventative foot care services.  Patient presents to the office with his daughter.  Podiatric Exam: Vascular: dorsalis pedis  are weakly palpable bilateral. Posterior tibial pulses are absent  B/L. Capillary return is immediate. Temperature gradient is WNL. Skin turgor WNL  Sensorium: Normal Semmes Weinstein monofilament test. Normal tactile sensation bilaterally. Nail Exam: Pt has thick disfigured discolored nails with subungual debris noted bilateral entire nail hallux through fifth toenails Ulcer Exam: There is no evidence of ulcer or pre-ulcerative changes or infection. Orthopedic Exam: Muscle tone and strength are WNL. No limitations in general ROM. No crepitus or effusions noted. Foot type and digits show no abnormalities. Bony prominences are unremarkable. Skin: No Porokeratosis. No infection or ulcers  Diagnosis:  Onychomycosis, , Pain in right toe, pain in left toes  Treatment & Plan Procedures and Treatment: Consent by patient was obtained for treatment procedures.   Debridement of mycotic and hypertrophic toenails, 1 through 5 bilateral and clearing of subungual debris. No ulceration, no infection noted.  Return Visit-Office Procedure: Patient instructed to return to the office for a follow up visit 3 months for continued evaluation and treatment.    Gardiner Barefoot DPM

## 2020-01-01 NOTE — Progress Notes (Signed)
° °  CC: HTN, diarrhea, PAD, and need for shower chair  HPI:  [name] is a 79 y.o. with a history of chronic venous insufficiency, hypertension, PAD, and LVH presenting for follow-up of his HTN, diarrhea, PAD, and need for shower chair.   Past Medical History:  Diagnosis Date   Allergy    Arthritis    Cellulitis 05/07/2019   Chronic renal insufficiency    Glaucoma    Hypertension    Review of Systems:   Constitutional: Negative for chills and fever.  Respiratory: Negative for shortness of breath.   Cardiovascular: Negative for chest pain . Positive for leg swelling.  Gastrointestinal: Negative for abdominal pain, nausea and vomiting. Positive for diarrhea. Neurological: Negative for dizziness and headaches.   Physical Exam:  Vitals:   01/02/20 1535 01/02/20 1543  BP: (!) 144/67 131/76  Pulse: 83 84  Temp: 98 F (36.7 C)   TempSrc: Oral   SpO2: 99%   Weight: 235 lb (106.6 kg)   Height: 5\' 9"  (1.753 m)    Physical Exam Constitutional:      Appearance: Normal appearance.  Cardiovascular:     Rate and Rhythm: Normal rate and regular rhythm.     Pulses: Normal pulses.     Heart sounds: Normal heart sounds.  Pulmonary:     Effort: Pulmonary effort is normal.     Breath sounds: Normal breath sounds.  Abdominal:     General: Abdomen is flat. Bowel sounds are normal.     Palpations: Abdomen is soft.  Musculoskeletal:        General: Swelling (1+ BL LE swelling) present.  Skin:    General: Skin is warm and dry.     Capillary Refill: Capillary refill takes less than 2 seconds.     Comments: Chronic venous statis changes, difficult to assess pulses, warm extremities  Neurological:     General: No focal deficit present.     Mental Status: He is alert and oriented to person, place, and time.  Psychiatric:        Mood and Affect: Mood normal.        Behavior: Behavior normal.      Assessment & Plan:   See Encounters Tab for problem based  charting.  Patient discussed with Dr. Heber

## 2020-01-02 ENCOUNTER — Ambulatory Visit (INDEPENDENT_AMBULATORY_CARE_PROVIDER_SITE_OTHER): Payer: Medicare Other | Admitting: Internal Medicine

## 2020-01-02 ENCOUNTER — Encounter: Payer: Self-pay | Admitting: Internal Medicine

## 2020-01-02 ENCOUNTER — Other Ambulatory Visit: Payer: Self-pay

## 2020-01-02 ENCOUNTER — Telehealth: Payer: Self-pay | Admitting: *Deleted

## 2020-01-02 VITALS — BP 131/76 | HR 84 | Temp 98.0°F | Ht 69.0 in | Wt 235.0 lb

## 2020-01-02 DIAGNOSIS — I739 Peripheral vascular disease, unspecified: Secondary | ICD-10-CM | POA: Diagnosis not present

## 2020-01-02 DIAGNOSIS — R197 Diarrhea, unspecified: Secondary | ICD-10-CM | POA: Insufficient documentation

## 2020-01-02 DIAGNOSIS — I872 Venous insufficiency (chronic) (peripheral): Secondary | ICD-10-CM

## 2020-01-02 DIAGNOSIS — Z7982 Long term (current) use of aspirin: Secondary | ICD-10-CM

## 2020-01-02 DIAGNOSIS — I10 Essential (primary) hypertension: Secondary | ICD-10-CM | POA: Diagnosis not present

## 2020-01-02 DIAGNOSIS — R7989 Other specified abnormal findings of blood chemistry: Secondary | ICD-10-CM | POA: Insufficient documentation

## 2020-01-02 DIAGNOSIS — N179 Acute kidney failure, unspecified: Secondary | ICD-10-CM | POA: Diagnosis not present

## 2020-01-02 MED ORDER — ASPIRIN EC 81 MG PO TBEC: 81.0000 mg | DELAYED_RELEASE_TABLET | Freq: Every day | ORAL | 5 refills | Status: DC

## 2020-01-02 MED ORDER — CHLORTHALIDONE 25 MG PO TABS: 25.0000 mg | ORAL_TABLET | Freq: Every day | ORAL | 5 refills | Status: DC

## 2020-01-02 MED ORDER — LISINOPRIL 40 MG PO TABS: 40.0000 mg | ORAL_TABLET | Freq: Every day | ORAL | 5 refills | Status: DC

## 2020-01-02 NOTE — Patient Instructions (Addendum)
[name],  It was a pleasure to see you today. Thank you for coming in.   Today we discussed your blood pressure and diarrhea.  Your blood pressure looks good however your medication could be contributing to the diarrhea.  We have adjusted your blood pressure medications.  Please stop taking the Zestoretic.  Start taking lisinopril 40 mg daily and chlorthalidone 25 mg daily.  We are checking some labs and will contact you if they are abnormal.  We also discussed your abnormal labs.  Your A1c shows that you are prediabetic, please work on your diet and exercise to try to bring this down.  Your TSH was mildly elevated, improved repeat repeating these labs to see if any treatment is needed.  Please follow-up with the vascular surgeon regarding your lower extremities.  Please continue taking the aspirin daily.  Please return to clinic in 1 month or sooner if needed.   Thank you again for coming in.   [name].D.

## 2020-01-02 NOTE — Telephone Encounter (Signed)
Patient has appt today with Red Team and need for shower chair can be addressed at that time. Hubbard Hartshorn, BSN, RN-BC

## 2020-01-02 NOTE — Assessment & Plan Note (Signed)
  Patient reports that for over a month he has been having increased bowel movements and loose bowel movements. He states that started prior to his last appointment however failed to mention it during that appointment. He states he has about 4-5 bowel movements per day, and is very loose and watery. Reports two episodes of bowel incontinence while he was going to the grocery store. He has never had this before. He may have tried Imodium but was unsure if that helped. Bowel movements do not change with diet or with eating, still has it even without eating. Denies any blood or black bowel movements. Denies any abdominal pain, nausea, vomiting, fevers, chills, lightheadedness, dizziness, fatigue, weakness, or any other symptoms. Denies any recent travel or recent sick contacts. Reports that the only medication change was an increase in the Zestoretic. On exam he is well-appearing, cardiac and pulmonary exam are unremarkable, vitals are stable. He declined a rectal exam at this time. We discussed that this could be related to the increased dose of the red Zestoretic, hydrochlorothiazide can cause similar symptoms especially at the higher dose. Discussed adjusting his regimen for now however if symptoms still persist on his follow-up then would recommend obtaining a rectal exam.  -Discontinue Zestoretic -Advised to monitor symptoms and dietary habits -RTC in 1 month

## 2020-01-02 NOTE — Telephone Encounter (Signed)
Patient has appt with Red Team today and need for shower chair can be addressed at that time. Hubbard Hartshorn, BSN, RN-BC

## 2020-01-02 NOTE — Assessment & Plan Note (Signed)
TSH is mildly elevated at 5.9. Does have some diarrhea however this may be related to his medication. Denies any other symptoms of hypothyroidism. Will repeat TSH and free T4 today.

## 2020-01-02 NOTE — Assessment & Plan Note (Signed)
Patient has a history of PAD, he is currently on aspirin 81 mg daily and denies any issues taking this medication. He states that his bilateral lower extremities occasionally will swell however it does improve with laying flat raising them up. He does have some numbness in his leg with standing. On exam he does have some bilateral lower extremity edema, pulses difficult to examine. Noted to have left lower extremity PAD on prior office visit ABIs. He now has of an appointment to follow-up with vascular surgery.

## 2020-01-02 NOTE — Assessment & Plan Note (Signed)
Patient is currently on Zestoretic 20-25 mg twice daily.  Blood pressure today is well controlled at 131/76.  He denies any issues taking this medication however has been having issues with diarrhea. Unclear if this is related to his medications, HCTZ can cause diarrhea with an increased risk at the higher dose. Discussed adjusting his medication to see if this helps with his diarrhea.   -Stop zestoretic -Start lisinopril 40 mg daily -Start chlorthalidone 25 mg daily -Check BMP -RTC in 1 month for repeat BP check

## 2020-01-02 NOTE — Assessment & Plan Note (Signed)
Patient requesting a shower chair, states that he has difficulty standing for long periods of time, he does have a shower at home, no issues getting in to out of the shower. He has a history of venous insufficiency and cannot stand for long periods of time. Will place order for shower chair.

## 2020-01-03 LAB — BMP8+ANION GAP
Anion Gap: 15 mmol/L (ref 10.0–18.0)
BUN/Creatinine Ratio: 15 (ref 10–24)
BUN: 21 mg/dL (ref 8–27)
CO2: 27 mmol/L (ref 20–29)
Calcium: 8.9 mg/dL (ref 8.6–10.2)
Chloride: 101 mmol/L (ref 96–106)
Creatinine, Ser: 1.43 mg/dL — ABNORMAL HIGH (ref 0.76–1.27)
GFR calc Af Amer: 54 mL/min/{1.73_m2} — ABNORMAL LOW (ref >59)
GFR calc non Af Amer: 47 mL/min/{1.73_m2} — ABNORMAL LOW (ref >59)
Glucose: 87 mg/dL (ref 65–99)
Potassium: 4 mmol/L (ref 3.5–5.2)
Sodium: 143 mmol/L (ref 134–144)

## 2020-01-03 LAB — TSH: TSH: 4.24 u[IU]/mL (ref 0.450–4.500)

## 2020-01-03 LAB — T4, FREE: Free T4: 1.22 ng/dL (ref 0.82–1.77)

## 2020-01-05 DIAGNOSIS — N179 Acute kidney failure, unspecified: Secondary | ICD-10-CM | POA: Insufficient documentation

## 2020-01-05 MED ORDER — ASPIRIN EC 81 MG PO TBEC: 81.0000 mg | DELAYED_RELEASE_TABLET | Freq: Every day | ORAL | 5 refills | Status: AC

## 2020-01-05 MED ORDER — LISINOPRIL 40 MG PO TABS: 40.0000 mg | ORAL_TABLET | Freq: Every day | ORAL | 5 refills | Status: DC

## 2020-01-05 MED ORDER — CHLORTHALIDONE 25 MG PO TABS: 25.0000 mg | ORAL_TABLET | Freq: Every day | ORAL | 5 refills | Status: DC

## 2020-01-05 NOTE — Addendum Note (Signed)
Addended by: [name] on: 01/05/2020 12:10 PM   Modules accepted: Orders

## 2020-01-05 NOTE — Assessment & Plan Note (Signed)
Addendum: Obtained a BMP during visit that showed a increase in his Cr to 1.4 from baseline of around 1. Contacted patient to inform him of this information. He reports that he is still taking the Zestoretic, he reports that his diarrhea has slowed down. Still having 5-8 BM per day. He is eating a drinking alright. Advised to hold the lisinopril and return to clinic for a repeat BMP.

## 2020-01-06 NOTE — Progress Notes (Signed)
Internal Medicine Clinic Attending ° °Case discussed with Dr. Krienke  At the time of the visit.  We reviewed the resident’s history and exam and pertinent patient test results.  I agree with the assessment, diagnosis, and plan of care documented in the resident’s note.  °

## 2020-01-07 ENCOUNTER — Other Ambulatory Visit (INDEPENDENT_AMBULATORY_CARE_PROVIDER_SITE_OTHER): Payer: Medicare Other

## 2020-01-07 ENCOUNTER — Other Ambulatory Visit: Payer: Self-pay

## 2020-01-07 DIAGNOSIS — N179 Acute kidney failure, unspecified: Secondary | ICD-10-CM

## 2020-01-07 NOTE — Telephone Encounter (Signed)
Theophilus Bones, RN; Sandi Raveling, Ferd Hibbs, Georges Mouse   received, thank

## 2020-01-07 NOTE — Telephone Encounter (Signed)
CM sent to Skeet Latch at [employer] for shower chair. F2F was 01/02/2020. Hubbard Hartshorn, BSN, RN-BC

## 2020-01-08 LAB — BMP8+ANION GAP
Anion Gap: 14 mmol/L (ref 10.0–18.0)
BUN/Creatinine Ratio: 13 (ref 10–24)
BUN: 17 mg/dL (ref 8–27)
CO2: 28 mmol/L (ref 20–29)
Calcium: 9 mg/dL (ref 8.6–10.2)
Chloride: 100 mmol/L (ref 96–106)
Creatinine, Ser: 1.31 mg/dL — ABNORMAL HIGH (ref 0.76–1.27)
GFR calc Af Amer: 60 mL/min/{1.73_m2} (ref >59)
GFR calc non Af Amer: 52 mL/min/{1.73_m2} — ABNORMAL LOW (ref >59)
Glucose: 159 mg/dL — ABNORMAL HIGH (ref 65–99)
Potassium: 4.1 mmol/L (ref 3.5–5.2)
Sodium: 142 mmol/L (ref 134–144)

## 2020-01-22 ENCOUNTER — Telehealth: Payer: Self-pay | Admitting: *Deleted

## 2020-01-22 ENCOUNTER — Other Ambulatory Visit (INDEPENDENT_AMBULATORY_CARE_PROVIDER_SITE_OTHER): Payer: Medicare Other

## 2020-01-22 ENCOUNTER — Other Ambulatory Visit: Payer: Self-pay

## 2020-01-22 DIAGNOSIS — I10 Essential (primary) hypertension: Secondary | ICD-10-CM | POA: Diagnosis not present

## 2020-01-22 LAB — BASIC METABOLIC PANEL
Anion gap: 9 (ref 5–15)
BUN: 19 mg/dL (ref 8–23)
CO2: 33 mmol/L — ABNORMAL HIGH (ref 22–32)
Calcium: 9.2 mg/dL (ref 8.9–10.3)
Chloride: 98 mmol/L (ref 98–111)
Creatinine, Ser: 1.31 mg/dL — ABNORMAL HIGH (ref 0.61–1.24)
GFR calc Af Amer: >60 mL/min (ref >60)
GFR calc non Af Amer: 52 mL/min — ABNORMAL LOW (ref >60)
Glucose, Bld: 123 mg/dL — ABNORMAL HIGH (ref 70–99)
Potassium: 3.8 mmol/L (ref 3.5–5.1)
Sodium: 140 mmol/L (ref 135–145)

## 2020-01-22 NOTE — Telephone Encounter (Signed)
Result note copied from 01/07/2020 Lab Results Result Notes  [name], MD  01/14/2020 8:42 AM EDT Back to Top    Contacted patient regarding results of persistent elevated Cr. They reported his diarrhea has improved, he now has switched to the new BP medications. Advised to make follow up appointment to discuss symptoms, repeat labs, and repeat BP. Daughter reports that she will call in to make an appointment.

## 2020-01-27 NOTE — Telephone Encounter (Signed)
He still has an acute kidney injury and needs to come in for a visit to get more history and labs for work-up.

## 2020-01-27 NOTE — Telephone Encounter (Addendum)
LATE ENTRY: Pt was arrived on 01/22/2020 for lab only-pt's dtr wasn't prepared for MD visit (didn't have time) and had brought pt over on her lunch break.  Labs were obtained-Pt's BP 131/87. Pt (and dtr) requests call back with further instructions and when next visit should be.  Will send to appropriate team for review. Please advise.Despina Hidden Cassady9/14/202111:03 AM   Pt can also be scheduled for tele-health visit if appropriate.Marland Kitchen

## 2020-02-09 NOTE — Telephone Encounter (Signed)
Message left on recorder for return call to-needs follow up appt.Daniel Hidden Cassady9/27/20214:23 PM

## 2020-02-10 NOTE — Telephone Encounter (Signed)
Pt has sch an appt for 02/12/2020.

## 2020-02-11 NOTE — Progress Notes (Signed)
   CC: HTN follow-up  HPI:  [name] is a 79 y.o. gentleman with history of HTN, PAD, chronic venous insufficiency who presents to clinic for follow-up of his HTN and recent AKI. His last clinic visit was on 01/02/20 with Dr. Sherry Ruffing.   To see the details of this patient's management of their acute and chronic problems, please refer to the Assessment & Plan under the Encounters tab.    Past Medical History:  Diagnosis Date  . Allergy   . Arthritis   . Cellulitis 05/07/2019  . Chronic renal insufficiency   . Glaucoma   . Hypertension    Review of Systems:    Review of Systems  Constitutional: Negative for chills and fever.  Eyes: Negative for blurred vision.  Respiratory: Negative for cough and shortness of breath.   Cardiovascular: Negative for chest pain.  Gastrointestinal: Negative for abdominal pain.  Neurological: Negative for dizziness, weakness and headaches.    Physical Exam:  Vitals:   02/12/20 1100  BP: (!) 154/71  Pulse: (!) 101  Temp: 98.3 F (36.8 C)  TempSrc: Oral  SpO2: 97%  Weight: 246 lb 3.2 oz (111.7 kg)   Constitutional: well-appearing, obese, gentleman sitting in chair, in no acute distress HENT: normocephalic atraumatic, mucous membranes moist Eyes: conjunctiva non-erythematous Neck: supple Cardiovascular: regular rate and rhythm, no m/r/g Pulmonary/Chest: normal work of breathing on room air, lungs clear to auscultation bilaterally Abdominal: protuberant (baseline, per patient), non-tender MSK: normal bulk and tone Neurological: alert & oriented x 3 Skin/Extremities: 1+ bilateral lower extremity edema with overlying chronic venous statis changes, difficult to assess pulses, warm extremities    Assessment & Plan:   See Encounters Tab for problem based charting.  Patient seen with Dr. Dareen Piano

## 2020-02-12 ENCOUNTER — Encounter: Payer: Self-pay | Admitting: Student

## 2020-02-12 ENCOUNTER — Ambulatory Visit (INDEPENDENT_AMBULATORY_CARE_PROVIDER_SITE_OTHER): Payer: Medicare Other | Admitting: Student

## 2020-02-12 VITALS — BP 154/71 | HR 101 | Temp 98.3°F | Wt 246.2 lb

## 2020-02-12 DIAGNOSIS — R197 Diarrhea, unspecified: Secondary | ICD-10-CM

## 2020-02-12 DIAGNOSIS — Z23 Encounter for immunization: Secondary | ICD-10-CM

## 2020-02-12 DIAGNOSIS — Z6836 Body mass index (BMI) 36.0-36.9, adult: Secondary | ICD-10-CM

## 2020-02-12 DIAGNOSIS — Z0001 Encounter for general adult medical examination with abnormal findings: Secondary | ICD-10-CM

## 2020-02-12 DIAGNOSIS — N179 Acute kidney failure, unspecified: Secondary | ICD-10-CM

## 2020-02-12 DIAGNOSIS — I10 Essential (primary) hypertension: Secondary | ICD-10-CM | POA: Diagnosis not present

## 2020-02-12 DIAGNOSIS — Z0000 Encounter for general adult medical examination without abnormal findings: Secondary | ICD-10-CM

## 2020-02-12 DIAGNOSIS — I872 Venous insufficiency (chronic) (peripheral): Secondary | ICD-10-CM | POA: Diagnosis not present

## 2020-02-12 NOTE — Assessment & Plan Note (Signed)
Patient reports complete resolution of his diarrhea. Has been working on hydrating, drinking on average four 16-oz bottles of water daily.

## 2020-02-12 NOTE — Patient Instructions (Addendum)
Daniel [name],   Thank you for your visit to the [facility] today. It was a pleasure meeting you. Today we discussed the following:  1) Blood pressure - Your blood pressure was elevated at 154/71 today - We would like to start you on another blood pressure medicine in addition to your chlorthalidone 25 mg daily, but we have to wait and see the results of your lab work today. We will decide which medicine based on that result. - I will call you back with a recommendation once I have that result. - Keep monitoring your blood pressure at home.  2) Diarrhea: I'm glad to hear that your diarrhea has resolved!  - Keep up the good work staying hydrated  3) Flu shot: you received your flu shot today  4) You are eligible for your COVID-19 booster! I would call your pharmacy or go online to schedule this.  5) Dietary changes: we discussed some principles of healthy eating. Let us know if you are interested in having a longer conversation about this.   We would like to see you back in 1 month for a blood pressure check. Please bring all of your medications with you.   If you have any questions or concerns, please call our clinic at [phone] between 9am-5pm. Outside of these hours, call [phone] and ask for the internal medicine resident on call. If you feel you are having a medical emergency please call 911.    HOW TO TAKE YOUR BLOOD PRESSURE:  Rest 5 minutes before taking your blood pressure.  Don't smoke or drink caffeinated beverages for at least 30 minutes before.  Take your blood pressure before (not after) you eat.  Sit comfortably with your back supported and both feet on the floor (don't cross your legs).  Elevate your arm to heart level on a table or a desk.  Use the proper sized cuff. It should fit smoothly and snugly around your bare upper arm. There should be enough room to slip a fingertip under the cuff. The bottom edge of the cuff should be 1 inch  above the crease of the elbow.  Ideally, take 3 measurements at one sitting and record the average.

## 2020-02-12 NOTE — Assessment & Plan Note (Signed)
Patient reports his diarrhea has resolved. He has been taking chlorthalidone 25 mg daily since being advised to discontinue the lisinopril 40 mg daily. States he has been trying to stay hydrating, drinking on average four 16 oz bottles of water daily.   Plan: - Repeat BMP today - result will determine which antihypertensive we will add. If back to baseline, would consider losartan; if still elevated, would consider carvedilol

## 2020-02-12 NOTE — Assessment & Plan Note (Addendum)
BP 154/71 and stable on repeat today. Patient reports his home measurements have averaged 136-139/70s. He denies headache, dizziness, lightheadedness. Has been taking chlorthalidone 25 mg daily as directed over the last month. Was previously on Zestoretic (HCTZ-lisinopril) 20-25 mg twice daily however was taken off of this due to suspicion at last visit that his diarrhea may be in part due to HCTZ. Was on lisinopril and chlorthalidone at first however lisinopril was discontinued secondary to AKI (sCr went from baseline ~1 to 1.43 on 8/20, then 1.31 on 8/25).  Plan: Patient is not at goal with his BP, so we will plan on adding another antihypertensive. Specific therapy will depend on sCr from BMP today. - BMP today - If sCr back to baseline, consider addition of losartan. If sCr not back to baseline, consider addition of carvedilol. - continue chlorthalidone 25 mg daily  Return to clinic in 52-month for BP check.

## 2020-02-12 NOTE — Assessment & Plan Note (Signed)
Patient reports he obtained the shower chair requested at last visit. Today requests we fill out jury duty exemption secondary to difficulty sitting or standing for long periods of time. Reports he has used compression stockings previously but not recently.  Exam consistent with chronic venous stasis changes.  Plan: - jury duty paperwork filled out; letter printed and to be sent to patient - advised patient to use his compression stockings

## 2020-02-12 NOTE — Assessment & Plan Note (Addendum)
Patient reports he is trying to make dietary changes. Has a daughter who is diabetic who has made improvements to her dietary habits. He is trying to work on his portion control. He expressed that he would like to continue making changes on his own for now.  Plan: - Discussed importance of healthy eating, including strategies such as incorporating more vegetables, fruits, and lean proteins, while cutting down on sugary beverages, sweet treats, and processed foods - Discussed goal of 150 to 300 minutes (2  to 5 hours) a week of moderate-intensity activity such as brisk walking - Consider future referral to nutrition

## 2020-02-13 ENCOUNTER — Ambulatory Visit (INDEPENDENT_AMBULATORY_CARE_PROVIDER_SITE_OTHER): Payer: Medicare Other | Admitting: Vascular Surgery

## 2020-02-13 ENCOUNTER — Other Ambulatory Visit: Payer: Self-pay

## 2020-02-13 ENCOUNTER — Encounter: Payer: Self-pay | Admitting: Vascular Surgery

## 2020-02-13 VITALS — BP 175/84 | HR 81 | Resp 20 | Ht 69.0 in | Wt 244.0 lb

## 2020-02-13 DIAGNOSIS — I739 Peripheral vascular disease, unspecified: Secondary | ICD-10-CM | POA: Diagnosis not present

## 2020-02-13 DIAGNOSIS — I872 Venous insufficiency (chronic) (peripheral): Secondary | ICD-10-CM

## 2020-02-13 LAB — BMP8+ANION GAP
Anion Gap: 15 mmol/L (ref 10.0–18.0)
BUN/Creatinine Ratio: 15 (ref 10–24)
BUN: 18 mg/dL (ref 8–27)
CO2: 30 mmol/L — ABNORMAL HIGH (ref 20–29)
Calcium: 8.8 mg/dL (ref 8.6–10.2)
Chloride: 99 mmol/L (ref 96–106)
Creatinine, Ser: 1.24 mg/dL (ref 0.76–1.27)
GFR calc Af Amer: 64 mL/min/{1.73_m2} (ref >59)
GFR calc non Af Amer: 55 mL/min/{1.73_m2} — ABNORMAL LOW (ref >59)
Glucose: 215 mg/dL — ABNORMAL HIGH (ref 65–99)
Potassium: 3.6 mmol/L (ref 3.5–5.2)
Sodium: 144 mmol/L (ref 134–144)

## 2020-02-13 MED ORDER — LOSARTAN POTASSIUM 25 MG PO TABS: 25.0000 mg | ORAL_TABLET | Freq: Every day | ORAL | 3 refills | Status: DC

## 2020-02-13 MED ORDER — CARVEDILOL 6.25 MG PO TABS: 6.2500 mg | ORAL_TABLET | Freq: Two times a day (BID) | ORAL | 11 refills | Status: DC

## 2020-02-13 NOTE — Progress Notes (Signed)
Internal Medicine Clinic Attending  I saw and evaluated the patient.  I personally confirmed the key portions of the history and exam documented by Dr. Watson and I reviewed pertinent patient test results.  The assessment, diagnosis, and plan were formulated together and I agree with the documentation in the resident's note.  

## 2020-02-13 NOTE — Telephone Encounter (Signed)
Pt's daughter stated the doctor was changing her father's BP med. Informed per Dr Philip Aspen note,  "We would like to start you on another blood pressure medicine in addition to your chlorthalidone 25 mg daily, but we have to wait and see the results of your lab work today. We will decide which medicine based on that result."  Pt's daughter wants rx sent to [place].

## 2020-02-13 NOTE — Telephone Encounter (Signed)
Pt's daughter called and informed of new rx for Carvedilol 6.25 mg BID.

## 2020-02-13 NOTE — Addendum Note (Signed)
Addended by: Iona Beard on: 02/13/2020 01:25 PM   Modules accepted: Orders

## 2020-02-13 NOTE — Progress Notes (Signed)
Patient ID: Daniel [name], male   DOB: 1941/05/13, 79 y.o.   MRN: [id number]  Reason for Consult: New Patient (Initial Visit)   Referred by Maudie Mercury, MD  Subjective:     HPI:  Daniel [name] is a 79 y.o. male without previous vascular disease.  He does have risk factors of hypertension.  He has chronic swelling of his lower extremities with skin changes.  He has worn compression stockings in the past cannot wear them now.  He did not have studies performed prior to today's visit.  He does take aspirin daily.  Past Medical History:  Diagnosis Date  . Allergy   . Arthritis   . Cellulitis 05/07/2019  . Chronic renal insufficiency   . Glaucoma   . Hypertension    Family History  Problem Relation Age of Onset  . Diabetes Mother   . Colon cancer Neg Hx   . Esophageal cancer Neg Hx   . Stomach cancer Neg Hx   . Pancreatic cancer Neg Hx    Past Surgical History:  Procedure Laterality Date  . COLONOSCOPY    . EYE SURGERY     B cataract surgery. [name].    Short Social History:  Social History   Tobacco Use  . Smoking status: Former Smoker    Quit date: 05/15/1968    Years since quitting: 51.7  . Smokeless tobacco: Never Used  Substance Use Topics  . Alcohol use: Yes    Comment: rarely.    No Known Allergies  Current Outpatient Medications  Medication Sig Dispense Refill  . aspirin EC 81 MG tablet Take 1 tablet (81 mg total) by mouth daily. Swallow whole. 30 tablet 5  . chlorthalidone (HYGROTON) 25 MG tablet Take 1 tablet (25 mg total) by mouth daily. 30 tablet 5  . lisinopril (ZESTRIL) 40 MG tablet Take 1 tablet (40 mg total) by mouth daily. (Patient not taking: Reported on 02/13/2020) 30 tablet 5  . Vitamin E 400 units TABS Take by mouth. (Patient not taking: Reported on 02/13/2020)     No current facility-administered medications for this visit.    Review of Systems  Constitutional:  Constitutional negative. HENT: HENT negative.  Eyes: Eyes negative.    Cardiovascular: Cardiovascular negative.  GI: Gastrointestinal negative.  Musculoskeletal: Positive for leg pain.  Skin: Positive for rash.  Neurological: Neurological negative. Hematologic: Hematologic/lymphatic negative.  Psychiatric: Psychiatric negative.        Objective:  Objective   Vitals:   02/13/20 1125  BP: (!) 175/84  Pulse: 81  Resp: 20  SpO2: 96%  Weight: 244 lb (110.7 kg)  Height: 5\' 9"  (1.753 m)   Body mass index is 36.03 kg/m.  Physical Exam HENT:     Head: Normocephalic.     Nose:     Comments: Wearing a mask Eyes:     Pupils: Pupils are equal, round, and reactive to light.  Cardiovascular:     Comments: I can only detect venous appearing signals in his bilateral feet and ankles Pulmonary:     Effort: Pulmonary effort is normal.  Abdominal:     General: Abdomen is flat.     Palpations: Abdomen is soft. There is no mass.  Musculoskeletal:     Right lower leg: Edema present.     Left lower leg: Edema present.  Neurological:     Mental Status: He is alert.  Psychiatric:        Mood and Affect: Mood normal.  Behavior: Behavior normal.        Thought Content: Thought content normal.        Judgment: Judgment normal.     Data: Screening ABIs demonstrated PAD on the left     Assessment/Plan:     79 year old male without previous lower extremity disease.  Had abnormal ABIs per the patient was on the left.  The ABI was read as PAD on screening.  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  He does not have any history of DVT in his legs.  He does not currently have any wounds.  We will get him back in 3 to 4 weeks to evaluate with reflux testing and ABIs.  I have recommended gentle compression until we have documented that his ABIs are greater than 0.6.  After that he may need stronger compression.  I do not think he needs an angiogram as this does appear to be mostly venous disease at this  time.     Waynetta Sandy MD [facility]

## 2020-02-13 NOTE — Telephone Encounter (Signed)
I have ordered carvedilol 6.5 mg twice daily for him. Please let his daughter know script was sent to preferred pharmacy.

## 2020-02-13 NOTE — Telephone Encounter (Signed)
Pt's daughter states there is no Rx at the pharmacy for her dad. Requesting to speak with a nurse. Please call back.

## 2020-02-17 ENCOUNTER — Other Ambulatory Visit: Payer: Self-pay | Admitting: *Deleted

## 2020-02-17 DIAGNOSIS — I872 Venous insufficiency (chronic) (peripheral): Secondary | ICD-10-CM

## 2020-02-17 DIAGNOSIS — I739 Peripheral vascular disease, unspecified: Secondary | ICD-10-CM

## 2020-03-12 ENCOUNTER — Encounter: Payer: Self-pay | Admitting: Internal Medicine

## 2020-03-12 ENCOUNTER — Other Ambulatory Visit: Payer: Self-pay

## 2020-03-12 ENCOUNTER — Ambulatory Visit (INDEPENDENT_AMBULATORY_CARE_PROVIDER_SITE_OTHER): Payer: Medicare Other | Admitting: Internal Medicine

## 2020-03-12 VITALS — BP 135/62 | HR 54 | Temp 99.6°F | Ht 69.0 in | Wt 235.4 lb

## 2020-03-12 DIAGNOSIS — N5082 Scrotal pain: Secondary | ICD-10-CM | POA: Diagnosis not present

## 2020-03-12 DIAGNOSIS — N179 Acute kidney failure, unspecified: Secondary | ICD-10-CM | POA: Diagnosis not present

## 2020-03-12 DIAGNOSIS — I10 Essential (primary) hypertension: Secondary | ICD-10-CM

## 2020-03-12 NOTE — Patient Instructions (Signed)
Thank you, [name] for allowing [place] to provide your care today. Today we discussed blood pressure and scrotal pain.    I have ordered the following labs for you:   Lab Orders     Culture, Urine     Urinalysis, Reflex Microscopic   Tests ordered today:  Scrotum Ultrasound  Referrals ordered today:   Referral Orders  No referral(s) requested today     I have ordered the following medication/changed the following medications:   Stop the following medications: There are no discontinued medications.   Start the following medications: No orders of the defined types were placed in this encounter.    Follow up: 3 months    Remember:   Should you have any questions or concerns please call the internal medicine clinic at [phone].     Marianna Payment, D.O. [place]

## 2020-03-12 NOTE — Progress Notes (Signed)
CC: Scrotal pain  HPI:  [name] is a 79 y.o. male with a past medical history stated below and presents today for scrotal pain. Please see problem based assessment and plan for additional details.  Past Medical History:  Diagnosis Date   Allergy    Arthritis    Cellulitis 05/07/2019   Chronic renal insufficiency    Glaucoma    Hypertension     Current Outpatient Medications on File Prior to Visit  Medication Sig Dispense Refill   aspirin EC 81 MG tablet Take 1 tablet (81 mg total) by mouth daily. Swallow whole. 30 tablet 5   carvedilol (COREG) 6.25 MG tablet Take 1 tablet (6.25 mg total) by mouth 2 (two) times daily. 60 tablet 11   chlorthalidone (HYGROTON) 25 MG tablet Take 1 tablet (25 mg total) by mouth daily. 30 tablet 5   Vitamin E 400 units TABS Take by mouth. (Patient not taking: Reported on 02/13/2020)     No current facility-administered medications on file prior to visit.    Family History  Problem Relation Age of Onset   Diabetes Mother    Colon cancer Neg Hx    Esophageal cancer Neg Hx    Stomach cancer Neg Hx    Pancreatic cancer Neg Hx     Social History   Socioeconomic History   Marital status: Widowed    Spouse name: Not on file   Number of children: Not on file   Years of education: Not on file   Highest education level: Not on file  Occupational History   Not on file  Tobacco Use   Smoking status: Former Smoker    Quit date: 05/15/1968    Years since quitting: 51.8   Smokeless tobacco: Never Used  Vaping Use   Vaping Use: Never used  Substance and Sexual Activity   Alcohol use: Yes    Comment: rarely.   Drug use: No   Sexual activity: Not on file  Other Topics Concern   Not on file  Social History Narrative   Marital status: widowed since 2010; married x 37 years. Not dating in 2018.      Children: 3 daughters; no sons; 7 grandchildren; 2 gg.      Lives: alone in house in neighborhood.      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       Tobacco:  Quit smoking 30 years ago.      Alcohol: weekends. Scotch.      Exercise: sporadic; cuts grass and trims hedges. Stays busy in yard.        Seatbelt:  100%      Guns: none      ADLs: drives; independent ADLs; buys groceries; pays bills.  Cut grass.  NO cane or assistant devices.      Advanced Directives: yes; No CPR; DNI/DNR.              Social Determinants of Health   Financial Resource Strain:    Difficulty of Paying Living Expenses: Not on file  Food Insecurity:    Worried About Charity fundraiser in the Last Year: Not on file   [employer] of Food in the Last Year: Not on file  Transportation Needs:    Lack of Transportation (Medical): Not on file   Lack of Transportation (Non-Medical): Not on file  Physical Activity:    Days of Exercise per Week: Not on file   Minutes of Exercise  per Session: Not on file  Stress:    Feeling of Stress : Not on file  Social Connections:    Frequency of Communication with Friends and Family: Not on file   Frequency of Social Gatherings with Friends and Family: Not on file   Attends Religious Services: Not on file   Active Member of Clubs or Organizations: Not on file   Attends Archivist Meetings: Not on file   Marital Status: Not on file  Intimate Partner Violence:    Fear of Current or Ex-Partner: Not on file   Emotionally Abused: Not on file   Physically Abused: Not on file   Sexually Abused: Not on file    Review of Systems: ROS negative except for what is noted on the assessment and plan.  Vitals:   03/12/20 1058  BP: 135/62  Pulse: (!) 54  Temp: 99.6 F (37.6 C)  TempSrc: Oral  SpO2: 95%  Weight: 235 lb 6.4 oz (106.8 kg)  Height: 5\' 9"  (1.753 m)     Physical Exam: Physical Exam Constitutional:      Appearance: Normal appearance.  HENT:     Head: Normocephalic and atraumatic.  Cardiovascular:     Rate and  Rhythm: Normal rate.     Pulses: Normal pulses.     Heart sounds: Normal heart sounds.  Pulmonary:     Effort: Pulmonary effort is normal.     Breath sounds: Normal breath sounds.  Genitourinary:    Comments: Scrotal pain in swelling, particularly on the right testicle. Cremasteric reflex intact but produces pain. Decreased pain with scrotal support.  Musculoskeletal:        General: Normal range of motion.     Cervical back: Normal range of motion.     Right lower leg: No edema.     Left lower leg: No edema.  Skin:    General: Skin is warm and dry.  Neurological:     Mental Status: He is alert and oriented to person, place, and time. Mental status is at baseline.  Psychiatric:        Mood and Affect: Mood normal.      Assessment & Plan:   See Encounters Tab for problem based charting.  Patient discussed with Dr. Karie Schwalbe, D.O. [facility], PGY-2 Pager: [phone], Phone: [phone] Date 03/14/2020 Time 10:26 AM

## 2020-03-13 LAB — MICROSCOPIC EXAMINATION
Casts: NONE SEEN /lpf
Epithelial Cells (non renal): NONE SEEN /hpf (ref 0–10)
RBC, Urine: NONE SEEN /hpf (ref 0–2)
WBC, UA: >30 /hpf — AB (ref 0–5)

## 2020-03-13 LAB — URINALYSIS, ROUTINE W REFLEX MICROSCOPIC
Bilirubin, UA: NEGATIVE
Glucose, UA: NEGATIVE
Ketones, UA: NEGATIVE
Nitrite, UA: NEGATIVE
Specific Gravity, UA: 1.02 (ref 1.005–1.030)
Urobilinogen, Ur: 1 mg/dL (ref 0.2–1.0)
pH, UA: 6 (ref 5.0–7.5)

## 2020-03-14 ENCOUNTER — Encounter: Payer: Self-pay | Admitting: Internal Medicine

## 2020-03-14 DIAGNOSIS — N5082 Scrotal pain: Secondary | ICD-10-CM | POA: Insufficient documentation

## 2020-03-14 NOTE — Assessment & Plan Note (Signed)
Patient continue to have improvement of his kidney function from his last BMP. Cr 1.3>1.24. Will hold off on BMP today.

## 2020-03-14 NOTE — Assessment & Plan Note (Addendum)
Patient presents with a 4 day history of scrotal pain and swelling. The pain is worse with palpation and better when he is not moving. He also has pain relief with supportive underwear.  He states that the pain developed slowly over the course of a day or so. He denies any recent unprotected sexual intercourse in the last several years or trauma. He denies any fever, chills, urethral discharge, dysuria, hematuria.   On evaluation, his scrotum is swollen on the right side with pain to palpation of the right testicle. Cremasteric reflex is intact, but produces pain. Pain improved with support of his scrotum.   Symptoms are likely due to epididymitis.  Plan: - UA with culture - Scrotum US to rule out structural problems.

## 2020-03-14 NOTE — Assessment & Plan Note (Signed)
Patient presents for reevaluation of his HTN. His BP today is 152/68 and 140/81 on repeat. He is taking carvedilol 6.25 mg BID and chlorthalidone 25 mg daily.   Plan: - Check BMP today - Continue current medications

## 2020-03-15 ENCOUNTER — Telehealth: Payer: Self-pay | Admitting: Internal Medicine

## 2020-03-15 DIAGNOSIS — N5082 Scrotal pain: Secondary | ICD-10-CM

## 2020-03-15 MED ORDER — CEPHALEXIN 500 MG PO CAPS: 500.0000 mg | ORAL_CAPSULE | Freq: Four times a day (QID) | ORAL | 0 refills | Status: AC

## 2020-03-15 NOTE — Telephone Encounter (Signed)
Call patient with results from recent appointment.  Spoke with patient's daughter Levada Dy this morning regarding the patient's urinalysis results.  Informed her that I would prescribe short course of Keflex for his E. coli urinary infection.  She admits understanding.  Marianna Payment, D.O. [facility], PGY-2 Pager: [phone], Phone: [phone] Date 03/15/2020 Time 3:51 PM

## 2020-03-15 NOTE — Progress Notes (Signed)
Internal Medicine Clinic Attending  Case discussed with Dr. Coe  At the time of the visit.  We reviewed the resident's history and exam and pertinent patient test results.  I agree with the assessment, diagnosis, and plan of care documented in the resident's note.  

## 2020-03-16 LAB — URINE CULTURE

## 2020-03-23 ENCOUNTER — Ambulatory Visit (HOSPITAL_COMMUNITY)
Admission: RE | Admit: 2020-03-23 | Discharge: 2020-03-23 | Disposition: A | Discharge status: Home / Self Care | Payer: Medicare Other | Source: Ambulatory Visit | Attending: Student in an Organized Health Care Education/Training Program | Admitting: Student in an Organized Health Care Education/Training Program

## 2020-03-23 DIAGNOSIS — N5082 Scrotal pain: Secondary | ICD-10-CM | POA: Insufficient documentation

## 2020-03-23 IMAGING — US US SCROTUM W/ DOPPLER COMPLETE
1 series · 13 of 25 positions shown · non-contrast
Comparison: None.

CLINICAL DATA: Scrotal swelling for 1 week

EXAM:
SCROTAL ULTRASOUND
DOPPLER ULTRASOUND OF THE TESTICLES
TECHNIQUE: Complete ultrasound examination of the testicles, epididymis, and
other scrotal structures was performed. Color and spectral Doppler
ultrasound were also utilized to evaluate blood flow to the
testicles.

[Series 1: us scrotum w/doppler · 13 of 59 slices shown]
[im 1/59]
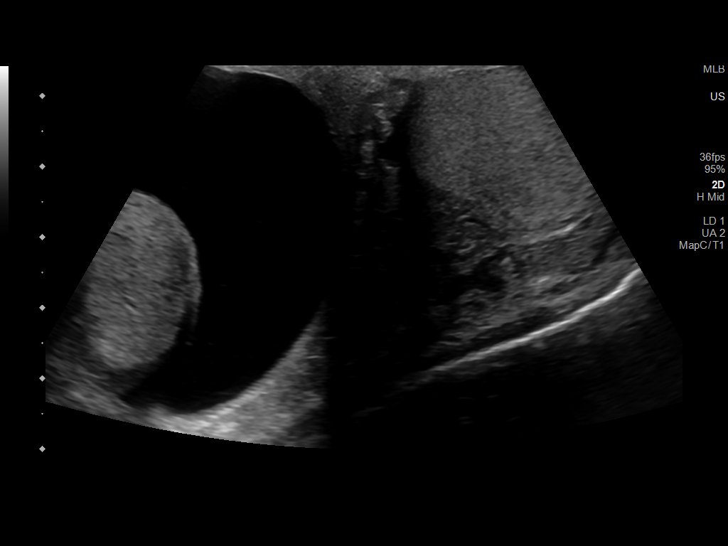
[im 5/59]
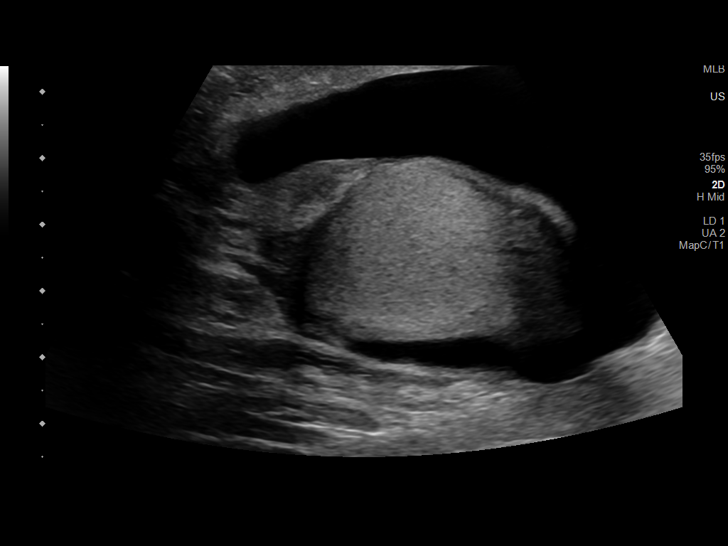
[im 10/59]
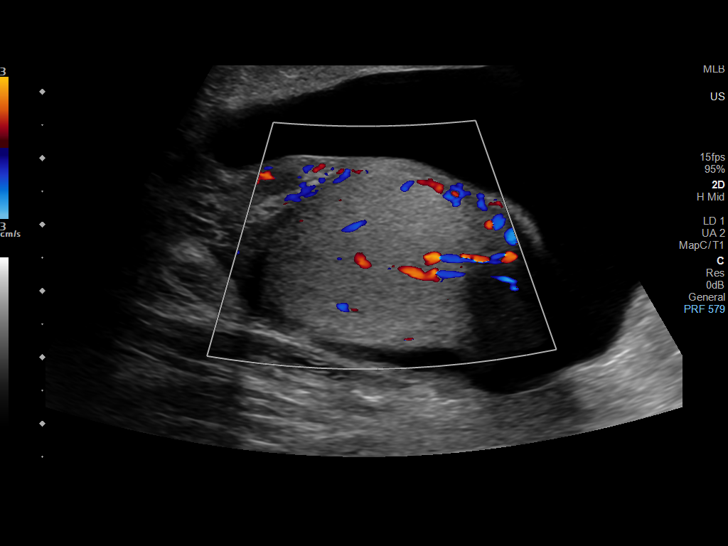
[im 15/59]
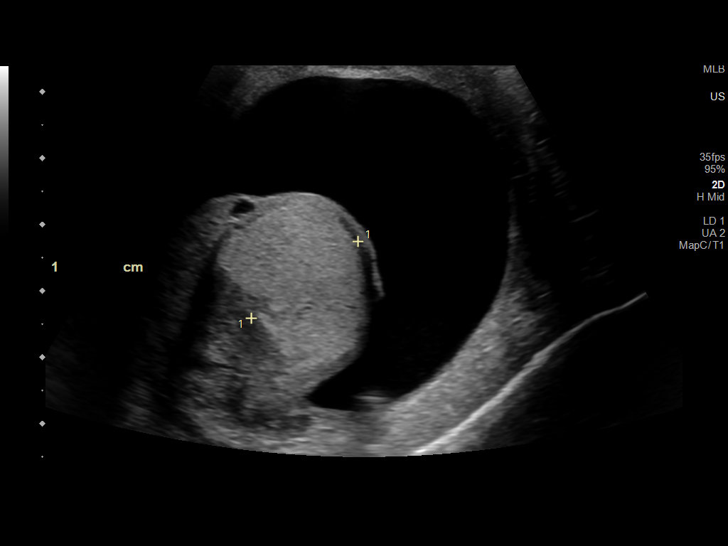
[im 20/59]
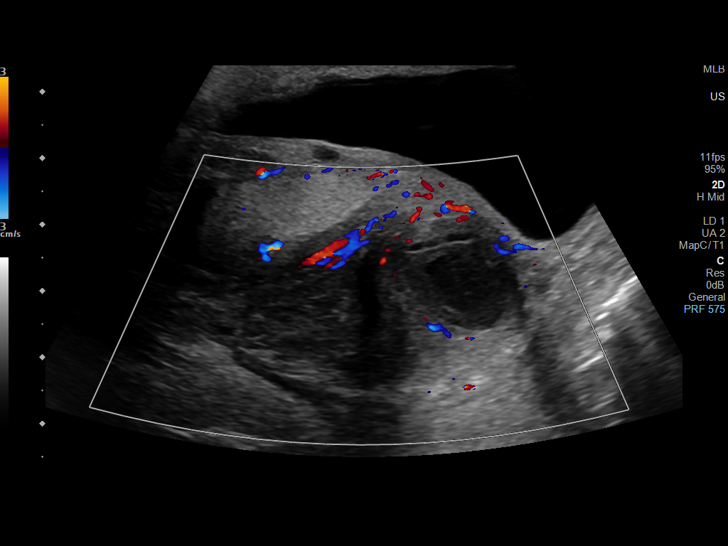
[im 25/59]
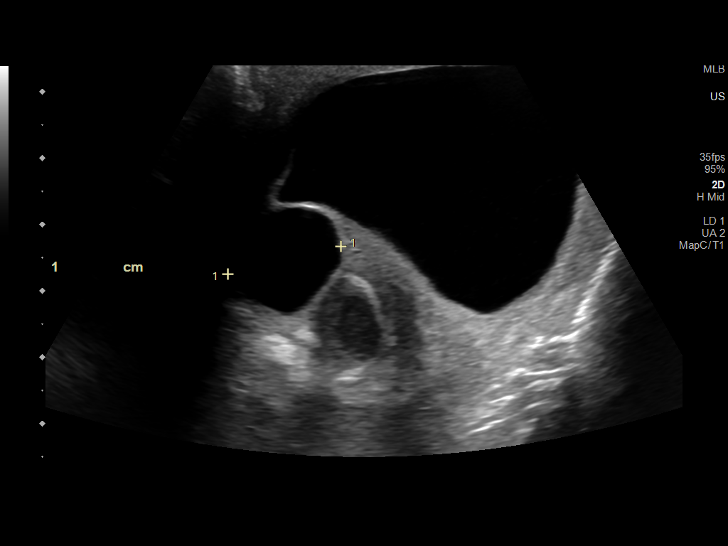
[im 30/59]
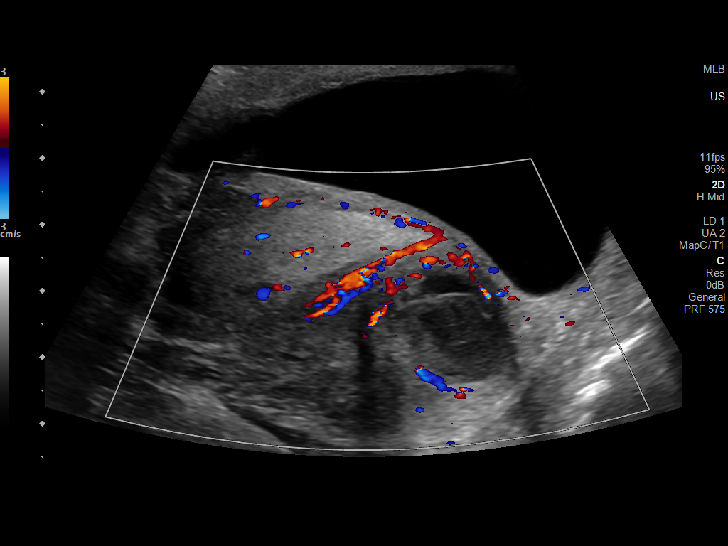
[im 34/59]
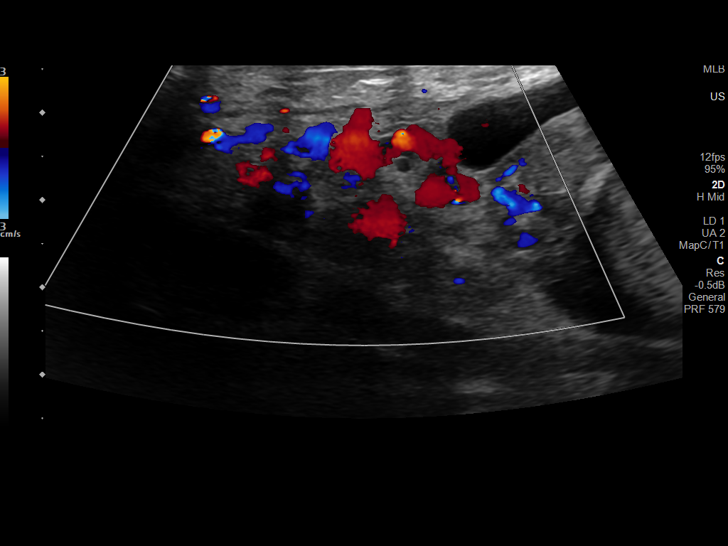
[im 39/59]
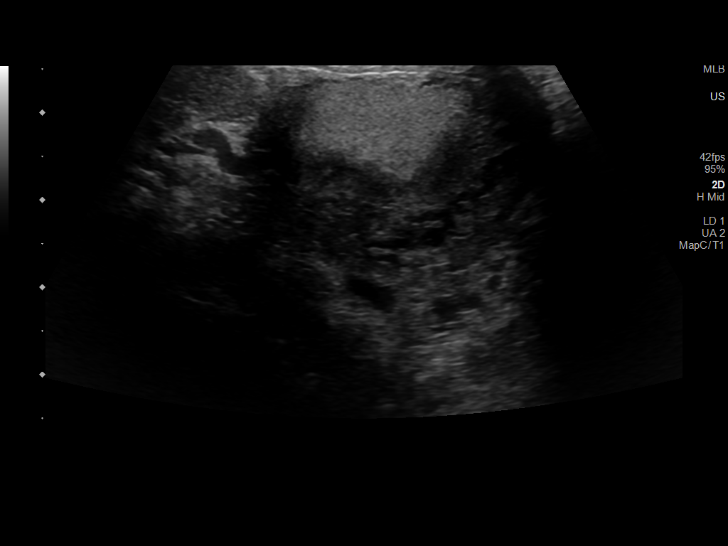
[im 44/59]
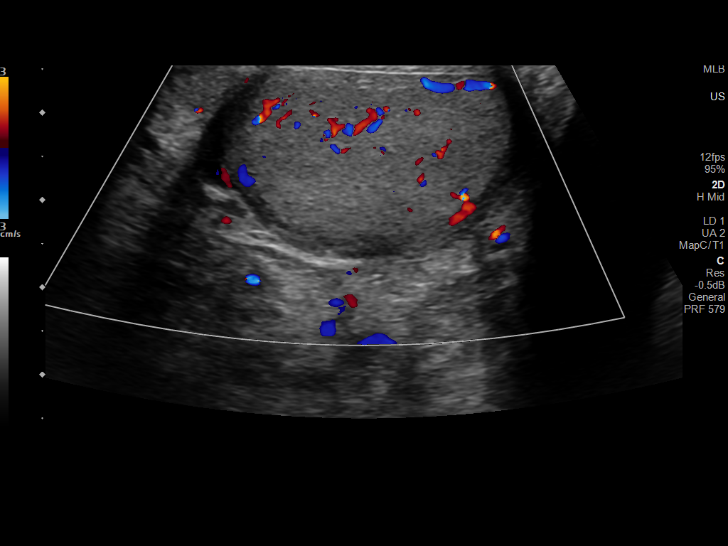
[im 49/59]
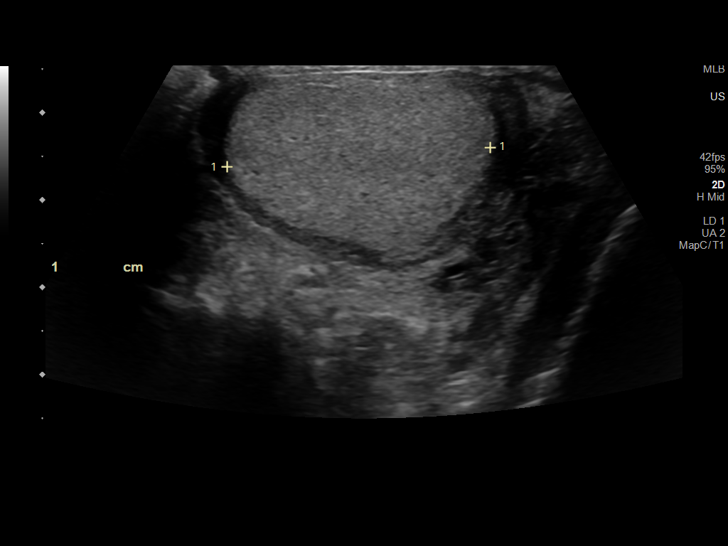
[im 54/59]
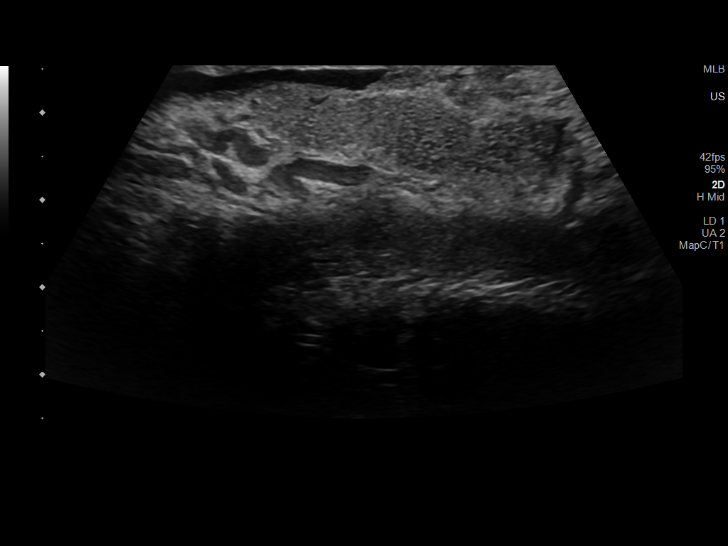
[im 59/59]
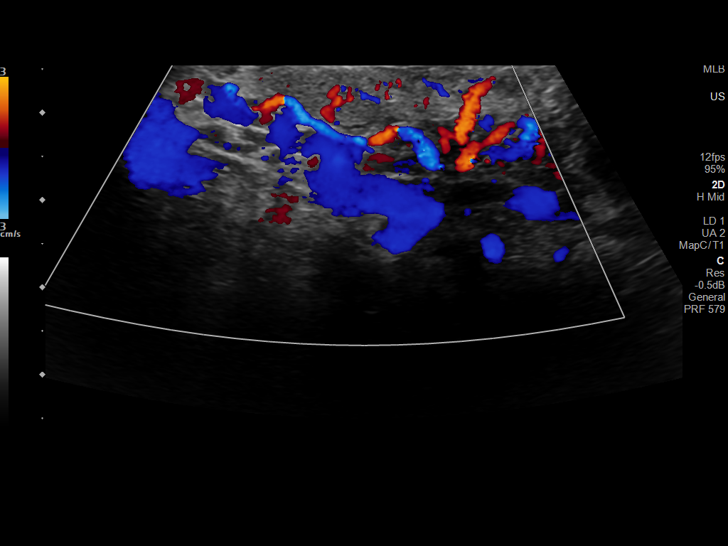

[13 of 25 positions shown; findings below may reference images not displayed]

FINDINGS: Right testicle

Measurements: 3.8 x 2.6 x 2.0 cm. No mass or microlithiasis
visualized.

Left testicle

Measurements: 3.5 x 2.1 x 3.0 cm. No mass or microlithiasis
visualized.

Right epididymis: 1.9 cm cyst is noted within the right epididymis.
Focal 1.7 cm hypoechoic area is noted within the right epididymal
tail.

Left epididymis:  Normal in size and appearance.

Hydrocele:  Large right hydrocele is noted.

Varicocele:  Bilateral varicoceles are seen left worse than right.

Pulsed Doppler interrogation of both testes demonstrates normal low
resistance arterial and venous waveforms bilaterally.
IMPRESSION: Large right hydrocele this corresponds to the scrotal swelling.

Bilateral varicoceles left greater than right.

Right epididymal cyst. Additionally a small hypoechoic lesion is
noted within the right epididymal tail. This may represent a sperm
granuloma or possible chronic changes of previous epididymitis.
Short-term follow-up to assess for stability is recommended.

## 2020-03-24 ENCOUNTER — Ambulatory Visit: Payer: Medicare Other | Admitting: Podiatry

## 2020-03-26 ENCOUNTER — Encounter (HOSPITAL_COMMUNITY): Payer: Medicare Other

## 2020-03-26 ENCOUNTER — Ambulatory Visit: Payer: Medicare Other | Admitting: Vascular Surgery

## 2020-03-26 ENCOUNTER — Ambulatory Visit: Payer: Medicare Other

## 2020-04-15 ENCOUNTER — Ambulatory Visit: Payer: Medicare Other | Admitting: Physician Assistant

## 2020-04-15 ENCOUNTER — Other Ambulatory Visit: Payer: Self-pay

## 2020-04-15 ENCOUNTER — Ambulatory Visit (HOSPITAL_COMMUNITY)
Admission: RE | Admit: 2020-04-15 | Discharge: 2020-04-15 | Disposition: A | Discharge status: Home / Self Care | Payer: Medicare Other | Source: Ambulatory Visit | Attending: Physician Assistant | Admitting: Physician Assistant

## 2020-04-15 ENCOUNTER — Encounter: Payer: Self-pay | Admitting: Student

## 2020-04-15 ENCOUNTER — Telehealth: Payer: Self-pay | Admitting: *Deleted

## 2020-04-15 ENCOUNTER — Ambulatory Visit (INDEPENDENT_AMBULATORY_CARE_PROVIDER_SITE_OTHER)
Admission: RE | Admit: 2020-04-15 | Discharge: 2020-04-15 | Disposition: A | Discharge status: Home / Self Care | Payer: Medicare Other | Source: Ambulatory Visit | Attending: Vascular Surgery | Admitting: Vascular Surgery

## 2020-04-15 ENCOUNTER — Ambulatory Visit (HOSPITAL_COMMUNITY)
Admission: RE | Admit: 2020-04-15 | Discharge: 2020-04-15 | Disposition: A | Discharge status: Home / Self Care | Payer: Medicare Other | Source: Ambulatory Visit | Attending: Internal Medicine | Admitting: Internal Medicine

## 2020-04-15 ENCOUNTER — Ambulatory Visit (INDEPENDENT_AMBULATORY_CARE_PROVIDER_SITE_OTHER): Payer: Medicare Other | Admitting: Student

## 2020-04-15 VITALS — BP 135/91 | HR 92 | Temp 98.1°F | Ht 69.0 in | Wt 240.4 lb

## 2020-04-15 VITALS — BP 153/95 | HR 96 | Temp 97.9°F | Ht 69.0 in | Wt 240.3 lb

## 2020-04-15 DIAGNOSIS — I739 Peripheral vascular disease, unspecified: Secondary | ICD-10-CM

## 2020-04-15 DIAGNOSIS — I10 Essential (primary) hypertension: Secondary | ICD-10-CM | POA: Diagnosis not present

## 2020-04-15 DIAGNOSIS — I872 Venous insufficiency (chronic) (peripheral): Secondary | ICD-10-CM

## 2020-04-15 DIAGNOSIS — I499 Cardiac arrhythmia, unspecified: Secondary | ICD-10-CM

## 2020-04-15 DIAGNOSIS — I4891 Unspecified atrial fibrillation: Secondary | ICD-10-CM

## 2020-04-15 MED ORDER — APIXABAN 5 MG PO TABS: 5.0000 mg | ORAL_TABLET | Freq: Two times a day (BID) | ORAL | 1 refills | Status: DC

## 2020-04-15 MED ORDER — CARVEDILOL 6.25 MG PO TABS: 12.5000 mg | ORAL_TABLET | Freq: Two times a day (BID) | ORAL | 1 refills | Status: DC

## 2020-04-15 NOTE — Assessment & Plan Note (Signed)
Patient presents today after being found to have an irregular heart rhythm on auscultation by vascular and vein specialists. EKG obtained confirmed atrial fibrillation.  Patient reports he has experienced occasional heart palpitations ever since he was little, but has not noticed any increase lately. Denies chest pain. Notes chronic shortness of breath with exertion, noting that he has a difficult time walking up inclines or long distances, but states this has been a gradual development over the last few years. Denies dyspnea at rest. Denies orthopnea or paroxysmal nocturnal dyspnea. States his chronic lower extremity edema is improved from before. Denies recent fevers or chills, nausea/vomiting, indigestion, cough, congestion.  A/P: EKG consistent with atrial fibrillation with RVR (R 107). Patient is well-appearing without signs or symptoms of ACS or CHF. Given that he is in stable atrial fibrillation, will focus on rate control and initiation of anticoagulation given that he will need to be anticoagulated before consideration of rhythm control. He certainly has risk factors for atrial fibrillation, including long-standing HTN and his age. Will obtain an echocardiogram to evaluate for structural heart disease. Recent TSH was wnl. Will obtain CBC and CMP to screen for concomitant deficiencies such as anemia and/or electrolyte abnormalities. - Increase carvedilol from 6.25 mg BID to 12.5 mg BID - Start Eliquis 5 mg twice daily. Provided patient with 43-month coupon and instructed him to call clinic if he has any issues affording this prescription. - Transthoracic echocardiogram - CBC, CMP

## 2020-04-15 NOTE — Assessment & Plan Note (Signed)
Patient evaluated by vascular and vein specialists today who repeated ABIs which were found to be within normal limits. He was recommended to wear his compression socks for his venous insufficiency.

## 2020-04-15 NOTE — Assessment & Plan Note (Signed)
Patient presents after being seen by vascular and vein specialists today. During his visit, ABIs performed were within normal limit with good doppler signals bilaterally. For his venous insufficiency, he was recommended to wear his compression socks.   He was recommended to come to clinic for evaluation of new-onset irregular heart rhythm.   Plan: - Encouraged patient to wear his compression socks - Follow-up with VVS on an as-needed basis

## 2020-04-15 NOTE — Telephone Encounter (Signed)
Agreed, needs urgent evaluation, may end up needing to go to ED but will be seen quicker in our clinic.

## 2020-04-15 NOTE — Assessment & Plan Note (Signed)
Patient presents today for further evaluation and management of his HTN.   BP today:  Blood Pressure 04/15/2020 04/15/2020 03/12/2020 02/13/2020 02/12/2020  BP 135/91 153/95 135/62 175/84 154/71     Current antihypertensive medications: - carvedilol 6.25 mg twice daily - chlorthalidone 25 mg daily  Patient reports his pressures range between the 130s-150s/70s when he checks at home. Denies headache, dizziness, blurry vision.   Plan: - Given his new onset Afib, increasing his carvedilol from 6.25 mg twice daily to 12.5 mg twice daily - Continue chlorthalidone 25 mg daily

## 2020-04-15 NOTE — Progress Notes (Signed)
[facility]           [facility] [place]  History and Physical   Daniel [name] is a 79 y.o. male who was seen by Dr. Donzetta Matters in early October for leg swelling.  Pt has chronic leg swelling of BLE with skin changes.  He has worn compression socks in the past but cannot tolerate them now.  He does not have hx of DVT.    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    When pt saw Dr. Donzetta Matters, he did not have any studies at that time a pt was scheduled for venous reflux study and ABI's and return visit.  He is here today for that visit.   He tells me that he feels his swelling in his legs has gotten a little bit better.  He states that he gets a pins and needles feeling in his feet at times.  He states he can walk about 3 blocks but has to stop at about 1.5 blocks to rest a few seconds and then proceed bc he has cramping in his calves.  He states he gets a little bit of pain in his feet when he elevates them but this improves the longer he has them elevated.    He states that he has occasional fluttering in his chest and he does get a bit short of breath when it happens.  Denies chest pain with this.  He does not have hx of atrial fibrillation.    The pt is not on a statin for cholesterol management.  The pt is on a daily aspirin.   Other AC:  none The pt is on BB for hypertension.   The pt is not diabetic.   Tobacco hx:  former   Past Medical History:  Diagnosis Date  . Allergy   . Arthritis   . Cellulitis 05/07/2019  . Chronic renal insufficiency   . Glaucoma   . Hypertension     Past Surgical History:  Procedure Laterality Date  . COLONOSCOPY    . EYE SURGERY     B cataract surgery. [name].    Social History   Socioeconomic History  . Marital status: Widowed    Spouse name: Not on file  . Number of children: Not on file  . Years of education: Not on file  . Highest education level: Not on  file  Occupational History  . Not on file  Tobacco Use  . Smoking status: Former Smoker    Quit date: 05/15/1968    Years since quitting: 51.9  . Smokeless tobacco: Never Used  Vaping Use  . Vaping Use: Never used  Substance and Sexual Activity  . Alcohol use: Yes    Comment: rarely.  . Drug use: No  . Sexual activity: Not on file  Other Topics Concern  . Not on file  Social History Narrative   Marital status: widowed since 2010; married x 37 years. Not dating in 2018.      Children: 3 daughters; no sons; 7 grandchildren; 2 gg.      Lives: alone in house in neighborhood.     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       Tobacco:  Quit smoking 30 years ago.      Alcohol: weekends. Scotch.      Exercise: sporadic; cuts grass and  trims hedges. Stays busy in yard.        Seatbelt:  100%      Guns: none      ADLs: drives; independent ADLs; buys groceries; pays bills.  Cut grass.  NO cane or assistant devices.      Advanced Directives: yes; No CPR; DNI/DNR.              Social Determinants of Health   Financial Resource Strain:   . Difficulty of Paying Living Expenses: Not on file  Food Insecurity:   . Worried About Charity fundraiser in the Last Year: Not on file  . Ran Out of Food in the Last Year: Not on file  Transportation Needs:   . Lack of Transportation (Medical): Not on file  . Lack of Transportation (Non-Medical): Not on file  Physical Activity:   . Days of Exercise per Week: Not on file  . Minutes of Exercise per Session: Not on file  Stress:   . Feeling of Stress : Not on file  Social Connections:   . Frequency of Communication with Friends and Family: Not on file  . Frequency of Social Gatherings with Friends and Family: Not on file  . Attends Religious Services: Not on file  . Active Member of Clubs or Organizations: Not on file  . Attends Archivist Meetings: Not on file  . Marital Status: Not on file  Intimate  Partner Violence:   . Fear of Current or Ex-Partner: Not on file  . Emotionally Abused: Not on file  . Physically Abused: Not on file  . Sexually Abused: Not on file     Family History  Problem Relation Age of Onset  . Diabetes Mother   . Colon cancer Neg Hx   . Esophageal cancer Neg Hx   . Stomach cancer Neg Hx   . Pancreatic cancer Neg Hx     Current Outpatient Medications  Medication Sig Dispense Refill  . aspirin EC 81 MG tablet Take 1 tablet (81 mg total) by mouth daily. Swallow whole. 30 tablet 5  . carvedilol (COREG) 6.25 MG tablet Take 1 tablet (6.25 mg total) by mouth 2 (two) times daily. 60 tablet 11  . chlorthalidone (HYGROTON) 25 MG tablet Take 1 tablet (25 mg total) by mouth daily. 30 tablet 5  . Vitamin E 400 units TABS Take by mouth. (Patient not taking: Reported on 02/13/2020)     No current facility-administered medications for this visit.    No Known Allergies  REVIEW OF SYSTEMS:   [X]  denotes positive finding, [ ]  denotes negative finding Cardiac  Comments:  Chest pain or chest pressure:    Shortness of breath upon exertion:    Short of breath when lying flat:    Irregular heart rhythm:        Vascular    Pain in calf, thigh, or hip brought on by ambulation: x See HPI  Pain in feet at night that wakes you up from your sleep:     Blood clot in your veins:    Leg swelling:  x       Pulmonary    Oxygen at home:    Productive cough:     Wheezing:         Neurologic    Sudden weakness in arms or legs:     Sudden numbness in arms or legs:     Sudden onset of difficulty speaking or slurred speech:    Temporary loss  of vision in one eye:     Problems with dizziness:         Gastrointestinal    Blood in stool:     Vomited blood:         Genitourinary    Burning when urinating:     Blood in urine:        Psychiatric    Major depression:         Hematologic    Bleeding problems:    Problems with blood clotting too easily:        Skin     Rashes or ulcers:        Constitutional    Fever or chills:      PHYSICAL EXAMINATION:  Today's Vitals   04/15/20 1059  BP: (!) 153/95  Pulse: 96  Temp: 97.9 F (36.6 C)  TempSrc: Skin  SpO2: 94%  Weight: 240 lb 4.8 oz (109 kg)  Height: 5\' 9"  (1.753 m)   Body mass index is 35.49 kg/m.   General:  WDWN in NAD; vital signs documented above Gait: Not observed HENT: WNL, normocephalic Pulmonary: normal non-labored breathing without wheezing Cardiac: irregularly irregular HR Abdomen: soft, obese Skin: without rashes Vascular Exam/Pulses:  Right Left  Radial 2+ (normal) 2+ (normal)  DP Faint AT doppler signal Brisk biphasic doppler signal  PT Brisk monophasic doppler monophasic  doppler signal  Peroneal Brisk monophasic doppler Brisk biphasic doppler   Extremities: BLE edema with skin changes; no ulcerations present Musculoskeletal: no muscle wasting or atrophy  Neurologic: A&O X 3;  moving all extremities equally Psychiatric:  The pt has Normal affect.   Non-Invasive Vascular Imaging:   Venous duplex on 04/15/2020: Summary:  Right:  - No evidence of deep vein thrombosis from the common femoral through the  popliteal veins.  - No evidence of superficial venous thrombosis.  - The deep venous system is not competent.  - The great saphenous vein is incompetent only at the level of the knee.  - The small saphenous vein is competent.  - The anterior accessory branch is not competent.    Left:  - No evidence of deep vein thrombosis from the common femoral through the  popliteal veins.  - No evidence of superficial venous thrombosis.  - The common femoral vein is not competent.  - The great saphenous vein is not competent in the proximal thigh.  - The small saphenous vein is competent.   ABI/TBI 04/15/2020: Right:  0.96/0.63 ; Great toe pressure 97 Left:  1.01/0.61 ; Great toe pressure 94   Daniel [name] is a 79 y.o. male who presents with: BLE  edema  Venous Insufficiency -pt with deep venous reflux bilaterally as well as the GSV at the knee and anterior accessory on the right and GSV in the proximal thigh on the left.  The diameter of the vein is not suitable for laser ablation.   -discussed with pt to wear his compression socks - he was putting these on at night and it was very difficult.  I discussed putting these on in the morning before his feet hit the floor and take them off at night. -also discussed proper elevation technique as well as weight loss and hand out given.   PAD -pt ABI's today are in the normal range.  He has good doppler signals bilaterally.    Irregular heartbeat- ? Atrial fibrillation -pt with irregularly irregular rhythm on auscultation with two providers.  He does have some fluttering in  his chest and mild shortness of breath with this.  He has felt this in the past but no documentation of afib.   -we called his PCP to see if he could get in for EKG and be seen this afternoon, but they could not see him until Monday.  We recommended that he go to Urgent Care for EKG.  I discussed afib with he and his daughter and if he is in afib, he would be at increased risk for stroke and may need to be on anticoagulation.  He and his daughter are proceeding to urgent care at this time.  -pt will f/u with [facility] as needed.    Leontine Locket, [facility] 04/15/2020 10:42 AM  Clinic MD:  Oneida Alar

## 2020-04-15 NOTE — Patient Instructions (Addendum)
Daniel [name],   Thank you for your visit to the [facility] today. It was a pleasure seeing you. Today we discussed the following:  1) Atrial fibrillation: You have an abnormal heart rhythm called atrial fibrillation, or Afib. In order to keep your heart rate controlled, we are recommending you increase your carvedilol to 12.5 mg twice daily. Also, we are starting you on a blood thinner called Eliquis 5 mg twice daily. Use the coupon I gave you when you pick up the prescription from the pharmacy. If for some reason it doesn't work, give our office a call. In order to understand why your heart may be in this rhythm, we are going to get a scan of your heart called an echocardiogram. We are also getting some blood work today. Someone will call you to schedule the echocardiogram, and I will call you with the results of your blood work.  See below for more information.  We would like to see you back in the New Year, in ~1 month. Please bring all of your medications with you.   If you have any questions or concerns, please call our clinic at [phone] between 9am-5pm. Outside of these hours, call [phone] and ask for the internal medicine resident on call. If you feel you are having a medical emergency please call 911.     Atrial Fibrillation  Atrial fibrillation is a type of heartbeat that is irregular or fast. If you have this condition, your heart beats without any order. This makes it hard for your heart to pump blood in a normal way. Atrial fibrillation may come and go, or it may become a long-lasting problem. If this condition is not treated, it can put you at higher risk for stroke, heart failure, and other heart problems. What are the causes? This condition may be caused by diseases that damage the heart. They include:  High blood pressure.  Heart failure.  Heart valve disease.  Heart surgery. Other causes include:  Diabetes.  Thyroid disease.  Being  overweight.  Kidney disease. Sometimes the cause is not known. What increases the risk? You are more likely to develop this condition if:  You are older.  You smoke.  You exercise often and very hard.  You have a family history of this condition.  You are a man.  You use drugs.  You drink a lot of alcohol.  You have lung conditions, such as emphysema, pneumonia, or COPD.  You have sleep apnea. What are the signs or symptoms? Common symptoms of this condition include:  A feeling that your heart is beating very fast.  Chest pain or discomfort.  Feeling short of breath.  Suddenly feeling light-headed or weak.  Getting tired easily during activity.  Fainting.  Sweating. In some cases, there are no symptoms. How is this treated? Treatment for this condition depends on underlying conditions and how you feel when you have atrial fibrillation. They include:  Medicines to: ? Prevent blood clots. ? Treat heart rate or heart rhythm problems.  Using devices, such as a pacemaker, to correct heart rhythm problems.  Doing surgery to remove the part of the heart that sends bad signals.  Closing an area where clots can form in the heart (left atrial appendage). In some cases, your doctor will treat other underlying conditions. Follow these instructions at home: Medicines  Take over-the-counter and prescription medicines only as told by your doctor.  Do not take any new medicines without first talking  to your doctor.  If you are taking blood thinners: ? Talk with your doctor before you take any medicines that have aspirin or NSAIDs, such as ibuprofen, in them. ? Take your medicine exactly as told by your doctor. Take it at the same time each day. ? Avoid activities that could hurt or bruise you. Follow instructions about how to prevent falls. ? Wear a bracelet that says you are taking blood thinners. Or, carry a card that lists what medicines you take. Lifestyle       Do not use any products that have nicotine or tobacco in them. These include cigarettes, e-cigarettes, and chewing tobacco. If you need help quitting, ask your doctor.  Eat heart-healthy foods. Talk with your doctor about the right eating plan for you.  Exercise regularly as told by your doctor.  Do not drink alcohol.  Lose weight if you are overweight.  Do not use drugs, including cannabis. General instructions  If you have a condition that causes breathing to stop for a short period of time (apnea), treat it as told by your doctor.  Keep a healthy weight. Do not use diet pills unless your doctor says they are safe for you. Diet pills may make heart problems worse.  Keep all follow-up visits as told by your doctor. This is important. Contact a doctor if:  You notice a change in the speed, rhythm, or strength of your heartbeat.  You are taking a blood-thinning medicine and you get more bruising.  You get tired more easily when you move or exercise.  You have a sudden change in weight. Get help right away if:   You have pain in your chest or your belly (abdomen).  You have trouble breathing.  You have side effects of blood thinners, such as blood in your vomit, poop (stool), or pee (urine), or bleeding that cannot stop.  You have any signs of a stroke. "BE FAST" is an easy way to remember the main warning signs: ? B - Balance. Signs are dizziness, sudden trouble walking, or loss of balance. ? E - Eyes. Signs are trouble seeing or a change in how you see. ? F - Face. Signs are sudden weakness or loss of feeling in the face, or the face or eyelid drooping on one side. ? A - Arms. Signs are weakness or loss of feeling in an arm. This happens suddenly and usually on one side of the body. ? S - Speech. Signs are sudden trouble speaking, slurred speech, or trouble understanding what people say. ? T - Time. Time to call emergency services. Write down what time symptoms  started.  You have other signs of a stroke, such as: ? A sudden, very bad headache with no known cause. ? Feeling like you may vomit (nausea). ? Vomiting. ? A seizure. These symptoms may be an emergency. Do not wait to see if the symptoms will go away. Get medical help right away. Call your local emergency services (911 in the [place] Do not drive yourself to the hospital. Summary  Atrial fibrillation is a type of heartbeat that is irregular or fast.  You are at higher risk of this condition if you smoke, are older, have diabetes, or are overweight.  Follow your doctor's instructions about medicines, diet, exercise, and follow-up visits.  Get help right away if you have signs or symptoms of a stroke.  Get help right away if you cannot catch your breath, or you have chest pain or discomfort.  This information is not intended to replace advice given to you by your health care provider. Make sure you discuss any questions you have with your health care provider. Document Revised: 10/23/2018 Document Reviewed: 10/23/2018 Elsevier Patient Education  [place].

## 2020-04-15 NOTE — Progress Notes (Signed)
   CC: irregular heart rhythm  HPI:  [name] is a 79 y.o. person with history as below who presents to clinic after an irregular heart rhythm was detected during a vascular appointment today. His last clinic visit was on 03/12/20 with Dr. Marianna Payment.   To see the details of this patient's management of their acute and chronic problems, please refer to the Assessment & Plan under the Encounters tab.    Past Medical History:  Diagnosis Date  . Allergy   . Arthritis   . Cellulitis 05/07/2019  . Chronic renal insufficiency   . Glaucoma   . Hypertension    Review of Systems:    Review of Systems  Constitutional: Negative for chills, fever, malaise/fatigue and weight loss.  HENT: Negative for congestion, sinus pain and sore throat.   Eyes: Negative for blurred vision.  Respiratory: Positive for cough. Negative for hemoptysis, shortness of breath and wheezing.   Cardiovascular: Positive for palpitations and leg swelling. Negative for chest pain, orthopnea and PND.  Gastrointestinal: Negative for abdominal pain, diarrhea, nausea and vomiting.  Genitourinary: Negative for dysuria, frequency and urgency.  Musculoskeletal: Negative for falls and myalgias.  Skin: Negative for rash.  Neurological: Negative for dizziness and weakness.  Endo/Heme/Allergies: Does not bruise/bleed easily.    Physical Exam:  There were no vitals filed for this visit. Constitutional: very pleasant and well-appearing gentleman sitting in chair, in no acute distress HENT: normocephalic atraumatic, mucous membranes moist Eyes: conjunctiva non-erythematous Neck: supple Cardiovascular: regular rate, irregularly irregular rhythm, no m/r/g; 1+ nonpitting lower extremity edema with chronic venous stasis changes; no JVD Pulmonary/Chest: normal work of breathing on room air, mild crackles over bilateral lung bases otherwise clear, no wheezes rales or rhonchi Abdominal: tense, non-tender, distended (per patient,  his baseline) MSK: normal bulk and tone Neurological: alert & oriented x 3, gait is slow and slightly antalgic  Skin: warm and dry    Assessment & Plan:   See Encounters Tab for problem based charting.  Patient seen with Dr. Jimmye Norman

## 2020-04-15 NOTE — Telephone Encounter (Signed)
Received call from [facility] requesting appt today for patient as they think he is in A fib. States patient has a irregular, irregular heart rate. They do not have an EKG machine and patient does not have a cardiologist. Spoke with Attending who ok'd appt here. Placed on Blue Team's schedule as Red Team is full. Hubbard Hartshorn, BSN, RN-BC

## 2020-04-16 LAB — CBC WITH DIFFERENTIAL/PLATELET
Basophils Absolute: 0 10*3/uL (ref 0.0–0.2)
Basos: 0 %
EOS (ABSOLUTE): 0.2 10*3/uL (ref 0.0–0.4)
Eos: 2 %
Hematocrit: 40.9 % (ref 37.5–51.0)
Hemoglobin: 13.3 g/dL (ref 13.0–17.7)
Immature Grans (Abs): 0 10*3/uL (ref 0.0–0.1)
Immature Granulocytes: 0 %
Lymphocytes Absolute: 1.9 10*3/uL (ref 0.7–3.1)
Lymphs: 21 %
MCH: 27.1 pg (ref 26.6–33.0)
MCHC: 32.5 g/dL (ref 31.5–35.7)
MCV: 83 fL (ref 79–97)
Monocytes Absolute: 0.9 10*3/uL (ref 0.1–0.9)
Monocytes: 10 %
Neutrophils Absolute: 6.1 10*3/uL (ref 1.4–7.0)
Neutrophils: 67 %
Platelets: 191 10*3/uL (ref 150–450)
RBC: 4.91 x10E6/uL (ref 4.14–5.80)
RDW: 13.8 % (ref 11.6–15.4)
WBC: 9.1 10*3/uL (ref 3.4–10.8)

## 2020-04-16 LAB — CMP14 + ANION GAP
ALT: 45 IU/L — ABNORMAL HIGH (ref 0–44)
AST: 30 IU/L (ref 0–40)
Albumin/Globulin Ratio: 1.1 — ABNORMAL LOW (ref 1.2–2.2)
Albumin: 4.1 g/dL (ref 3.7–4.7)
Alkaline Phosphatase: 69 IU/L (ref 44–121)
Anion Gap: 15 mmol/L (ref 10.0–18.0)
BUN/Creatinine Ratio: 12 (ref 10–24)
BUN: 16 mg/dL (ref 8–27)
Bilirubin Total: 0.9 mg/dL (ref 0.0–1.2)
CO2: 29 mmol/L (ref 20–29)
Calcium: 9.1 mg/dL (ref 8.6–10.2)
Chloride: 100 mmol/L (ref 96–106)
Creatinine, Ser: 1.29 mg/dL — ABNORMAL HIGH (ref 0.76–1.27)
GFR calc Af Amer: 61 mL/min/{1.73_m2} (ref >59)
GFR calc non Af Amer: 53 mL/min/{1.73_m2} — ABNORMAL LOW (ref >59)
Globulin, Total: 3.9 g/dL (ref 1.5–4.5)
Glucose: 98 mg/dL (ref 65–99)
Potassium: 4.2 mmol/L (ref 3.5–5.2)
Sodium: 144 mmol/L (ref 134–144)
Total Protein: 8 g/dL (ref 6.0–8.5)

## 2020-04-16 NOTE — Progress Notes (Signed)
Internal Medicine Clinic Attending  I saw and evaluated the patient.  I personally confirmed the key portions of the history and exam documented by Dr. Watson and I reviewed pertinent patient test results.  The assessment, diagnosis, and plan were formulated together and I agree with the documentation in the resident's note.  

## 2020-05-11 ENCOUNTER — Telehealth: Payer: Self-pay

## 2020-05-11 ENCOUNTER — Other Ambulatory Visit: Payer: Self-pay | Admitting: *Deleted

## 2020-05-11 MED ORDER — CHLORTHALIDONE 25 MG PO TABS
25.0000 mg | ORAL_TABLET | Freq: Every day | ORAL | 5 refills | Status: DC
Start: 2020-05-11 — End: 2020-11-01

## 2020-05-11 NOTE — Telephone Encounter (Signed)
Pt daughter called asking for clarification on his medication , she can be reached at [phone]

## 2020-05-11 NOTE — Telephone Encounter (Signed)
Addressed in new encounter.

## 2020-05-24 ENCOUNTER — Emergency Department (HOSPITAL_COMMUNITY): Payer: Medicare Other | Admitting: Anesthesiology

## 2020-05-24 ENCOUNTER — Emergency Department (HOSPITAL_COMMUNITY): Payer: Medicare Other

## 2020-05-24 ENCOUNTER — Inpatient Hospital Stay (HOSPITAL_COMMUNITY)
Admission: EM | Admit: 2020-05-24 | Discharge: 2020-06-28 | DRG: 003 | Disposition: A | Discharge status: Other Acute Inpatient Hospital | Payer: Medicare Other | Attending: Surgery | Admitting: Surgery

## 2020-05-24 ENCOUNTER — Encounter (HOSPITAL_COMMUNITY): Payer: Self-pay | Admitting: Student in an Organized Health Care Education/Training Program

## 2020-05-24 ENCOUNTER — Encounter (HOSPITAL_COMMUNITY)
Admission: EM | Disposition: A | Discharge status: Nursing Home (Includes ICF) | Payer: Self-pay | Source: Home / Self Care

## 2020-05-24 ENCOUNTER — Inpatient Hospital Stay (HOSPITAL_COMMUNITY): Payer: Medicare Other

## 2020-05-24 DIAGNOSIS — K567 Ileus, unspecified: Secondary | ICD-10-CM | POA: Diagnosis not present

## 2020-05-24 DIAGNOSIS — S1190XA Unspecified open wound of unspecified part of neck, initial encounter: Secondary | ICD-10-CM | POA: Diagnosis not present

## 2020-05-24 DIAGNOSIS — I462 Cardiac arrest due to underlying cardiac condition: Secondary | ICD-10-CM | POA: Diagnosis not present

## 2020-05-24 DIAGNOSIS — S0083XA Contusion of other part of head, initial encounter: Secondary | ICD-10-CM | POA: Diagnosis not present

## 2020-05-24 DIAGNOSIS — T17400A Unspecified foreign body in trachea causing asphyxiation, initial encounter: Secondary | ICD-10-CM | POA: Diagnosis not present

## 2020-05-24 DIAGNOSIS — Y249XXA Unspecified firearm discharge, undetermined intent, initial encounter: Secondary | ICD-10-CM

## 2020-05-24 DIAGNOSIS — Z4659 Encounter for fitting and adjustment of other gastrointestinal appliance and device: Secondary | ICD-10-CM

## 2020-05-24 DIAGNOSIS — S31000A Unspecified open wound of lower back and pelvis without penetration into retroperitoneum, initial encounter: Secondary | ICD-10-CM | POA: Diagnosis not present

## 2020-05-24 DIAGNOSIS — Z930 Tracheostomy status: Secondary | ICD-10-CM | POA: Diagnosis not present

## 2020-05-24 DIAGNOSIS — D62 Acute posthemorrhagic anemia: Secondary | ICD-10-CM | POA: Diagnosis not present

## 2020-05-24 DIAGNOSIS — T17408A Unspecified foreign body in trachea causing other injury, initial encounter: Secondary | ICD-10-CM

## 2020-05-24 DIAGNOSIS — Z4682 Encounter for fitting and adjustment of non-vascular catheter: Secondary | ICD-10-CM | POA: Diagnosis not present

## 2020-05-24 DIAGNOSIS — R918 Other nonspecific abnormal finding of lung field: Secondary | ICD-10-CM | POA: Diagnosis not present

## 2020-05-24 DIAGNOSIS — I10 Essential (primary) hypertension: Secondary | ICD-10-CM | POA: Diagnosis not present

## 2020-05-24 DIAGNOSIS — R739 Hyperglycemia, unspecified: Secondary | ICD-10-CM | POA: Diagnosis present

## 2020-05-24 DIAGNOSIS — S02601A Fracture of unspecified part of body of right mandible, initial encounter for closed fracture: Principal | ICD-10-CM | POA: Diagnosis present

## 2020-05-24 DIAGNOSIS — J9811 Atelectasis: Secondary | ICD-10-CM | POA: Diagnosis not present

## 2020-05-24 DIAGNOSIS — R578 Other shock: Secondary | ICD-10-CM | POA: Diagnosis not present

## 2020-05-24 DIAGNOSIS — Z9889 Other specified postprocedural states: Secondary | ICD-10-CM

## 2020-05-24 DIAGNOSIS — J9600 Acute respiratory failure, unspecified whether with hypoxia or hypercapnia: Secondary | ICD-10-CM | POA: Diagnosis not present

## 2020-05-24 DIAGNOSIS — J9621 Acute and chronic respiratory failure with hypoxia: Secondary | ICD-10-CM | POA: Diagnosis not present

## 2020-05-24 DIAGNOSIS — S02601B Fracture of unspecified part of body of right mandible, initial encounter for open fracture: Secondary | ICD-10-CM

## 2020-05-24 DIAGNOSIS — R1310 Dysphagia, unspecified: Secondary | ICD-10-CM | POA: Diagnosis present

## 2020-05-24 DIAGNOSIS — L8990 Pressure ulcer of unspecified site, unspecified stage: Secondary | ICD-10-CM | POA: Insufficient documentation

## 2020-05-24 DIAGNOSIS — I469 Cardiac arrest, cause unspecified: Secondary | ICD-10-CM

## 2020-05-24 DIAGNOSIS — W3400XA Accidental discharge from unspecified firearms or gun, initial encounter: Secondary | ICD-10-CM | POA: Diagnosis not present

## 2020-05-24 DIAGNOSIS — E870 Hyperosmolality and hypernatremia: Secondary | ICD-10-CM | POA: Diagnosis not present

## 2020-05-24 DIAGNOSIS — J988 Other specified respiratory disorders: Secondary | ICD-10-CM | POA: Diagnosis not present

## 2020-05-24 DIAGNOSIS — R0902 Hypoxemia: Secondary | ICD-10-CM

## 2020-05-24 DIAGNOSIS — T797XXA Traumatic subcutaneous emphysema, initial encounter: Secondary | ICD-10-CM | POA: Diagnosis not present

## 2020-05-24 DIAGNOSIS — E876 Hypokalemia: Secondary | ICD-10-CM | POA: Diagnosis not present

## 2020-05-24 DIAGNOSIS — K6389 Other specified diseases of intestine: Secondary | ICD-10-CM | POA: Diagnosis not present

## 2020-05-24 DIAGNOSIS — S0193XA Puncture wound without foreign body of unspecified part of head, initial encounter: Secondary | ICD-10-CM | POA: Diagnosis not present

## 2020-05-24 DIAGNOSIS — I4891 Unspecified atrial fibrillation: Secondary | ICD-10-CM | POA: Diagnosis not present

## 2020-05-24 DIAGNOSIS — M47816 Spondylosis without myelopathy or radiculopathy, lumbar region: Secondary | ICD-10-CM | POA: Diagnosis not present

## 2020-05-24 DIAGNOSIS — Z20822 Contact with and (suspected) exposure to covid-19: Secondary | ICD-10-CM | POA: Diagnosis not present

## 2020-05-24 DIAGNOSIS — J151 Pneumonia due to Pseudomonas: Secondary | ICD-10-CM | POA: Diagnosis not present

## 2020-05-24 DIAGNOSIS — Z0389 Encounter for observation for other suspected diseases and conditions ruled out: Secondary | ICD-10-CM | POA: Diagnosis not present

## 2020-05-24 DIAGNOSIS — R140 Abdominal distension (gaseous): Secondary | ICD-10-CM

## 2020-05-24 DIAGNOSIS — S02609B Fracture of mandible, unspecified, initial encounter for open fracture: Secondary | ICD-10-CM | POA: Diagnosis not present

## 2020-05-24 DIAGNOSIS — J9690 Respiratory failure, unspecified, unspecified whether with hypoxia or hypercapnia: Secondary | ICD-10-CM

## 2020-05-24 DIAGNOSIS — T8182XA Emphysema (subcutaneous) resulting from a procedure, initial encounter: Secondary | ICD-10-CM | POA: Diagnosis not present

## 2020-05-24 DIAGNOSIS — R001 Bradycardia, unspecified: Secondary | ICD-10-CM | POA: Diagnosis not present

## 2020-05-24 DIAGNOSIS — I482 Chronic atrial fibrillation, unspecified: Secondary | ICD-10-CM | POA: Diagnosis present

## 2020-05-24 DIAGNOSIS — T1490XA Injury, unspecified, initial encounter: Secondary | ICD-10-CM

## 2020-05-24 DIAGNOSIS — I517 Cardiomegaly: Secondary | ICD-10-CM | POA: Diagnosis not present

## 2020-05-24 DIAGNOSIS — J9601 Acute respiratory failure with hypoxia: Secondary | ICD-10-CM | POA: Diagnosis not present

## 2020-05-24 DIAGNOSIS — J90 Pleural effusion, not elsewhere classified: Secondary | ICD-10-CM | POA: Diagnosis present

## 2020-05-24 DIAGNOSIS — K317 Polyp of stomach and duodenum: Secondary | ICD-10-CM | POA: Diagnosis present

## 2020-05-24 DIAGNOSIS — J189 Pneumonia, unspecified organism: Secondary | ICD-10-CM | POA: Diagnosis not present

## 2020-05-24 DIAGNOSIS — J811 Chronic pulmonary edema: Secondary | ICD-10-CM | POA: Diagnosis not present

## 2020-05-24 DIAGNOSIS — R109 Unspecified abdominal pain: Secondary | ICD-10-CM | POA: Diagnosis not present

## 2020-05-24 DIAGNOSIS — I959 Hypotension, unspecified: Secondary | ICD-10-CM | POA: Diagnosis not present

## 2020-05-24 HISTORY — DX: Essential (primary) hypertension: I10

## 2020-05-24 HISTORY — PX: ESOPHAGOGASTRODUODENOSCOPY: SHX5428

## 2020-05-24 HISTORY — PX: TRACHEOSTOMY TUBE PLACEMENT: SHX814

## 2020-05-24 LAB — COMPREHENSIVE METABOLIC PANEL
ALT: 22 U/L (ref 0–44)
AST: 23 U/L (ref 15–41)
Albumin: 3.4 g/dL — ABNORMAL LOW (ref 3.5–5.0)
Alkaline Phosphatase: 63 U/L (ref 38–126)
Anion gap: 11 (ref 5–15)
BUN: 18 mg/dL (ref 8–23)
CO2: 31 mmol/L (ref 22–32)
Calcium: 9.4 mg/dL (ref 8.9–10.3)
Chloride: 99 mmol/L (ref 98–111)
Creatinine, Ser: 1.32 mg/dL — ABNORMAL HIGH (ref 0.61–1.24)
GFR, Estimated: 55 mL/min — ABNORMAL LOW (ref >60)
Glucose, Bld: 131 mg/dL — ABNORMAL HIGH (ref 70–99)
Potassium: 3.7 mmol/L (ref 3.5–5.1)
Sodium: 141 mmol/L (ref 135–145)
Total Bilirubin: 0.6 mg/dL (ref 0.3–1.2)
Total Protein: 8.1 g/dL (ref 6.5–8.1)

## 2020-05-24 LAB — CBC
HCT: 44.6 % (ref 39.0–52.0)
Hemoglobin: 13.6 g/dL (ref 13.0–17.0)
MCH: 26.8 pg (ref 26.0–34.0)
MCHC: 30.5 g/dL (ref 30.0–36.0)
MCV: 87.8 fL (ref 80.0–100.0)
Platelets: 234 10*3/uL (ref 150–400)
RBC: 5.08 MIL/uL (ref 4.22–5.81)
RDW: 15.4 % (ref 11.5–15.5)
WBC: 14.6 10*3/uL — ABNORMAL HIGH (ref 4.0–10.5)
nRBC: 0 % (ref 0.0–0.2)

## 2020-05-24 LAB — RESP PANEL BY RT-PCR (FLU A&B, COVID) ARPGX2
Influenza A by PCR: NEGATIVE
Influenza B by PCR: NEGATIVE
SARS Coronavirus 2 by RT PCR: NEGATIVE

## 2020-05-24 LAB — I-STAT CHEM 8, ED
BUN: 20 mg/dL (ref 8–23)
Calcium, Ion: 1.18 mmol/L (ref 1.15–1.40)
Chloride: 95 mmol/L — ABNORMAL LOW (ref 98–111)
Creatinine, Ser: 1.3 mg/dL — ABNORMAL HIGH (ref 0.61–1.24)
Glucose, Bld: 132 mg/dL — ABNORMAL HIGH (ref 70–99)
HCT: 45 % (ref 39.0–52.0)
Hemoglobin: 15.3 g/dL (ref 13.0–17.0)
Potassium: 3.6 mmol/L (ref 3.5–5.1)
Sodium: 144 mmol/L (ref 135–145)
TCO2: 33 mmol/L — ABNORMAL HIGH (ref 22–32)

## 2020-05-24 LAB — SAMPLE TO BLOOD BANK

## 2020-05-24 LAB — ETHANOL: Alcohol, Ethyl (B): <10 mg/dL (ref <10)

## 2020-05-24 LAB — LACTIC ACID, PLASMA: Lactic Acid, Venous: 2.1 mmol/L (ref 0.5–1.9)

## 2020-05-24 LAB — PREPARE RBC (CROSSMATCH)

## 2020-05-24 LAB — PROTIME-INR
INR: 1.2 (ref 0.8–1.2)
Prothrombin Time: 14.6 seconds (ref 11.4–15.2)

## 2020-05-24 SURGERY — EGD (ESOPHAGOGASTRODUODENOSCOPY)
Anesthesia: Moderate Sedation

## 2020-05-24 SURGERY — CREATION, TRACHEOSTOMY
Anesthesia: General | Site: Neck

## 2020-05-24 MED ORDER — FENTANYL CITRATE (PF) 250 MCG/5ML IJ SOLN
INTRAMUSCULAR | Status: AC
  Filled 2020-05-24: qty 5

## 2020-05-24 MED ORDER — MIDAZOLAM HCL 2 MG/2ML IJ SOLN
INTRAMUSCULAR | Status: AC
  Filled 2020-05-24: qty 2

## 2020-05-24 MED ORDER — LACTATED RINGERS IV BOLUS
1000.0000 mL | Freq: Once | INTRAVENOUS | Status: AC
  Administered 2020-05-24: 1000 mL via INTRAVENOUS

## 2020-05-24 MED ORDER — PROPOFOL 1000 MG/100ML IV EMUL
0.0000 ug/kg/min | INTRAVENOUS | Status: DC
  Administered 2020-05-24: 30 ug/kg/min via INTRAVENOUS
  Administered 2020-05-25: 25 ug/kg/min via INTRAVENOUS
  Administered 2020-05-25: 20 ug/kg/min via INTRAVENOUS
  Administered 2020-05-26: 10 ug/kg/min via INTRAVENOUS
  Filled 2020-05-24 (×3): qty 100

## 2020-05-24 MED ORDER — PROTHROMBIN COMPLEX CONC HUMAN 500 UNITS IV KIT
2609.0000 [IU] | PACK | Status: AC
  Administered 2020-05-24: 2609 [IU] via INTRAVENOUS
  Filled 2020-05-24: qty 2109

## 2020-05-24 MED ORDER — POLYETHYLENE GLYCOL 3350 17 G PO PACK
17.0000 g | PACK | Freq: Every day | ORAL | Status: DC
  Administered 2020-05-25 – 2020-06-05 (×11): 17 g
  Filled 2020-05-24 (×11): qty 1

## 2020-05-24 MED ORDER — ONDANSETRON 4 MG PO TBDP
4.0000 mg | ORAL_TABLET | Freq: Four times a day (QID) | ORAL | Status: DC | PRN
  Filled 2020-05-24 (×2): qty 1

## 2020-05-24 MED ORDER — LIDOCAINE-EPINEPHRINE 1.5 %-1:200,000 OPTIME - NO CHARGE
INTRAMUSCULAR | Status: DC | PRN
  Administered 2020-05-24: 2 mL via SUBCUTANEOUS

## 2020-05-24 MED ORDER — MIDAZOLAM HCL 5 MG/5ML IJ SOLN
INTRAMUSCULAR | Status: DC | PRN
  Administered 2020-05-24 (×2): 1 mg via INTRAVENOUS

## 2020-05-24 MED ORDER — IOHEXOL 350 MG/ML SOLN
75.0000 mL | Freq: Once | INTRAVENOUS | Status: AC | PRN
  Administered 2020-05-24: 75 mL via INTRAVENOUS

## 2020-05-24 MED ORDER — SODIUM CHLORIDE 0.9% IV SOLUTION: Freq: Once | INTRAVENOUS | Status: AC

## 2020-05-24 MED ORDER — FENTANYL CITRATE (PF) 100 MCG/2ML IJ SOLN: 25.0000 ug | Freq: Once | INTRAMUSCULAR | Status: DC

## 2020-05-24 MED ORDER — ACETAMINOPHEN 500 MG PO TABS
1000.0000 mg | ORAL_TABLET | Freq: Four times a day (QID) | ORAL | Status: DC
  Administered 2020-05-25 – 2020-06-28 (×129): 1000 mg
  Filled 2020-05-24 (×131): qty 2

## 2020-05-24 MED ORDER — METHOCARBAMOL 1000 MG/10ML IJ SOLN
1000.0000 mg | Freq: Three times a day (TID) | INTRAVENOUS | Status: DC
  Administered 2020-05-25 – 2020-06-05 (×33): 1000 mg via INTRAVENOUS
  Filled 2020-05-24 (×39): qty 10

## 2020-05-24 MED ORDER — OXYCODONE HCL 5 MG/5ML PO SOLN
5.0000 mg | ORAL | Status: DC | PRN
  Administered 2020-05-25 – 2020-05-28 (×7): 10 mg
  Administered 2020-05-28: 5 mg
  Administered 2020-05-29 – 2020-06-03 (×8): 10 mg
  Administered 2020-06-03: 5 mg
  Filled 2020-05-24 (×4): qty 10
  Filled 2020-05-24: qty 5
  Filled 2020-05-24 (×4): qty 10
  Filled 2020-05-24: qty 5
  Filled 2020-05-24 (×6): qty 10
  Filled 2020-05-24: qty 5
  Filled 2020-05-24 (×2): qty 10

## 2020-05-24 MED ORDER — CEFAZOLIN SODIUM-DEXTROSE 2-3 GM-%(50ML) IV SOLR
INTRAVENOUS | Status: DC | PRN
  Administered 2020-05-24: 2 g via INTRAVENOUS

## 2020-05-24 MED ORDER — PHENYLEPHRINE HCL (PRESSORS) 10 MG/ML IV SOLN
INTRAVENOUS | Status: DC | PRN
  Administered 2020-05-24: 80 ug via INTRAVENOUS
  Administered 2020-05-24: 40 ug via INTRAVENOUS

## 2020-05-24 MED ORDER — FENTANYL BOLUS VIA INFUSION
25.0000 ug | INTRAVENOUS | Status: DC | PRN
  Filled 2020-05-24: qty 25

## 2020-05-24 MED ORDER — ONDANSETRON HCL 4 MG/2ML IJ SOLN
4.0000 mg | Freq: Four times a day (QID) | INTRAMUSCULAR | Status: DC | PRN
  Administered 2020-05-31 – 2020-06-28 (×2): 4 mg via INTRAVENOUS
  Filled 2020-05-24 (×2): qty 2

## 2020-05-24 MED ORDER — PROPOFOL 10 MG/ML IV BOLUS
INTRAVENOUS | Status: AC
  Filled 2020-05-24: qty 40

## 2020-05-24 MED ORDER — DOCUSATE SODIUM 100 MG PO CAPS
100.0000 mg | ORAL_CAPSULE | Freq: Two times a day (BID) | ORAL | Status: DC
  Filled 2020-05-24: qty 1

## 2020-05-24 MED ORDER — NOREPINEPHRINE 4 MG/250ML-% IV SOLN
0.0000 ug/min | INTRAVENOUS | Status: DC
  Administered 2020-05-25: 2 ug/min via INTRAVENOUS
  Administered 2020-05-25: 4 ug/min via INTRAVENOUS
  Filled 2020-05-24 (×2): qty 250

## 2020-05-24 MED ORDER — VASOPRESSIN 20 UNIT/ML IV SOLN
INTRAVENOUS | Status: AC
  Filled 2020-05-24: qty 1

## 2020-05-24 MED ORDER — FENTANYL 2500MCG IN NS 250ML (10MCG/ML) PREMIX INFUSION
25.0000 ug/h | INTRAVENOUS | Status: DC
  Administered 2020-05-24: 50 ug/h via INTRAVENOUS
  Administered 2020-05-25: 75 ug/h via INTRAVENOUS
  Filled 2020-05-24 (×2): qty 250

## 2020-05-24 MED ORDER — VASOPRESSIN 20 UNIT/ML IV SOLN
INTRAVENOUS | Status: DC | PRN
  Administered 2020-05-24: 1 [IU] via INTRAVENOUS

## 2020-05-24 MED ORDER — ROCURONIUM 10MG/ML (10ML) SYRINGE FOR MEDFUSION PUMP - OPTIME
INTRAVENOUS | Status: DC | PRN
  Administered 2020-05-24: 100 mg via INTRAVENOUS

## 2020-05-24 MED ORDER — CHLORHEXIDINE GLUCONATE CLOTH 2 % EX PADS
6.0000 | MEDICATED_PAD | Freq: Every day | CUTANEOUS | Status: DC
  Administered 2020-05-26 – 2020-06-28 (×33): 6 via TOPICAL

## 2020-05-24 MED ORDER — SODIUM CHLORIDE 0.9 % IV SOLN
INTRAVENOUS | Status: AC | PRN
  Administered 2020-05-24: 1000 mL via INTRAVENOUS

## 2020-05-24 MED ORDER — LACTATED RINGERS IV SOLN: INTRAVENOUS | Status: DC | PRN

## 2020-05-24 MED ORDER — ENOXAPARIN SODIUM 30 MG/0.3ML ~~LOC~~ SOLN
30.0000 mg | Freq: Two times a day (BID) | SUBCUTANEOUS | Status: DC
  Administered 2020-05-25 – 2020-05-27 (×5): 30 mg via SUBCUTANEOUS
  Filled 2020-05-24 (×5): qty 0.3

## 2020-05-24 MED ORDER — 0.9 % SODIUM CHLORIDE (POUR BTL) OPTIME
TOPICAL | Status: DC | PRN
  Administered 2020-05-24: 1000 mL

## 2020-05-24 SURGICAL SUPPLY — 44 items
BLADE CLIPPER SURG (BLADE) IMPLANT
CANISTER SUCT 3000ML PPV (MISCELLANEOUS) ×2 IMPLANT
COVER SURGICAL LIGHT HANDLE (MISCELLANEOUS) ×2 IMPLANT
COVER WAND RF STERILE (DRAPES) ×2 IMPLANT
DRAPE UTILITY XL STRL (DRAPES) ×2 IMPLANT
DRSG TEGADERM 4X4.75 (GAUZE/BANDAGES/DRESSINGS) ×2 IMPLANT
DRSG TELFA 3X8 NADH (GAUZE/BANDAGES/DRESSINGS) ×2 IMPLANT
ELECT CAUTERY BLADE 6.4 (BLADE) ×2 IMPLANT
ELECT REM PT RETURN 9FT ADLT (ELECTROSURGICAL) ×2
ELECTRODE REM PT RTRN 9FT ADLT (ELECTROSURGICAL) ×1 IMPLANT
GAUZE 4X4 16PLY RFD (DISPOSABLE) ×2 IMPLANT
GAUZE SPONGE 4X4 12PLY STRL LF (GAUZE/BANDAGES/DRESSINGS) ×1 IMPLANT
GLOVE BIO SURGEON STRL SZ 6.5 (GLOVE) ×4 IMPLANT
GLOVE BIOGEL PI IND STRL 6 (GLOVE) ×1 IMPLANT
GLOVE BIOGEL PI IND STRL 7.0 (GLOVE) IMPLANT
GLOVE BIOGEL PI IND STRL 7.5 (GLOVE) IMPLANT
GLOVE BIOGEL PI INDICATOR 6 (GLOVE) ×4
GLOVE BIOGEL PI INDICATOR 7.0 (GLOVE) ×1
GLOVE BIOGEL PI INDICATOR 7.5 (GLOVE) ×1
GOWN STRL REUS W/ TWL LRG LVL3 (GOWN DISPOSABLE) ×2 IMPLANT
GOWN STRL REUS W/TWL LRG LVL3 (GOWN DISPOSABLE) ×4
INTRODUCER TRACH BLUE RHINO 6F (TUBING) ×1 IMPLANT
INTRODUCER TRACH BLUE RHINO 8F (TUBING) IMPLANT
KIT BASIN OR (CUSTOM PROCEDURE TRAY) ×2 IMPLANT
KIT TURNOVER KIT B (KITS) ×2 IMPLANT
NS IRRIG 1000ML POUR BTL (IV SOLUTION) ×2 IMPLANT
PACK EENT II TURBAN DRAPE (CUSTOM PROCEDURE TRAY) ×2 IMPLANT
PAD ARMBOARD 7.5X6 YLW CONV (MISCELLANEOUS) ×4 IMPLANT
PAD DRESSING TELFA 3X8 NADH (GAUZE/BANDAGES/DRESSINGS) ×1 IMPLANT
PENCIL BUTTON HOLSTER BLD 10FT (ELECTRODE) ×2 IMPLANT
SPONGE DRAIN TRACH 4X4 STRL 2S (GAUZE/BANDAGES/DRESSINGS) ×1 IMPLANT
SPONGE INTESTINAL PEANUT (DISPOSABLE) ×2 IMPLANT
SUT SILK 2 0 SH (SUTURE) IMPLANT
SUT VIC AB 2-0 SH 27 (SUTURE)
SUT VIC AB 2-0 SH 27XBRD (SUTURE) IMPLANT
SUT VICRYL AB 3 0 TIES (SUTURE) ×2 IMPLANT
SYR 20ML LL LF (SYRINGE) ×2 IMPLANT
TAPE CLOTH SURG 4X10 WHT LF (GAUZE/BANDAGES/DRESSINGS) ×1 IMPLANT
TOWEL GREEN STERILE (TOWEL DISPOSABLE) ×2 IMPLANT
TOWEL GREEN STERILE FF (TOWEL DISPOSABLE) ×2 IMPLANT
TRAY W/TRACH TUBE 7.5 (MISCELLANEOUS) ×1 IMPLANT
TUBE CONNECTING 12X1/4 (SUCTIONS) ×2 IMPLANT
TUBE TRACH  6.0 CUFF FLEX (MISCELLANEOUS) ×2
TUBE TRACH 6.0 CUFF FLEX (MISCELLANEOUS) IMPLANT

## 2020-05-24 NOTE — Progress Notes (Signed)
Emergent Bedside EDG done with Lovick, MD. No injury found. Normal EGD. Bedside RN handled continuous sedation drips. Report given to bedside RN.

## 2020-05-24 NOTE — Transfer of Care (Signed)
Immediate Anesthesia Transfer of Care Note  Patient: Daniel [name]  Procedure(s) Performed: TRACHEOSTOMY (N/A Neck)  Patient Location: ICU  Anesthesia Type:General  Level of Consciousness: Patient remains intubated per anesthesia plan  Airway & Oxygen Therapy: Patient remains intubated per anesthesia plan and Patient placed on Ventilator (see vital sign flow sheet for setting)  Post-op Assessment: Report given to RN and Post -op Vital signs reviewed and stable  Post vital signs: Reviewed and stable  Last Vitals:  Vitals Value Taken Time  BP 110/72 05/24/20 2255  Temp    Pulse 96 05/24/20 2256  Resp 21 05/24/20 2256  SpO2 100 % 05/24/20 2256  Vitals shown include unvalidated device data.  Last Pain:  Vitals:   05/24/20 2237  TempSrc:   PainSc: 3          Complications: No complications documented.

## 2020-05-24 NOTE — Op Note (Signed)
   Operative Note  Date: 05/24/2020  Procedure: awake percutaneous tracheostomy without bronchoscopic assistance  Pre-op diagnosis: impending airway compromise Post-op diagnosis: same  Indication and clinical history: The patient  is a 80 y.o. year old male with impending airway compromise. He was brought up from the emergency department to the operating room to secure his airway with the initial plan for an awake fiberoptic vs glidescope intubation, however after discussion with the anesthesiologist, due to his history of Eliquis administration and level of air hunger, it was felt that the glidescope may induce additional trauma and bleeding, thereby worsening his air hunger and ability to intubate from above. The decision was then made to perform an awake tracheostomy.   Surgeon: Jesusita Oka, MD  Anesthesia: dexmedetomidine  Findings:  . Specimen: none . EBL: <5cc  . Drains/Implants: #6 Shiley cuffed tracheostomy tube  Disposition: CT, then ICU  Description of procedure: The patient was prepped and draped upright and then transitioned to the operating room table in the upright position. Local anesthetic was infiltrated into the tissues anterior to the trachea and a vertical incision made. The tissues anterior to the trachea were dissected bluntly and an introducer needle was inserted between the second and third tracheal rings. A guidewire was passed through the introducer needle and the needle removed. Serial dilation was performed and a #6 cuffed Shiley and stylet were inserted over the guidewire and the guidewire and stylet removed. The inner cannula was inserted, the pilot balloon on the tracheostomy inflated, and the ventilator tubing connected to the tracheostomy. Chest rise, appropriate end-tidal CO2, and return tidal volumes were confirmed. The tracheostomy tube was sutured in four quadrants and a tracheostomy tie applied to the neck. The patient tolerated the procedure well.  There were no complications. Post-procedure chest x-ray was ordered to confirm tube position and the absence of a pneumothorax.   Jesusita Oka, MD General and [facility]
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
## 2020-05-24 NOTE — Anesthesia Postprocedure Evaluation (Signed)
Anesthesia Post Note  Patient: Daniel [name]  Procedure(s) Performed: TRACHEOSTOMY (N/A Neck)     Patient location during evaluation: ICU Anesthetic complications: no   No complications documented.  Last Vitals:  Vitals:   05/24/20 2245 05/24/20 2250  BP: 115/76 (!) 94/59  Pulse: (!) 105 95  Resp: 18 17  Temp:    SpO2: 100% 98%    Last Pain:  Vitals:   05/24/20 2237  TempSrc:   PainSc: 3                  Adaijah Endres

## 2020-05-24 NOTE — Anesthesia Preprocedure Evaluation (Addendum)
Anesthesia Evaluation  Patient identified by MRN, date of birth, ID band Patient awake  Preop documentation limited or incomplete due to emergent nature of procedure.  Airway      Mouth opening: Limited Mouth Opening  Dental   Pulmonary      + stridor     Cardiovascular hypertension,  Rhythm:Regular Rate:Tachycardia     Neuro/Psych    GI/Hepatic   Endo/Other    Renal/GU      Musculoskeletal   Abdominal   Peds  Hematology   Anesthesia Other Findings   Reproductive/Obstetrics                            Anesthesia Physical Anesthesia Plan  ASA: III  Anesthesia Plan: General   Post-op Pain Management:    Induction: Intravenous  PONV Risk Score and Plan: Treatment may vary due to age or medical condition  Airway Management Planned: Tracheostomy  Additional Equipment:   Intra-op Plan:   Post-operative Plan: Possible Post-op intubation/ventilation  Informed Consent:     Dental advisory given  Plan Discussed with: Anesthesiologist, CRNA and Surgeon  Anesthesia Plan Comments:        Anesthesia Quick Evaluation

## 2020-05-24 NOTE — ED Notes (Signed)
Tetanus booster given IM left deltoid.

## 2020-05-24 NOTE — Progress Notes (Signed)
Patient transported from [place] 9 to CT and then to 5L93 with no complications.

## 2020-05-25 ENCOUNTER — Inpatient Hospital Stay (HOSPITAL_COMMUNITY): Payer: Medicare Other

## 2020-05-25 DIAGNOSIS — S02601B Fracture of unspecified part of body of right mandible, initial encounter for open fracture: Secondary | ICD-10-CM

## 2020-05-25 DIAGNOSIS — W3400XA Accidental discharge from unspecified firearms or gun, initial encounter: Secondary | ICD-10-CM

## 2020-05-25 LAB — URINALYSIS, ROUTINE W REFLEX MICROSCOPIC
Bilirubin Urine: NEGATIVE
Glucose, UA: NEGATIVE mg/dL
Hgb urine dipstick: NEGATIVE
Ketones, ur: 5 mg/dL — AB
Leukocytes,Ua: NEGATIVE
Nitrite: NEGATIVE
Protein, ur: NEGATIVE mg/dL
Specific Gravity, Urine: 1.038 — ABNORMAL HIGH (ref 1.005–1.030)
pH: 8 (ref 5.0–8.0)

## 2020-05-25 LAB — CBC
HCT: 36.5 % — ABNORMAL LOW (ref 39.0–52.0)
HCT: 38.3 % — ABNORMAL LOW (ref 39.0–52.0)
HCT: 35.7 % — ABNORMAL LOW (ref 39.0–52.0)
Hemoglobin: 11.1 g/dL — ABNORMAL LOW (ref 13.0–17.0)
Hemoglobin: 12.4 g/dL — ABNORMAL LOW (ref 13.0–17.0)
Hemoglobin: 11 g/dL — ABNORMAL LOW (ref 13.0–17.0)
MCH: 26.2 pg (ref 26.0–34.0)
MCH: 27 pg (ref 26.0–34.0)
MCH: 26.6 pg (ref 26.0–34.0)
MCHC: 30.4 g/dL (ref 30.0–36.0)
MCHC: 32.4 g/dL (ref 30.0–36.0)
MCHC: 30.8 g/dL (ref 30.0–36.0)
MCV: 86.3 fL (ref 80.0–100.0)
MCV: 83.4 fL (ref 80.0–100.0)
MCV: 86.2 fL (ref 80.0–100.0)
Platelets: 227 10*3/uL (ref 150–400)
Platelets: 192 10*3/uL (ref 150–400)
Platelets: 174 10*3/uL (ref 150–400)
RBC: 4.23 MIL/uL (ref 4.22–5.81)
RBC: 4.59 MIL/uL (ref 4.22–5.81)
RBC: 4.14 MIL/uL — ABNORMAL LOW (ref 4.22–5.81)
RDW: 15.1 % (ref 11.5–15.5)
RDW: 14.9 % (ref 11.5–15.5)
RDW: 15.1 % (ref 11.5–15.5)
WBC: 16.7 10*3/uL — ABNORMAL HIGH (ref 4.0–10.5)
WBC: 9.9 10*3/uL (ref 4.0–10.5)
WBC: 8.5 10*3/uL (ref 4.0–10.5)
nRBC: 0 % (ref 0.0–0.2)
nRBC: 0 % (ref 0.0–0.2)
nRBC: 0 % (ref 0.0–0.2)

## 2020-05-25 LAB — POCT I-STAT 7, (LYTES, BLD GAS, ICA,H+H)
Acid-base deficit: 1 mmol/L (ref 0.0–2.0)
Bicarbonate: 20.7 mmol/L (ref 20.0–28.0)
Calcium, Ion: 0.9 mmol/L — ABNORMAL LOW (ref 1.15–1.40)
HCT: 25 % — ABNORMAL LOW (ref 39.0–52.0)
Hemoglobin: 8.5 g/dL — ABNORMAL LOW (ref 13.0–17.0)
O2 Saturation: 100 %
Patient temperature: 97
Potassium: 2.1 mmol/L — CL (ref 3.5–5.1)
Sodium: 148 mmol/L — ABNORMAL HIGH (ref 135–145)
TCO2: 21 mmol/L — ABNORMAL LOW (ref 22–32)
pCO2 arterial: 24.3 mmHg — ABNORMAL LOW (ref 32.0–48.0)
pH, Arterial: 7.536 — ABNORMAL HIGH (ref 7.350–7.450)
pO2, Arterial: 151 mmHg — ABNORMAL HIGH (ref 83.0–108.0)

## 2020-05-25 LAB — TYPE AND SCREEN
ABO/RH(D): A POS
Antibody Screen: NEGATIVE
Unit division: 0
Unit division: 0
Unit division: 0
Unit division: 0

## 2020-05-25 LAB — BPAM RBC
Blood Product Expiration Date: 202202112359
Blood Product Expiration Date: 202202112359
Blood Product Expiration Date: 202202112359
Blood Product Expiration Date: 202202112359
ISSUE DATE / TIME: 202201102108
ISSUE DATE / TIME: 202201102108
ISSUE DATE / TIME: 202201102341
ISSUE DATE / TIME: 202201102341
Unit Type and Rh: 5100
Unit Type and Rh: 5100
Unit Type and Rh: 5100
Unit Type and Rh: 5100

## 2020-05-25 LAB — SURGICAL PCR SCREEN
MRSA, PCR: NEGATIVE
Staphylococcus aureus: NEGATIVE

## 2020-05-25 LAB — TRAUMA TEG PANEL
CFF Max Amplitude: 32 mm (ref 15–32)
Citrated Kaolin (R): 3.7 min — ABNORMAL LOW (ref 4.6–9.1)
Citrated Rapid TEG (MA): 64.8 mm (ref 52–70)
Lysis at 30 Minutes: 0 % (ref 0.0–2.6)

## 2020-05-25 LAB — BASIC METABOLIC PANEL
Anion gap: 10 (ref 5–15)
BUN: 14 mg/dL (ref 8–23)
CO2: 28 mmol/L (ref 22–32)
Calcium: 8.5 mg/dL — ABNORMAL LOW (ref 8.9–10.3)
Chloride: 100 mmol/L (ref 98–111)
Creatinine, Ser: 1.18 mg/dL (ref 0.61–1.24)
GFR, Estimated: >60 mL/min (ref >60)
Glucose, Bld: 153 mg/dL — ABNORMAL HIGH (ref 70–99)
Potassium: 3.1 mmol/L — ABNORMAL LOW (ref 3.5–5.1)
Sodium: 138 mmol/L (ref 135–145)

## 2020-05-25 LAB — MAGNESIUM: Magnesium: 1.7 mg/dL (ref 1.7–2.4)

## 2020-05-25 LAB — ABO/RH: ABO/RH(D): A POS

## 2020-05-25 LAB — TRIGLYCERIDES: Triglycerides: 75 mg/dL (ref <150)

## 2020-05-25 MED ORDER — SODIUM CHLORIDE 0.9 % IV SOLN
INTRAVENOUS | Status: DC | PRN
  Administered 2020-05-25 (×2): 250 mL via INTRAVENOUS
  Administered 2020-05-28: 500 mL via INTRAVENOUS
  Administered 2020-06-02: 250 mL via INTRAVENOUS

## 2020-05-25 MED ORDER — POTASSIUM CHLORIDE 10 MEQ/100ML IV SOLN
10.0000 meq | INTRAVENOUS | Status: AC
  Administered 2020-05-25 (×5): 10 meq via INTRAVENOUS
  Filled 2020-05-25 (×5): qty 100

## 2020-05-25 MED ORDER — POTASSIUM CHLORIDE IN NACL 20-0.9 MEQ/L-% IV SOLN
INTRAVENOUS | Status: DC
  Filled 2020-05-25 (×3): qty 1000

## 2020-05-25 MED ORDER — PANTOPRAZOLE SODIUM 40 MG IV SOLR
40.0000 mg | INTRAVENOUS | Status: DC
  Administered 2020-05-25 – 2020-05-27 (×3): 40 mg via INTRAVENOUS
  Filled 2020-05-25 (×3): qty 40

## 2020-05-25 MED ORDER — CHLORHEXIDINE GLUCONATE 0.12% ORAL RINSE (MEDLINE KIT)
15.0000 mL | Freq: Two times a day (BID) | OROMUCOSAL | Status: DC
  Administered 2020-05-25 – 2020-06-28 (×69): 15 mL via OROMUCOSAL

## 2020-05-25 MED ORDER — SODIUM CHLORIDE 0.9 % IV SOLN: INTRAVENOUS | Status: DC

## 2020-05-25 MED ORDER — ALBUMIN HUMAN 5 % IV SOLN
12.5000 g | Freq: Once | INTRAVENOUS | Status: AC
  Administered 2020-05-25: 12.5 g via INTRAVENOUS
  Filled 2020-05-25: qty 250

## 2020-05-25 MED ORDER — ORAL CARE MOUTH RINSE
15.0000 mL | OROMUCOSAL | Status: DC
  Administered 2020-05-25 – 2020-06-28 (×342): 15 mL via OROMUCOSAL

## 2020-05-25 MED ORDER — MUPIROCIN 2 % EX OINT
1.0000 "application " | TOPICAL_OINTMENT | Freq: Two times a day (BID) | CUTANEOUS | Status: AC
  Administered 2020-05-25 – 2020-05-30 (×10): 1 via NASAL
  Filled 2020-05-25 (×3): qty 22
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
## 2020-05-25 NOTE — TOC CAGE-AID Note (Signed)
Transition of Care [facility]) - CAGE-AID Screening   Patient Details  Name: Daniel [name] MRN: [id number] Date of Birth: 08-02-1940  [facility] CM/SW Contact:    Andrey Farmer, RN Phone Number: 05/25/2020, 10:22 AM   Clinical Narrative: Patient unable to participate. 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   CAGE-AID Screening: Substance Abuse Screening unable to be completed due to: : Patient unable to participate
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
## 2020-05-25 NOTE — Procedures (Signed)
Arterial Catheter Insertion Procedure Note  Daniel [name]  [id number]  1941/04/28  Date:05/25/20  Time:1:16 AM    Provider Performing: Jesusita Oka    Procedure: Insertion of Arterial Line [phone]) with US guidance ([id number])   Indication(s) Blood pressure monitoring and/or need for frequent ABGs  Consent Unable to obtain consent due to emergent nature of procedure.  Anesthesia None   Time Out Verified patient identification, verified procedure, site/side was marked, verified correct patient position, special equipment/implants available, medications/allergies/relevant history reviewed, required imaging and test results available.   Sterile Technique Maximal sterile technique including full sterile barrier drape, hand hygiene, sterile gown, sterile gloves, mask, hair covering, sterile ultrasound probe cover (if used).   Procedure Description Area of catheter insertion was cleaned with chlorhexidine and draped in sterile fashion. With real-time ultrasound guidance an arterial catheter was placed into the right radial artery.  Appropriate arterial tracings confirmed on monitor.     Complications/Tolerance None; patient tolerated the procedure well.   EBL Minimal   Specimen(s) None
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
## 2020-05-25 NOTE — Progress Notes (Signed)
Acute precipitous drop in BP associated with bradycardia. Vasopressors initiated, 2u uncrossmatched blood ordered. TLC, a-line placed.   Add'l crit care time: 66min  Jesusita Oka, MD General and [facility]

## 2020-05-25 NOTE — Procedures (Signed)
   Procedure Note  Date: 05/25/2020  Procedure: central venous catheter placement--right, femoral vein, with ultrasound guidance  Pre-op diagnosis: hypotension, need for administration of vasopressors Post-op diagnosis: same  Surgeon: Jesusita Oka, MD  Anesthesia: local  EBL: <5cc Drains/Implants: single lumen cordis central venous catheter  Description of procedure: This procedure was performed emergently and therefore informed consent was not obtained. The right groin was prepped and draped in the usual sterile fashion. The right femoral vein was localized with ultrasound guidance. The right femoral vein was accessed using an introducer needle and a guidewire passed through the needle. The needle was removed and a skin nick was made. The tract was dilated and the central venous catheter advanced over the guidewire followed by removal of the guidewire. All ports drew blood easily and all were flushed with saline. The catheter was secured to the skin with suture and a sterile dressing. The patient tolerated the procedure well. There were no immediate complications.    Jesusita Oka, MD General and [facility] [facility] [facility]

## 2020-05-25 NOTE — Progress Notes (Signed)
OT Cancellation Note  Patient Details Name: Daniel [name] MRN: [id number] DOB: Oct 31, 1940   Cancelled Treatment:    Reason Eval/Treat Not Completed: Other (comment) the patient intubated; sedation on board; K+ 2.1; nsg checking Hgb. Per nsg report pt to OR tomorrow for surgery. Will hold at this time and assess when appropriate. Thanks  [place], OT/L   Acute OT Clinical Specialist Acute Rehabilitation Services Pager [phone] Office [phone]  05/25/2020, 9:13 AM

## 2020-05-26 ENCOUNTER — Inpatient Hospital Stay (HOSPITAL_COMMUNITY): Payer: Medicare Other

## 2020-05-26 ENCOUNTER — Encounter (HOSPITAL_COMMUNITY): Payer: Self-pay

## 2020-05-26 ENCOUNTER — Inpatient Hospital Stay (HOSPITAL_COMMUNITY): Payer: Medicare Other | Admitting: Anesthesiology

## 2020-05-26 ENCOUNTER — Encounter (HOSPITAL_COMMUNITY)
Admission: EM | Disposition: A | Discharge status: Nursing Home (Includes ICF) | Payer: Self-pay | Source: Home / Self Care

## 2020-05-26 DIAGNOSIS — S02601A Fracture of unspecified part of body of right mandible, initial encounter for closed fracture: Secondary | ICD-10-CM | POA: Diagnosis not present

## 2020-05-26 DIAGNOSIS — J9601 Acute respiratory failure with hypoxia: Secondary | ICD-10-CM | POA: Diagnosis not present

## 2020-05-26 DIAGNOSIS — I462 Cardiac arrest due to underlying cardiac condition: Secondary | ICD-10-CM | POA: Diagnosis not present

## 2020-05-26 DIAGNOSIS — R578 Other shock: Secondary | ICD-10-CM | POA: Diagnosis not present

## 2020-05-26 HISTORY — PX: ESOPHAGOGASTRODUODENOSCOPY: SHX5428

## 2020-05-26 HISTORY — PX: ORIF MANDIBULAR FRACTURE: SHX2127

## 2020-05-26 HISTORY — PX: LAPAROSCOPY: SHX197

## 2020-05-26 HISTORY — PX: CLOSED REDUCTION MANDIBLE WITH MANDIBULOMA: SHX5313

## 2020-05-26 LAB — CBC
HCT: 35.9 % — ABNORMAL LOW (ref 39.0–52.0)
HCT: 36.1 % — ABNORMAL LOW (ref 39.0–52.0)
Hemoglobin: 11.1 g/dL — ABNORMAL LOW (ref 13.0–17.0)
Hemoglobin: 11 g/dL — ABNORMAL LOW (ref 13.0–17.0)
MCH: 26.6 pg (ref 26.0–34.0)
MCH: 26.4 pg (ref 26.0–34.0)
MCHC: 30.9 g/dL (ref 30.0–36.0)
MCHC: 30.5 g/dL (ref 30.0–36.0)
MCV: 85.9 fL (ref 80.0–100.0)
MCV: 86.8 fL (ref 80.0–100.0)
Platelets: 166 10*3/uL (ref 150–400)
Platelets: 172 10*3/uL (ref 150–400)
RBC: 4.18 MIL/uL — ABNORMAL LOW (ref 4.22–5.81)
RBC: 4.16 MIL/uL — ABNORMAL LOW (ref 4.22–5.81)
RDW: 15.4 % (ref 11.5–15.5)
RDW: 15.6 % — ABNORMAL HIGH (ref 11.5–15.5)
WBC: 9.8 10*3/uL (ref 4.0–10.5)
WBC: 11.7 10*3/uL — ABNORMAL HIGH (ref 4.0–10.5)
nRBC: 0 % (ref 0.0–0.2)
nRBC: 0 % (ref 0.0–0.2)

## 2020-05-26 LAB — BASIC METABOLIC PANEL
Anion gap: 10 (ref 5–15)
BUN: 13 mg/dL (ref 8–23)
CO2: 26 mmol/L (ref 22–32)
Calcium: 8.1 mg/dL — ABNORMAL LOW (ref 8.9–10.3)
Chloride: 102 mmol/L (ref 98–111)
Creatinine, Ser: 1.29 mg/dL — ABNORMAL HIGH (ref 0.61–1.24)
GFR, Estimated: 57 mL/min — ABNORMAL LOW (ref >60)
Glucose, Bld: 102 mg/dL — ABNORMAL HIGH (ref 70–99)
Potassium: 3.5 mmol/L (ref 3.5–5.1)
Sodium: 138 mmol/L (ref 135–145)

## 2020-05-26 LAB — TRIGLYCERIDES: Triglycerides: 101 mg/dL (ref <150)

## 2020-05-26 SURGERY — OPEN REDUCTION INTERNAL FIXATION (ORIF) MANDIBULAR FRACTURE
Anesthesia: General | Laterality: Right

## 2020-05-26 MED ORDER — NOREPINEPHRINE 4 MG/250ML-% IV SOLN: 0.0000 ug/min | INTRAVENOUS | Status: DC

## 2020-05-26 MED ORDER — ESMOLOL HCL 100 MG/10ML IV SOLN
INTRAVENOUS | Status: DC | PRN
  Administered 2020-05-26: 20 mg via INTRAVENOUS

## 2020-05-26 MED ORDER — FENTANYL CITRATE (PF) 250 MCG/5ML IJ SOLN
INTRAMUSCULAR | Status: AC
  Filled 2020-05-26: qty 5

## 2020-05-26 MED ORDER — MIDAZOLAM HCL 5 MG/5ML IJ SOLN
INTRAMUSCULAR | Status: DC | PRN
  Administered 2020-05-26: 2 mg via INTRAVENOUS

## 2020-05-26 MED ORDER — FENTANYL CITRATE (PF) 2500 MCG/50ML IJ SOLN
25.0000 ug/h | Status: DC
  Filled 2020-05-26: qty 100

## 2020-05-26 MED ORDER — FENTANYL 2500MCG IN NS 250ML (10MCG/ML) PREMIX INFUSION
0.0000 ug/h | INTRAVENOUS | Status: DC
  Administered 2020-05-26: 100 ug/h via INTRAVENOUS
  Administered 2020-05-27: 200 ug/h via INTRAVENOUS
  Filled 2020-05-26 (×2): qty 250

## 2020-05-26 MED ORDER — DOCUSATE SODIUM 50 MG/5ML PO LIQD
100.0000 mg | Freq: Two times a day (BID) | ORAL | Status: DC
  Administered 2020-05-26 – 2020-06-05 (×17): 100 mg
  Filled 2020-05-26 (×17): qty 10

## 2020-05-26 MED ORDER — METOPROLOL TARTRATE 5 MG/5ML IV SOLN
INTRAVENOUS | Status: DC | PRN
  Administered 2020-05-26 (×2): 1 mg via INTRAVENOUS

## 2020-05-26 MED ORDER — FENTANYL 2500MCG IN NS 250ML (10MCG/ML) PREMIX INFUSION: 25.0000 ug/h | INTRAVENOUS | Status: DC

## 2020-05-26 MED ORDER — FENTANYL CITRATE (PF) 100 MCG/2ML IJ SOLN: 50.0000 ug | Freq: Once | INTRAMUSCULAR | Status: AC

## 2020-05-26 MED ORDER — FENTANYL CITRATE (PF) 2500 MCG/50ML IJ SOLN
50.0000 ug/h | Status: DC
  Filled 2020-05-26: qty 20

## 2020-05-26 MED ORDER — FENTANYL CITRATE (PF) 100 MCG/2ML IJ SOLN
INTRAMUSCULAR | Status: DC | PRN
  Administered 2020-05-26 (×6): 50 ug via INTRAVENOUS

## 2020-05-26 MED ORDER — CHLORHEXIDINE GLUCONATE 0.12 % MT SOLN
15.0000 mL | Freq: Two times a day (BID) | OROMUCOSAL | Status: DC
  Filled 2020-05-26: qty 15

## 2020-05-26 MED ORDER — SODIUM CHLORIDE 0.9 % IV SOLN
3.0000 g | INTRAVENOUS | Status: DC
  Filled 2020-05-26: qty 8

## 2020-05-26 MED ORDER — SODIUM CHLORIDE 0.9 % IV SOLN
250.0000 mL | INTRAVENOUS | Status: DC
  Administered 2020-05-26: 250 mL via INTRAVENOUS

## 2020-05-26 MED ORDER — PHENYLEPHRINE 40 MCG/ML (10ML) SYRINGE FOR IV PUSH (FOR BLOOD PRESSURE SUPPORT)
PREFILLED_SYRINGE | INTRAVENOUS | Status: DC | PRN
  Administered 2020-05-26: 40 ug via INTRAVENOUS
  Administered 2020-05-26 (×4): 80 ug via INTRAVENOUS

## 2020-05-26 MED ORDER — PIVOT 1.5 CAL PO LIQD
1000.0000 mL | ORAL | Status: DC
  Administered 2020-05-26 – 2020-05-30 (×5): 1000 mL
  Filled 2020-05-26 (×7): qty 1000

## 2020-05-26 MED ORDER — FENTANYL CITRATE (PF) 2500 MCG/50ML IJ SOLN
25.0000 ug/h | Status: DC
  Filled 2020-05-26 (×2): qty 100

## 2020-05-26 MED ORDER — SODIUM CHLORIDE 0.9 % IV SOLN
250.0000 mL | INTRAVENOUS | Status: DC
  Administered 2020-06-09 – 2020-06-25 (×3): 250 mL via INTRAVENOUS

## 2020-05-26 MED ORDER — 0.9 % SODIUM CHLORIDE (POUR BTL) OPTIME
TOPICAL | Status: DC | PRN
  Administered 2020-05-26: 1000 mL

## 2020-05-26 MED ORDER — FENTANYL BOLUS VIA INFUSION
50.0000 ug | INTRAVENOUS | Status: DC | PRN
  Administered 2020-05-26 (×3): 50 ug via INTRAVENOUS
  Filled 2020-05-26: qty 50

## 2020-05-26 MED ORDER — PHENYLEPHRINE HCL-NACL 10-0.9 MG/250ML-% IV SOLN
INTRAVENOUS | Status: DC | PRN
  Administered 2020-05-26: 25 ug/min via INTRAVENOUS

## 2020-05-26 MED ORDER — PROSOURCE TF PO LIQD: 45.0000 mL | Freq: Two times a day (BID) | ORAL | Status: DC

## 2020-05-26 MED ORDER — MIDAZOLAM HCL 2 MG/2ML IJ SOLN
INTRAMUSCULAR | Status: AC
  Filled 2020-05-26: qty 2

## 2020-05-26 MED ORDER — METOPROLOL TARTRATE 5 MG/5ML IV SOLN: 5.0000 mg | Freq: Four times a day (QID) | INTRAVENOUS | Status: DC

## 2020-05-26 MED ORDER — SODIUM CHLORIDE 0.9 % IV SOLN
3.0000 g | Freq: Four times a day (QID) | INTRAVENOUS | Status: DC
  Administered 2020-05-26 – 2020-05-30 (×16): 3 g via INTRAVENOUS
  Filled 2020-05-26 (×4): qty 3
  Filled 2020-05-26: qty 8
  Filled 2020-05-26 (×7): qty 3
  Filled 2020-05-26: qty 8
  Filled 2020-05-26 (×2): qty 3
  Filled 2020-05-26: qty 8
  Filled 2020-05-26 (×2): qty 3

## 2020-05-26 MED ORDER — LACTATED RINGERS IV SOLN: INTRAVENOUS | Status: DC | PRN

## 2020-05-26 MED ORDER — NOREPINEPHRINE 4 MG/250ML-% IV SOLN
0.0000 ug/min | INTRAVENOUS | Status: DC
  Administered 2020-05-26: 2 ug/min via INTRAVENOUS
  Administered 2020-05-27 – 2020-05-28 (×3): 6 ug/min via INTRAVENOUS
  Administered 2020-05-28 – 2020-05-29 (×2): 4 ug/min via INTRAVENOUS
  Administered 2020-05-30: 1 ug/min via INTRAVENOUS
  Filled 2020-05-26 (×7): qty 250

## 2020-05-26 MED ORDER — FREE WATER
200.0000 mL | Freq: Four times a day (QID) | Status: DC
  Administered 2020-05-26 – 2020-05-31 (×20): 200 mL

## 2020-05-26 MED ORDER — VITAL HIGH PROTEIN PO LIQD: 1000.0000 mL | ORAL | Status: DC

## 2020-05-26 MED ORDER — ESMOLOL HCL 100 MG/10ML IV SOLN
INTRAVENOUS | Status: AC
  Filled 2020-05-26: qty 10

## 2020-05-26 MED ORDER — SODIUM CHLORIDE 0.9 % IV SOLN
INTRAVENOUS | Status: DC | PRN
  Administered 2020-05-26: 3 g via INTRAVENOUS

## 2020-05-26 MED ORDER — PIVOT 1.5 CAL PO LIQD: 1000.0000 mL | ORAL | Status: DC

## 2020-05-26 MED ORDER — PROPOFOL 10 MG/ML IV BOLUS
INTRAVENOUS | Status: DC | PRN
  Administered 2020-05-26: 30 mg via INTRAVENOUS

## 2020-05-26 MED ORDER — ROCURONIUM BROMIDE 10 MG/ML (PF) SYRINGE
PREFILLED_SYRINGE | INTRAVENOUS | Status: DC | PRN
  Administered 2020-05-26 (×2): 30 mg via INTRAVENOUS
  Administered 2020-05-26: 40 mg via INTRAVENOUS

## 2020-05-26 MED ORDER — LIDOCAINE-EPINEPHRINE 1 %-1:100000 IJ SOLN
INTRAMUSCULAR | Status: AC
  Filled 2020-05-26: qty 1

## 2020-05-26 MED ORDER — AMIODARONE IV BOLUS ONLY 150 MG/100ML
150.0000 mg | Freq: Once | INTRAVENOUS | Status: AC
  Administered 2020-05-26: 150 mg via INTRAVENOUS
  Filled 2020-05-26: qty 100

## 2020-05-26 MED ORDER — HYDROMORPHONE HCL 1 MG/ML IJ SOLN
1.0000 mg | Freq: Once | INTRAMUSCULAR | Status: DC
  Filled 2020-05-26 (×4): qty 1

## 2020-05-26 SURGICAL SUPPLY — 75 items
ADH SKN CLS LQ APL DERMABOND (GAUZE/BANDAGES/DRESSINGS) ×2
APL PRP STRL LF DISP 70% ISPRP (MISCELLANEOUS)
BAR ARCH PREFORM MXLMNDB FX (Miscellaneous) ×2 IMPLANT
BAR FIX PREFORMED OMNIMAX (Miscellaneous) ×1 IMPLANT
BIT DRILL 1.6X115 (BIT) ×2
BIT DRILL 1.6X115MM (BIT) IMPLANT
BIT DRILL 1.6X50 (BIT) ×1 IMPLANT
BLADE CLIPPER SURG (BLADE) IMPLANT
BLADE SURG 15 STRL LF DISP TIS (BLADE) IMPLANT
BLADE SURG 15 STRL SS (BLADE)
CANISTER SUCT 3000ML PPV (MISCELLANEOUS) ×3 IMPLANT
CHLORAPREP W/TINT 26 (MISCELLANEOUS) ×2 IMPLANT
CLEANER TIP ELECTROSURG 2X2 (MISCELLANEOUS) ×2 IMPLANT
COVER SURGICAL LIGHT HANDLE (MISCELLANEOUS) ×3 IMPLANT
COVER WAND RF STERILE (DRAPES) ×2 IMPLANT
DERMABOND ADHESIVE PROPEN (GAUZE/BANDAGES/DRESSINGS) ×1
DERMABOND ADVANCED .7 DNX6 (GAUZE/BANDAGES/DRESSINGS) ×2 IMPLANT
DRAPE HALF SHEET 40X57 (DRAPES) IMPLANT
DRILL BIT 1.6X115MM (BIT) ×3
ELECT COATED BLADE 2.86 ST (ELECTRODE) ×3 IMPLANT
ELECT NDL TIP 2.8 STRL (NEEDLE) IMPLANT
ELECT NEEDLE TIP 2.8 STRL (NEEDLE) IMPLANT
ELECT REM PT RETURN 9FT ADLT (ELECTROSURGICAL) ×6
ELECTRODE REM PT RTRN 9FT ADLT (ELECTROSURGICAL) ×4 IMPLANT
GLOVE BIO SURGEON STRL SZ 6.5 (GLOVE) ×3 IMPLANT
GLOVE BIO SURGEON STRL SZ7 (GLOVE) ×3 IMPLANT
GLOVE BIOGEL PI IND STRL 6 (GLOVE) ×2 IMPLANT
GLOVE BIOGEL PI INDICATOR 6 (GLOVE)
GOWN STRL REUS W/ TWL LRG LVL3 (GOWN DISPOSABLE) ×8 IMPLANT
GOWN STRL REUS W/TWL LRG LVL3 (GOWN DISPOSABLE) ×12
KIT BASIN OR (CUSTOM PROCEDURE TRAY) ×6 IMPLANT
KIT TURNOVER KIT B (KITS) ×6 IMPLANT
NDL HYPO 25GX1X1/2 BEV (NEEDLE) IMPLANT
NEEDLE HYPO 25GX1X1/2 BEV (NEEDLE) IMPLANT
NS IRRIG 1000ML POUR BTL (IV SOLUTION) ×6 IMPLANT
PAD ARMBOARD 7.5X6 YLW CONV (MISCELLANEOUS) ×12 IMPLANT
PATTIES SURGICAL .5 X3 (DISPOSABLE) IMPLANT
PENCIL SMOKE EVACUATOR (MISCELLANEOUS) ×3 IMPLANT
PLATE 6H STR 2.0 (Plate) ×1 IMPLANT
SCISSORS LAP 5X35 DISP (ENDOMECHANICALS) IMPLANT
SCISSORS WIRE ANG 4 3/4 DISP (INSTRUMENTS) ×3 IMPLANT
SCREW BONE 2X7 CROSS DRIVE (Screw) ×4 IMPLANT
SCREW BONE MANDIB SD 2X9 (Screw) ×4 IMPLANT
SCREW LOCKING X-DR 2.0X12 (Screw) ×1 IMPLANT
SCREW NON LOCK X-DR 2.0X12 (Screw) ×2 IMPLANT
SCREW NON LOCK X-DR 2.0X14 (Screw) ×1 IMPLANT
SCREW NON LOCK X-DR 2.3X12 (Screw) ×1 IMPLANT
SET IRRIG TUBING LAPAROSCOPIC (IRRIGATION / IRRIGATOR) IMPLANT
SET TUBE SMOKE EVAC HIGH FLOW (TUBING) ×2 IMPLANT
SLEEVE ENDOPATH XCEL 5M (ENDOMECHANICALS) ×3 IMPLANT
STAPLER VISISTAT 35W (STAPLE) ×3 IMPLANT
SUT BONE WAX W31G (SUTURE) IMPLANT
SUT CHROMIC 3 0 PS 2 (SUTURE) ×1 IMPLANT
SUT CHROMIC 3 0 SH 27 (SUTURE) ×3 IMPLANT
SUT ETHILON 3 0 PS 1 (SUTURE) IMPLANT
SUT MNCRL AB 4-0 PS2 18 (SUTURE) ×3 IMPLANT
SUT SILK 3 0 (SUTURE)
SUT SILK 3 0 SH 30 (SUTURE) ×3 IMPLANT
SUT SILK 3-0 18XBRD TIE 12 (SUTURE) IMPLANT
SUT STEEL 0 (SUTURE)
SUT STEEL 0 18XMFL TIE 17 (SUTURE) IMPLANT
SUT STEEL 2 (SUTURE) IMPLANT
SUT VIC AB 3-0 FS2 27 (SUTURE) IMPLANT
SUT VIC AB 4-0 P-3 18X BRD (SUTURE) IMPLANT
SUT VIC AB 4-0 P3 18 (SUTURE)
SYR CONTROL 10ML LL (SYRINGE) ×1 IMPLANT
TOWEL GREEN STERILE (TOWEL DISPOSABLE) ×3 IMPLANT
TOWEL GREEN STERILE FF (TOWEL DISPOSABLE) ×6 IMPLANT
TRAY ENT MC OR (CUSTOM PROCEDURE TRAY) ×2 IMPLANT
TRAY LAPAROSCOPIC MC (CUSTOM PROCEDURE TRAY) ×3 IMPLANT
TROCAR XCEL BLUNT TIP 100MML (ENDOMECHANICALS) IMPLANT
TROCAR XCEL NON-BLD 5MMX100MML (ENDOMECHANICALS) ×2 IMPLANT
TUBE ENDOVIVE SAFETY PEG 22FR (TUBING) ×1 IMPLANT
WATER STERILE IRR 1000ML POUR (IV SOLUTION) ×3 IMPLANT
WIRE 24 GAUGE OMINIMAX MMF (WIRE) ×1 IMPLANT
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
## 2020-05-26 NOTE — Progress Notes (Signed)
Patient HR sustaining in the 120-140s with a low BP in the 80s. I called trauma with concerns. Byerly ordered CBC, EKG and Metoprolol. EKG showed Afib RVR. Levo was also restarted to maintain BP parameters. Amiodarone bolus given. Patient still remains in Afib.   Hart [place], RN

## 2020-05-26 NOTE — Progress Notes (Signed)
PT Cancellation Note  Patient Details Name: Daniel [name] MRN: [id number] DOB: 1941-03-30   Cancelled Treatment:    Reason Eval/Treat Not Completed: Patient at procedure or test/unavailable;Medical issues which prohibited therapy.  Pt heading to surgery for PEG and mandibular sx.  Will likely hold today and see tomorrow as appropriate. 05/26/2020  Ginger Carne., PT Acute Rehabilitation Services [phone]  (pager) [phone]  (office)    [name] 05/26/2020, 10:59 AM
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
## 2020-05-26 NOTE — Progress Notes (Signed)
OT Cancellation    05/26/20 1100  OT Visit Information  Last OT Received On 05/26/20  Reason Eval/Treat Not Completed Patient not medically ready (Pt having Peg and ORIF of Mandible.)  Maurie Boettcher, OT/L   Acute OT Clinical Specialist [place] Pager [phone] Office [phone]
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
## 2020-05-26 NOTE — Anesthesia Postprocedure Evaluation (Signed)
Anesthesia Post Note  Patient: Daniel [name]  Procedure(s) Performed: TRACHEOSTOMY (N/A Neck)     Patient location during evaluation: ICU Level of consciousness: patient remains intubated per anesthesia plan Pain management: pain level controlled Vital Signs Assessment: post-procedure vital signs reviewed and stable Respiratory status: patient remains intubated per anesthesia plan Cardiovascular status: stable Postop Assessment: no apparent nausea or vomiting Anesthetic complications: no   No complications documented.  Last Vitals:  Vitals:   05/26/20 1105 05/26/20 1200  BP:    Pulse: (!) 115   Resp: 12   Temp:  37.2 C  SpO2: 95%     Last Pain:  Vitals:   05/26/20 1200  TempSrc: Axillary  PainSc:                  Dustine Stickler

## 2020-05-26 NOTE — Progress Notes (Signed)
Discussed operative plan with Dr. Marcelline Deist and plan is for MMF. Will place PEG in anticipation of prolonged dysphagia. Informed consent obtained.   Jesusita Oka, MD General and [facility]

## 2020-05-26 NOTE — Transfer of Care (Signed)
Immediate Anesthesia Transfer of Care Note  Patient: Daniel [name]  Procedure(s) Performed: OPEN REDUCTION INTERNAL FIXATION (ORIF) MANDIBULAR FRACTURE (Right ) CLOSED REDUCTION MANDIBLE WITH MANDIBULOMAXILLARY FUSION (Right ) PEG Placement (N/A ) ESOPHAGOGASTRODUODENOSCOPY (EGD) (N/A )  Patient Location: ICU  Anesthesia Type:General  Level of Consciousness: sedated and Patient remains intubated per anesthesia plan  Airway & Oxygen Therapy: Patient remains intubated per anesthesia plan and Patient placed on Ventilator (see vital sign flow sheet for setting)  Post-op Assessment: Report given to RN and Post -op Vital signs reviewed and stable  Post vital signs: Reviewed and stable  Last Vitals:  Vitals Value Taken Time  BP 130/98 05/26/20 1512  Temp    Pulse 100 05/26/20 1519  Resp 6 05/26/20 1519  SpO2 95 % 05/26/20 1519  Vitals shown include unvalidated device data.  Last Pain:  Vitals:   05/26/20 1200  TempSrc: Axillary  PainSc:          Complications: No complications documented.

## 2020-05-26 NOTE — Anesthesia Postprocedure Evaluation (Signed)
Anesthesia Post Note  Patient: Daniel [name]  Procedure(s) Performed: OPEN REDUCTION INTERNAL FIXATION (ORIF) MANDIBULAR FRACTURE (Right ) CLOSED REDUCTION MANDIBLE WITH MANDIBULOMAXILLARY FUSION (Right ) PEG Placement (N/A ) ESOPHAGOGASTRODUODENOSCOPY (EGD) (N/A )     Patient location during evaluation: ICU Anesthesia Type: General Level of consciousness: sedated and patient remains intubated per anesthesia plan Pain management: pain level controlled Vital Signs Assessment: post-procedure vital signs reviewed and stable Respiratory status: patient remains intubated per anesthesia plan Cardiovascular status: blood pressure returned to baseline and stable Anesthetic complications: no   No complications documented.  Last Vitals:  Vitals:   05/26/20 1200 05/26/20 1513  BP:    Pulse:  95  Resp:  14  Temp: 37.2 C   SpO2:  98%    Last Pain:  Vitals:   05/26/20 1200  TempSrc: Axillary  PainSc:                  Pervis Hocking
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
## 2020-05-27 LAB — BASIC METABOLIC PANEL
Anion gap: 9 (ref 5–15)
BUN: 17 mg/dL (ref 8–23)
CO2: 25 mmol/L (ref 22–32)
Calcium: 7.8 mg/dL — ABNORMAL LOW (ref 8.9–10.3)
Chloride: 104 mmol/L (ref 98–111)
Creatinine, Ser: 1.52 mg/dL — ABNORMAL HIGH (ref 0.61–1.24)
GFR, Estimated: 47 mL/min — ABNORMAL LOW (ref >60)
Glucose, Bld: 176 mg/dL — ABNORMAL HIGH (ref 70–99)
Potassium: 3.3 mmol/L — ABNORMAL LOW (ref 3.5–5.1)
Sodium: 138 mmol/L (ref 135–145)

## 2020-05-27 LAB — CBC
HCT: 36.2 % — ABNORMAL LOW (ref 39.0–52.0)
Hemoglobin: 11.2 g/dL — ABNORMAL LOW (ref 13.0–17.0)
MCH: 26.9 pg (ref 26.0–34.0)
MCHC: 30.9 g/dL (ref 30.0–36.0)
MCV: 87 fL (ref 80.0–100.0)
Platelets: 187 10*3/uL (ref 150–400)
RBC: 4.16 MIL/uL — ABNORMAL LOW (ref 4.22–5.81)
RDW: 15.6 % — ABNORMAL HIGH (ref 11.5–15.5)
WBC: 11.2 10*3/uL — ABNORMAL HIGH (ref 4.0–10.5)
nRBC: 0 % (ref 0.0–0.2)

## 2020-05-27 LAB — BLOOD PRODUCT ORDER (VERBAL) VERIFICATION

## 2020-05-27 LAB — GLUCOSE, CAPILLARY
Glucose-Capillary: 164 mg/dL — ABNORMAL HIGH (ref 70–99)
Glucose-Capillary: 160 mg/dL — ABNORMAL HIGH (ref 70–99)
Glucose-Capillary: 178 mg/dL — ABNORMAL HIGH (ref 70–99)
Glucose-Capillary: 152 mg/dL — ABNORMAL HIGH (ref 70–99)
Glucose-Capillary: 144 mg/dL — ABNORMAL HIGH (ref 70–99)

## 2020-05-27 LAB — TRIGLYCERIDES: Triglycerides: 72 mg/dL (ref <150)

## 2020-05-27 MED ORDER — QUETIAPINE FUMARATE 25 MG PO TABS
50.0000 mg | ORAL_TABLET | Freq: Two times a day (BID) | ORAL | Status: DC
  Administered 2020-05-27 – 2020-06-01 (×10): 50 mg
  Filled 2020-05-27 (×10): qty 2

## 2020-05-27 MED ORDER — CLONAZEPAM 0.5 MG PO TABS
0.5000 mg | ORAL_TABLET | Freq: Two times a day (BID) | ORAL | Status: DC
  Administered 2020-05-27 – 2020-06-01 (×10): 0.5 mg
  Filled 2020-05-27 (×10): qty 1

## 2020-05-27 MED ORDER — CARVEDILOL 12.5 MG PO TABS
12.5000 mg | ORAL_TABLET | Freq: Two times a day (BID) | ORAL | Status: DC
  Administered 2020-05-27 – 2020-05-29 (×5): 12.5 mg
  Filled 2020-05-27 (×5): qty 1

## 2020-05-27 MED ORDER — METOPROLOL TARTRATE 5 MG/5ML IV SOLN
5.0000 mg | Freq: Four times a day (QID) | INTRAVENOUS | Status: DC | PRN
  Administered 2020-05-31 – 2020-06-08 (×6): 5 mg via INTRAVENOUS
  Filled 2020-05-27 (×6): qty 5

## 2020-05-27 MED ORDER — ALBUMIN HUMAN 5 % IV SOLN
12.5000 g | Freq: Once | INTRAVENOUS | Status: AC
  Administered 2020-05-27: 12.5 g via INTRAVENOUS
  Filled 2020-05-27: qty 250

## 2020-05-27 MED ORDER — POTASSIUM CHLORIDE 20 MEQ PO PACK
40.0000 meq | PACK | Freq: Once | ORAL | Status: AC
  Administered 2020-05-27: 40 meq
  Filled 2020-05-27: qty 2

## 2020-05-27 MED ORDER — PANTOPRAZOLE SODIUM 40 MG PO PACK
40.0000 mg | PACK | Freq: Every day | ORAL | Status: DC
  Administered 2020-05-28 – 2020-06-28 (×32): 40 mg
  Filled 2020-05-27 (×32): qty 20

## 2020-05-27 MED ORDER — SODIUM CHLORIDE 0.9 % IV SOLN: INTRAVENOUS | Status: DC

## 2020-05-27 MED ORDER — POTASSIUM CHLORIDE IN NACL 20-0.9 MEQ/L-% IV SOLN
INTRAVENOUS | Status: DC
  Filled 2020-05-27 (×9): qty 1000

## 2020-05-27 MED ORDER — BISACODYL 10 MG RE SUPP
10.0000 mg | Freq: Every day | RECTAL | Status: DC | PRN
  Administered 2020-05-28 – 2020-06-01 (×2): 10 mg via RECTAL
  Filled 2020-05-27 (×2): qty 1
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
## 2020-05-27 NOTE — Progress Notes (Signed)
PT Cancellation Note  Patient Details Name: Giovan Pinsky MRN: [id number] DOB: [birth year]   Cancelled Treatment:    Reason Eval/Treat Not Completed: Medical issues which prohibited therapy  HR 150s, elevated BP. RN to give meds. Will reattempt evaluation later today as appropriate.   Arby Barrette, PT Pager [phone]  Rexanne Mano 05/27/2020, 9:21 AM

## 2020-05-27 NOTE — Progress Notes (Signed)
OT Cancellation Note  Patient Details Name: Tremel Setters MRN: [id number] DOB: 1940/07/10   Cancelled Treatment:    Reason Eval/Treat Not Completed: Medical issues which prohibited therapy (HR in the 150s). Will follow up later today if appropriate.   Ramond Dial, OT/L   Acute OT Clinical Specialist Acute Rehabilitation Services Pager [phone] Office [phone]  05/27/2020, 9:21 AM
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
## 2020-05-27 NOTE — Progress Notes (Signed)
Rehab Admissions Coordinator Note:  Per PT and OT recommendation, this patient was screened by Raechel Ache for appropriateness for an Inpatient Acute Rehab Consult.  Noted pt is total A for bed mobility and ADLs and has not yet transferred out of bed. At this time, we will follow for progress with therapies. If pt making suitable gains, will then place consult order.  Raechel Ache 05/27/2020, 2:19 PM   I can be reached at [phone].

## 2020-05-28 ENCOUNTER — Inpatient Hospital Stay: Payer: Self-pay

## 2020-05-28 LAB — CBC
HCT: 33.6 % — ABNORMAL LOW (ref 39.0–52.0)
Hemoglobin: 10.2 g/dL — ABNORMAL LOW (ref 13.0–17.0)
MCH: 26.7 pg (ref 26.0–34.0)
MCHC: 30.4 g/dL (ref 30.0–36.0)
MCV: 88 fL (ref 80.0–100.0)
Platelets: 161 10*3/uL (ref 150–400)
RBC: 3.82 MIL/uL — ABNORMAL LOW (ref 4.22–5.81)
RDW: 15.9 % — ABNORMAL HIGH (ref 11.5–15.5)
WBC: 9.3 10*3/uL (ref 4.0–10.5)
nRBC: 0 % (ref 0.0–0.2)

## 2020-05-28 LAB — GLUCOSE, CAPILLARY
Glucose-Capillary: 115 mg/dL — ABNORMAL HIGH (ref 70–99)
Glucose-Capillary: 149 mg/dL — ABNORMAL HIGH (ref 70–99)
Glucose-Capillary: 131 mg/dL — ABNORMAL HIGH (ref 70–99)
Glucose-Capillary: 153 mg/dL — ABNORMAL HIGH (ref 70–99)
Glucose-Capillary: 126 mg/dL — ABNORMAL HIGH (ref 70–99)
Glucose-Capillary: 143 mg/dL — ABNORMAL HIGH (ref 70–99)

## 2020-05-28 LAB — BASIC METABOLIC PANEL
Anion gap: 12 (ref 5–15)
BUN: 18 mg/dL (ref 8–23)
CO2: 22 mmol/L (ref 22–32)
Calcium: 7.9 mg/dL — ABNORMAL LOW (ref 8.9–10.3)
Chloride: 108 mmol/L (ref 98–111)
Creatinine, Ser: 1.64 mg/dL — ABNORMAL HIGH (ref 0.61–1.24)
GFR, Estimated: 43 mL/min — ABNORMAL LOW (ref >60)
Glucose, Bld: 116 mg/dL — ABNORMAL HIGH (ref 70–99)
Potassium: 3.4 mmol/L — ABNORMAL LOW (ref 3.5–5.1)
Sodium: 142 mmol/L (ref 135–145)

## 2020-05-28 MED ORDER — POTASSIUM CHLORIDE 20 MEQ PO PACK
40.0000 meq | PACK | Freq: Once | ORAL | Status: AC
  Administered 2020-05-28: 40 meq
  Filled 2020-05-28: qty 2

## 2020-05-28 MED ORDER — ALBUMIN HUMAN 5 % IV SOLN
12.5000 g | Freq: Once | INTRAVENOUS | Status: AC
  Administered 2020-05-28: 12.5 g via INTRAVENOUS
  Filled 2020-05-28: qty 250

## 2020-05-28 MED ORDER — APIXABAN 5 MG PO TABS
5.0000 mg | ORAL_TABLET | Freq: Two times a day (BID) | ORAL | Status: DC
  Administered 2020-05-28 – 2020-06-28 (×62): 5 mg
  Filled 2020-05-28 (×62): qty 1
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
## 2020-05-28 NOTE — Progress Notes (Signed)
   05/28/20 0844  Adult Ventilator Settings  Vent Mode (S)  PSV;CPAP (12/5, 30%, HR 130s, Sp02 dropped to 94%, VTs 200s, pt failed wean, placed back on PRVC, previous vent settings.)

## 2020-05-29 ENCOUNTER — Inpatient Hospital Stay (HOSPITAL_COMMUNITY): Payer: Medicare Other

## 2020-05-29 LAB — BASIC METABOLIC PANEL
Anion gap: 11 (ref 5–15)
BUN: 25 mg/dL — ABNORMAL HIGH (ref 8–23)
CO2: 22 mmol/L (ref 22–32)
Calcium: 8 mg/dL — ABNORMAL LOW (ref 8.9–10.3)
Chloride: 109 mmol/L (ref 98–111)
Creatinine, Ser: 1.65 mg/dL — ABNORMAL HIGH (ref 0.61–1.24)
GFR, Estimated: 42 mL/min — ABNORMAL LOW (ref >60)
Glucose, Bld: 164 mg/dL — ABNORMAL HIGH (ref 70–99)
Potassium: 4 mmol/L (ref 3.5–5.1)
Sodium: 142 mmol/L (ref 135–145)

## 2020-05-29 LAB — CBC
HCT: 35.2 % — ABNORMAL LOW (ref 39.0–52.0)
Hemoglobin: 10.6 g/dL — ABNORMAL LOW (ref 13.0–17.0)
MCH: 26.7 pg (ref 26.0–34.0)
MCHC: 30.1 g/dL (ref 30.0–36.0)
MCV: 88.7 fL (ref 80.0–100.0)
Platelets: 201 10*3/uL (ref 150–400)
RBC: 3.97 MIL/uL — ABNORMAL LOW (ref 4.22–5.81)
RDW: 16.1 % — ABNORMAL HIGH (ref 11.5–15.5)
WBC: 10.3 10*3/uL (ref 4.0–10.5)
nRBC: 0 % (ref 0.0–0.2)

## 2020-05-29 LAB — GLUCOSE, CAPILLARY
Glucose-Capillary: 116 mg/dL — ABNORMAL HIGH (ref 70–99)
Glucose-Capillary: 133 mg/dL — ABNORMAL HIGH (ref 70–99)
Glucose-Capillary: 149 mg/dL — ABNORMAL HIGH (ref 70–99)
Glucose-Capillary: 155 mg/dL — ABNORMAL HIGH (ref 70–99)
Glucose-Capillary: 160 mg/dL — ABNORMAL HIGH (ref 70–99)
Glucose-Capillary: 182 mg/dL — ABNORMAL HIGH (ref 70–99)
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
## 2020-05-29 NOTE — Discharge Instructions (Signed)

## 2020-05-29 NOTE — Plan of Care (Signed)
  Problem: Education: Goal: Knowledge of General Education information will improve Description: Including pain rating scale, medication(s)/side effects and non-pharmacologic comfort measures Outcome: Progressing   Problem: Nutrition: Goal: Adequate nutrition will be maintained Outcome: Progressing   Problem: Coping: Goal: Level of anxiety will decrease Outcome: Progressing   Problem: Skin Integrity: Goal: Risk for impaired skin integrity will decrease Outcome: Not Progressing

## 2020-05-29 NOTE — Progress Notes (Signed)
   05/29/20 0645  Clinical Encounter Type  Visited With Patient not available  Visit Type Code  Referral From Nurse  Consult/Referral To Chaplain  Chaplain responded to code. The patient's family was not present. The Chaplain remains available if needed. This note was prepared by Jeanine Luz, M.Div..  For questions please contact by phone [phone].
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
## 2020-05-30 LAB — BASIC METABOLIC PANEL
Anion gap: 9 (ref 5–15)
BUN: 29 mg/dL — ABNORMAL HIGH (ref 8–23)
CO2: 23 mmol/L (ref 22–32)
Calcium: 7.9 mg/dL — ABNORMAL LOW (ref 8.9–10.3)
Chloride: 112 mmol/L — ABNORMAL HIGH (ref 98–111)
Creatinine, Ser: 1.55 mg/dL — ABNORMAL HIGH (ref 0.61–1.24)
GFR, Estimated: 46 mL/min — ABNORMAL LOW (ref >60)
Glucose, Bld: 179 mg/dL — ABNORMAL HIGH (ref 70–99)
Potassium: 4.2 mmol/L (ref 3.5–5.1)
Sodium: 144 mmol/L (ref 135–145)

## 2020-05-30 LAB — CBC
HCT: 33.9 % — ABNORMAL LOW (ref 39.0–52.0)
HCT: 38.9 % — ABNORMAL LOW (ref 39.0–52.0)
Hemoglobin: 9.9 g/dL — ABNORMAL LOW (ref 13.0–17.0)
Hemoglobin: 11.6 g/dL — ABNORMAL LOW (ref 13.0–17.0)
MCH: 26.3 pg (ref 26.0–34.0)
MCH: 27 pg (ref 26.0–34.0)
MCHC: 29.2 g/dL — ABNORMAL LOW (ref 30.0–36.0)
MCHC: 29.8 g/dL — ABNORMAL LOW (ref 30.0–36.0)
MCV: 90.2 fL (ref 80.0–100.0)
MCV: 90.5 fL (ref 80.0–100.0)
Platelets: 187 10*3/uL (ref 150–400)
Platelets: 225 10*3/uL (ref 150–400)
RBC: 3.76 MIL/uL — ABNORMAL LOW (ref 4.22–5.81)
RBC: 4.3 MIL/uL (ref 4.22–5.81)
RDW: 16.3 % — ABNORMAL HIGH (ref 11.5–15.5)
RDW: 16.3 % — ABNORMAL HIGH (ref 11.5–15.5)
WBC: 8.4 10*3/uL (ref 4.0–10.5)
WBC: 11.3 10*3/uL — ABNORMAL HIGH (ref 4.0–10.5)
nRBC: 0 % (ref 0.0–0.2)
nRBC: 0 % (ref 0.0–0.2)

## 2020-05-30 LAB — GLUCOSE, CAPILLARY
Glucose-Capillary: 117 mg/dL — ABNORMAL HIGH (ref 70–99)
Glucose-Capillary: 123 mg/dL — ABNORMAL HIGH (ref 70–99)
Glucose-Capillary: 149 mg/dL — ABNORMAL HIGH (ref 70–99)
Glucose-Capillary: 160 mg/dL — ABNORMAL HIGH (ref 70–99)
Glucose-Capillary: 165 mg/dL — ABNORMAL HIGH (ref 70–99)
Glucose-Capillary: 173 mg/dL — ABNORMAL HIGH (ref 70–99)

## 2020-05-30 LAB — MAGNESIUM: Magnesium: 2 mg/dL (ref 1.7–2.4)

## 2020-05-30 LAB — HEMOGLOBIN A1C
Hgb A1c MFr Bld: 6.1 % — ABNORMAL HIGH (ref 4.8–5.6)
Mean Plasma Glucose: 128.37 mg/dL

## 2020-05-30 MED ORDER — INSULIN ASPART 100 UNIT/ML ~~LOC~~ SOLN
0.0000 [IU] | SUBCUTANEOUS | Status: DC
  Administered 2020-05-30 – 2020-06-16 (×6): 1 [IU] via SUBCUTANEOUS
  Administered 2020-06-17: 2 [IU] via SUBCUTANEOUS
  Administered 2020-06-19 – 2020-06-25 (×4): 1 [IU] via SUBCUTANEOUS

## 2020-05-30 MED ORDER — IPRATROPIUM-ALBUTEROL 0.5-2.5 (3) MG/3ML IN SOLN
3.0000 mL | Freq: Once | RESPIRATORY_TRACT | Status: AC
  Administered 2020-05-30: 3 mL via RESPIRATORY_TRACT

## 2020-05-30 MED ORDER — IPRATROPIUM-ALBUTEROL 0.5-2.5 (3) MG/3ML IN SOLN
3.0000 mL | Freq: Four times a day (QID) | RESPIRATORY_TRACT | Status: AC
  Administered 2020-05-30 – 2020-06-01 (×8): 3 mL via RESPIRATORY_TRACT
  Filled 2020-05-30 (×8): qty 3

## 2020-05-30 NOTE — Progress Notes (Signed)
Pt sats dropped to 79 while on trach collar. This RN came into room and began suctioning trach and mouth. Sats dropped into 50s, began bagging while another RN prepared to place him back on vent. Once returned to vent pt's sats began rising into the 90s.  Obtained EKG. Alerted Dr. Redmond Pulling who provided no new orders.
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
## 2020-05-30 NOTE — Progress Notes (Signed)
RT note- Called for sp02 in the 50's, upon arrival patient had already been placed back to the ventilator.PIP's elevated 40+, nebulizer given at this time, Dr. Redmond Pulling aware for one time treatment. BBS diminished with slight expiratory wheeze. PIP's now 62, continue to monitor.

## 2020-05-30 NOTE — Progress Notes (Signed)
Pt's BP dropping 70s/40s (50s). Restarted levo and contacted MD. MD provided VO for CBC. Contacted lab to request add-on to recently drawn A1C.

## 2020-05-31 ENCOUNTER — Inpatient Hospital Stay (HOSPITAL_COMMUNITY): Payer: Medicare Other

## 2020-05-31 DIAGNOSIS — L8990 Pressure ulcer of unspecified site, unspecified stage: Secondary | ICD-10-CM | POA: Insufficient documentation

## 2020-05-31 LAB — BASIC METABOLIC PANEL
Anion gap: 10 (ref 5–15)
BUN: 34 mg/dL — ABNORMAL HIGH (ref 8–23)
CO2: 23 mmol/L (ref 22–32)
Calcium: 8.4 mg/dL — ABNORMAL LOW (ref 8.9–10.3)
Chloride: 110 mmol/L (ref 98–111)
Creatinine, Ser: 1.72 mg/dL — ABNORMAL HIGH (ref 0.61–1.24)
GFR, Estimated: 40 mL/min — ABNORMAL LOW (ref >60)
Glucose, Bld: 147 mg/dL — ABNORMAL HIGH (ref 70–99)
Potassium: 4.1 mmol/L (ref 3.5–5.1)
Sodium: 143 mmol/L (ref 135–145)

## 2020-05-31 LAB — CBC
HCT: 32.7 % — ABNORMAL LOW (ref 39.0–52.0)
Hemoglobin: 9.6 g/dL — ABNORMAL LOW (ref 13.0–17.0)
MCH: 26.6 pg (ref 26.0–34.0)
MCHC: 29.4 g/dL — ABNORMAL LOW (ref 30.0–36.0)
MCV: 90.6 fL (ref 80.0–100.0)
Platelets: 188 10*3/uL (ref 150–400)
RBC: 3.61 MIL/uL — ABNORMAL LOW (ref 4.22–5.81)
RDW: 16.5 % — ABNORMAL HIGH (ref 11.5–15.5)
WBC: 8.4 10*3/uL (ref 4.0–10.5)
nRBC: 0 % (ref 0.0–0.2)

## 2020-05-31 LAB — GLUCOSE, CAPILLARY
Glucose-Capillary: 112 mg/dL — ABNORMAL HIGH (ref 70–99)
Glucose-Capillary: 139 mg/dL — ABNORMAL HIGH (ref 70–99)
Glucose-Capillary: 153 mg/dL — ABNORMAL HIGH (ref 70–99)
Glucose-Capillary: 144 mg/dL — ABNORMAL HIGH (ref 70–99)
Glucose-Capillary: 93 mg/dL (ref 70–99)
Glucose-Capillary: 81 mg/dL (ref 70–99)

## 2020-05-31 MED ORDER — FUROSEMIDE 10 MG/ML IJ SOLN
40.0000 mg | Freq: Once | INTRAMUSCULAR | Status: AC
  Administered 2020-05-31: 40 mg via INTRAVENOUS
  Filled 2020-05-31: qty 4

## 2020-05-31 MED ORDER — CARVEDILOL 12.5 MG PO TABS
12.5000 mg | ORAL_TABLET | Freq: Two times a day (BID) | ORAL | Status: DC
  Administered 2020-05-31 – 2020-06-08 (×16): 12.5 mg
  Filled 2020-05-31 (×16): qty 1

## 2020-05-31 MED ORDER — PROCHLORPERAZINE EDISYLATE 10 MG/2ML IJ SOLN
10.0000 mg | Freq: Four times a day (QID) | INTRAMUSCULAR | Status: DC | PRN
  Administered 2020-05-31: 10 mg via INTRAVENOUS
  Filled 2020-05-31: qty 2

## 2020-05-31 MED ORDER — BISACODYL 10 MG RE SUPP
10.0000 mg | Freq: Once | RECTAL | Status: AC
  Administered 2020-05-31: 10 mg via RECTAL
  Filled 2020-05-31: qty 1
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
## 2020-05-31 NOTE — Progress Notes (Signed)
Dr. Zenia Resides paged regarding the completion of the patient's xray. He also continues to gag and feel nauseous. No new orders given. Thayer Ohm D

## 2020-05-31 NOTE — Progress Notes (Signed)
Patient started gagging. He nodded "YES" that he was feeling nauseous. Tube feedings turned off and PEG tube placed to LIWS. Zofran given. Wire cutters at bedside. His belly is significantly more distended that at the beginning of the day. Bowel sounds are active. He had a large bowl movement earlier today. Trauma MDs paged. See chart for orders. Thayer Ohm D

## 2020-05-31 NOTE — Progress Notes (Signed)
Pt back on full support at previous settings due to increased wob.

## 2020-05-31 NOTE — Progress Notes (Signed)
Abdominal XR reviewed. There is colonic distension but no significant small bowel distension. Stomach is decompressed. Continue decompressing via PEG tube, hold feeds. Suppository ordered.
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
## 2020-06-01 ENCOUNTER — Encounter (HOSPITAL_COMMUNITY): Payer: Self-pay | Admitting: Otolaryngology

## 2020-06-01 ENCOUNTER — Inpatient Hospital Stay (HOSPITAL_COMMUNITY): Payer: Medicare Other

## 2020-06-01 LAB — CBC
HCT: 35.5 % — ABNORMAL LOW (ref 39.0–52.0)
Hemoglobin: 10.3 g/dL — ABNORMAL LOW (ref 13.0–17.0)
MCH: 26.3 pg (ref 26.0–34.0)
MCHC: 29 g/dL — ABNORMAL LOW (ref 30.0–36.0)
MCV: 90.8 fL (ref 80.0–100.0)
Platelets: 269 10*3/uL (ref 150–400)
RBC: 3.91 MIL/uL — ABNORMAL LOW (ref 4.22–5.81)
RDW: 16.8 % — ABNORMAL HIGH (ref 11.5–15.5)
WBC: 9.1 10*3/uL (ref 4.0–10.5)
nRBC: 0 % (ref 0.0–0.2)

## 2020-06-01 LAB — BASIC METABOLIC PANEL
Anion gap: 9 (ref 5–15)
BUN: 37 mg/dL — ABNORMAL HIGH (ref 8–23)
CO2: 25 mmol/L (ref 22–32)
Calcium: 8.8 mg/dL — ABNORMAL LOW (ref 8.9–10.3)
Chloride: 112 mmol/L — ABNORMAL HIGH (ref 98–111)
Creatinine, Ser: 1.91 mg/dL — ABNORMAL HIGH (ref 0.61–1.24)
GFR, Estimated: 35 mL/min — ABNORMAL LOW (ref >60)
Glucose, Bld: 120 mg/dL — ABNORMAL HIGH (ref 70–99)
Potassium: 4.3 mmol/L (ref 3.5–5.1)
Sodium: 146 mmol/L — ABNORMAL HIGH (ref 135–145)

## 2020-06-01 LAB — GLUCOSE, CAPILLARY
Glucose-Capillary: 91 mg/dL (ref 70–99)
Glucose-Capillary: 96 mg/dL (ref 70–99)
Glucose-Capillary: 97 mg/dL (ref 70–99)
Glucose-Capillary: 67 mg/dL — ABNORMAL LOW (ref 70–99)
Glucose-Capillary: 121 mg/dL — ABNORMAL HIGH (ref 70–99)
Glucose-Capillary: 92 mg/dL (ref 70–99)
Glucose-Capillary: 104 mg/dL — ABNORMAL HIGH (ref 70–99)

## 2020-06-01 LAB — MAGNESIUM: Magnesium: 2.2 mg/dL (ref 1.7–2.4)

## 2020-06-01 LAB — PHOSPHORUS: Phosphorus: 5.3 mg/dL — ABNORMAL HIGH (ref 2.5–4.6)

## 2020-06-01 MED ORDER — FREE WATER
200.0000 mL | Freq: Three times a day (TID) | Status: DC
  Administered 2020-06-01 – 2020-06-03 (×5): 200 mL

## 2020-06-01 MED ORDER — DEXTROSE 50 % IV SOLN
12.5000 g | INTRAVENOUS | Status: AC
  Administered 2020-06-01: 12.5 g via INTRAVENOUS
  Filled 2020-06-01: qty 50

## 2020-06-01 MED ORDER — FUROSEMIDE 10 MG/ML IJ SOLN
20.0000 mg | Freq: Once | INTRAMUSCULAR | Status: AC
  Administered 2020-06-01: 20 mg via INTRAVENOUS
  Filled 2020-06-01: qty 2

## 2020-06-01 MED ORDER — BISACODYL 10 MG RE SUPP
10.0000 mg | Freq: Every day | RECTAL | Status: DC
  Administered 2020-06-02 – 2020-06-13 (×11): 10 mg via RECTAL
  Filled 2020-06-01 (×10): qty 1
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
## 2020-06-02 ENCOUNTER — Inpatient Hospital Stay (HOSPITAL_COMMUNITY): Payer: Medicare Other

## 2020-06-02 LAB — BASIC METABOLIC PANEL
Anion gap: 10 (ref 5–15)
Anion gap: 11 (ref 5–15)
BUN: 35 mg/dL — ABNORMAL HIGH (ref 8–23)
BUN: 35 mg/dL — ABNORMAL HIGH (ref 8–23)
CO2: 23 mmol/L (ref 22–32)
CO2: 24 mmol/L (ref 22–32)
Calcium: 8.9 mg/dL (ref 8.9–10.3)
Calcium: 9.2 mg/dL (ref 8.9–10.3)
Chloride: 113 mmol/L — ABNORMAL HIGH (ref 98–111)
Chloride: 112 mmol/L — ABNORMAL HIGH (ref 98–111)
Creatinine, Ser: 1.78 mg/dL — ABNORMAL HIGH (ref 0.61–1.24)
Creatinine, Ser: 1.79 mg/dL — ABNORMAL HIGH (ref 0.61–1.24)
GFR, Estimated: 39 mL/min — ABNORMAL LOW (ref >60)
GFR, Estimated: 38 mL/min — ABNORMAL LOW (ref >60)
Glucose, Bld: 94 mg/dL (ref 70–99)
Glucose, Bld: 124 mg/dL — ABNORMAL HIGH (ref 70–99)
Potassium: 4 mmol/L (ref 3.5–5.1)
Potassium: 4 mmol/L (ref 3.5–5.1)
Sodium: 146 mmol/L — ABNORMAL HIGH (ref 135–145)
Sodium: 147 mmol/L — ABNORMAL HIGH (ref 135–145)

## 2020-06-02 LAB — CBC
HCT: 34.1 % — ABNORMAL LOW (ref 39.0–52.0)
Hemoglobin: 9.9 g/dL — ABNORMAL LOW (ref 13.0–17.0)
MCH: 26.3 pg (ref 26.0–34.0)
MCHC: 29 g/dL — ABNORMAL LOW (ref 30.0–36.0)
MCV: 90.7 fL (ref 80.0–100.0)
Platelets: 256 10*3/uL (ref 150–400)
RBC: 3.76 MIL/uL — ABNORMAL LOW (ref 4.22–5.81)
RDW: 16.6 % — ABNORMAL HIGH (ref 11.5–15.5)
WBC: 8.4 10*3/uL (ref 4.0–10.5)
nRBC: 0 % (ref 0.0–0.2)

## 2020-06-02 LAB — GLUCOSE, CAPILLARY
Glucose-Capillary: 110 mg/dL — ABNORMAL HIGH (ref 70–99)
Glucose-Capillary: 110 mg/dL — ABNORMAL HIGH (ref 70–99)
Glucose-Capillary: 110 mg/dL — ABNORMAL HIGH (ref 70–99)
Glucose-Capillary: 128 mg/dL — ABNORMAL HIGH (ref 70–99)
Glucose-Capillary: 87 mg/dL (ref 70–99)
Glucose-Capillary: 96 mg/dL (ref 70–99)

## 2020-06-02 LAB — MAGNESIUM
Magnesium: 2 mg/dL (ref 1.7–2.4)
Magnesium: 2.1 mg/dL (ref 1.7–2.4)

## 2020-06-02 LAB — PHOSPHORUS
Phosphorus: 5.2 mg/dL — ABNORMAL HIGH (ref 2.5–4.6)
Phosphorus: 5.9 mg/dL — ABNORMAL HIGH (ref 2.5–4.6)

## 2020-06-02 MED ORDER — FUROSEMIDE 10 MG/ML IJ SOLN
40.0000 mg | Freq: Once | INTRAMUSCULAR | Status: AC
  Administered 2020-06-02: 40 mg via INTRAVENOUS
  Filled 2020-06-02: qty 4

## 2020-06-02 MED ORDER — GLYCOPYRROLATE 0.2 MG/ML IJ SOLN
0.1000 mg | Freq: Three times a day (TID) | INTRAMUSCULAR | Status: DC
  Administered 2020-06-02 – 2020-06-09 (×20): 0.1 mg via INTRAVENOUS
  Filled 2020-06-02 (×20): qty 1

## 2020-06-02 MED ORDER — PIVOT 1.5 CAL PO LIQD
1000.0000 mL | ORAL | Status: DC
  Administered 2020-06-02 – 2020-06-04 (×2): 1000 mL
  Filled 2020-06-02: qty 1000
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
## 2020-06-02 NOTE — Progress Notes (Signed)
RT placed pt back on vent due to desatting into the 80s.

## 2020-06-02 NOTE — Procedures (Signed)
Tracheostomy Change Note  Patient Details:   Name: Daniel [name] DOB: 08/15/1940 MRN: [id number]    Airway Documentation:     Evaluation  O2 sats: stable throughout Complications: No apparent complications Patient did tolerate procedure well. Bilateral Breath Sounds: Rhonchi    RT removed sutures from trach site per MD order. No complications. Will cont. To monitor.    Jisela Merlino Susa Raring 06/02/2020, 4:53 PM

## 2020-06-03 LAB — CBC
HCT: 35 % — ABNORMAL LOW (ref 39.0–52.0)
Hemoglobin: 10.2 g/dL — ABNORMAL LOW (ref 13.0–17.0)
MCH: 26.4 pg (ref 26.0–34.0)
MCHC: 29.1 g/dL — ABNORMAL LOW (ref 30.0–36.0)
MCV: 90.4 fL (ref 80.0–100.0)
Platelets: 298 10*3/uL (ref 150–400)
RBC: 3.87 MIL/uL — ABNORMAL LOW (ref 4.22–5.81)
RDW: 16.4 % — ABNORMAL HIGH (ref 11.5–15.5)
WBC: 10.1 10*3/uL (ref 4.0–10.5)
nRBC: 0 % (ref 0.0–0.2)

## 2020-06-03 LAB — PHOSPHORUS: Phosphorus: 5.2 mg/dL — ABNORMAL HIGH (ref 2.5–4.6)

## 2020-06-03 LAB — GLUCOSE, CAPILLARY
Glucose-Capillary: 104 mg/dL — ABNORMAL HIGH (ref 70–99)
Glucose-Capillary: 110 mg/dL — ABNORMAL HIGH (ref 70–99)
Glucose-Capillary: 112 mg/dL — ABNORMAL HIGH (ref 70–99)
Glucose-Capillary: 115 mg/dL — ABNORMAL HIGH (ref 70–99)
Glucose-Capillary: 116 mg/dL — ABNORMAL HIGH (ref 70–99)
Glucose-Capillary: 94 mg/dL (ref 70–99)

## 2020-06-03 LAB — BASIC METABOLIC PANEL
Anion gap: 12 (ref 5–15)
BUN: 37 mg/dL — ABNORMAL HIGH (ref 8–23)
CO2: 25 mmol/L (ref 22–32)
Calcium: 8.9 mg/dL (ref 8.9–10.3)
Chloride: 111 mmol/L (ref 98–111)
Creatinine, Ser: 1.81 mg/dL — ABNORMAL HIGH (ref 0.61–1.24)
GFR, Estimated: 38 mL/min — ABNORMAL LOW (ref >60)
Glucose, Bld: 111 mg/dL — ABNORMAL HIGH (ref 70–99)
Potassium: 3.5 mmol/L (ref 3.5–5.1)
Sodium: 148 mmol/L — ABNORMAL HIGH (ref 135–145)

## 2020-06-03 LAB — MAGNESIUM: Magnesium: 2.1 mg/dL (ref 1.7–2.4)

## 2020-06-03 MED ORDER — FREE WATER
200.0000 mL | Freq: Four times a day (QID) | Status: DC
  Administered 2020-06-03 – 2020-06-04 (×4): 200 mL

## 2020-06-03 MED ORDER — POTASSIUM CHLORIDE 20 MEQ PO PACK
20.0000 meq | PACK | Freq: Once | ORAL | Status: AC
  Administered 2020-06-03: 20 meq
  Filled 2020-06-03: qty 1
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
## 2020-06-03 NOTE — Progress Notes (Signed)
Rehab Admissions Coordinator Note:  Patient was screened by Cleatrice Burke for appropriateness for an Inpatient Acute Rehab Consult per therapy recommendations.   At this time, we are recommending Inpatient Rehab consult. I will place order per protocol.  Cleatrice Burke RN MSN 06/03/2020, 1:36 PM  I can be reached at [phone].
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
## 2020-06-04 ENCOUNTER — Encounter: Payer: Medicare Other | Admitting: Internal Medicine

## 2020-06-04 LAB — BASIC METABOLIC PANEL
Anion gap: 14 (ref 5–15)
BUN: 39 mg/dL — ABNORMAL HIGH (ref 8–23)
CO2: 24 mmol/L (ref 22–32)
Calcium: 9 mg/dL (ref 8.9–10.3)
Chloride: 108 mmol/L (ref 98–111)
Creatinine, Ser: 1.97 mg/dL — ABNORMAL HIGH (ref 0.61–1.24)
GFR, Estimated: 34 mL/min — ABNORMAL LOW (ref >60)
Glucose, Bld: 92 mg/dL (ref 70–99)
Potassium: 3.4 mmol/L — ABNORMAL LOW (ref 3.5–5.1)
Sodium: 146 mmol/L — ABNORMAL HIGH (ref 135–145)

## 2020-06-04 LAB — GLUCOSE, CAPILLARY
Glucose-Capillary: 110 mg/dL — ABNORMAL HIGH (ref 70–99)
Glucose-Capillary: 120 mg/dL — ABNORMAL HIGH (ref 70–99)
Glucose-Capillary: 129 mg/dL — ABNORMAL HIGH (ref 70–99)
Glucose-Capillary: 170 mg/dL — ABNORMAL HIGH (ref 70–99)
Glucose-Capillary: 77 mg/dL (ref 70–99)
Glucose-Capillary: 77 mg/dL (ref 70–99)

## 2020-06-04 LAB — CBC
HCT: 34.5 % — ABNORMAL LOW (ref 39.0–52.0)
Hemoglobin: 10.3 g/dL — ABNORMAL LOW (ref 13.0–17.0)
MCH: 26.4 pg (ref 26.0–34.0)
MCHC: 29.9 g/dL — ABNORMAL LOW (ref 30.0–36.0)
MCV: 88.5 fL (ref 80.0–100.0)
Platelets: 301 10*3/uL (ref 150–400)
RBC: 3.9 MIL/uL — ABNORMAL LOW (ref 4.22–5.81)
RDW: 16.1 % — ABNORMAL HIGH (ref 11.5–15.5)
WBC: 9.6 10*3/uL (ref 4.0–10.5)
nRBC: 0 % (ref 0.0–0.2)

## 2020-06-04 LAB — PHOSPHORUS: Phosphorus: 3.6 mg/dL (ref 2.5–4.6)

## 2020-06-04 LAB — MAGNESIUM: Magnesium: 2.1 mg/dL (ref 1.7–2.4)

## 2020-06-04 MED ORDER — POTASSIUM CHLORIDE 10 MEQ/100ML IV SOLN
10.0000 meq | INTRAVENOUS | Status: AC
  Administered 2020-06-04 (×2): 10 meq via INTRAVENOUS
  Filled 2020-06-04 (×2): qty 100

## 2020-06-04 MED ORDER — FREE WATER
200.0000 mL | Status: DC
  Administered 2020-06-04 – 2020-06-26 (×127): 200 mL

## 2020-06-04 MED ORDER — PIVOT 1.5 CAL PO LIQD
1000.0000 mL | ORAL | Status: DC
  Administered 2020-06-04 – 2020-06-05 (×2): 1000 mL
  Filled 2020-06-04: qty 1000

## 2020-06-04 MED ORDER — METOCLOPRAMIDE HCL 5 MG/ML IJ SOLN
5.0000 mg | Freq: Four times a day (QID) | INTRAMUSCULAR | Status: DC
  Administered 2020-06-04 – 2020-06-08 (×16): 5 mg via INTRAVENOUS
  Filled 2020-06-04 (×16): qty 2
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
## 2020-06-04 NOTE — Progress Notes (Signed)
Inpatient Rehab Admissions Coordinator:    I met with Pt. And spoke with daughter over the phone to discuss potential CIR admission. They stated interest. Pt.'s daughter states she can provide some support at d/c and will talk to her sister to see if she can care for Pt. While she works.  Clemens Catholic, [place], [place] Admissions Coordinator  [phone] ([place]) [phone] (office)
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
## 2020-06-05 LAB — GLUCOSE, CAPILLARY
Glucose-Capillary: 117 mg/dL — ABNORMAL HIGH (ref 70–99)
Glucose-Capillary: 116 mg/dL — ABNORMAL HIGH (ref 70–99)
Glucose-Capillary: 99 mg/dL (ref 70–99)
Glucose-Capillary: 107 mg/dL — ABNORMAL HIGH (ref 70–99)
Glucose-Capillary: 88 mg/dL (ref 70–99)
Glucose-Capillary: 91 mg/dL (ref 70–99)

## 2020-06-05 MED ORDER — OXYCODONE HCL 5 MG/5ML PO SOLN
5.0000 mg | Freq: Four times a day (QID) | ORAL | Status: DC | PRN
  Administered 2020-06-06: 5 mg
  Administered 2020-06-08 – 2020-06-12 (×6): 10 mg
  Administered 2020-06-12: 5 mg
  Administered 2020-06-17: 10 mg
  Administered 2020-06-18: 5 mg
  Administered 2020-06-20: 10 mg
  Administered 2020-06-28: 5 mg
  Filled 2020-06-05 (×4): qty 10
  Filled 2020-06-05: qty 5
  Filled 2020-06-05 (×3): qty 10
  Filled 2020-06-05: qty 5
  Filled 2020-06-05: qty 10
  Filled 2020-06-05: qty 5
  Filled 2020-06-05 (×2): qty 10

## 2020-06-05 MED ORDER — PIVOT 1.5 CAL PO LIQD
1000.0000 mL | ORAL | Status: DC
  Administered 2020-06-05: 1000 mL
  Filled 2020-06-05: qty 1000

## 2020-06-05 MED ORDER — METHOCARBAMOL 1000 MG/10ML IJ SOLN
1000.0000 mg | Freq: Three times a day (TID) | INTRAVENOUS | Status: DC | PRN
  Filled 2020-06-05: qty 10

## 2020-06-05 MED ORDER — HYDROMORPHONE HCL 1 MG/ML IJ SOLN
0.5000 mg | INTRAMUSCULAR | Status: DC | PRN
  Administered 2020-06-14 – 2020-06-24 (×2): 0.5 mg via INTRAVENOUS
  Filled 2020-06-05 (×2): qty 1

## 2020-06-05 NOTE — Progress Notes (Signed)
0900:  Patient started to desat to 5s.  RN suction with tracheal kit and got thick secretions.  RT arrived and put patient back on ventilator.  Patient stable at this time.
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
## 2020-06-05 NOTE — Progress Notes (Signed)
Patient placed on 40% ATC without complications. Vitals stable at this time. RT will continue to monitor.

## 2020-06-05 NOTE — Progress Notes (Signed)
RT NOTE: RT called to room due to patient desating into the 60's. Upon arrival RT removed ATC and placed patient back on ventilator on full support. RT increased FiO2 to 100% and patient's sats quickly came back up into the 90's. RT returned FiO2 to 40% and patient's sats are stable at 94%. VSS. RT will continue to monitor.

## 2020-06-06 ENCOUNTER — Inpatient Hospital Stay (HOSPITAL_COMMUNITY): Payer: Medicare Other

## 2020-06-06 LAB — CBC
HCT: 32.8 % — ABNORMAL LOW (ref 39.0–52.0)
Hemoglobin: 10 g/dL — ABNORMAL LOW (ref 13.0–17.0)
MCH: 26.6 pg (ref 26.0–34.0)
MCHC: 30.5 g/dL (ref 30.0–36.0)
MCV: 87.2 fL (ref 80.0–100.0)
Platelets: 328 10*3/uL (ref 150–400)
RBC: 3.76 MIL/uL — ABNORMAL LOW (ref 4.22–5.81)
RDW: 16.3 % — ABNORMAL HIGH (ref 11.5–15.5)
WBC: 12.1 10*3/uL — ABNORMAL HIGH (ref 4.0–10.5)
nRBC: 0 % (ref 0.0–0.2)

## 2020-06-06 LAB — GLUCOSE, CAPILLARY
Glucose-Capillary: 115 mg/dL — ABNORMAL HIGH (ref 70–99)
Glucose-Capillary: 122 mg/dL — ABNORMAL HIGH (ref 70–99)
Glucose-Capillary: 123 mg/dL — ABNORMAL HIGH (ref 70–99)
Glucose-Capillary: 139 mg/dL — ABNORMAL HIGH (ref 70–99)
Glucose-Capillary: 93 mg/dL (ref 70–99)
Glucose-Capillary: 94 mg/dL (ref 70–99)

## 2020-06-06 LAB — BASIC METABOLIC PANEL
Anion gap: 11 (ref 5–15)
BUN: 39 mg/dL — ABNORMAL HIGH (ref 8–23)
CO2: 26 mmol/L (ref 22–32)
Calcium: 9 mg/dL (ref 8.9–10.3)
Chloride: 110 mmol/L (ref 98–111)
Creatinine, Ser: 1.94 mg/dL — ABNORMAL HIGH (ref 0.61–1.24)
GFR, Estimated: 35 mL/min — ABNORMAL LOW (ref >60)
Glucose, Bld: 116 mg/dL — ABNORMAL HIGH (ref 70–99)
Potassium: 2.9 mmol/L — ABNORMAL LOW (ref 3.5–5.1)
Sodium: 147 mmol/L — ABNORMAL HIGH (ref 135–145)

## 2020-06-06 LAB — MAGNESIUM: Magnesium: 2.2 mg/dL (ref 1.7–2.4)

## 2020-06-06 MED ORDER — POTASSIUM CHLORIDE 20 MEQ PO PACK
60.0000 meq | PACK | Freq: Once | ORAL | Status: AC
  Administered 2020-06-06: 60 meq
  Filled 2020-06-06: qty 3

## 2020-06-06 MED ORDER — GUAIFENESIN 100 MG/5ML PO SOLN
10.0000 mL | Freq: Four times a day (QID) | ORAL | Status: DC | PRN
  Administered 2020-06-06: 200 mg
  Filled 2020-06-06: qty 15

## 2020-06-06 MED ORDER — PIVOT 1.5 CAL PO LIQD
1000.0000 mL | ORAL | Status: DC
  Administered 2020-06-06 – 2020-06-07 (×2): 1000 mL

## 2020-06-06 MED ORDER — POTASSIUM CHLORIDE 20 MEQ PO PACK: 60.0000 meq | PACK | Freq: Once | ORAL | Status: DC
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
## 2020-06-06 NOTE — Progress Notes (Signed)
While performing oral care, a wire came loose from patient's jaw fixation. Contacted Dr. Isaias Cowman who asked me to call the OR and ask for 22 gauge wire and a needle driver to replace the wire. Have contacted OR and will get supplies.

## 2020-06-06 NOTE — Progress Notes (Signed)
OTO HNS PROGRESS NOTE  Notified by RN that left anterior wire had fallen off during oral care. On exam, both wires placed on left are missing. Patient still in Class I occlusion, with small amount of movement due to absent wires. Wires replaced on left, with patient in tight Class I occlusion. Can use dental wax as needed for oral irritation from wires. Patient to have MMF wire removal with replacement with elastics next week by Dr. Constance Holster.   Thank you for allowing me to participate in the care of this patient. Please do not hesitate to contact me with any questions or concerns.   Jason Coop, [facility] Cell: [phone]

## 2020-06-07 LAB — BASIC METABOLIC PANEL
Anion gap: 12 (ref 5–15)
BUN: 40 mg/dL — ABNORMAL HIGH (ref 8–23)
CO2: 24 mmol/L (ref 22–32)
Calcium: 8.7 mg/dL — ABNORMAL LOW (ref 8.9–10.3)
Chloride: 110 mmol/L (ref 98–111)
Creatinine, Ser: 1.67 mg/dL — ABNORMAL HIGH (ref 0.61–1.24)
GFR, Estimated: 42 mL/min — ABNORMAL LOW (ref >60)
Glucose, Bld: 155 mg/dL — ABNORMAL HIGH (ref 70–99)
Potassium: 3.1 mmol/L — ABNORMAL LOW (ref 3.5–5.1)
Sodium: 146 mmol/L — ABNORMAL HIGH (ref 135–145)

## 2020-06-07 LAB — CBC
HCT: 30.5 % — ABNORMAL LOW (ref 39.0–52.0)
Hemoglobin: 9.6 g/dL — ABNORMAL LOW (ref 13.0–17.0)
MCH: 27.2 pg (ref 26.0–34.0)
MCHC: 31.5 g/dL (ref 30.0–36.0)
MCV: 86.4 fL (ref 80.0–100.0)
Platelets: 313 10*3/uL (ref 150–400)
RBC: 3.53 MIL/uL — ABNORMAL LOW (ref 4.22–5.81)
RDW: 16.7 % — ABNORMAL HIGH (ref 11.5–15.5)
WBC: 11.6 10*3/uL — ABNORMAL HIGH (ref 4.0–10.5)
nRBC: 0 % (ref 0.0–0.2)

## 2020-06-07 LAB — GLUCOSE, CAPILLARY
Glucose-Capillary: 112 mg/dL — ABNORMAL HIGH (ref 70–99)
Glucose-Capillary: 113 mg/dL — ABNORMAL HIGH (ref 70–99)
Glucose-Capillary: 117 mg/dL — ABNORMAL HIGH (ref 70–99)
Glucose-Capillary: 122 mg/dL — ABNORMAL HIGH (ref 70–99)
Glucose-Capillary: 142 mg/dL — ABNORMAL HIGH (ref 70–99)
Glucose-Capillary: 86 mg/dL (ref 70–99)

## 2020-06-07 MED ORDER — POLYETHYLENE GLYCOL 3350 17 G PO PACK
17.0000 g | PACK | Freq: Every day | ORAL | Status: DC
  Administered 2020-06-07 – 2020-06-08 (×2): 17 g
  Filled 2020-06-07 (×2): qty 1

## 2020-06-07 MED ORDER — POTASSIUM CHLORIDE 20 MEQ PO PACK
20.0000 meq | PACK | ORAL | Status: AC
  Administered 2020-06-07 (×2): 20 meq
  Filled 2020-06-07 (×2): qty 1

## 2020-06-07 MED ORDER — POTASSIUM CHLORIDE 10 MEQ/100ML IV SOLN
10.0000 meq | INTRAVENOUS | Status: AC
  Administered 2020-06-07 (×4): 10 meq via INTRAVENOUS
  Filled 2020-06-07 (×3): qty 100

## 2020-06-07 MED ORDER — PIVOT 1.5 CAL PO LIQD
1000.0000 mL | ORAL | Status: DC
  Administered 2020-06-08: 1000 mL
  Filled 2020-06-07: qty 1000
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
## 2020-06-08 LAB — GLUCOSE, CAPILLARY
Glucose-Capillary: 111 mg/dL — ABNORMAL HIGH (ref 70–99)
Glucose-Capillary: 147 mg/dL — ABNORMAL HIGH (ref 70–99)
Glucose-Capillary: 140 mg/dL — ABNORMAL HIGH (ref 70–99)
Glucose-Capillary: 113 mg/dL — ABNORMAL HIGH (ref 70–99)
Glucose-Capillary: 127 mg/dL — ABNORMAL HIGH (ref 70–99)
Glucose-Capillary: 135 mg/dL — ABNORMAL HIGH (ref 70–99)

## 2020-06-08 LAB — BASIC METABOLIC PANEL
Anion gap: 9 (ref 5–15)
BUN: 37 mg/dL — ABNORMAL HIGH (ref 8–23)
CO2: 27 mmol/L (ref 22–32)
Calcium: 8.8 mg/dL — ABNORMAL LOW (ref 8.9–10.3)
Chloride: 113 mmol/L — ABNORMAL HIGH (ref 98–111)
Creatinine, Ser: 1.48 mg/dL — ABNORMAL HIGH (ref 0.61–1.24)
GFR, Estimated: 48 mL/min — ABNORMAL LOW (ref >60)
Glucose, Bld: 128 mg/dL — ABNORMAL HIGH (ref 70–99)
Potassium: 3.7 mmol/L (ref 3.5–5.1)
Sodium: 149 mmol/L — ABNORMAL HIGH (ref 135–145)

## 2020-06-08 LAB — CBC
HCT: 31.3 % — ABNORMAL LOW (ref 39.0–52.0)
Hemoglobin: 10 g/dL — ABNORMAL LOW (ref 13.0–17.0)
MCH: 27.8 pg (ref 26.0–34.0)
MCHC: 31.9 g/dL (ref 30.0–36.0)
MCV: 86.9 fL (ref 80.0–100.0)
Platelets: 335 10*3/uL (ref 150–400)
RBC: 3.6 MIL/uL — ABNORMAL LOW (ref 4.22–5.81)
RDW: 17.1 % — ABNORMAL HIGH (ref 11.5–15.5)
WBC: 11.6 10*3/uL — ABNORMAL HIGH (ref 4.0–10.5)
nRBC: 0 % (ref 0.0–0.2)

## 2020-06-08 MED ORDER — METOCLOPRAMIDE HCL 5 MG/ML IJ SOLN
5.0000 mg | Freq: Three times a day (TID) | INTRAMUSCULAR | Status: DC
  Administered 2020-06-08 – 2020-06-10 (×6): 5 mg via INTRAVENOUS
  Filled 2020-06-08 (×6): qty 2

## 2020-06-08 MED ORDER — CARVEDILOL 12.5 MG PO TABS
25.0000 mg | ORAL_TABLET | Freq: Two times a day (BID) | ORAL | Status: DC
  Administered 2020-06-08 – 2020-06-28 (×37): 25 mg
  Filled 2020-06-08 (×37): qty 2

## 2020-06-08 MED ORDER — GUAIFENESIN 100 MG/5ML PO SOLN
10.0000 mL | Freq: Four times a day (QID) | ORAL | Status: DC
  Administered 2020-06-08 – 2020-06-09 (×5): 200 mg
  Filled 2020-06-08 (×3): qty 30

## 2020-06-08 MED ORDER — METHOCARBAMOL 500 MG PO TABS
1000.0000 mg | ORAL_TABLET | Freq: Three times a day (TID) | ORAL | Status: DC
  Administered 2020-06-08 – 2020-06-23 (×44): 1000 mg
  Filled 2020-06-08 (×44): qty 2

## 2020-06-08 MED ORDER — GERHARDT'S BUTT CREAM
TOPICAL_CREAM | Freq: Two times a day (BID) | CUTANEOUS | Status: DC
  Administered 2020-06-08 – 2020-06-26 (×12): 1 via TOPICAL
  Filled 2020-06-08 (×2): qty 1

## 2020-06-08 MED ORDER — PIVOT 1.5 CAL PO LIQD
1000.0000 mL | ORAL | Status: DC
  Administered 2020-06-08 – 2020-06-28 (×22): 1000 mL
  Filled 2020-06-08 (×15): qty 1000

## 2020-06-08 MED ORDER — CARVEDILOL 12.5 MG PO TABS
12.5000 mg | ORAL_TABLET | Freq: Once | ORAL | Status: AC
  Administered 2020-06-08: 12.5 mg via ORAL
  Filled 2020-06-08: qty 1

## 2020-06-08 MED ORDER — METOPROLOL TARTRATE 5 MG/5ML IV SOLN
5.0000 mg | Freq: Four times a day (QID) | INTRAVENOUS | Status: DC | PRN
  Administered 2020-06-08: 5 mg via INTRAVENOUS
  Filled 2020-06-08: qty 5
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
## 2020-06-08 NOTE — Progress Notes (Signed)
Inpatient Rehabilitation-Admissions Coordinator   Spoke with pt's daughter Lewie Chamber again this AM. She confirmed DC support and reaffirmed preference for CIR. AC will continue to follow along for medical readiness. Will also need to begin insurance auth once medially appropriate.  Raechel Ache, OTR/L  Rehab Admissions Coordinator  [phone] 06/08/2020 12:49 PM
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
## 2020-06-08 NOTE — Progress Notes (Signed)
Arrived to patients room while on ATC.  Patient SP02 is in the low 80's and after suction and lavagingthe patient no improvement shown.  Patient is not as alert as he was earlier the ATC has tired him out today patient has given great effort but requiring rest at this time per my assessment.   Patient placed back on rest with full ventilator support for the night.  RT will assess in the am and place on ATC and continuing the wean process as tolerated.
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
## 2020-06-09 ENCOUNTER — Other Ambulatory Visit: Payer: Self-pay

## 2020-06-09 LAB — GLUCOSE, CAPILLARY
Glucose-Capillary: 114 mg/dL — ABNORMAL HIGH (ref 70–99)
Glucose-Capillary: 118 mg/dL — ABNORMAL HIGH (ref 70–99)
Glucose-Capillary: 119 mg/dL — ABNORMAL HIGH (ref 70–99)
Glucose-Capillary: 138 mg/dL — ABNORMAL HIGH (ref 70–99)
Glucose-Capillary: 139 mg/dL — ABNORMAL HIGH (ref 70–99)
Glucose-Capillary: 144 mg/dL — ABNORMAL HIGH (ref 70–99)

## 2020-06-09 MED ORDER — GLYCOPYRROLATE 0.2 MG/ML IJ SOLN
0.2000 mg | Freq: Three times a day (TID) | INTRAMUSCULAR | Status: DC
  Administered 2020-06-09 – 2020-06-15 (×16): 0.2 mg via INTRAVENOUS
  Filled 2020-06-09 (×17): qty 1

## 2020-06-09 MED ORDER — GUAIFENESIN 100 MG/5ML PO SOLN
10.0000 mL | ORAL | Status: DC
  Administered 2020-06-09 – 2020-06-14 (×31): 200 mg
  Administered 2020-06-14: 10 mL
  Administered 2020-06-15 – 2020-06-16 (×9): 200 mg
  Filled 2020-06-09 (×40): qty 15

## 2020-06-09 MED ORDER — POLYETHYLENE GLYCOL 3350 17 G PO PACK
17.0000 g | PACK | Freq: Every day | ORAL | Status: DC | PRN
  Administered 2020-06-13 – 2020-06-15 (×2): 17 g
  Filled 2020-06-09 (×2): qty 1
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
## 2020-06-09 NOTE — Progress Notes (Signed)
Pt was placed on trach collar 10L/40% and is tolerating well at this time. RT will monitor.

## 2020-06-09 NOTE — Progress Notes (Addendum)
Pt desaturated to the low 60s even with Rn suctioning out trach. No change in saturations with a good suction, so RT placed pt back on vent PS/ CPAP 10/+5/40%. Saturations are now appropriate in mid 90s on ventilator. RT will monitor.

## 2020-06-09 NOTE — Progress Notes (Signed)
Inpatient Rehab Admissions Coordinator:   I met with pt. And his daughter Lewie Chamber in Pt.'s room for ongoing discussion regarding potential CIR admit. They remain interested and plan for Pt. To discharge home with [facility]. Pt. Is alert but on vent today. He is not medically ready for CIR, so I will hold on requesting preauthorization from Pt.'s insurance. [facility] team will continue to follow for potential admit pending medical readiness, bed availability, and insurance authorization.   Clemens Catholic, [place], [place] Admissions Coordinator  [phone] ([place]) [phone] (office)

## 2020-06-10 LAB — GLUCOSE, CAPILLARY
Glucose-Capillary: 118 mg/dL — ABNORMAL HIGH (ref 70–99)
Glucose-Capillary: 125 mg/dL — ABNORMAL HIGH (ref 70–99)
Glucose-Capillary: 138 mg/dL — ABNORMAL HIGH (ref 70–99)
Glucose-Capillary: 140 mg/dL — ABNORMAL HIGH (ref 70–99)
Glucose-Capillary: 144 mg/dL — ABNORMAL HIGH (ref 70–99)
Glucose-Capillary: 156 mg/dL — ABNORMAL HIGH (ref 70–99)

## 2020-06-10 MED ORDER — QUETIAPINE FUMARATE 25 MG PO TABS
50.0000 mg | ORAL_TABLET | Freq: Every day | ORAL | Status: DC
  Administered 2020-06-10: 50 mg via ORAL
  Filled 2020-06-10: qty 2

## 2020-06-10 NOTE — Progress Notes (Signed)
Placed pt on PRVC for the night. Pts sats were mid 80s on PS 5/5

## 2020-06-10 NOTE — Plan of Care (Signed)
?  Problem: Role Relationship: ?Goal: Method of communication will improve ?Outcome: Progressing ?  ?
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
## 2020-06-11 LAB — CBC
HCT: 32.6 % — ABNORMAL LOW (ref 39.0–52.0)
Hemoglobin: 9.6 g/dL — ABNORMAL LOW (ref 13.0–17.0)
MCH: 26.9 pg (ref 26.0–34.0)
MCHC: 29.4 g/dL — ABNORMAL LOW (ref 30.0–36.0)
MCV: 91.3 fL (ref 80.0–100.0)
Platelets: 265 10*3/uL (ref 150–400)
RBC: 3.57 MIL/uL — ABNORMAL LOW (ref 4.22–5.81)
RDW: 18.5 % — ABNORMAL HIGH (ref 11.5–15.5)
WBC: 16.4 10*3/uL — ABNORMAL HIGH (ref 4.0–10.5)
nRBC: 0 % (ref 0.0–0.2)

## 2020-06-11 LAB — BASIC METABOLIC PANEL
Anion gap: 9 (ref 5–15)
BUN: 55 mg/dL — ABNORMAL HIGH (ref 8–23)
CO2: 28 mmol/L (ref 22–32)
Calcium: 8.5 mg/dL — ABNORMAL LOW (ref 8.9–10.3)
Chloride: 111 mmol/L (ref 98–111)
Creatinine, Ser: 1.55 mg/dL — ABNORMAL HIGH (ref 0.61–1.24)
GFR, Estimated: 46 mL/min — ABNORMAL LOW (ref >60)
Glucose, Bld: 140 mg/dL — ABNORMAL HIGH (ref 70–99)
Potassium: 3.9 mmol/L (ref 3.5–5.1)
Sodium: 148 mmol/L — ABNORMAL HIGH (ref 135–145)

## 2020-06-11 LAB — GLUCOSE, CAPILLARY
Glucose-Capillary: 111 mg/dL — ABNORMAL HIGH (ref 70–99)
Glucose-Capillary: 134 mg/dL — ABNORMAL HIGH (ref 70–99)
Glucose-Capillary: 125 mg/dL — ABNORMAL HIGH (ref 70–99)
Glucose-Capillary: 119 mg/dL — ABNORMAL HIGH (ref 70–99)
Glucose-Capillary: 121 mg/dL — ABNORMAL HIGH (ref 70–99)
Glucose-Capillary: 94 mg/dL (ref 70–99)

## 2020-06-11 MED ORDER — QUETIAPINE FUMARATE 25 MG PO TABS
50.0000 mg | ORAL_TABLET | Freq: Every day | ORAL | Status: DC
  Administered 2020-06-11 – 2020-06-27 (×17): 50 mg
  Filled 2020-06-11 (×17): qty 2
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
## 2020-06-12 LAB — GLUCOSE, CAPILLARY
Glucose-Capillary: 113 mg/dL — ABNORMAL HIGH (ref 70–99)
Glucose-Capillary: 130 mg/dL — ABNORMAL HIGH (ref 70–99)
Glucose-Capillary: 131 mg/dL — ABNORMAL HIGH (ref 70–99)
Glucose-Capillary: 135 mg/dL — ABNORMAL HIGH (ref 70–99)
Glucose-Capillary: 138 mg/dL — ABNORMAL HIGH (ref 70–99)
Glucose-Capillary: 94 mg/dL (ref 70–99)

## 2020-06-12 MED ORDER — SODIUM CHLORIDE 0.9 % IV BOLUS
500.0000 mL | Freq: Once | INTRAVENOUS | Status: AC
  Administered 2020-06-12: 500 mL via INTRAVENOUS

## 2020-06-12 NOTE — Plan of Care (Addendum)
  Problem: Clinical Measurements: Goal: Cardiovascular complication will be avoided Outcome: Progressing     Patient hypotensive BP 83/53 MAP 63 HR 88 , asymptomatic, easily arousable and following commands. Dr. Dema Severin aware, bolus ordered. Evening Coreg held. Will monitor response.   Vitals:   06/12/20 1600 06/12/20 1638 06/12/20 1700 06/12/20 1730  BP: (!) 82/48 (!) 90/48 99/66 (!) 83/53  Pulse: 70 (!) 157 72 88  Temp: (!) 96.8 F (36 C)     Resp: 14 14 14 14   Height:      Weight:      SpO2: 96% 97% 98% 96%  TempSrc: Axillary     BMI (Calculated):
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
## 2020-06-12 NOTE — Progress Notes (Signed)
RN of pt called RT stating she had placed pt back to full ventilatory support due to desaturation.

## 2020-06-13 LAB — GLUCOSE, CAPILLARY
Glucose-Capillary: 98 mg/dL (ref 70–99)
Glucose-Capillary: 133 mg/dL — ABNORMAL HIGH (ref 70–99)
Glucose-Capillary: 115 mg/dL — ABNORMAL HIGH (ref 70–99)
Glucose-Capillary: 122 mg/dL — ABNORMAL HIGH (ref 70–99)
Glucose-Capillary: 113 mg/dL — ABNORMAL HIGH (ref 70–99)
Glucose-Capillary: 94 mg/dL (ref 70–99)
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
## 2020-06-13 NOTE — Progress Notes (Signed)
RT attempted to place pt on ATC 8L 35%. Pt appeared to have trouble breathing. RT asked pt, and he shook his head yes, it was hard to breathe. RT attempted to coach pt through process, unfortunately pt requesting to be back on ventilator. RT suggested to the pt to attempt tomorrow morning. RT will continue to monitor pt.

## 2020-06-14 ENCOUNTER — Inpatient Hospital Stay (HOSPITAL_COMMUNITY): Payer: Medicare Other

## 2020-06-14 DIAGNOSIS — I462 Cardiac arrest due to underlying cardiac condition: Secondary | ICD-10-CM | POA: Diagnosis not present

## 2020-06-14 DIAGNOSIS — R578 Other shock: Secondary | ICD-10-CM | POA: Diagnosis not present

## 2020-06-14 DIAGNOSIS — S02601A Fracture of unspecified part of body of right mandible, initial encounter for closed fracture: Secondary | ICD-10-CM | POA: Diagnosis not present

## 2020-06-14 DIAGNOSIS — J9601 Acute respiratory failure with hypoxia: Secondary | ICD-10-CM | POA: Diagnosis not present

## 2020-06-14 LAB — POCT I-STAT 7, (LYTES, BLD GAS, ICA,H+H)
Acid-Base Excess: 5 mmol/L — ABNORMAL HIGH (ref 0.0–2.0)
Bicarbonate: 32.2 mmol/L — ABNORMAL HIGH (ref 20.0–28.0)
Calcium, Ion: 1.25 mmol/L (ref 1.15–1.40)
HCT: 26 % — ABNORMAL LOW (ref 39.0–52.0)
Hemoglobin: 8.8 g/dL — ABNORMAL LOW (ref 13.0–17.0)
O2 Saturation: 99 %
Patient temperature: 97.7
Potassium: 3.8 mmol/L (ref 3.5–5.1)
Sodium: 150 mmol/L — ABNORMAL HIGH (ref 135–145)
TCO2: 34 mmol/L — ABNORMAL HIGH (ref 22–32)
pCO2 arterial: 62.7 mmHg — ABNORMAL HIGH (ref 32.0–48.0)
pH, Arterial: 7.317 — ABNORMAL LOW (ref 7.350–7.450)
pO2, Arterial: 170 mmHg — ABNORMAL HIGH (ref 83.0–108.0)

## 2020-06-14 LAB — BASIC METABOLIC PANEL
Anion gap: 9 (ref 5–15)
BUN: 40 mg/dL — ABNORMAL HIGH (ref 8–23)
CO2: 27 mmol/L (ref 22–32)
Calcium: 8.6 mg/dL — ABNORMAL LOW (ref 8.9–10.3)
Chloride: 110 mmol/L (ref 98–111)
Creatinine, Ser: 1.16 mg/dL (ref 0.61–1.24)
GFR, Estimated: >60 mL/min (ref >60)
Glucose, Bld: 128 mg/dL — ABNORMAL HIGH (ref 70–99)
Potassium: 3.5 mmol/L (ref 3.5–5.1)
Sodium: 146 mmol/L — ABNORMAL HIGH (ref 135–145)

## 2020-06-14 LAB — CBC WITH DIFFERENTIAL/PLATELET
Abs Immature Granulocytes: 0.03 10*3/uL (ref 0.00–0.07)
Abs Immature Granulocytes: 0.19 10*3/uL — ABNORMAL HIGH (ref 0.00–0.07)
Basophils Absolute: 0 10*3/uL (ref 0.0–0.1)
Basophils Absolute: 0 10*3/uL (ref 0.0–0.1)
Basophils Relative: 0 %
Basophils Relative: 0 %
Eosinophils Absolute: 0.2 10*3/uL (ref 0.0–0.5)
Eosinophils Absolute: 0.3 10*3/uL (ref 0.0–0.5)
Eosinophils Relative: 2 %
Eosinophils Relative: 2 %
HCT: 31.3 % — ABNORMAL LOW (ref 39.0–52.0)
HCT: 33.7 % — ABNORMAL LOW (ref 39.0–52.0)
Hemoglobin: 9.1 g/dL — ABNORMAL LOW (ref 13.0–17.0)
Hemoglobin: 10 g/dL — ABNORMAL LOW (ref 13.0–17.0)
Immature Granulocytes: 0 %
Immature Granulocytes: 2 %
Lymphocytes Relative: 17 %
Lymphocytes Relative: 20 %
Lymphs Abs: 1.4 10*3/uL (ref 0.7–4.0)
Lymphs Abs: 2.4 10*3/uL (ref 0.7–4.0)
MCH: 26.5 pg (ref 26.0–34.0)
MCH: 27.2 pg (ref 26.0–34.0)
MCHC: 29.1 g/dL — ABNORMAL LOW (ref 30.0–36.0)
MCHC: 29.7 g/dL — ABNORMAL LOW (ref 30.0–36.0)
MCV: 91 fL (ref 80.0–100.0)
MCV: 91.8 fL (ref 80.0–100.0)
Monocytes Absolute: 0.9 10*3/uL (ref 0.1–1.0)
Monocytes Absolute: 0.8 10*3/uL (ref 0.1–1.0)
Monocytes Relative: 10 %
Monocytes Relative: 7 %
Neutro Abs: 6 10*3/uL (ref 1.7–7.7)
Neutro Abs: 8.5 10*3/uL — ABNORMAL HIGH (ref 1.7–7.7)
Neutrophils Relative %: 71 %
Neutrophils Relative %: 69 %
Platelets: 232 10*3/uL (ref 150–400)
Platelets: 296 10*3/uL (ref 150–400)
RBC: 3.44 MIL/uL — ABNORMAL LOW (ref 4.22–5.81)
RBC: 3.67 MIL/uL — ABNORMAL LOW (ref 4.22–5.81)
RDW: 18.6 % — ABNORMAL HIGH (ref 11.5–15.5)
RDW: 18.5 % — ABNORMAL HIGH (ref 11.5–15.5)
WBC: 8.5 10*3/uL (ref 4.0–10.5)
WBC: 12.2 10*3/uL — ABNORMAL HIGH (ref 4.0–10.5)
nRBC: 0 % (ref 0.0–0.2)
nRBC: 0.2 % (ref 0.0–0.2)

## 2020-06-14 LAB — COMPREHENSIVE METABOLIC PANEL
ALT: 76 U/L — ABNORMAL HIGH (ref 0–44)
AST: 51 U/L — ABNORMAL HIGH (ref 15–41)
Albumin: 2.5 g/dL — ABNORMAL LOW (ref 3.5–5.0)
Alkaline Phosphatase: 113 U/L (ref 38–126)
Anion gap: 9 (ref 5–15)
BUN: 43 mg/dL — ABNORMAL HIGH (ref 8–23)
CO2: 29 mmol/L (ref 22–32)
Calcium: 8.3 mg/dL — ABNORMAL LOW (ref 8.9–10.3)
Chloride: 108 mmol/L (ref 98–111)
Creatinine, Ser: 1.45 mg/dL — ABNORMAL HIGH (ref 0.61–1.24)
GFR, Estimated: 49 mL/min — ABNORMAL LOW (ref >60)
Glucose, Bld: 216 mg/dL — ABNORMAL HIGH (ref 70–99)
Potassium: 4.2 mmol/L (ref 3.5–5.1)
Sodium: 146 mmol/L — ABNORMAL HIGH (ref 135–145)
Total Bilirubin: 1.4 mg/dL — ABNORMAL HIGH (ref 0.3–1.2)
Total Protein: 6.9 g/dL (ref 6.5–8.1)

## 2020-06-14 LAB — TROPONIN I (HIGH SENSITIVITY)
Troponin I (High Sensitivity): 49 ng/L — ABNORMAL HIGH (ref <18)
Troponin I (High Sensitivity): 56 ng/L — ABNORMAL HIGH (ref <18)
Troponin I (High Sensitivity): 66 ng/L — ABNORMAL HIGH (ref <18)

## 2020-06-14 LAB — GLUCOSE, CAPILLARY
Glucose-Capillary: 110 mg/dL — ABNORMAL HIGH (ref 70–99)
Glucose-Capillary: 118 mg/dL — ABNORMAL HIGH (ref 70–99)
Glucose-Capillary: 120 mg/dL — ABNORMAL HIGH (ref 70–99)
Glucose-Capillary: 129 mg/dL — ABNORMAL HIGH (ref 70–99)
Glucose-Capillary: 186 mg/dL — ABNORMAL HIGH (ref 70–99)
Glucose-Capillary: 86 mg/dL (ref 70–99)

## 2020-06-14 LAB — LACTIC ACID, PLASMA
Lactic Acid, Venous: 2.1 mmol/L (ref 0.5–1.9)
Lactic Acid, Venous: 1.3 mmol/L (ref 0.5–1.9)

## 2020-06-14 MED ORDER — NOREPINEPHRINE 4 MG/250ML-% IV SOLN: 0.0000 ug/min | INTRAVENOUS | Status: DC

## 2020-06-14 MED ORDER — LORAZEPAM 2 MG/ML IJ SOLN
1.0000 mg | INTRAMUSCULAR | Status: DC | PRN
  Administered 2020-06-14: 1 mg via INTRAVENOUS
  Administered 2020-06-20: 2 mg via INTRAVENOUS
  Administered 2020-06-24 – 2020-06-28 (×6): 1 mg via INTRAVENOUS
  Filled 2020-06-14 (×8): qty 1

## 2020-06-14 MED ORDER — SODIUM CHLORIDE 0.9 % IV BOLUS
500.0000 mL | Freq: Once | INTRAVENOUS | Status: AC
  Administered 2020-06-14: 500 mL via INTRAVENOUS

## 2020-06-14 MED ORDER — SODIUM CHLORIDE 0.9 % IV SOLN: INTRAVENOUS | Status: DC

## 2020-06-14 MED ORDER — ATROPINE SULFATE 1 MG/ML IJ SOLN
INTRAMUSCULAR | Status: AC
  Filled 2020-06-14: qty 1

## 2020-06-14 MED ORDER — BISACODYL 10 MG RE SUPP: 10.0000 mg | Freq: Every day | RECTAL | Status: DC | PRN

## 2020-06-14 MED ORDER — LORAZEPAM 2 MG/ML IJ SOLN
INTRAMUSCULAR | Status: AC
  Filled 2020-06-14: qty 1
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
## 2020-06-14 NOTE — Progress Notes (Signed)
Responded to CODE BLUE. Patient reportedly was getting up with therapies and became diaphoretic and started to become bradycardic with hypoxia. PEA noted on monitor. Patient positioned back in bed and CODE BLUE called. 11 minutes CPR with 3 rounds of epinephrine given. Bicarb gtt started. ROSC achieved. Placed back on the vent, tracheostomy was already in place. Central line placed - see separate note. Labs sent and CXR pending. ABG ordered and respiratory therapy to place A-line. Family updated briefly.   [name], [facility] 06/14/2020, 3:22 PM Please see Amion for pager number during day hours 7:00am-4:30pm

## 2020-06-14 NOTE — Progress Notes (Signed)
Patient ID: Daniel [name], male   DOB: June 12, 1940, 80 y.o.   MRN: [id number] Events of earlier discussed with my partners. He is awake and F/C well on the vent. SBP 120.HR 60-70s.Levo down to 2 - wean off as able. Will hold Coreg. Consider lasix in AM. I spoke with his daughter at length at the bedside.  Patient examined and I agree with the assessment and plan  Georganna Skeans, MD, MPH, FACS Please use [email or web address] to contact on call provider 06/14/2020 8:25 PM

## 2020-06-14 NOTE — Progress Notes (Signed)
   06/14/20 1439  Clinical Encounter Type  Visited With Patient not available;Family  Visit Type Code  Referral From Nurse  Consult/Referral To Chaplain  Stress Factors  Family Stress Factors Health changes   Chaplain responded to Code Blue. Chaplain engaged in active listening and emotional support when Pt's daughter, Daniel [name], arrived. Daniel [name] was on the phone with her son when chaplain left. Chaplain remains available.    This note was prepared by Chaplain Resident, Dante Gang, MDiv. Chaplain remains available as needed through the on-call pager: [phone].
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
## 2020-06-14 NOTE — Procedures (Signed)
Central Venous Catheter Insertion Procedure Note  Daniel [name]  [id number]  November 12, 1940  Date:06/14/20  Time:3:16 PM   Provider Performing:Kelly R Wynetta Emery   Procedure: Insertion of Non-tunneled Central Venous Catheter(36556) without US guidance  Indication(s) Medication administration  Consent Unable to obtain consent due to emergent nature of procedure.  Anesthesia None   Timeout Verified patient identification, verified procedure, site/side was marked, verified correct patient position, special equipment/implants available, medications/allergies/relevant history reviewed, required imaging and test results available.  Sterile Technique Maximal sterile technique including full sterile barrier drape, hand hygiene, sterile gown, sterile gloves, mask, hair covering, sterile ultrasound probe cover (if used).  Procedure Description Area of catheter insertion was cleaned with chlorhexidine and draped in sterile fashion.  Without real-time ultrasound guidance a central venous catheter was placed into the right femoral vein. Nonpulsatile blood flow and easy flushing noted in all ports.  The catheter was sutured in place and sterile dressing applied.  Complications/Tolerance None; patient tolerated the procedure well. Chest X-ray is ordered to verify placement for internal jugular or subclavian cannulation.   Chest x-ray is not ordered for femoral cannulation.  EBL Minimal  Specimen(s) None  [name], [facility] 06/14/2020, 3:17 PM Please see Amion for pager number during day hours 7:00am-4:30pm

## 2020-06-15 ENCOUNTER — Inpatient Hospital Stay (HOSPITAL_COMMUNITY): Payer: Medicare Other

## 2020-06-15 LAB — POCT I-STAT 7, (LYTES, BLD GAS, ICA,H+H)
Acid-Base Excess: 5 mmol/L — ABNORMAL HIGH (ref 0.0–2.0)
Bicarbonate: 30 mmol/L — ABNORMAL HIGH (ref 20.0–28.0)
Calcium, Ion: 1.25 mmol/L (ref 1.15–1.40)
HCT: 29 % — ABNORMAL LOW (ref 39.0–52.0)
Hemoglobin: 9.9 g/dL — ABNORMAL LOW (ref 13.0–17.0)
O2 Saturation: 83 %
Patient temperature: 97.8
Potassium: 3.6 mmol/L (ref 3.5–5.1)
Sodium: 151 mmol/L — ABNORMAL HIGH (ref 135–145)
TCO2: 31 mmol/L (ref 22–32)
pCO2 arterial: 43.3 mmHg (ref 32.0–48.0)
pH, Arterial: 7.446 (ref 7.350–7.450)
pO2, Arterial: 44 mmHg — ABNORMAL LOW (ref 83.0–108.0)

## 2020-06-15 LAB — GLUCOSE, CAPILLARY
Glucose-Capillary: 75 mg/dL (ref 70–99)
Glucose-Capillary: 89 mg/dL (ref 70–99)
Glucose-Capillary: 98 mg/dL (ref 70–99)
Glucose-Capillary: 128 mg/dL — ABNORMAL HIGH (ref 70–99)
Glucose-Capillary: 125 mg/dL — ABNORMAL HIGH (ref 70–99)
Glucose-Capillary: 151 mg/dL — ABNORMAL HIGH (ref 70–99)

## 2020-06-15 LAB — BASIC METABOLIC PANEL
Anion gap: 9 (ref 5–15)
BUN: 42 mg/dL — ABNORMAL HIGH (ref 8–23)
CO2: 29 mmol/L (ref 22–32)
Calcium: 8.9 mg/dL (ref 8.9–10.3)
Chloride: 111 mmol/L (ref 98–111)
Creatinine, Ser: 1.38 mg/dL — ABNORMAL HIGH (ref 0.61–1.24)
GFR, Estimated: 52 mL/min — ABNORMAL LOW (ref >60)
Glucose, Bld: 109 mg/dL — ABNORMAL HIGH (ref 70–99)
Potassium: 3.9 mmol/L (ref 3.5–5.1)
Sodium: 149 mmol/L — ABNORMAL HIGH (ref 135–145)

## 2020-06-15 MED ORDER — ALBUTEROL SULFATE (2.5 MG/3ML) 0.083% IN NEBU
INHALATION_SOLUTION | RESPIRATORY_TRACT | Status: AC
  Filled 2020-06-15: qty 3

## 2020-06-15 MED ORDER — ALBUTEROL SULFATE (2.5 MG/3ML) 0.083% IN NEBU
2.5000 mg | INHALATION_SOLUTION | Freq: Four times a day (QID) | RESPIRATORY_TRACT | Status: DC | PRN
  Administered 2020-06-15 – 2020-06-25 (×5): 2.5 mg via RESPIRATORY_TRACT
  Filled 2020-06-15 (×4): qty 3

## 2020-06-15 MED ORDER — FUROSEMIDE 10 MG/ML IJ SOLN
20.0000 mg | Freq: Once | INTRAMUSCULAR | Status: AC
  Administered 2020-06-15: 20 mg via INTRAVENOUS
  Filled 2020-06-15: qty 2
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
## 2020-06-15 NOTE — Progress Notes (Signed)
Lavaged and suction patient.  Patient has thick mucus plugs and requires frequent lavaging.
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
## 2020-06-16 ENCOUNTER — Inpatient Hospital Stay (HOSPITAL_COMMUNITY): Payer: Medicare Other

## 2020-06-16 HISTORY — PX: IR THORACENTESIS ASP PLEURAL SPACE W/IMG GUIDE: IMG5380

## 2020-06-16 LAB — GLUCOSE, CAPILLARY
Glucose-Capillary: 108 mg/dL — ABNORMAL HIGH (ref 70–99)
Glucose-Capillary: 122 mg/dL — ABNORMAL HIGH (ref 70–99)
Glucose-Capillary: 124 mg/dL — ABNORMAL HIGH (ref 70–99)
Glucose-Capillary: 126 mg/dL — ABNORMAL HIGH (ref 70–99)
Glucose-Capillary: 131 mg/dL — ABNORMAL HIGH (ref 70–99)
Glucose-Capillary: 138 mg/dL — ABNORMAL HIGH (ref 70–99)

## 2020-06-16 LAB — BASIC METABOLIC PANEL
Anion gap: 9 (ref 5–15)
Anion gap: 11 (ref 5–15)
BUN: 46 mg/dL — ABNORMAL HIGH (ref 8–23)
BUN: 45 mg/dL — ABNORMAL HIGH (ref 8–23)
CO2: 29 mmol/L (ref 22–32)
CO2: 30 mmol/L (ref 22–32)
Calcium: 8.5 mg/dL — ABNORMAL LOW (ref 8.9–10.3)
Calcium: 8.7 mg/dL — ABNORMAL LOW (ref 8.9–10.3)
Chloride: 110 mmol/L (ref 98–111)
Chloride: 110 mmol/L (ref 98–111)
Creatinine, Ser: 1.57 mg/dL — ABNORMAL HIGH (ref 0.61–1.24)
Creatinine, Ser: 1.55 mg/dL — ABNORMAL HIGH (ref 0.61–1.24)
GFR, Estimated: 45 mL/min — ABNORMAL LOW (ref >60)
GFR, Estimated: 45 mL/min — ABNORMAL LOW (ref >60)
Glucose, Bld: 156 mg/dL — ABNORMAL HIGH (ref 70–99)
Glucose, Bld: 136 mg/dL — ABNORMAL HIGH (ref 70–99)
Potassium: 3.6 mmol/L (ref 3.5–5.1)
Potassium: 3.5 mmol/L (ref 3.5–5.1)
Sodium: 148 mmol/L — ABNORMAL HIGH (ref 135–145)
Sodium: 151 mmol/L — ABNORMAL HIGH (ref 135–145)

## 2020-06-16 LAB — CBC
HCT: 32 % — ABNORMAL LOW (ref 39.0–52.0)
Hemoglobin: 9.1 g/dL — ABNORMAL LOW (ref 13.0–17.0)
MCH: 26.2 pg (ref 26.0–34.0)
MCHC: 28.4 g/dL — ABNORMAL LOW (ref 30.0–36.0)
MCV: 92.2 fL (ref 80.0–100.0)
Platelets: 216 10*3/uL (ref 150–400)
RBC: 3.47 MIL/uL — ABNORMAL LOW (ref 4.22–5.81)
RDW: 18.4 % — ABNORMAL HIGH (ref 11.5–15.5)
WBC: 10.5 10*3/uL (ref 4.0–10.5)
nRBC: 0 % (ref 0.0–0.2)

## 2020-06-16 MED ORDER — GUAIFENESIN 100 MG/5ML PO SOLN
15.0000 mL | ORAL | Status: DC
  Administered 2020-06-16 – 2020-06-28 (×73): 300 mg
  Filled 2020-06-16 (×43): qty 15
  Filled 2020-06-16: qty 30
  Filled 2020-06-16 (×27): qty 15

## 2020-06-16 MED ORDER — METOLAZONE 5 MG PO TABS
10.0000 mg | ORAL_TABLET | Freq: Once | ORAL | Status: AC
  Administered 2020-06-16: 10 mg
  Filled 2020-06-16: qty 2

## 2020-06-16 MED ORDER — LIDOCAINE HCL (PF) 1 % IJ SOLN
INTRAMUSCULAR | Status: DC | PRN
  Administered 2020-06-16: 10 mL

## 2020-06-16 MED ORDER — LIDOCAINE HCL 1 % IJ SOLN
INTRAMUSCULAR | Status: AC
  Filled 2020-06-16: qty 20

## 2020-06-16 MED ORDER — FUROSEMIDE 10 MG/ML IJ SOLN
20.0000 mg | Freq: Once | INTRAMUSCULAR | Status: AC
  Administered 2020-06-16: 20 mg via INTRAVENOUS
  Filled 2020-06-16: qty 2
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
## 2020-06-16 NOTE — Progress Notes (Signed)
ENT follow-up  3 weeks postop.  Maxillomandibular fixation wires removed.  Will remain with arch bars for 3 more weeks and then we will schedule removal of those either as an inpatient or as an outpatient.  If his clinical situation allows he may start on a soft diet.
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
## 2020-06-16 NOTE — Progress Notes (Signed)
Pt transported to IR via ventilator with no complications noted. Pt returned to 4N26. Dorene Sorrow RRT aware.

## 2020-06-16 NOTE — Procedures (Signed)
PROCEDURE SUMMARY:  Successful US guided left diagnostic and therapeutic thoracentesis. Yielded 650 mL of yellow fluid. Pt tolerated procedure well. No immediate complications.  Specimen was sent for labs. CXR ordered.  EBL < 5 mL  Docia Barrier PA-C 06/16/2020 12:01 PM

## 2020-06-17 ENCOUNTER — Inpatient Hospital Stay (HOSPITAL_COMMUNITY): Payer: Medicare Other

## 2020-06-17 LAB — BASIC METABOLIC PANEL
Anion gap: 11 (ref 5–15)
BUN: 45 mg/dL — ABNORMAL HIGH (ref 8–23)
CO2: 29 mmol/L (ref 22–32)
Calcium: 8.7 mg/dL — ABNORMAL LOW (ref 8.9–10.3)
Chloride: 109 mmol/L (ref 98–111)
Creatinine, Ser: 1.43 mg/dL — ABNORMAL HIGH (ref 0.61–1.24)
GFR, Estimated: 50 mL/min — ABNORMAL LOW (ref >60)
Glucose, Bld: 146 mg/dL — ABNORMAL HIGH (ref 70–99)
Potassium: 3 mmol/L — ABNORMAL LOW (ref 3.5–5.1)
Sodium: 149 mmol/L — ABNORMAL HIGH (ref 135–145)

## 2020-06-17 LAB — GLUCOSE, CAPILLARY
Glucose-Capillary: 138 mg/dL — ABNORMAL HIGH (ref 70–99)
Glucose-Capillary: 161 mg/dL — ABNORMAL HIGH (ref 70–99)
Glucose-Capillary: 128 mg/dL — ABNORMAL HIGH (ref 70–99)
Glucose-Capillary: 133 mg/dL — ABNORMAL HIGH (ref 70–99)
Glucose-Capillary: 89 mg/dL (ref 70–99)
Glucose-Capillary: 100 mg/dL — ABNORMAL HIGH (ref 70–99)

## 2020-06-17 LAB — CBC
HCT: 30.8 % — ABNORMAL LOW (ref 39.0–52.0)
Hemoglobin: 9.6 g/dL — ABNORMAL LOW (ref 13.0–17.0)
MCH: 27.4 pg (ref 26.0–34.0)
MCHC: 31.2 g/dL (ref 30.0–36.0)
MCV: 88 fL (ref 80.0–100.0)
Platelets: 208 10*3/uL (ref 150–400)
RBC: 3.5 MIL/uL — ABNORMAL LOW (ref 4.22–5.81)
RDW: 18.2 % — ABNORMAL HIGH (ref 11.5–15.5)
WBC: 9.6 10*3/uL (ref 4.0–10.5)
nRBC: 0 % (ref 0.0–0.2)

## 2020-06-17 LAB — MAGNESIUM: Magnesium: 2.3 mg/dL (ref 1.7–2.4)

## 2020-06-17 LAB — PHOSPHORUS: Phosphorus: 3.8 mg/dL (ref 2.5–4.6)

## 2020-06-17 MED ORDER — METOLAZONE 5 MG PO TABS
10.0000 mg | ORAL_TABLET | Freq: Once | ORAL | Status: AC
  Administered 2020-06-17: 10 mg
  Filled 2020-06-17: qty 2

## 2020-06-17 MED ORDER — POTASSIUM CHLORIDE 20 MEQ PO PACK
40.0000 meq | PACK | Freq: Two times a day (BID) | ORAL | Status: AC
  Administered 2020-06-17 (×2): 40 meq
  Filled 2020-06-17 (×2): qty 2

## 2020-06-17 MED FILL — Medication: Qty: 1 | Status: AC

## 2020-06-17 NOTE — Progress Notes (Signed)
RT attempted pt on ATC, but pt complaining that he can't "relax with his breathing". RT placed pt on full vent support and pt states he feels better. RN aware.
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
## 2020-06-17 NOTE — Progress Notes (Signed)
Pt tolerated 2 hours of PSV. Placed pt back on full support due to pt complaining of SOB.

## 2020-06-17 NOTE — Progress Notes (Signed)
Inpatient Rehabilitation-Admissions Coordinator   Per CM note, pt continues to have difficulty weaning to trach collar and has been referred to [facility]. AC will no longer pursue CIR actively. Will sign off. Please contact me if dispo were to change.   Raechel Ache, OTR/L  Rehab Admissions Coordinator  [phone] 06/17/2020 10:21 AM

## 2020-06-18 LAB — BASIC METABOLIC PANEL
Anion gap: 11 (ref 5–15)
BUN: 45 mg/dL — ABNORMAL HIGH (ref 8–23)
CO2: 30 mmol/L (ref 22–32)
Calcium: 8.6 mg/dL — ABNORMAL LOW (ref 8.9–10.3)
Chloride: 109 mmol/L (ref 98–111)
Creatinine, Ser: 1.35 mg/dL — ABNORMAL HIGH (ref 0.61–1.24)
GFR, Estimated: 53 mL/min — ABNORMAL LOW (ref >60)
Glucose, Bld: 134 mg/dL — ABNORMAL HIGH (ref 70–99)
Potassium: 3.4 mmol/L — ABNORMAL LOW (ref 3.5–5.1)
Sodium: 150 mmol/L — ABNORMAL HIGH (ref 135–145)

## 2020-06-18 LAB — CBC
HCT: 29.1 % — ABNORMAL LOW (ref 39.0–52.0)
Hemoglobin: 9.1 g/dL — ABNORMAL LOW (ref 13.0–17.0)
MCH: 27.6 pg (ref 26.0–34.0)
MCHC: 31.3 g/dL (ref 30.0–36.0)
MCV: 88.2 fL (ref 80.0–100.0)
Platelets: 228 10*3/uL (ref 150–400)
RBC: 3.3 MIL/uL — ABNORMAL LOW (ref 4.22–5.81)
RDW: 18.1 % — ABNORMAL HIGH (ref 11.5–15.5)
WBC: 9.3 10*3/uL (ref 4.0–10.5)
nRBC: 0 % (ref 0.0–0.2)

## 2020-06-18 LAB — GLUCOSE, CAPILLARY
Glucose-Capillary: 132 mg/dL — ABNORMAL HIGH (ref 70–99)
Glucose-Capillary: 135 mg/dL — ABNORMAL HIGH (ref 70–99)
Glucose-Capillary: 143 mg/dL — ABNORMAL HIGH (ref 70–99)
Glucose-Capillary: 125 mg/dL — ABNORMAL HIGH (ref 70–99)
Glucose-Capillary: 138 mg/dL — ABNORMAL HIGH (ref 70–99)
Glucose-Capillary: 139 mg/dL — ABNORMAL HIGH (ref 70–99)

## 2020-06-18 LAB — CULTURE, RESPIRATORY W GRAM STAIN

## 2020-06-18 LAB — PHOSPHORUS: Phosphorus: 3.3 mg/dL (ref 2.5–4.6)

## 2020-06-18 LAB — MAGNESIUM: Magnesium: 2.4 mg/dL (ref 1.7–2.4)

## 2020-06-18 MED ORDER — POTASSIUM CHLORIDE 20 MEQ PO PACK
40.0000 meq | PACK | Freq: Once | ORAL | Status: AC
  Administered 2020-06-18: 40 meq
  Filled 2020-06-18: qty 2

## 2020-06-18 MED ORDER — METOLAZONE 5 MG PO TABS
10.0000 mg | ORAL_TABLET | Freq: Once | ORAL | Status: AC
  Administered 2020-06-18: 10 mg
  Filled 2020-06-18: qty 2

## 2020-06-18 MED ORDER — FUROSEMIDE 10 MG/ML IJ SOLN
40.0000 mg | Freq: Once | INTRAMUSCULAR | Status: AC
  Administered 2020-06-18: 40 mg via INTRAVENOUS
  Filled 2020-06-18: qty 4

## 2020-06-18 MED ORDER — SODIUM CHLORIDE 0.9 % IV SOLN
2.0000 g | Freq: Three times a day (TID) | INTRAVENOUS | Status: DC
  Administered 2020-06-18 – 2020-06-19 (×4): 2 g via INTRAVENOUS
  Filled 2020-06-18 (×5): qty 2

## 2020-06-18 NOTE — TOC Progression Note (Addendum)
Transition of Care [facility]) - Progression Note    Patient Details  Name: Daniel [name] MRN: [id number] Date of Birth: 16-Jul-1940  Transition of Care [facility]) CM/SW Contact  Ella Bodo, RN Phone Number: 06/18/2020, 12:12 PM  Clinical Narrative:   Insurance authorization started today for [name].  Will follow with updates as they are available.  Addendum: 06/18/20 1629  Payor requested upfront peer to peer with attending MD.  This was completed by Dr. Bobbye Zettler.  Pt currently denied by payor for LTAC admission pending continued weaning attempts for 5-7 more days.  Will plan to resubmit on 2/7 or 2/8 pending continued weaning.  [name] with [facility] admissions updated.      Expected Discharge Plan: Long Term Acute Care (LTAC) Barriers to Discharge: Continued Medical Work up  Expected Discharge Plan and Services Expected Discharge Plan: Long Term Acute Care (LTAC)   Discharge Planning Services: CM Consult Post Acute Care Choice: Long Term Acute Care (LTAC) Living arrangements for the past 2 months: Single Family Home                                       Social Determinants of Health (SDOH) Interventions    Readmission Risk Interventions No flowsheet data found.  Reinaldo Raddle, RN, BSN  Trauma/Neuro ICU Case Manager [phone]
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
## 2020-06-19 LAB — CBC
HCT: 30.3 % — ABNORMAL LOW (ref 39.0–52.0)
Hemoglobin: 8.7 g/dL — ABNORMAL LOW (ref 13.0–17.0)
MCH: 25.7 pg — ABNORMAL LOW (ref 26.0–34.0)
MCHC: 28.7 g/dL — ABNORMAL LOW (ref 30.0–36.0)
MCV: 89.6 fL (ref 80.0–100.0)
Platelets: 212 10*3/uL (ref 150–400)
RBC: 3.38 MIL/uL — ABNORMAL LOW (ref 4.22–5.81)
RDW: 18 % — ABNORMAL HIGH (ref 11.5–15.5)
WBC: 11.1 10*3/uL — ABNORMAL HIGH (ref 4.0–10.5)
nRBC: 0 % (ref 0.0–0.2)

## 2020-06-19 LAB — BASIC METABOLIC PANEL
Anion gap: 10 (ref 5–15)
BUN: 47 mg/dL — ABNORMAL HIGH (ref 8–23)
CO2: 33 mmol/L — ABNORMAL HIGH (ref 22–32)
Calcium: 8.7 mg/dL — ABNORMAL LOW (ref 8.9–10.3)
Chloride: 107 mmol/L (ref 98–111)
Creatinine, Ser: 1.52 mg/dL — ABNORMAL HIGH (ref 0.61–1.24)
GFR, Estimated: 46 mL/min — ABNORMAL LOW (ref >60)
Glucose, Bld: 162 mg/dL — ABNORMAL HIGH (ref 70–99)
Potassium: 3.2 mmol/L — ABNORMAL LOW (ref 3.5–5.1)
Sodium: 150 mmol/L — ABNORMAL HIGH (ref 135–145)

## 2020-06-19 LAB — GLUCOSE, CAPILLARY
Glucose-Capillary: 143 mg/dL — ABNORMAL HIGH (ref 70–99)
Glucose-Capillary: 154 mg/dL — ABNORMAL HIGH (ref 70–99)
Glucose-Capillary: 134 mg/dL — ABNORMAL HIGH (ref 70–99)
Glucose-Capillary: 141 mg/dL — ABNORMAL HIGH (ref 70–99)
Glucose-Capillary: 142 mg/dL — ABNORMAL HIGH (ref 70–99)
Glucose-Capillary: 125 mg/dL — ABNORMAL HIGH (ref 70–99)

## 2020-06-19 LAB — PHOSPHORUS: Phosphorus: 3.8 mg/dL (ref 2.5–4.6)

## 2020-06-19 LAB — MAGNESIUM: Magnesium: 2.2 mg/dL (ref 1.7–2.4)

## 2020-06-19 MED ORDER — SODIUM CHLORIDE 0.9 % IV SOLN
2.0000 g | Freq: Two times a day (BID) | INTRAVENOUS | Status: DC
  Administered 2020-06-19 – 2020-06-23 (×8): 2 g via INTRAVENOUS
  Filled 2020-06-19 (×8): qty 2

## 2020-06-19 NOTE — Progress Notes (Signed)
PHARMACY NOTE:  ANTIMICROBIAL RENAL DOSAGE ADJUSTMENT  Current antimicrobial regimen includes a mismatch between antimicrobial dosage and estimated renal function.  As per policy approved by the Pharmacy & Therapeutics and Medical Executive Committees, the antimicrobial dosage will be adjusted accordingly.  Current antimicrobial dosage:  Cefepime 2g IV q8h  Indication: PNA  Renal Function:  Estimated Creatinine Clearance: 50.6 mL/min (A) (by C-G formula based on SCr of 1.52 mg/dL (H)).    Antimicrobial dosage has been changed to:  Cefepime 2g IV q12h   Arturo Lobb, PharmD, BCPS Please check AMION for all [place] contact numbers Clinical Pharmacist 06/19/2020 2:08 PM
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
## 2020-06-20 LAB — BASIC METABOLIC PANEL
Anion gap: 13 (ref 5–15)
BUN: 46 mg/dL — ABNORMAL HIGH (ref 8–23)
CO2: 31 mmol/L (ref 22–32)
Calcium: 8.5 mg/dL — ABNORMAL LOW (ref 8.9–10.3)
Chloride: 104 mmol/L (ref 98–111)
Creatinine, Ser: 1.41 mg/dL — ABNORMAL HIGH (ref 0.61–1.24)
GFR, Estimated: 51 mL/min — ABNORMAL LOW (ref >60)
Glucose, Bld: 143 mg/dL — ABNORMAL HIGH (ref 70–99)
Potassium: 2.8 mmol/L — ABNORMAL LOW (ref 3.5–5.1)
Sodium: 148 mmol/L — ABNORMAL HIGH (ref 135–145)

## 2020-06-20 LAB — PHOSPHORUS: Phosphorus: 3.6 mg/dL (ref 2.5–4.6)

## 2020-06-20 LAB — GLUCOSE, CAPILLARY
Glucose-Capillary: 139 mg/dL — ABNORMAL HIGH (ref 70–99)
Glucose-Capillary: 134 mg/dL — ABNORMAL HIGH (ref 70–99)
Glucose-Capillary: 146 mg/dL — ABNORMAL HIGH (ref 70–99)
Glucose-Capillary: 130 mg/dL — ABNORMAL HIGH (ref 70–99)
Glucose-Capillary: 135 mg/dL — ABNORMAL HIGH (ref 70–99)

## 2020-06-20 LAB — CBC
HCT: 29.3 % — ABNORMAL LOW (ref 39.0–52.0)
Hemoglobin: 8.6 g/dL — ABNORMAL LOW (ref 13.0–17.0)
MCH: 26 pg (ref 26.0–34.0)
MCHC: 29.4 g/dL — ABNORMAL LOW (ref 30.0–36.0)
MCV: 88.5 fL (ref 80.0–100.0)
Platelets: 189 10*3/uL (ref 150–400)
RBC: 3.31 MIL/uL — ABNORMAL LOW (ref 4.22–5.81)
RDW: 17.7 % — ABNORMAL HIGH (ref 11.5–15.5)
WBC: 11.3 10*3/uL — ABNORMAL HIGH (ref 4.0–10.5)
nRBC: 0 % (ref 0.0–0.2)

## 2020-06-20 LAB — MAGNESIUM: Magnesium: 2.3 mg/dL (ref 1.7–2.4)

## 2020-06-20 MED ORDER — POTASSIUM CHLORIDE 20 MEQ PO PACK
80.0000 meq | PACK | Freq: Once | ORAL | Status: AC
  Administered 2020-06-20: 80 meq
  Filled 2020-06-20: qty 4

## 2020-06-20 NOTE — Progress Notes (Signed)
Patient decannulated himself while on trach collar. RT placed #6 shiley with good color change. Lurline Idol ties secure. Vitals are stable. Patient placed back on the vent.
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
## 2020-06-20 NOTE — Progress Notes (Signed)
This RN notified that patient was found with his trach removed and laying on his chest. Obturator placed by Delsa Sale RN and patient put on NRB at [facility], RT and MD notified of event. Kayla, RT to bedside to replace trach, see accompanying RT note. Patient VS stable throughout event, PRN ativan given and patient resting comfortably at this time.  Candy Sledge, RN

## 2020-06-21 LAB — GLUCOSE, CAPILLARY
Glucose-Capillary: 102 mg/dL — ABNORMAL HIGH (ref 70–99)
Glucose-Capillary: 109 mg/dL — ABNORMAL HIGH (ref 70–99)
Glucose-Capillary: 117 mg/dL — ABNORMAL HIGH (ref 70–99)
Glucose-Capillary: 119 mg/dL — ABNORMAL HIGH (ref 70–99)
Glucose-Capillary: 142 mg/dL — ABNORMAL HIGH (ref 70–99)
Glucose-Capillary: 158 mg/dL — ABNORMAL HIGH (ref 70–99)
Glucose-Capillary: 99 mg/dL (ref 70–99)

## 2020-06-21 LAB — BASIC METABOLIC PANEL
Anion gap: 10 (ref 5–15)
BUN: 47 mg/dL — ABNORMAL HIGH (ref 8–23)
CO2: 31 mmol/L (ref 22–32)
Calcium: 8.5 mg/dL — ABNORMAL LOW (ref 8.9–10.3)
Chloride: 106 mmol/L (ref 98–111)
Creatinine, Ser: 1.37 mg/dL — ABNORMAL HIGH (ref 0.61–1.24)
GFR, Estimated: 52 mL/min — ABNORMAL LOW (ref >60)
Glucose, Bld: 138 mg/dL — ABNORMAL HIGH (ref 70–99)
Potassium: 2.9 mmol/L — ABNORMAL LOW (ref 3.5–5.1)
Sodium: 147 mmol/L — ABNORMAL HIGH (ref 135–145)

## 2020-06-21 LAB — CBC
HCT: 28.6 % — ABNORMAL LOW (ref 39.0–52.0)
Hemoglobin: 8.8 g/dL — ABNORMAL LOW (ref 13.0–17.0)
MCH: 27.1 pg (ref 26.0–34.0)
MCHC: 30.8 g/dL (ref 30.0–36.0)
MCV: 88 fL (ref 80.0–100.0)
Platelets: 188 10*3/uL (ref 150–400)
RBC: 3.25 MIL/uL — ABNORMAL LOW (ref 4.22–5.81)
RDW: 17.6 % — ABNORMAL HIGH (ref 11.5–15.5)
WBC: 9.7 10*3/uL (ref 4.0–10.5)
nRBC: 0 % (ref 0.0–0.2)

## 2020-06-21 LAB — MAGNESIUM: Magnesium: 2.4 mg/dL (ref 1.7–2.4)

## 2020-06-21 LAB — PHOSPHORUS: Phosphorus: 3.3 mg/dL (ref 2.5–4.6)

## 2020-06-21 MED ORDER — METOLAZONE 5 MG PO TABS
10.0000 mg | ORAL_TABLET | Freq: Once | ORAL | Status: AC
  Administered 2020-06-21: 10 mg
  Filled 2020-06-21: qty 2

## 2020-06-21 MED ORDER — POTASSIUM CHLORIDE 10 MEQ/100ML IV SOLN
10.0000 meq | INTRAVENOUS | Status: AC
  Administered 2020-06-21 (×2): 10 meq via INTRAVENOUS
  Filled 2020-06-21 (×2): qty 100

## 2020-06-21 MED ORDER — POTASSIUM CHLORIDE 20 MEQ PO PACK
40.0000 meq | PACK | Freq: Two times a day (BID) | ORAL | Status: AC
  Administered 2020-06-21 (×2): 40 meq
  Filled 2020-06-21 (×2): qty 2
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
## 2020-06-21 NOTE — Progress Notes (Signed)
SLP Cancellation Note  Patient Details Name: Daniel [name] MRN: [id number] DOB: 10/01/1940   Cancelled treatment:        Spoke with RT earlier this am re: inline PMV. She reported pt's event yesterday of self decannulation and stated he looked fatigued this morning. Will attempt PMV tomorrow (inline or if he tolerates trach collar).    [name] 06/21/2020, 3:19 PM   Orbie Pyo Colvin Caroli.Ed Risk analyst [phone] Office [phone]
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
## 2020-06-22 LAB — GLUCOSE, CAPILLARY
Glucose-Capillary: 145 mg/dL — ABNORMAL HIGH (ref 70–99)
Glucose-Capillary: 134 mg/dL — ABNORMAL HIGH (ref 70–99)
Glucose-Capillary: 118 mg/dL — ABNORMAL HIGH (ref 70–99)
Glucose-Capillary: 111 mg/dL — ABNORMAL HIGH (ref 70–99)
Glucose-Capillary: 101 mg/dL — ABNORMAL HIGH (ref 70–99)
Glucose-Capillary: 91 mg/dL (ref 70–99)

## 2020-06-22 LAB — BASIC METABOLIC PANEL
Anion gap: 13 (ref 5–15)
BUN: 46 mg/dL — ABNORMAL HIGH (ref 8–23)
CO2: 28 mmol/L (ref 22–32)
Calcium: 8.5 mg/dL — ABNORMAL LOW (ref 8.9–10.3)
Chloride: 105 mmol/L (ref 98–111)
Creatinine, Ser: 1.24 mg/dL (ref 0.61–1.24)
GFR, Estimated: 59 mL/min — ABNORMAL LOW (ref >60)
Glucose, Bld: 155 mg/dL — ABNORMAL HIGH (ref 70–99)
Potassium: 3 mmol/L — ABNORMAL LOW (ref 3.5–5.1)
Sodium: 146 mmol/L — ABNORMAL HIGH (ref 135–145)

## 2020-06-22 MED ORDER — METOLAZONE 5 MG PO TABS
10.0000 mg | ORAL_TABLET | Freq: Once | ORAL | Status: AC
  Administered 2020-06-22: 10 mg via ORAL
  Filled 2020-06-22: qty 2

## 2020-06-22 MED ORDER — POTASSIUM CHLORIDE 20 MEQ PO PACK
40.0000 meq | PACK | Freq: Three times a day (TID) | ORAL | Status: AC
  Administered 2020-06-22 (×3): 40 meq
  Filled 2020-06-22 (×3): qty 2

## 2020-06-22 MED ORDER — POTASSIUM CHLORIDE 10 MEQ/100ML IV SOLN
10.0000 meq | INTRAVENOUS | Status: AC
  Administered 2020-06-22 (×2): 10 meq via INTRAVENOUS
  Filled 2020-06-22 (×2): qty 100
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
## 2020-06-23 LAB — BASIC METABOLIC PANEL
Anion gap: 10 (ref 5–15)
BUN: 44 mg/dL — ABNORMAL HIGH (ref 8–23)
CO2: 28 mmol/L (ref 22–32)
Calcium: 8.7 mg/dL — ABNORMAL LOW (ref 8.9–10.3)
Chloride: 109 mmol/L (ref 98–111)
Creatinine, Ser: 1.19 mg/dL (ref 0.61–1.24)
GFR, Estimated: >60 mL/min (ref >60)
Glucose, Bld: 104 mg/dL — ABNORMAL HIGH (ref 70–99)
Potassium: 3.8 mmol/L (ref 3.5–5.1)
Sodium: 147 mmol/L — ABNORMAL HIGH (ref 135–145)

## 2020-06-23 LAB — GLUCOSE, CAPILLARY
Glucose-Capillary: 149 mg/dL — ABNORMAL HIGH (ref 70–99)
Glucose-Capillary: 116 mg/dL — ABNORMAL HIGH (ref 70–99)
Glucose-Capillary: 126 mg/dL — ABNORMAL HIGH (ref 70–99)
Glucose-Capillary: 141 mg/dL — ABNORMAL HIGH (ref 70–99)
Glucose-Capillary: 107 mg/dL — ABNORMAL HIGH (ref 70–99)
Glucose-Capillary: 91 mg/dL (ref 70–99)

## 2020-06-23 MED ORDER — METOLAZONE 5 MG PO TABS
10.0000 mg | ORAL_TABLET | Freq: Once | ORAL | Status: AC
  Administered 2020-06-23: 10 mg
  Filled 2020-06-23: qty 2

## 2020-06-23 MED ORDER — METHOCARBAMOL 500 MG PO TABS: 1000.0000 mg | ORAL_TABLET | Freq: Three times a day (TID) | ORAL | Status: DC | PRN

## 2020-06-23 MED ORDER — SODIUM CHLORIDE 0.9 % IV SOLN
2.0000 g | Freq: Three times a day (TID) | INTRAVENOUS | Status: AC
  Administered 2020-06-23 – 2020-06-25 (×8): 2 g via INTRAVENOUS
  Filled 2020-06-23 (×8): qty 2
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
## 2020-06-23 NOTE — Progress Notes (Signed)
PHARMACY NOTE:  ANTIMICROBIAL RENAL DOSAGE ADJUSTMENT  Current antimicrobial regimen includes a mismatch between antimicrobial dosage and estimated renal function.  As per policy approved by the Pharmacy & Therapeutics and Medical Executive Committees, the antimicrobial dosage will be adjusted accordingly.  Current antimicrobial dosage:  Cefepime 2 gm IV Q 12 hours  Indication: Pneumonia  Renal Function:  Estimated Creatinine Clearance: 64.2 mL/min (by C-G formula based on SCr of 1.19 mg/dL).     Antimicrobial dosage has been changed to:  Cefepime 2 gm IV Q 8 hours for improved renal function   Additional comments:   Thank you for allowing pharmacy to be a part of this patient's care.  Albertina Parr, PharmD., BCPS, BCCCP Clinical Pharmacist Please refer to [facility] for unit-specific pharmacist

## 2020-06-24 LAB — BASIC METABOLIC PANEL
Anion gap: 11 (ref 5–15)
BUN: 44 mg/dL — ABNORMAL HIGH (ref 8–23)
CO2: 29 mmol/L (ref 22–32)
Calcium: 8.8 mg/dL — ABNORMAL LOW (ref 8.9–10.3)
Chloride: 106 mmol/L (ref 98–111)
Creatinine, Ser: 1.17 mg/dL (ref 0.61–1.24)
GFR, Estimated: >60 mL/min (ref >60)
Glucose, Bld: 126 mg/dL — ABNORMAL HIGH (ref 70–99)
Potassium: 3.2 mmol/L — ABNORMAL LOW (ref 3.5–5.1)
Sodium: 146 mmol/L — ABNORMAL HIGH (ref 135–145)

## 2020-06-24 LAB — GLUCOSE, CAPILLARY
Glucose-Capillary: 139 mg/dL — ABNORMAL HIGH (ref 70–99)
Glucose-Capillary: 116 mg/dL — ABNORMAL HIGH (ref 70–99)
Glucose-Capillary: 125 mg/dL — ABNORMAL HIGH (ref 70–99)
Glucose-Capillary: 125 mg/dL — ABNORMAL HIGH (ref 70–99)
Glucose-Capillary: 110 mg/dL — ABNORMAL HIGH (ref 70–99)
Glucose-Capillary: 99 mg/dL (ref 70–99)

## 2020-06-24 MED ORDER — ZINC OXIDE 40 % EX OINT
TOPICAL_OINTMENT | Freq: Two times a day (BID) | CUTANEOUS | Status: DC
  Administered 2020-06-26 – 2020-06-28 (×2): 1 via TOPICAL
  Filled 2020-06-24: qty 57

## 2020-06-24 MED ORDER — METOLAZONE 5 MG PO TABS
15.0000 mg | ORAL_TABLET | Freq: Once | ORAL | Status: AC
  Administered 2020-06-24: 15 mg via ORAL
  Filled 2020-06-24: qty 3

## 2020-06-24 MED ORDER — POTASSIUM CHLORIDE 20 MEQ PO PACK
40.0000 meq | PACK | Freq: Two times a day (BID) | ORAL | Status: AC
  Administered 2020-06-24 (×2): 40 meq via ORAL
  Filled 2020-06-24 (×2): qty 2
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
## 2020-06-25 ENCOUNTER — Inpatient Hospital Stay (HOSPITAL_COMMUNITY): Payer: Medicare Other

## 2020-06-25 LAB — BASIC METABOLIC PANEL
Anion gap: 9 (ref 5–15)
BUN: 46 mg/dL — ABNORMAL HIGH (ref 8–23)
CO2: 29 mmol/L (ref 22–32)
Calcium: 8.7 mg/dL — ABNORMAL LOW (ref 8.9–10.3)
Chloride: 108 mmol/L (ref 98–111)
Creatinine, Ser: 1.24 mg/dL (ref 0.61–1.24)
GFR, Estimated: 59 mL/min — ABNORMAL LOW (ref >60)
Glucose, Bld: 154 mg/dL — ABNORMAL HIGH (ref 70–99)
Potassium: 3.4 mmol/L — ABNORMAL LOW (ref 3.5–5.1)
Sodium: 146 mmol/L — ABNORMAL HIGH (ref 135–145)

## 2020-06-25 LAB — GLUCOSE, CAPILLARY
Glucose-Capillary: 152 mg/dL — ABNORMAL HIGH (ref 70–99)
Glucose-Capillary: 122 mg/dL — ABNORMAL HIGH (ref 70–99)
Glucose-Capillary: 115 mg/dL — ABNORMAL HIGH (ref 70–99)
Glucose-Capillary: 111 mg/dL — ABNORMAL HIGH (ref 70–99)
Glucose-Capillary: 126 mg/dL — ABNORMAL HIGH (ref 70–99)
Glucose-Capillary: 103 mg/dL — ABNORMAL HIGH (ref 70–99)

## 2020-06-25 MED ORDER — METOLAZONE 5 MG PO TABS
15.0000 mg | ORAL_TABLET | Freq: Once | ORAL | Status: AC
  Administered 2020-06-25: 15 mg
  Filled 2020-06-25: qty 3

## 2020-06-25 MED ORDER — CLONAZEPAM 0.25 MG PO TBDP
0.2500 mg | ORAL_TABLET | Freq: Two times a day (BID) | ORAL | Status: DC
  Administered 2020-06-25 – 2020-06-28 (×6): 0.25 mg
  Filled 2020-06-25 (×6): qty 1

## 2020-06-25 MED ORDER — POTASSIUM CHLORIDE 20 MEQ PO PACK
40.0000 meq | PACK | Freq: Once | ORAL | Status: AC
  Administered 2020-06-25: 40 meq
  Filled 2020-06-25: qty 2

## 2020-06-25 MED ORDER — CHLORHEXIDINE GLUCONATE 0.12 % MT SOLN
OROMUCOSAL | Status: AC
  Filled 2020-06-25: qty 15

## 2020-06-25 MED ORDER — CLONAZEPAM 0.25 MG PO TBDP: 0.2500 mg | ORAL_TABLET | Freq: Two times a day (BID) | ORAL | Status: DC

## 2020-06-25 MED ORDER — CLONAZEPAM 0.5 MG PO TABS: 0.2500 mg | ORAL_TABLET | Freq: Two times a day (BID) | ORAL | Status: DC
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
## 2020-06-25 NOTE — Consult Note (Addendum)
Meta Nurse wound follow up Refer to previous [facility] consult note on 2/10; bedside nurse requesting a re-assessment of posterior scrotum wound.  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 Red, macerated, .8X.8X.1cm: location and appearance is consistent with full thickness skin loss from previous Promofit application and adhesive related skin injury. This is not a pressure injury. Current Promofit which is in place to attempt to contain urine is applied away from the affected area, as is appropriate, to promote healing. Topical treatment orders have been provided previously for barrier cream to be applied to promote healing, but if continuous shear is an issue, then foam dressing can be applied to protect from further injury.  Please re-consult if further assistance is needed.  Thank-you,  Julien Girt MSN, [place], [place], [place], [place]

## 2020-06-25 NOTE — Plan of Care (Signed)
°  Problem: Elimination: Goal: Will not experience complications related to bowel motility Outcome: Progressing Goal: Will not experience complications related to urinary retention Outcome: Progressing   Problem: Pain Managment: Goal: General experience of comfort will improve Outcome: Progressing

## 2020-06-25 NOTE — Progress Notes (Addendum)
Modified Barium Swallow Progress Note  Patient Details  Name: Wyatte Dames MRN: [id number] Date of Birth: 04/27/1941  Today's Date: 06/25/2020  Modified Barium Swallow completed.  Full report located under Chart Review in the Imaging Section.  Brief recommendations include the following:   Clinical Impression Mr. Delmundo received Ativan approximately 2.5 hours prior MBS and was awake, participatory but drowsy during study with PMV donned. He demonstrated mild oropharyngeal dysphagia marked by effortful manipulation and control (mild) with difficulty forming and maintaining boluses intermittently. Transit pattern was piecemeal at times and lingual residue spilled to valleculae and reflexive swallow cleared. Timng and coordination of once swallow began was adequate with complete closure of laryngeal vestibule. However, use of straw allowed barium to enter vestibule prior to closure reaching vocal cords, without cough. Residue was minimal after thin and infrequent. Pt on trach collar for study, on nocturnal vent and has tolerated trach collar ranging from 2-7 hours during the day thus far. Recommend while on trach collar only, thin liquids, no straws, floor stock applesauce, pudding, ice cream, icee with speaking valve donned with full supervision. Once tolerating trach collar for increased amounts of time will order consistent meal trays from kitchen and recommend texture.        Swallow Evaluation Recommendations    See above impression statement                         [name] 06/25/2020,4:02 PM Orbie Pyo Colvin Caroli.Ed Risk analyst [phone] Office [phone]

## 2020-06-26 LAB — BASIC METABOLIC PANEL
Anion gap: 10 (ref 5–15)
BUN: 44 mg/dL — ABNORMAL HIGH (ref 8–23)
CO2: 29 mmol/L (ref 22–32)
Calcium: 8.7 mg/dL — ABNORMAL LOW (ref 8.9–10.3)
Chloride: 106 mmol/L (ref 98–111)
Creatinine, Ser: 1.09 mg/dL (ref 0.61–1.24)
GFR, Estimated: >60 mL/min (ref >60)
Glucose, Bld: 137 mg/dL — ABNORMAL HIGH (ref 70–99)
Potassium: 3.2 mmol/L — ABNORMAL LOW (ref 3.5–5.1)
Sodium: 145 mmol/L (ref 135–145)

## 2020-06-26 LAB — GLUCOSE, CAPILLARY
Glucose-Capillary: 141 mg/dL — ABNORMAL HIGH (ref 70–99)
Glucose-Capillary: 141 mg/dL — ABNORMAL HIGH (ref 70–99)
Glucose-Capillary: 114 mg/dL — ABNORMAL HIGH (ref 70–99)
Glucose-Capillary: 133 mg/dL — ABNORMAL HIGH (ref 70–99)
Glucose-Capillary: 133 mg/dL — ABNORMAL HIGH (ref 70–99)
Glucose-Capillary: 101 mg/dL — ABNORMAL HIGH (ref 70–99)

## 2020-06-26 MED ORDER — METOLAZONE 5 MG PO TABS
20.0000 mg | ORAL_TABLET | Freq: Once | ORAL | Status: AC
  Administered 2020-06-26: 20 mg
  Filled 2020-06-26: qty 4

## 2020-06-26 MED ORDER — POTASSIUM CHLORIDE 20 MEQ PO PACK
40.0000 meq | PACK | Freq: Two times a day (BID) | ORAL | Status: AC
  Administered 2020-06-26 (×2): 40 meq
  Filled 2020-06-26 (×2): qty 2

## 2020-06-26 MED ORDER — FREE WATER
200.0000 mL | Freq: Three times a day (TID) | Status: DC
  Administered 2020-06-26 – 2020-06-28 (×7): 200 mL

## 2020-06-26 NOTE — Progress Notes (Signed)
Patient stating that he was having difficulty breathing.  Placed patient back on ventilator, on full support settings, and is currently tolerating well.  Will continue to monitor.
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
## 2020-06-27 LAB — GLUCOSE, CAPILLARY
Glucose-Capillary: 107 mg/dL — ABNORMAL HIGH (ref 70–99)
Glucose-Capillary: 113 mg/dL — ABNORMAL HIGH (ref 70–99)
Glucose-Capillary: 132 mg/dL — ABNORMAL HIGH (ref 70–99)
Glucose-Capillary: 140 mg/dL — ABNORMAL HIGH (ref 70–99)
Glucose-Capillary: 144 mg/dL — ABNORMAL HIGH (ref 70–99)
Glucose-Capillary: 95 mg/dL (ref 70–99)

## 2020-06-27 LAB — BASIC METABOLIC PANEL WITH GFR
Anion gap: 9 (ref 5–15)
BUN: 44 mg/dL — ABNORMAL HIGH (ref 8–23)
CO2: 31 mmol/L (ref 22–32)
Calcium: 8.9 mg/dL (ref 8.9–10.3)
Chloride: 107 mmol/L (ref 98–111)
Creatinine, Ser: 1.18 mg/dL (ref 0.61–1.24)
GFR, Estimated: >60 mL/min
Glucose, Bld: 150 mg/dL — ABNORMAL HIGH (ref 70–99)
Potassium: 3.4 mmol/L — ABNORMAL LOW (ref 3.5–5.1)
Sodium: 147 mmol/L — ABNORMAL HIGH (ref 135–145)

## 2020-06-27 LAB — MAGNESIUM: Magnesium: 2 mg/dL (ref 1.7–2.4)

## 2020-06-27 MED ORDER — POTASSIUM CHLORIDE 20 MEQ PO PACK
40.0000 meq | PACK | Freq: Once | ORAL | Status: AC
  Administered 2020-06-27: 40 meq
  Filled 2020-06-27: qty 2

## 2020-06-27 MED ORDER — METOLAZONE 5 MG PO TABS
10.0000 mg | ORAL_TABLET | Freq: Once | ORAL | Status: AC
  Administered 2020-06-27: 10 mg
  Filled 2020-06-27: qty 2
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
## 2020-06-27 NOTE — Progress Notes (Signed)
RT NOTE: patient placed on 40% ATC.  Currently tolerating well.  RT will continue to monitor.

## 2020-06-28 ENCOUNTER — Other Ambulatory Visit (HOSPITAL_COMMUNITY): Payer: Self-pay

## 2020-06-28 ENCOUNTER — Institutional Professional Consult (permissible substitution)
Admission: AD | Admit: 2020-06-28 | Discharge: 2020-07-07 | Disposition: A | Discharge status: Skilled Nursing Facility | Payer: Medicare Other | Source: Other Acute Inpatient Hospital | Attending: Internal Medicine | Admitting: Internal Medicine

## 2020-06-28 DIAGNOSIS — K3189 Other diseases of stomach and duodenum: Secondary | ICD-10-CM | POA: Diagnosis not present

## 2020-06-28 DIAGNOSIS — S06309S Unspecified focal traumatic brain injury with loss of consciousness of unspecified duration, sequela: Secondary | ICD-10-CM | POA: Diagnosis not present

## 2020-06-28 DIAGNOSIS — I469 Cardiac arrest, cause unspecified: Secondary | ICD-10-CM | POA: Diagnosis not present

## 2020-06-28 DIAGNOSIS — E870 Hyperosmolality and hypernatremia: Secondary | ICD-10-CM | POA: Diagnosis not present

## 2020-06-28 DIAGNOSIS — I482 Chronic atrial fibrillation, unspecified: Secondary | ICD-10-CM | POA: Diagnosis not present

## 2020-06-28 DIAGNOSIS — Z7901 Long term (current) use of anticoagulants: Secondary | ICD-10-CM | POA: Diagnosis not present

## 2020-06-28 DIAGNOSIS — S06309D Unspecified focal traumatic brain injury with loss of consciousness of unspecified duration, subsequent encounter: Secondary | ICD-10-CM | POA: Diagnosis not present

## 2020-06-28 DIAGNOSIS — Z930 Tracheostomy status: Secondary | ICD-10-CM | POA: Diagnosis not present

## 2020-06-28 DIAGNOSIS — J9690 Respiratory failure, unspecified, unspecified whether with hypoxia or hypercapnia: Secondary | ICD-10-CM | POA: Diagnosis not present

## 2020-06-28 DIAGNOSIS — I517 Cardiomegaly: Secondary | ICD-10-CM | POA: Diagnosis not present

## 2020-06-28 DIAGNOSIS — H748X2 Other specified disorders of left middle ear and mastoid: Secondary | ICD-10-CM | POA: Diagnosis not present

## 2020-06-28 DIAGNOSIS — L89893 Pressure ulcer of other site, stage 3: Secondary | ICD-10-CM | POA: Diagnosis not present

## 2020-06-28 DIAGNOSIS — Z9911 Dependence on respirator [ventilator] status: Secondary | ICD-10-CM | POA: Diagnosis not present

## 2020-06-28 DIAGNOSIS — E039 Hypothyroidism, unspecified: Secondary | ICD-10-CM | POA: Diagnosis not present

## 2020-06-28 DIAGNOSIS — J9610 Chronic respiratory failure, unspecified whether with hypoxia or hypercapnia: Secondary | ICD-10-CM | POA: Diagnosis not present

## 2020-06-28 DIAGNOSIS — R0602 Shortness of breath: Secondary | ICD-10-CM | POA: Diagnosis not present

## 2020-06-28 DIAGNOSIS — J189 Pneumonia, unspecified organism: Secondary | ICD-10-CM | POA: Diagnosis not present

## 2020-06-28 DIAGNOSIS — J9621 Acute and chronic respiratory failure with hypoxia: Secondary | ICD-10-CM | POA: Diagnosis present

## 2020-06-28 DIAGNOSIS — S02609D Fracture of mandible, unspecified, subsequent encounter for fracture with routine healing: Secondary | ICD-10-CM | POA: Diagnosis not present

## 2020-06-28 DIAGNOSIS — I4821 Permanent atrial fibrillation: Secondary | ICD-10-CM | POA: Diagnosis not present

## 2020-06-28 DIAGNOSIS — J9692 Respiratory failure, unspecified with hypercapnia: Secondary | ICD-10-CM | POA: Diagnosis not present

## 2020-06-28 DIAGNOSIS — R0902 Hypoxemia: Secondary | ICD-10-CM | POA: Diagnosis not present

## 2020-06-28 DIAGNOSIS — R5381 Other malaise: Secondary | ICD-10-CM | POA: Diagnosis not present

## 2020-06-28 DIAGNOSIS — Z4682 Encounter for fitting and adjustment of non-vascular catheter: Secondary | ICD-10-CM | POA: Diagnosis not present

## 2020-06-28 DIAGNOSIS — J9622 Acute and chronic respiratory failure with hypercapnia: Secondary | ICD-10-CM | POA: Diagnosis not present

## 2020-06-28 DIAGNOSIS — M2548 Effusion, other site: Secondary | ICD-10-CM | POA: Diagnosis not present

## 2020-06-28 DIAGNOSIS — I5031 Acute diastolic (congestive) heart failure: Secondary | ICD-10-CM | POA: Diagnosis not present

## 2020-06-28 DIAGNOSIS — J440 Chronic obstructive pulmonary disease with acute lower respiratory infection: Secondary | ICD-10-CM | POA: Diagnosis not present

## 2020-06-28 DIAGNOSIS — I4891 Unspecified atrial fibrillation: Secondary | ICD-10-CM | POA: Diagnosis not present

## 2020-06-28 DIAGNOSIS — S0269XD Fracture of mandible of other specified site, subsequent encounter for fracture with routine healing: Secondary | ICD-10-CM | POA: Diagnosis not present

## 2020-06-28 DIAGNOSIS — I739 Peripheral vascular disease, unspecified: Secondary | ICD-10-CM | POA: Diagnosis not present

## 2020-06-28 DIAGNOSIS — R490 Dysphonia: Secondary | ICD-10-CM | POA: Diagnosis not present

## 2020-06-28 DIAGNOSIS — S02601A Fracture of unspecified part of body of right mandible, initial encounter for closed fracture: Secondary | ICD-10-CM | POA: Diagnosis not present

## 2020-06-28 DIAGNOSIS — Z8674 Personal history of sudden cardiac arrest: Secondary | ICD-10-CM | POA: Diagnosis not present

## 2020-06-28 DIAGNOSIS — J90 Pleural effusion, not elsewhere classified: Secondary | ICD-10-CM | POA: Diagnosis not present

## 2020-06-28 DIAGNOSIS — R079 Chest pain, unspecified: Secondary | ICD-10-CM | POA: Diagnosis not present

## 2020-06-28 DIAGNOSIS — J208 Acute bronchitis due to other specified organisms: Secondary | ICD-10-CM | POA: Diagnosis not present

## 2020-06-28 DIAGNOSIS — D72829 Elevated white blood cell count, unspecified: Secondary | ICD-10-CM | POA: Diagnosis not present

## 2020-06-28 DIAGNOSIS — I2510 Atherosclerotic heart disease of native coronary artery without angina pectoris: Secondary | ICD-10-CM | POA: Diagnosis not present

## 2020-06-28 DIAGNOSIS — R140 Abdominal distension (gaseous): Secondary | ICD-10-CM | POA: Diagnosis not present

## 2020-06-28 DIAGNOSIS — K6389 Other specified diseases of intestine: Secondary | ICD-10-CM | POA: Diagnosis not present

## 2020-06-28 DIAGNOSIS — L89611 Pressure ulcer of right heel, stage 1: Secondary | ICD-10-CM | POA: Diagnosis not present

## 2020-06-28 DIAGNOSIS — R062 Wheezing: Secondary | ICD-10-CM | POA: Diagnosis not present

## 2020-06-28 DIAGNOSIS — S02651A Fracture of angle of right mandible, initial encounter for closed fracture: Secondary | ICD-10-CM | POA: Diagnosis not present

## 2020-06-28 DIAGNOSIS — J9811 Atelectasis: Secondary | ICD-10-CM | POA: Diagnosis not present

## 2020-06-28 DIAGNOSIS — J9691 Respiratory failure, unspecified with hypoxia: Secondary | ICD-10-CM | POA: Diagnosis not present

## 2020-06-28 DIAGNOSIS — G0490 Encephalitis and encephalomyelitis, unspecified: Secondary | ICD-10-CM | POA: Diagnosis not present

## 2020-06-28 DIAGNOSIS — D62 Acute posthemorrhagic anemia: Secondary | ICD-10-CM | POA: Diagnosis not present

## 2020-06-28 DIAGNOSIS — J449 Chronic obstructive pulmonary disease, unspecified: Secondary | ICD-10-CM | POA: Diagnosis not present

## 2020-06-28 DIAGNOSIS — K9423 Gastrostomy malfunction: Secondary | ICD-10-CM | POA: Diagnosis not present

## 2020-06-28 DIAGNOSIS — J9612 Chronic respiratory failure with hypercapnia: Secondary | ICD-10-CM | POA: Diagnosis not present

## 2020-06-28 DIAGNOSIS — Z431 Encounter for attention to gastrostomy: Secondary | ICD-10-CM | POA: Diagnosis not present

## 2020-06-28 DIAGNOSIS — Z931 Gastrostomy status: Secondary | ICD-10-CM

## 2020-06-28 DIAGNOSIS — G9340 Encephalopathy, unspecified: Secondary | ICD-10-CM | POA: Diagnosis not present

## 2020-06-28 DIAGNOSIS — J40 Bronchitis, not specified as acute or chronic: Secondary | ICD-10-CM | POA: Diagnosis not present

## 2020-06-28 DIAGNOSIS — J811 Chronic pulmonary edema: Secondary | ICD-10-CM | POA: Diagnosis not present

## 2020-06-28 DIAGNOSIS — N182 Chronic kidney disease, stage 2 (mild): Secondary | ICD-10-CM | POA: Diagnosis not present

## 2020-06-28 DIAGNOSIS — I248 Other forms of acute ischemic heart disease: Secondary | ICD-10-CM | POA: Diagnosis not present

## 2020-06-28 DIAGNOSIS — N1330 Unspecified hydronephrosis: Secondary | ICD-10-CM | POA: Diagnosis not present

## 2020-06-28 DIAGNOSIS — N179 Acute kidney failure, unspecified: Secondary | ICD-10-CM | POA: Diagnosis not present

## 2020-06-28 DIAGNOSIS — R1310 Dysphagia, unspecified: Secondary | ICD-10-CM | POA: Diagnosis not present

## 2020-06-28 DIAGNOSIS — E876 Hypokalemia: Secondary | ICD-10-CM | POA: Diagnosis not present

## 2020-06-28 DIAGNOSIS — Z743 Need for continuous supervision: Secondary | ICD-10-CM | POA: Diagnosis not present

## 2020-06-28 DIAGNOSIS — R4789 Other speech disturbances: Secondary | ICD-10-CM | POA: Diagnosis not present

## 2020-06-28 DIAGNOSIS — I422 Other hypertrophic cardiomyopathy: Secondary | ICD-10-CM | POA: Diagnosis not present

## 2020-06-28 DIAGNOSIS — J9620 Acute and chronic respiratory failure, unspecified whether with hypoxia or hypercapnia: Secondary | ICD-10-CM | POA: Diagnosis not present

## 2020-06-28 DIAGNOSIS — I959 Hypotension, unspecified: Secondary | ICD-10-CM | POA: Diagnosis not present

## 2020-06-28 DIAGNOSIS — R69 Illness, unspecified: Secondary | ICD-10-CM | POA: Diagnosis not present

## 2020-06-28 DIAGNOSIS — J9600 Acute respiratory failure, unspecified whether with hypoxia or hypercapnia: Secondary | ICD-10-CM | POA: Diagnosis not present

## 2020-06-28 DIAGNOSIS — E46 Unspecified protein-calorie malnutrition: Secondary | ICD-10-CM | POA: Diagnosis not present

## 2020-06-28 DIAGNOSIS — R0689 Other abnormalities of breathing: Secondary | ICD-10-CM | POA: Diagnosis not present

## 2020-06-28 DIAGNOSIS — D3500 Benign neoplasm of unspecified adrenal gland: Secondary | ICD-10-CM | POA: Diagnosis not present

## 2020-06-28 HISTORY — DX: Chronic atrial fibrillation, unspecified: I48.20

## 2020-06-28 HISTORY — DX: Cardiac arrest, cause unspecified: I46.9

## 2020-06-28 HISTORY — DX: Acute and chronic respiratory failure with hypoxia: J96.21

## 2020-06-28 HISTORY — DX: Pneumonia, unspecified organism: J18.9

## 2020-06-28 HISTORY — DX: Pleural effusion, not elsewhere classified: J90

## 2020-06-28 LAB — BASIC METABOLIC PANEL
Anion gap: 10 (ref 5–15)
BUN: 42 mg/dL — ABNORMAL HIGH (ref 8–23)
CO2: 28 mmol/L (ref 22–32)
Calcium: 8.9 mg/dL (ref 8.9–10.3)
Chloride: 105 mmol/L (ref 98–111)
Creatinine, Ser: 1.09 mg/dL (ref 0.61–1.24)
GFR, Estimated: >60 mL/min (ref >60)
Glucose, Bld: 145 mg/dL — ABNORMAL HIGH (ref 70–99)
Potassium: 3.1 mmol/L — ABNORMAL LOW (ref 3.5–5.1)
Sodium: 143 mmol/L (ref 135–145)

## 2020-06-28 LAB — CBC
HCT: 33.2 % — ABNORMAL LOW (ref 39.0–52.0)
Hemoglobin: 9.6 g/dL — ABNORMAL LOW (ref 13.0–17.0)
MCH: 26.4 pg (ref 26.0–34.0)
MCHC: 28.9 g/dL — ABNORMAL LOW (ref 30.0–36.0)
MCV: 91.2 fL (ref 80.0–100.0)
Platelets: 174 10*3/uL (ref 150–400)
RBC: 3.64 MIL/uL — ABNORMAL LOW (ref 4.22–5.81)
RDW: 18 % — ABNORMAL HIGH (ref 11.5–15.5)
WBC: 9.9 10*3/uL (ref 4.0–10.5)
nRBC: 0 % (ref 0.0–0.2)

## 2020-06-28 LAB — GLUCOSE, CAPILLARY
Glucose-Capillary: 115 mg/dL — ABNORMAL HIGH (ref 70–99)
Glucose-Capillary: 137 mg/dL — ABNORMAL HIGH (ref 70–99)
Glucose-Capillary: 132 mg/dL — ABNORMAL HIGH (ref 70–99)
Glucose-Capillary: 117 mg/dL — ABNORMAL HIGH (ref 70–99)

## 2020-06-28 IMAGING — DX DG ABDOMEN 1V
2 series · 2 of 2 positions shown · non-contrast
Comparison: Radiograph [DATE]

CLINICAL DATA: Peg placement.

EXAM:
ABDOMEN - 1 VIEW

[abdomen kub (1 of 2)]
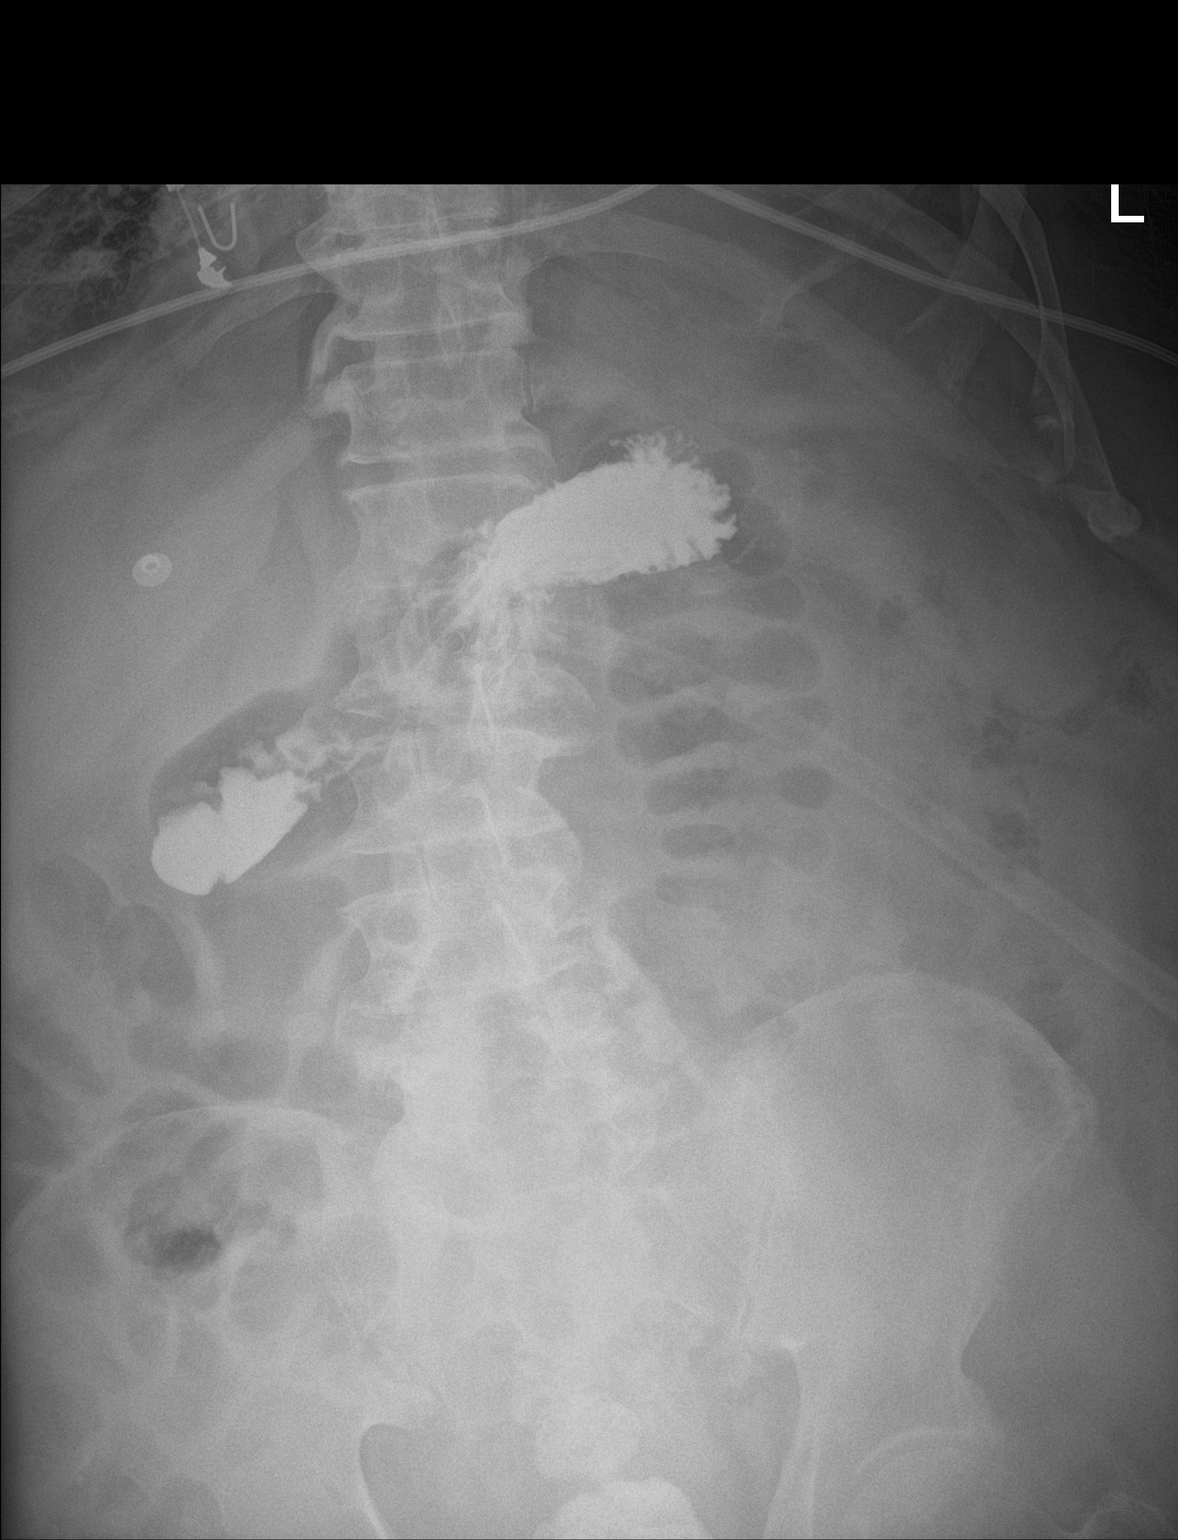

[abdomen kub (2 of 2)]
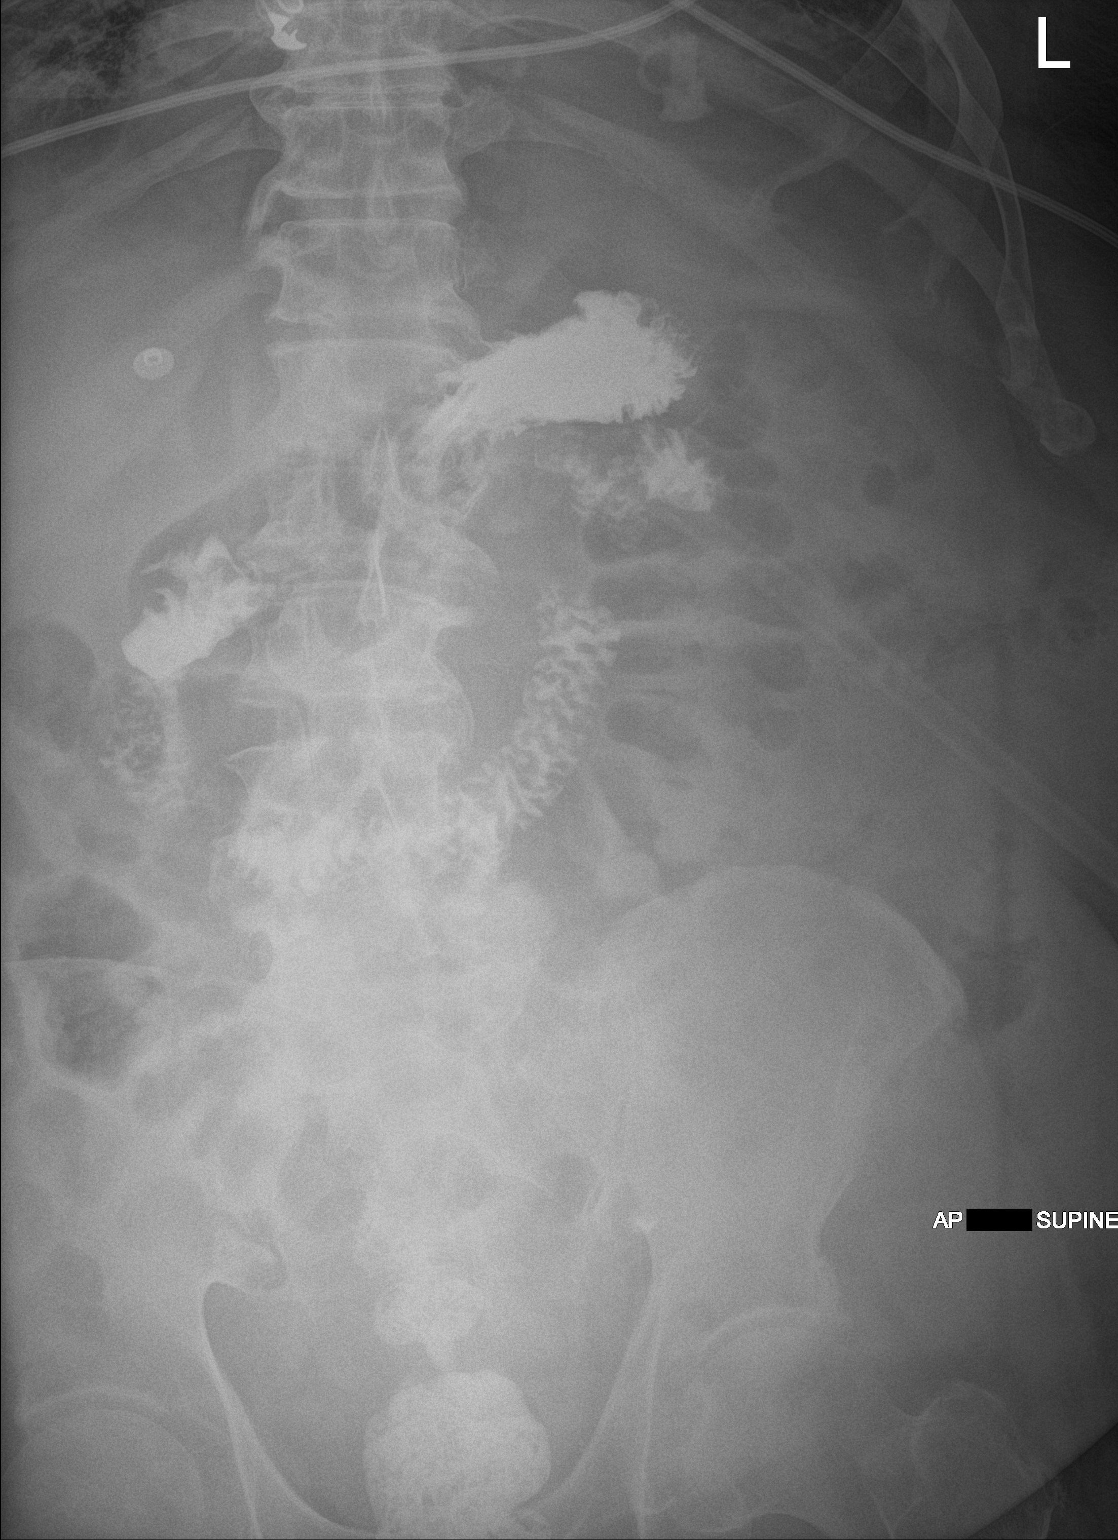

[2 of 2 positions shown; findings below may reference images not displayed]

FINDINGS: Two portable AP supine views of the abdomen obtained after the
installation of enteric contrast through indwelling gastrostomy
tube. Type and amount of contrast not specified. Gastrostomy tube
projects over the stomach. Contrast opacifies the stomach and
duodenum. There is no evidence of extravasation or leak. Barium in
the colon is likely from prior barium swallow. Decreased colonic
distension from prior radiograph.
IMPRESSION: Gastrostomy tube in the stomach without evidence of extravasation or
leak.

## 2020-06-28 MED ORDER — POLYETHYLENE GLYCOL 3350 17 G PO PACK: 17.0000 g | PACK | Freq: Every day | ORAL | 0 refills | Status: DC | PRN

## 2020-06-28 MED ORDER — QUETIAPINE FUMARATE 50 MG PO TABS: 50.0000 mg | ORAL_TABLET | Freq: Every day | ORAL | 0 refills | Status: DC

## 2020-06-28 MED ORDER — ZINC OXIDE 40 % EX OINT: TOPICAL_OINTMENT | Freq: Two times a day (BID) | CUTANEOUS | 0 refills | Status: DC

## 2020-06-28 MED ORDER — CARVEDILOL 25 MG PO TABS: 25.0000 mg | ORAL_TABLET | Freq: Two times a day (BID) | ORAL | Status: DC

## 2020-06-28 MED ORDER — GERHARDT'S BUTT CREAM: 1.0000 "application " | TOPICAL_CREAM | Freq: Two times a day (BID) | CUTANEOUS | Status: DC

## 2020-06-28 MED ORDER — IOHEXOL 300 MG/ML  SOLN
50.0000 mL | Freq: Once | INTRAMUSCULAR | Status: AC | PRN
  Administered 2020-06-28: 50 mL

## 2020-06-28 MED ORDER — FREE WATER: 200.0000 mL | Freq: Three times a day (TID) | Status: DC

## 2020-06-28 MED ORDER — CLONAZEPAM 0.25 MG PO TBDP: 0.2500 mg | ORAL_TABLET | Freq: Two times a day (BID) | ORAL | 0 refills | Status: DC

## 2020-06-28 MED ORDER — METHOCARBAMOL 500 MG PO TABS: 1000.0000 mg | ORAL_TABLET | Freq: Three times a day (TID) | ORAL | Status: DC | PRN

## 2020-06-28 MED ORDER — GUAIFENESIN 100 MG/5ML PO SOLN: 15.0000 mL | ORAL | 0 refills | Status: DC

## 2020-06-28 MED ORDER — ACETAMINOPHEN 500 MG PO TABS: 1000.0000 mg | ORAL_TABLET | Freq: Three times a day (TID) | ORAL | 0 refills | Status: AC | PRN

## 2020-06-28 MED ORDER — PANTOPRAZOLE SODIUM 40 MG PO PACK: 40.0000 mg | PACK | Freq: Every day | ORAL | Status: DC

## 2020-06-28 MED ORDER — OXYCODONE HCL 5 MG/5ML PO SOLN: 5.0000 mg | Freq: Four times a day (QID) | ORAL | 0 refills | Status: DC | PRN

## 2020-06-28 MED ORDER — APIXABAN 5 MG PO TABS: 5.0000 mg | ORAL_TABLET | Freq: Two times a day (BID) | ORAL | Status: DC

## 2020-06-28 MED ORDER — ALBUTEROL SULFATE (2.5 MG/3ML) 0.083% IN NEBU: 2.5000 mg | INHALATION_SOLUTION | Freq: Four times a day (QID) | RESPIRATORY_TRACT | 12 refills | Status: DC | PRN

## 2020-06-28 NOTE — Progress Notes (Signed)
RT placed patient on ATC per patient request to try again. RN aware. RT will continue to monitor.
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
## 2020-06-28 NOTE — Progress Notes (Signed)
SLP Cancellation Note  Patient Details Name: Daniel [name] MRN: [id number] DOB: 10/17/1940   Cancelled treatment:        Pt tolerated trach collar for a limited time this morning and is back on ventilator. Unable to consume po's during day, unless attempts TC later. RN reported his anxiety was high this morning.    [name] 06/28/2020, 11:50 AM   Orbie Pyo Colvin Caroli.Ed Risk analyst [phone] Office [phone]
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
## 2020-06-28 NOTE — TOC Transition Note (Addendum)
Transition of Care [facility]) - CM/SW Discharge Note   Patient Details  Name: Daniel [name] MRN: [id number] Date of Birth: 07-23-1940  Transition of Care [facility]) CM/SW Contact:  Ella Bodo, RN Phone Number: 06/28/2020, 12:27 PM   Clinical Narrative:   Insurance appeal approved for admission to [place].  Per LTAC admissions, pt will have bed available after 5pm today.  Notified pt/daughter, and they are agreeable to plan.   Will update bedside nurse with discharge details as available.    Addendum:  Bedside nurse to call report around 5pm, with plan to dc to LTAC when bed ready.    Patient has been discussed in multidisciplinary Trauma Rounds.     Final next level of care: Long Term Acute Care (LTAC) Barriers to Discharge: Barriers Resolved   Patient Goals and CMS Choice   CMS Medicare.gov Compare Post Acute Care list provided to:: Patient Represenative (must comment) Choice offered to / list presented to : Adult Children                        Discharge Plan and Services   Discharge Planning Services: CM Consult Post Acute Care Choice: Long Term Acute Care (LTAC)                               Social Determinants of Health (SDOH) Interventions     Readmission Risk Interventions Readmission Risk Prevention Plan 06/28/2020  Transportation Screening Complete  PCP or Specialist Appt within 5-7 Days Complete  Home Care Screening Complete  Medication Review (RN CM) Complete   Reinaldo Raddle, RN, BSN  Trauma/Neuro ICU Case Manager [phone]

## 2020-06-29 DIAGNOSIS — I482 Chronic atrial fibrillation, unspecified: Secondary | ICD-10-CM | POA: Diagnosis not present

## 2020-06-29 DIAGNOSIS — J9690 Respiratory failure, unspecified, unspecified whether with hypoxia or hypercapnia: Secondary | ICD-10-CM | POA: Diagnosis not present

## 2020-06-29 DIAGNOSIS — D62 Acute posthemorrhagic anemia: Secondary | ICD-10-CM | POA: Diagnosis not present

## 2020-06-29 DIAGNOSIS — E870 Hyperosmolality and hypernatremia: Secondary | ICD-10-CM | POA: Diagnosis not present

## 2020-06-29 LAB — BASIC METABOLIC PANEL
Anion gap: 8 (ref 5–15)
BUN: 40 mg/dL — ABNORMAL HIGH (ref 8–23)
CO2: 32 mmol/L (ref 22–32)
Calcium: 9 mg/dL (ref 8.9–10.3)
Chloride: 107 mmol/L (ref 98–111)
Creatinine, Ser: 1.17 mg/dL (ref 0.61–1.24)
GFR, Estimated: >60 mL/min (ref >60)
Glucose, Bld: 140 mg/dL — ABNORMAL HIGH (ref 70–99)
Potassium: 3 mmol/L — ABNORMAL LOW (ref 3.5–5.1)
Sodium: 147 mmol/L — ABNORMAL HIGH (ref 135–145)

## 2020-06-29 LAB — CBC
HCT: 30.7 % — ABNORMAL LOW (ref 39.0–52.0)
Hemoglobin: 9.1 g/dL — ABNORMAL LOW (ref 13.0–17.0)
MCH: 26.8 pg (ref 26.0–34.0)
MCHC: 29.6 g/dL — ABNORMAL LOW (ref 30.0–36.0)
MCV: 90.6 fL (ref 80.0–100.0)
Platelets: 161 10*3/uL (ref 150–400)
RBC: 3.39 MIL/uL — ABNORMAL LOW (ref 4.22–5.81)
RDW: 17.9 % — ABNORMAL HIGH (ref 11.5–15.5)
WBC: 7.7 10*3/uL (ref 4.0–10.5)
nRBC: 0 % (ref 0.0–0.2)

## 2020-06-30 DIAGNOSIS — S02651A Fracture of angle of right mandible, initial encounter for closed fracture: Secondary | ICD-10-CM | POA: Diagnosis not present

## 2020-06-30 DIAGNOSIS — J9690 Respiratory failure, unspecified, unspecified whether with hypoxia or hypercapnia: Secondary | ICD-10-CM | POA: Diagnosis not present

## 2020-06-30 DIAGNOSIS — J90 Pleural effusion, not elsewhere classified: Secondary | ICD-10-CM | POA: Diagnosis not present

## 2020-06-30 DIAGNOSIS — I469 Cardiac arrest, cause unspecified: Secondary | ICD-10-CM

## 2020-06-30 DIAGNOSIS — D62 Acute posthemorrhagic anemia: Secondary | ICD-10-CM | POA: Diagnosis not present

## 2020-06-30 DIAGNOSIS — I482 Chronic atrial fibrillation, unspecified: Secondary | ICD-10-CM

## 2020-06-30 DIAGNOSIS — J9621 Acute and chronic respiratory failure with hypoxia: Secondary | ICD-10-CM

## 2020-06-30 DIAGNOSIS — J189 Pneumonia, unspecified organism: Secondary | ICD-10-CM

## 2020-06-30 LAB — BASIC METABOLIC PANEL
Anion gap: 12 (ref 5–15)
BUN: 44 mg/dL — ABNORMAL HIGH (ref 8–23)
CO2: 28 mmol/L (ref 22–32)
Calcium: 9 mg/dL (ref 8.9–10.3)
Chloride: 106 mmol/L (ref 98–111)
Creatinine, Ser: 1.05 mg/dL (ref 0.61–1.24)
GFR, Estimated: >60 mL/min (ref >60)
Glucose, Bld: 136 mg/dL — ABNORMAL HIGH (ref 70–99)
Potassium: 2.8 mmol/L — ABNORMAL LOW (ref 3.5–5.1)
Sodium: 146 mmol/L — ABNORMAL HIGH (ref 135–145)
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
## 2020-07-01 DIAGNOSIS — J9690 Respiratory failure, unspecified, unspecified whether with hypoxia or hypercapnia: Secondary | ICD-10-CM | POA: Diagnosis not present

## 2020-07-01 DIAGNOSIS — J9621 Acute and chronic respiratory failure with hypoxia: Secondary | ICD-10-CM | POA: Diagnosis not present

## 2020-07-01 DIAGNOSIS — I482 Chronic atrial fibrillation, unspecified: Secondary | ICD-10-CM | POA: Diagnosis not present

## 2020-07-01 DIAGNOSIS — I469 Cardiac arrest, cause unspecified: Secondary | ICD-10-CM | POA: Diagnosis not present

## 2020-07-01 DIAGNOSIS — S02651A Fracture of angle of right mandible, initial encounter for closed fracture: Secondary | ICD-10-CM | POA: Diagnosis not present

## 2020-07-01 DIAGNOSIS — J90 Pleural effusion, not elsewhere classified: Secondary | ICD-10-CM | POA: Diagnosis not present

## 2020-07-01 DIAGNOSIS — D62 Acute posthemorrhagic anemia: Secondary | ICD-10-CM | POA: Diagnosis not present

## 2020-07-01 LAB — BASIC METABOLIC PANEL
Anion gap: 11 (ref 5–15)
BUN: 35 mg/dL — ABNORMAL HIGH (ref 8–23)
CO2: 28 mmol/L (ref 22–32)
Calcium: 9 mg/dL (ref 8.9–10.3)
Chloride: 104 mmol/L (ref 98–111)
Creatinine, Ser: 1.06 mg/dL (ref 0.61–1.24)
GFR, Estimated: >60 mL/min (ref >60)
Glucose, Bld: 121 mg/dL — ABNORMAL HIGH (ref 70–99)
Potassium: 3.2 mmol/L — ABNORMAL LOW (ref 3.5–5.1)
Sodium: 143 mmol/L (ref 135–145)

## 2020-07-01 NOTE — Progress Notes (Signed)
Pulmonary Critical Care Medicine [place]   PULMONARY CRITICAL CARE SERVICE  PROGRESS NOTE  Date of Service: 07/01/2020  Daniel [name]  [id number]:[id number]  DOB: [birth year]   DOA: 06/28/2020  Referring Physician: Merton Border, MD  HPI: Daniel [name] is a 80 y.o. male seen for follow up of Acute on Chronic Respiratory Failure.  Patient currently is on assist control mode has been on 30% FiO2 with good saturations  Medications: Reviewed on Rounds  Physical Exam:  Vitals: Temperature is 96.0 pulse 64 respiratory rate 16 blood pressure is 112/71 saturations 99%  Ventilator Settings on assist control FiO2 30% tidal volume 580 PEEP 5  . General: Comfortable at this time . Eyes: Grossly normal lids, irises & conjunctiva . ENT: grossly tongue is normal . Neck: no obvious mass . Cardiovascular: S1 S2 normal no gallop . Respiratory: Scattered rhonchi very coarse breath sound . Abdomen: soft . Skin: no rash seen on limited exam . Musculoskeletal: not rigid . Psychiatric:unable to assess . Neurologic: no seizure no involuntary movements         Lab Data:   Basic Metabolic Panel: Recent Labs  Lab 06/29/20 0357 06/30/20 0450 07/01/20 0333  NA 147* 146* 143  K 3.0* 2.8* 3.2*  CL 107 106 104  CO2 32 28 28  GLUCOSE 140* 136* 121*  BUN 40* 44* 35*  CREATININE 1.17 1.05 1.06  CALCIUM 9.0 9.0 9.0    ABG: No results for input(s): PHART, PCO2ART, PO2ART, HCO3, O2SAT in the last 168 hours.  Liver Function Tests: No results for input(s): AST, ALT, ALKPHOS, BILITOT, PROT, ALBUMIN in the last 168 hours. No results for input(s): LIPASE, AMYLASE in the last 168 hours. No results for input(s): AMMONIA in the last 168 hours.  CBC: Recent Labs  Lab 06/29/20 0357  WBC 7.7  HGB 9.1*  HCT 30.7*  MCV 90.6  PLT 161    Cardiac Enzymes: No results for input(s): CKTOTAL, CKMB, CKMBINDEX, TROPONINI in the last 168 hours.  BNP (last 3 results) No results for  input(s): BNP in the last 8760 hours.  ProBNP (last 3 results) No results for input(s): PROBNP in the last 8760 hours.  Radiological Exams: No results found.  Assessment/Plan Active Problems:   Acute on chronic respiratory failure with hypoxia (HCC)   Healthcare-associated pneumonia   Cardiac arrest (HCC)   Pleural effusion   Chronic atrial fibrillation ([place])   1. Acute on chronic respiratory failure hypoxia we will continue with full support for now respiratory therapy will assess to wean readiness 2. Chronic atrial fibrillation rate is controlled at this time 3. Healthcare associated pneumonia treated with antibiotics we will continue to monitor 4. Cardiac arrest rhythm has been stable 5. Pleural effusion status post thoracentesis drain   I have personally seen and evaluated the patient, evaluated laboratory and imaging results, formulated the assessment and plan and placed orders. The Patient requires high complexity decision making with multiple systems involvement.  Rounds were done with the Respiratory Therapy Director and Staff therapists and discussed with nursing staff also.  [name], MD [facility] Pulmonary Critical Care Medicine Sleep Medicine

## 2020-07-02 ENCOUNTER — Encounter: Payer: Self-pay | Admitting: Student

## 2020-07-02 ENCOUNTER — Other Ambulatory Visit (HOSPITAL_COMMUNITY): Payer: Self-pay

## 2020-07-02 DIAGNOSIS — D62 Acute posthemorrhagic anemia: Secondary | ICD-10-CM | POA: Diagnosis not present

## 2020-07-02 DIAGNOSIS — J9690 Respiratory failure, unspecified, unspecified whether with hypoxia or hypercapnia: Secondary | ICD-10-CM | POA: Diagnosis not present

## 2020-07-02 DIAGNOSIS — J90 Pleural effusion, not elsewhere classified: Secondary | ICD-10-CM | POA: Diagnosis not present

## 2020-07-02 DIAGNOSIS — S02651A Fracture of angle of right mandible, initial encounter for closed fracture: Secondary | ICD-10-CM | POA: Diagnosis not present

## 2020-07-02 DIAGNOSIS — J9621 Acute and chronic respiratory failure with hypoxia: Secondary | ICD-10-CM | POA: Diagnosis not present

## 2020-07-02 DIAGNOSIS — I517 Cardiomegaly: Secondary | ICD-10-CM | POA: Diagnosis not present

## 2020-07-02 DIAGNOSIS — I469 Cardiac arrest, cause unspecified: Secondary | ICD-10-CM | POA: Diagnosis not present

## 2020-07-02 DIAGNOSIS — I482 Chronic atrial fibrillation, unspecified: Secondary | ICD-10-CM | POA: Diagnosis not present

## 2020-07-02 IMAGING — DX DG CHEST 1V PORT
1 series · 1 of 1 positions shown · non-contrast
Comparison: [DATE]

CLINICAL DATA: Respiratory failure, ventilator

EXAM:
PORTABLE CHEST 1 VIEW

[chest ap]
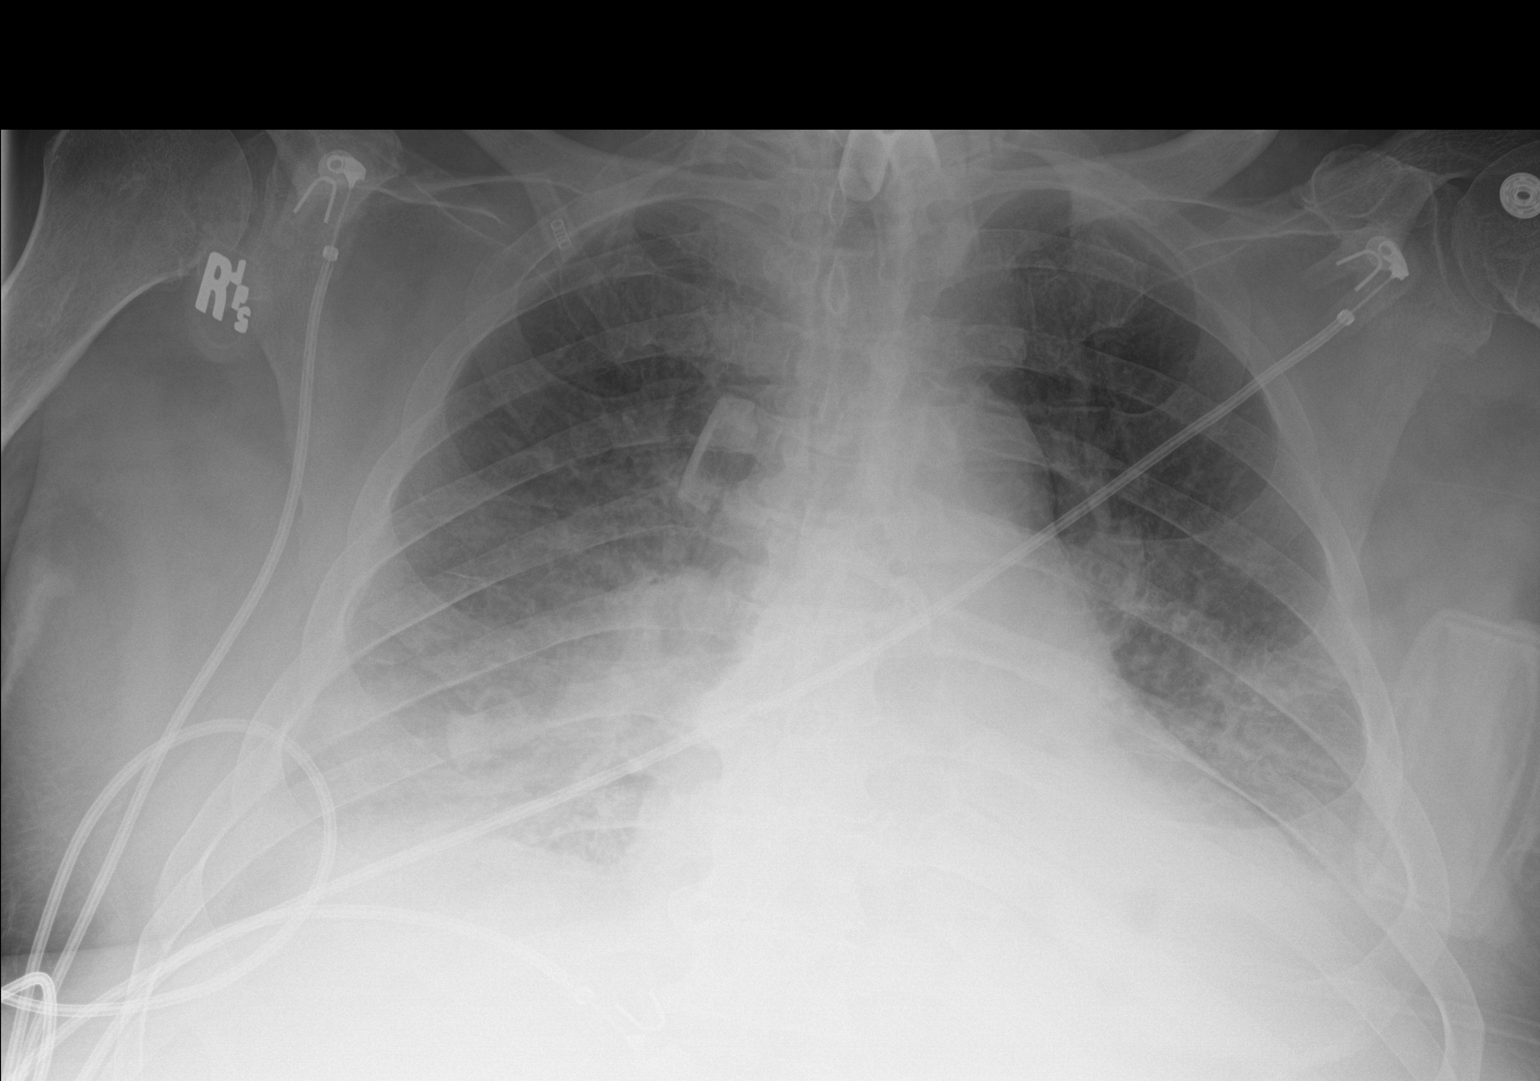

[1 of 1 positions shown; findings below may reference images not displayed]

FINDINGS: Tracheostomy is unchanged. Cardiomegaly, vascular congestion.
Bilateral perihilar and lower lobe opacities may reflect edema.
Suspect small layering effusions. No real change since prior study.
IMPRESSION: Bilateral perihilar and lower lobe opacities, likely edema. Small
layering effusions.

## 2020-07-02 NOTE — Progress Notes (Signed)
Pulmonary Critical Care Medicine [place]   PULMONARY CRITICAL CARE SERVICE  PROGRESS NOTE  Date of Service: 07/02/2020  [name]  [id number]  DOB: [birth year]   DOA: 06/28/2020  Referring Physician: Merton Border, MD  HPI: Daniel [name] is a 80 y.o. male seen for follow up of Acute on Chronic Respiratory Failure.  Patient weaned yesterday for 7 hours and 30 minutes today right now resting will be resuming the weaning  Medications: Reviewed on Rounds  Physical Exam:  Vitals: Temperature is 97.3 pulse 61 respiratory rate 16 blood pressure is 111/65 saturations 96%  Ventilator Settings on assist control FiO2 is 30% tidal volume 580 with a PEEP of 5  . General: Comfortable at this time . Eyes: Grossly normal lids, irises & conjunctiva . ENT: grossly tongue is normal . Neck: no obvious mass . Cardiovascular: S1 S2 normal no gallop . Respiratory: No rhonchi very coarse breath sounds . Abdomen: soft . Skin: no rash seen on limited exam . Musculoskeletal: not rigid . Psychiatric:unable to assess . Neurologic: no seizure no involuntary movements         Lab Data:   Basic Metabolic Panel: Recent Labs  Lab 06/27/20 0402 06/28/20 0934 06/29/20 0357 06/30/20 0450 07/01/20 0333  NA 147* 143 147* 146* 143  K 3.4* 3.1* 3.0* 2.8* 3.2*  CL 107 105 107 106 104  CO2 31 28 32 28 28  GLUCOSE 150* 145* 140* 136* 121*  BUN 44* 42* 40* 44* 35*  CREATININE 1.18 1.09 1.17 1.05 1.06  CALCIUM 8.9 8.9 9.0 9.0 9.0  MG 2.0  --   --   --   --     ABG: No results for input(s): PHART, PCO2ART, PO2ART, HCO3, O2SAT in the last 168 hours.  Liver Function Tests: No results for input(s): AST, ALT, ALKPHOS, BILITOT, PROT, ALBUMIN in the last 168 hours. No results for input(s): LIPASE, AMYLASE in the last 168 hours. No results for input(s): AMMONIA in the last 168 hours.  CBC: Recent Labs  Lab 06/28/20 0934 06/29/20 0357  WBC 9.9 7.7  HGB 9.6* 9.1*  HCT  33.2* 30.7*  MCV 91.2 90.6  PLT 174 161    Cardiac Enzymes: No results for input(s): CKTOTAL, CKMB, CKMBINDEX, TROPONINI in the last 168 hours.  BNP (last 3 results) No results for input(s): BNP in the last 8760 hours.  ProBNP (last 3 results) No results for input(s): PROBNP in the last 8760 hours.  Radiological Exams: DG Chest Port 1 View  Result Date: 07/02/2020 CLINICAL DATA:  Respiratory failure, ventilator EXAM: PORTABLE CHEST 1 VIEW COMPARISON:  06/17/2020 FINDINGS: Tracheostomy is unchanged. Cardiomegaly, vascular congestion. Bilateral perihilar and lower lobe opacities may reflect edema. Suspect small layering effusions. No real change since prior study. IMPRESSION: Bilateral perihilar and lower lobe opacities, likely edema. Small layering effusions. Electronically Signed   By: Rolm Baptise M.D.   On: 07/02/2020 06:25    Assessment/Plan Active Problems:   Acute on chronic respiratory failure with hypoxia (HCC)   Healthcare-associated pneumonia   Cardiac arrest (HCC)   Pleural effusion   Chronic atrial fibrillation ([place])   1. Acute on chronic respiratory failure with hypoxia we will continue with the weaning protocol hopefully will be able to get to at least 12 hours 2. Healthcare associated pneumonia treated 3. Cardiac arrest rhythm is stable 4. Pleural effusion status post drainage 5. Chronic atrial fibrillation rate controlled   I have personally seen and evaluated the patient, evaluated  laboratory and imaging results, formulated the assessment and plan and placed orders. The Patient requires high complexity decision making with multiple systems involvement.  Rounds were done with the Respiratory Therapy Director and Staff therapists and discussed with nursing staff also.  [name], MD [facility] Pulmonary Critical Care Medicine Sleep Medicine

## 2020-07-03 ENCOUNTER — Encounter: Payer: Self-pay | Admitting: Internal Medicine

## 2020-07-03 DIAGNOSIS — J9621 Acute and chronic respiratory failure with hypoxia: Secondary | ICD-10-CM | POA: Diagnosis not present

## 2020-07-03 DIAGNOSIS — I482 Chronic atrial fibrillation, unspecified: Secondary | ICD-10-CM | POA: Diagnosis not present

## 2020-07-03 DIAGNOSIS — E870 Hyperosmolality and hypernatremia: Secondary | ICD-10-CM | POA: Diagnosis not present

## 2020-07-03 DIAGNOSIS — J189 Pneumonia, unspecified organism: Secondary | ICD-10-CM | POA: Diagnosis present

## 2020-07-03 DIAGNOSIS — J90 Pleural effusion, not elsewhere classified: Secondary | ICD-10-CM | POA: Diagnosis not present

## 2020-07-03 DIAGNOSIS — G9340 Encephalopathy, unspecified: Secondary | ICD-10-CM | POA: Diagnosis not present

## 2020-07-03 DIAGNOSIS — I469 Cardiac arrest, cause unspecified: Secondary | ICD-10-CM | POA: Diagnosis present

## 2020-07-03 DIAGNOSIS — J9690 Respiratory failure, unspecified, unspecified whether with hypoxia or hypercapnia: Secondary | ICD-10-CM | POA: Diagnosis not present

## 2020-07-03 LAB — POTASSIUM: Potassium: 3.5 mmol/L (ref 3.5–5.1)

## 2020-07-03 NOTE — Progress Notes (Signed)
Pulmonary Critical Care Medicine [place]   PULMONARY CRITICAL CARE SERVICE  PROGRESS NOTE  Date of Service: 07/03/2020  Daniel [name]  [id number]  DOB: 08-Feb-1941   DOA: 06/28/2020  Referring Physician: Merton Border, MD  HPI: Daniel [name] is a 80 y.o. male seen for follow up of Acute on Chronic Respiratory Failure.  Currently is on full support on the ventilator apparently not yet started on weaning.  Medications: Reviewed on Rounds  Physical Exam:  Vitals: Temperature is 96.7 pulse 54 respiratory rate 16 blood pressure is 120/60 saturations 100%  Ventilator Settings on assist control FiO2 30% tidal volume 580 PEEP 5  . General: Comfortable at this time . Eyes: Grossly normal lids, irises & conjunctiva . ENT: grossly tongue is normal . Neck: no obvious mass . Cardiovascular: S1 S2 normal no gallop . Respiratory: No rhonchi very coarse breath sounds . Abdomen: soft . Skin: no rash seen on limited exam . Musculoskeletal: not rigid . Psychiatric:unable to assess . Neurologic: no seizure no involuntary movements         Lab Data:   Basic Metabolic Panel: Recent Labs  Lab 06/27/20 0402 06/28/20 0934 06/29/20 0357 06/30/20 0450 07/01/20 0333 07/03/20 1001  NA 147* 143 147* 146* 143  --   K 3.4* 3.1* 3.0* 2.8* 3.2* 3.5  CL 107 105 107 106 104  --   CO2 31 28 32 28 28  --   GLUCOSE 150* 145* 140* 136* 121*  --   BUN 44* 42* 40* 44* 35*  --   CREATININE 1.18 1.09 1.17 1.05 1.06  --   CALCIUM 8.9 8.9 9.0 9.0 9.0  --   MG 2.0  --   --   --   --   --     ABG: No results for input(s): PHART, PCO2ART, PO2ART, HCO3, O2SAT in the last 168 hours.  Liver Function Tests: No results for input(s): AST, ALT, ALKPHOS, BILITOT, PROT, ALBUMIN in the last 168 hours. No results for input(s): LIPASE, AMYLASE in the last 168 hours. No results for input(s): AMMONIA in the last 168 hours.  CBC: Recent Labs  Lab 06/28/20 0934 06/29/20 0357  WBC  9.9 7.7  HGB 9.6* 9.1*  HCT 33.2* 30.7*  MCV 91.2 90.6  PLT 174 161    Cardiac Enzymes: No results for input(s): CKTOTAL, CKMB, CKMBINDEX, TROPONINI in the last 168 hours.  BNP (last 3 results) No results for input(s): BNP in the last 8760 hours.  ProBNP (last 3 results) No results for input(s): PROBNP in the last 8760 hours.  Radiological Exams: DG Chest Port 1 View  Result Date: 07/02/2020 CLINICAL DATA:  Respiratory failure, ventilator EXAM: PORTABLE CHEST 1 VIEW COMPARISON:  06/17/2020 FINDINGS: Tracheostomy is unchanged. Cardiomegaly, vascular congestion. Bilateral perihilar and lower lobe opacities may reflect edema. Suspect small layering effusions. No real change since prior study. IMPRESSION: Bilateral perihilar and lower lobe opacities, likely edema. Small layering effusions. Electronically Signed   By: Rolm Baptise M.D.   On: 07/02/2020 06:25    Assessment/Plan Active Problems:   Acute on chronic respiratory failure with hypoxia (HCC)   Healthcare-associated pneumonia   Cardiac arrest (HCC)   Pleural effusion   Chronic atrial fibrillation ([place])   1. Acute on chronic respiratory failure with hypoxia we will continue with assist control mode titrate oxygen continue pulmonary toilet. 2. Healthcare associated pneumonia treated slowly improving 3. Cardiac arrest rhythm stable 4. Chronic atrial fibrillation rate is controlled 5. Pleural  effusion no change we will continue to follow along   I have personally seen and evaluated the patient, evaluated laboratory and imaging results, formulated the assessment and plan and placed orders. The Patient requires high complexity decision making with multiple systems involvement.  Rounds were done with the Respiratory Therapy Director and Staff therapists and discussed with nursing staff also.  [name], MD [facility] Pulmonary Critical Care Medicine Sleep Medicine

## 2020-07-04 ENCOUNTER — Other Ambulatory Visit: Payer: Self-pay

## 2020-07-04 DIAGNOSIS — J9621 Acute and chronic respiratory failure with hypoxia: Secondary | ICD-10-CM | POA: Diagnosis not present

## 2020-07-04 DIAGNOSIS — S02651A Fracture of angle of right mandible, initial encounter for closed fracture: Secondary | ICD-10-CM | POA: Diagnosis not present

## 2020-07-04 DIAGNOSIS — J9690 Respiratory failure, unspecified, unspecified whether with hypoxia or hypercapnia: Secondary | ICD-10-CM | POA: Diagnosis not present

## 2020-07-04 DIAGNOSIS — I482 Chronic atrial fibrillation, unspecified: Secondary | ICD-10-CM | POA: Diagnosis not present

## 2020-07-04 DIAGNOSIS — D62 Acute posthemorrhagic anemia: Secondary | ICD-10-CM | POA: Diagnosis not present

## 2020-07-04 DIAGNOSIS — J90 Pleural effusion, not elsewhere classified: Secondary | ICD-10-CM | POA: Diagnosis not present

## 2020-07-04 DIAGNOSIS — I469 Cardiac arrest, cause unspecified: Secondary | ICD-10-CM | POA: Diagnosis not present

## 2020-07-04 LAB — CBC
HCT: 32.2 % — ABNORMAL LOW (ref 39.0–52.0)
Hemoglobin: 9.7 g/dL — ABNORMAL LOW (ref 13.0–17.0)
MCH: 27.1 pg (ref 26.0–34.0)
MCHC: 30.1 g/dL (ref 30.0–36.0)
MCV: 89.9 fL (ref 80.0–100.0)
Platelets: 186 10*3/uL (ref 150–400)
RBC: 3.58 MIL/uL — ABNORMAL LOW (ref 4.22–5.81)
RDW: 17.3 % — ABNORMAL HIGH (ref 11.5–15.5)
WBC: 7.5 10*3/uL (ref 4.0–10.5)
nRBC: 0 % (ref 0.0–0.2)

## 2020-07-04 NOTE — Progress Notes (Signed)
Pulmonary Critical Care Medicine [place]   PULMONARY CRITICAL CARE SERVICE  PROGRESS NOTE  Date of Service: 07/04/2020  WARRICK LLERA  [id number]:[id number]  DOB: [birth year]   DOA: 06/28/2020  Referring Physician: Merton Border, MD  HPI: TALEN POSER is a 80 y.o. male seen for follow up of Acute on Chronic Respiratory Failure.  Patient is currently on pressure support FiO2 30% pressure 12/5 has been having some episodes of bradycardia to little bit better now medications are being adjusted  Medications: Reviewed on Rounds  Physical Exam:  Vitals: Temperature is 98.6 pulse 74 respiratory 17 blood pressure is 122/80 saturations 99%  Ventilator Settings on pressure support FiO2 30% pressure 12/5  . General: Comfortable at this time . Eyes: Grossly normal lids, irises & conjunctiva . ENT: grossly tongue is normal . Neck: no obvious mass . Cardiovascular: S1 S2 normal no gallop . Respiratory: Scattered rhonchi expansion is equal . Abdomen: soft . Skin: no rash seen on limited exam . Musculoskeletal: not rigid . Psychiatric:unable to assess . Neurologic: no seizure no involuntary movements         Lab Data:   Basic Metabolic Panel: Recent Labs  Lab 06/28/20 0934 06/29/20 0357 06/30/20 0450 07/01/20 0333 07/03/20 1001  NA 143 147* 146* 143  --   K 3.1* 3.0* 2.8* 3.2* 3.5  CL 105 107 106 104  --   CO2 28 32 28 28  --   GLUCOSE 145* 140* 136* 121*  --   BUN 42* 40* 44* 35*  --   CREATININE 1.09 1.17 1.05 1.06  --   CALCIUM 8.9 9.0 9.0 9.0  --     ABG: No results for input(s): PHART, PCO2ART, PO2ART, HCO3, O2SAT in the last 168 hours.  Liver Function Tests: No results for input(s): AST, ALT, ALKPHOS, BILITOT, PROT, ALBUMIN in the last 168 hours. No results for input(s): LIPASE, AMYLASE in the last 168 hours. No results for input(s): AMMONIA in the last 168 hours.  CBC: Recent Labs  Lab 06/28/20 0934 06/29/20 0357 07/04/20 0447  WBC 9.9  7.7 7.5  HGB 9.6* 9.1* 9.7*  HCT 33.2* 30.7* 32.2*  MCV 91.2 90.6 89.9  PLT 174 161 186    Cardiac Enzymes: No results for input(s): CKTOTAL, CKMB, CKMBINDEX, TROPONINI in the last 168 hours.  BNP (last 3 results) No results for input(s): BNP in the last 8760 hours.  ProBNP (last 3 results) No results for input(s): PROBNP in the last 8760 hours.  Radiological Exams: No results found.  Assessment/Plan Active Problems:   Acute on chronic respiratory failure with hypoxia (HCC)   Healthcare-associated pneumonia   Cardiac arrest (HCC)   Pleural effusion   Chronic atrial fibrillation ([place])   1. Acute on chronic respiratory failure hypoxia we will continue with the weaning protocol on pressure support 12/5.  Continue to follow along 2. Healthcare associated pneumonia treated we will continue to follow along. 3. Cardiac arrest rhythm is stable we will continue supportive care 4. Pleural effusion no change 5. Chronic atrial fibrillation rate is controlled at this time   I have personally seen and evaluated the patient, evaluated laboratory and imaging results, formulated the assessment and plan and placed orders. The Patient requires high complexity decision making with multiple systems involvement.  Rounds were done with the Respiratory Therapy Director and Staff therapists and discussed with nursing staff also.  [name], MD [facility] Pulmonary Critical Care Medicine Sleep Medicine

## 2020-07-05 ENCOUNTER — Other Ambulatory Visit (HOSPITAL_COMMUNITY): Payer: Medicare Other | Attending: Internal Medicine

## 2020-07-05 DIAGNOSIS — R079 Chest pain, unspecified: Secondary | ICD-10-CM

## 2020-07-05 DIAGNOSIS — J9621 Acute and chronic respiratory failure with hypoxia: Secondary | ICD-10-CM | POA: Diagnosis not present

## 2020-07-05 DIAGNOSIS — I4821 Permanent atrial fibrillation: Secondary | ICD-10-CM | POA: Diagnosis not present

## 2020-07-05 DIAGNOSIS — I5031 Acute diastolic (congestive) heart failure: Secondary | ICD-10-CM | POA: Diagnosis not present

## 2020-07-05 DIAGNOSIS — I482 Chronic atrial fibrillation, unspecified: Secondary | ICD-10-CM | POA: Diagnosis not present

## 2020-07-05 DIAGNOSIS — J9620 Acute and chronic respiratory failure, unspecified whether with hypoxia or hypercapnia: Secondary | ICD-10-CM | POA: Diagnosis not present

## 2020-07-05 DIAGNOSIS — I2510 Atherosclerotic heart disease of native coronary artery without angina pectoris: Secondary | ICD-10-CM | POA: Diagnosis not present

## 2020-07-05 DIAGNOSIS — J90 Pleural effusion, not elsewhere classified: Secondary | ICD-10-CM | POA: Diagnosis not present

## 2020-07-05 DIAGNOSIS — D62 Acute posthemorrhagic anemia: Secondary | ICD-10-CM | POA: Diagnosis not present

## 2020-07-05 DIAGNOSIS — S02651A Fracture of angle of right mandible, initial encounter for closed fracture: Secondary | ICD-10-CM | POA: Diagnosis not present

## 2020-07-05 DIAGNOSIS — I469 Cardiac arrest, cause unspecified: Secondary | ICD-10-CM | POA: Diagnosis not present

## 2020-07-05 DIAGNOSIS — J9690 Respiratory failure, unspecified, unspecified whether with hypoxia or hypercapnia: Secondary | ICD-10-CM | POA: Diagnosis not present

## 2020-07-05 LAB — ECHOCARDIOGRAM COMPLETE
Area-P 1/2: 3.72 cm2
Calc EF: 74.2 %
MV VTI: 2.03 cm2
S' Lateral: 2.5 cm
Single Plane A2C EF: 72.7 %
Single Plane A4C EF: 80.5 %

## 2020-07-05 LAB — TROPONIN I (HIGH SENSITIVITY): Troponin I (High Sensitivity): 32 ng/L — ABNORMAL HIGH (ref <18)
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
## 2020-07-05 NOTE — Progress Notes (Signed)
  Echocardiogram 2D Echocardiogram has been performed.  Bobbye Charleston 07/05/2020, 2:51 PM

## 2020-07-05 NOTE — Progress Notes (Signed)
Pulmonary Critical Care Medicine [place]   PULMONARY CRITICAL CARE SERVICE  PROGRESS NOTE  Date of Service: 07/05/2020  Daniel [name]  TZG:[id number]  DOB: 1940-05-21   DOA: 06/28/2020  Referring Physician: Merton Border, MD  HPI: Daniel [name] is a 80 y.o. male seen for follow up of Acute on Chronic Respiratory Failure.  Patient currently is on pressure support has been on 30% FiO2 right now is on a pressure support of 12/5  Medications: Reviewed on Rounds  Physical Exam:  Vitals: Temperature is 96.6 pulse 55 respiratory rate 16 blood pressure is 95/51 saturations 95%  Ventilator Settings on pressure support FiO2 30% pressure 12/5  . General: Comfortable at this time . Eyes: Grossly normal lids, irises & conjunctiva . ENT: grossly tongue is normal . Neck: no obvious mass . Cardiovascular: S1 S2 normal no gallop . Respiratory: Scattered rhonchi coarse breath sounds . Abdomen: soft . Skin: no rash seen on limited exam . Musculoskeletal: not rigid . Psychiatric:unable to assess . Neurologic: no seizure no involuntary movements         Lab Data:   Basic Metabolic Panel: Recent Labs  Lab 06/29/20 0357 06/30/20 0450 07/01/20 0333 07/03/20 1001  NA 147* 146* 143  --   K 3.0* 2.8* 3.2* 3.5  CL 107 106 104  --   CO2 32 28 28  --   GLUCOSE 140* 136* 121*  --   BUN 40* 44* 35*  --   CREATININE 1.17 1.05 1.06  --   CALCIUM 9.0 9.0 9.0  --     ABG: No results for input(s): PHART, PCO2ART, PO2ART, HCO3, O2SAT in the last 168 hours.  Liver Function Tests: No results for input(s): AST, ALT, ALKPHOS, BILITOT, PROT, ALBUMIN in the last 168 hours. No results for input(s): LIPASE, AMYLASE in the last 168 hours. No results for input(s): AMMONIA in the last 168 hours.  CBC: Recent Labs  Lab 06/29/20 0357 07/04/20 0447  WBC 7.7 7.5  HGB 9.1* 9.7*  HCT 30.7* 32.2*  MCV 90.6 89.9  PLT 161 186    Cardiac Enzymes: No results for input(s):  CKTOTAL, CKMB, CKMBINDEX, TROPONINI in the last 168 hours.  BNP (last 3 results) No results for input(s): BNP in the last 8760 hours.  ProBNP (last 3 results) No results for input(s): PROBNP in the last 8760 hours.  Radiological Exams: No results found.  Assessment/Plan Active Problems:   Acute on chronic respiratory failure with hypoxia (HCC)   Healthcare-associated pneumonia   Cardiac arrest ([facility])   Pleural effusion   Chronic atrial fibrillation ([place])   1. Acute on chronic respiratory failure hypoxia we will continue with the pressure support on 30% FiO2 we will continue with pressure support of 12/5 with a goal of 16 hours 2. Healthcare associated pneumonia treated we will continue to monitor 3. Cardiac arrest rhythm is stable 4. Chronic atrial fibrillation rate controlled 5. Pleural effusion status post drainage   I have personally seen and evaluated the patient, evaluated laboratory and imaging results, formulated the assessment and plan and placed orders. The Patient requires high complexity decision making with multiple systems involvement.  Rounds were done with the Respiratory Therapy Director and Staff therapists and discussed with nursing staff also.  [name], MD [facility] Pulmonary Critical Care Medicine Sleep Medicine

## 2020-07-06 ENCOUNTER — Other Ambulatory Visit: Payer: Self-pay | Admitting: Internal Medicine

## 2020-07-06 DIAGNOSIS — I4821 Permanent atrial fibrillation: Secondary | ICD-10-CM | POA: Diagnosis not present

## 2020-07-06 DIAGNOSIS — I2510 Atherosclerotic heart disease of native coronary artery without angina pectoris: Secondary | ICD-10-CM | POA: Diagnosis not present

## 2020-07-06 DIAGNOSIS — J9690 Respiratory failure, unspecified, unspecified whether with hypoxia or hypercapnia: Secondary | ICD-10-CM | POA: Diagnosis not present

## 2020-07-06 DIAGNOSIS — J90 Pleural effusion, not elsewhere classified: Secondary | ICD-10-CM | POA: Diagnosis not present

## 2020-07-06 DIAGNOSIS — J9621 Acute and chronic respiratory failure with hypoxia: Secondary | ICD-10-CM | POA: Diagnosis not present

## 2020-07-06 DIAGNOSIS — J9620 Acute and chronic respiratory failure, unspecified whether with hypoxia or hypercapnia: Secondary | ICD-10-CM | POA: Diagnosis not present

## 2020-07-06 DIAGNOSIS — E870 Hyperosmolality and hypernatremia: Secondary | ICD-10-CM | POA: Diagnosis not present

## 2020-07-06 DIAGNOSIS — I4891 Unspecified atrial fibrillation: Secondary | ICD-10-CM | POA: Diagnosis not present

## 2020-07-06 DIAGNOSIS — I5031 Acute diastolic (congestive) heart failure: Secondary | ICD-10-CM | POA: Diagnosis not present

## 2020-07-06 DIAGNOSIS — I469 Cardiac arrest, cause unspecified: Secondary | ICD-10-CM | POA: Diagnosis not present

## 2020-07-06 DIAGNOSIS — I482 Chronic atrial fibrillation, unspecified: Secondary | ICD-10-CM | POA: Diagnosis not present

## 2020-07-06 DIAGNOSIS — E46 Unspecified protein-calorie malnutrition: Secondary | ICD-10-CM | POA: Diagnosis not present

## 2020-07-06 NOTE — Consult Note (Signed)
Ref: Maudie Mercury, MD   Subjective:  Resting comfortably. On T Collar today.  Objective:  Vital Signs in the last 24 hours:  P: 69, R: 17, O2 sat 98 %, BP 100/50  Physical Exam: BP Readings from Last 1 Encounters:  04/15/20 (!) 135/91     Wt Readings from Last 1 Encounters:  04/15/20 109 kg    Weight change:  There is no height or weight on file to calculate BMI. HEENT: [place]/AT, Eyes-Brown, Conjunctiva-Pale, Sclera-Non-icteric Neck: No JVD, No bruit, Trachea midline. Lungs:  Clearing, Bilateral. Cardiac:  Regular rhythm, normal S1 and S2, no S3. II/VI systolic murmur. Abdomen:  Soft, non-tender. BS present. Extremities:  Trace edema present. No cyanosis. No clubbing. CNS: AxOx0, Cranial nerves grossly intact.  Skin: Warm and dry.   Intake/Output from previous day: No intake/output data recorded.    Lab Results: BMET    Component Value Date/Time   NA 143 07/01/2020 0333   NA 146 (H) 06/30/2020 0450   NA 147 (H) 06/29/2020 0357   NA 144 04/15/2020 1624   NA 144 02/12/2020 1154   NA 142 01/07/2020 1110   K 3.5 07/03/2020 1001   K 3.2 (L) 07/01/2020 0333   K 2.8 (L) 06/30/2020 0450   CL 104 07/01/2020 0333   CL 106 06/30/2020 0450   CL 107 06/29/2020 0357   CO2 28 07/01/2020 0333   CO2 28 06/30/2020 0450   CO2 32 06/29/2020 0357   GLUCOSE 121 (H) 07/01/2020 0333   GLUCOSE 136 (H) 06/30/2020 0450   GLUCOSE 140 (H) 06/29/2020 0357   BUN 35 (H) 07/01/2020 0333   BUN 44 (H) 06/30/2020 0450   BUN 40 (H) 06/29/2020 0357   BUN 16 04/15/2020 1624   BUN 18 02/12/2020 1154   BUN 17 01/07/2020 1110   CREATININE 1.06 07/01/2020 0333   CREATININE 1.05 06/30/2020 0450   CREATININE 1.17 06/29/2020 0357   CREATININE 1.47 (H) 02/25/2016 1229   CREATININE 1.52 (H) 12/04/2014 1630   CREATININE 1.46 (H) 04/29/2014 1513   CALCIUM 9.0 07/01/2020 0333   CALCIUM 9.0 06/30/2020 0450   CALCIUM 9.0 06/29/2020 0357   GFRNONAA >60 07/01/2020 0333   GFRNONAA >60 06/30/2020 0450    GFRNONAA >60 06/29/2020 0357   GFRNONAA 47 (L) 04/29/2014 1513   GFRNONAA 49 (L) 01/28/2014 1507   GFRAA 61 04/15/2020 1624   GFRAA 64 02/12/2020 1154   GFRAA >60 01/22/2020 1135   GFRAA 55 (L) 04/29/2014 1513   GFRAA 56 (L) 01/28/2014 1507   CBC    Component Value Date/Time   WBC 7.5 07/04/2020 0447   RBC 3.58 (L) 07/04/2020 0447   HGB 9.7 (L) 07/04/2020 0447   HGB 13.3 04/15/2020 1624   HCT 32.2 (L) 07/04/2020 0447   HCT 40.9 04/15/2020 1624   PLT 186 07/04/2020 0447   PLT 191 04/15/2020 1624   MCV 89.9 07/04/2020 0447   MCV 83 04/15/2020 1624   MCH 27.1 07/04/2020 0447   MCHC 30.1 07/04/2020 0447   RDW 17.3 (H) 07/04/2020 0447   RDW 13.8 04/15/2020 1624   LYMPHSABS 2.4 06/14/2020 1507   LYMPHSABS 1.9 04/15/2020 1624   MONOABS 0.8 06/14/2020 1507   EOSABS 0.3 06/14/2020 1507   EOSABS 0.2 04/15/2020 1624   BASOSABS 0.0 06/14/2020 1507   BASOSABS 0.0 04/15/2020 1624   HEPATIC Function Panel Recent Labs    04/15/20 1624 05/24/20 2028 06/14/20 1507  PROT 8.0 8.1 6.9   HEMOGLOBIN A1C No components found for:  HGA1C,  MPG CARDIAC ENZYMES No results found for: CKTOTAL, CKMB, CKMBINDEX, TROPONINI BNP No results for input(s): PROBNP in the last 8760 hours. TSH Recent Labs    12/05/19 1149 01/02/20 1644  TSH 4.900* 4.240   CHOLESTEROL Recent Labs    12/05/19 1149  CHOL 108    Scheduled Meds: Continuous Infusions: PRN Meds:.  Assessment/Plan: Acute on chronic respiratory failure with hypoxia Acute diastolic left heart failure Abnormal troponin I from demand ischemia CAD Healthcare associated pneumonia Chronic atrial fibrillation Ascending aortic aneurysm  HCM without obstruction  Continue medical therapy.   LOS: 0 days   Time spent including chart review, lab review, examination, discussion with patient/Nurse :  25 min   Dixie Dials  MD  07/06/2020, 2:45 PM

## 2020-07-06 NOTE — Progress Notes (Signed)
Pulmonary Critical Care Medicine [place]   PULMONARY CRITICAL CARE SERVICE  PROGRESS NOTE  Date of Service: 07/06/2020  Daniel [name]  VVZ:[id number]  DOB: 1941/03/15   DOA: 06/28/2020  Referring Physician: Merton Border, MD  HPI: MERCEDES [name] is a 80 y.o. male seen for follow up of Acute on Chronic Respiratory Failure.  Currently is on pressure support did 16 hours yesterday should be able to advance to T collar  Medications: Reviewed on Rounds  Physical Exam:  Vitals: Temperature 95.6 pulse 82 respiratory rate 16 blood pressure is 103/54 saturations 99%  Ventilator Settings on pressure support 12/5  . General: Comfortable at this time . Eyes: Grossly normal lids, irises & conjunctiva . ENT: grossly tongue is normal . Neck: no obvious mass . Cardiovascular: S1 S2 normal no gallop . Respiratory: No rhonchi no rales noted . Abdomen: soft . Skin: no rash seen on limited exam . Musculoskeletal: not rigid . Psychiatric:unable to assess . Neurologic: no seizure no involuntary movements         Lab Data:   Basic Metabolic Panel: Recent Labs  Lab 06/30/20 0450 07/01/20 0333 07/03/20 1001  NA 146* 143  --   K 2.8* 3.2* 3.5  CL 106 104  --   CO2 28 28  --   GLUCOSE 136* 121*  --   BUN 44* 35*  --   CREATININE 1.05 1.06  --   CALCIUM 9.0 9.0  --     ABG: No results for input(s): PHART, PCO2ART, PO2ART, HCO3, O2SAT in the last 168 hours.  Liver Function Tests: No results for input(s): AST, ALT, ALKPHOS, BILITOT, PROT, ALBUMIN in the last 168 hours. No results for input(s): LIPASE, AMYLASE in the last 168 hours. No results for input(s): AMMONIA in the last 168 hours.  CBC: Recent Labs  Lab 07/04/20 0447  WBC 7.5  HGB 9.7*  HCT 32.2*  MCV 89.9  PLT 186    Cardiac Enzymes: No results for input(s): CKTOTAL, CKMB, CKMBINDEX, TROPONINI in the last 168 hours.  BNP (last 3 results) No results for input(s): BNP in the last 8760  hours.  ProBNP (last 3 results) No results for input(s): PROBNP in the last 8760 hours.  Radiological Exams: ECHOCARDIOGRAM COMPLETE  Result Date: 07/05/2020    ECHOCARDIOGRAM REPORT   Patient Name:   Daniel [name] Date of Exam: 07/05/2020 Medical Rec #:  [id number]        Height:       70.0 in Accession #:    [id number]       Weight:       256.2 lb Date of Birth:  06-Feb-1941         BSA:          2.319 m Patient Age:    58 years         BP:           100/57 mmHg Patient Gender: M                HR:           86 bpm. Exam Location:  Inpatient Procedure: 2D Echo, Cardiac Doppler and Color Doppler Indications:    R07.9* Chest pain, unspecified  History:        Patient has no prior history of Echocardiogram examinations.                 Abnormal ECG, Arrythmias:Cardiac Arrest and Atrial Fibrillation,  Signs/Symptoms:Shortness of Breath and Dyspnea; Risk                 Factors:Hypertension. Hypoxia. Pleural effusion.  Sonographer:    Roseanna Rainbow RDCS Referring Phys: [phone] [name]  Sonographer Comments: Technically difficult study due to poor echo windows and echo performed with patient supine and on artificial respirator. Image acquisition challenging due to patient body habitus. Could not turn patient, trach collar. IMPRESSIONS  1. Left ventricular ejection fraction, by estimation, is 70 to 75%. The left ventricle has hyperdynamic function. The left ventricle has no regional wall motion abnormalities. There is moderate left ventricular hypertrophy. Left ventricular diastolic parameters are indeterminate.  2. Right ventricular systolic function is normal. The right ventricular size is normal. There is normal pulmonary artery systolic pressure.  3. Left atrial size was mildly dilated.  4. The mitral valve is abnormal. Trivial mitral valve regurgitation.  5. The aortic valve is abnormal. Aortic valve regurgitation is not visualized. Mild to moderate aortic valve sclerosis/calcification is present,  without any evidence of aortic stenosis.  6. Aortic dilatation noted. 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  7. The inferior vena cava is dilated in size with <50% respiratory variability, suggesting right atrial pressure of 15 mmHg. FINDINGS  Left Ventricle: Left ventricular ejection fraction, by estimation, is 70 to 75%. The left ventricle has hyperdynamic function. The left ventricle has no regional wall motion abnormalities. The left ventricular internal cavity size was normal in size. There is moderate left ventricular hypertrophy. Left ventricular diastolic parameters are indeterminate. Right Ventricle: The right ventricular size is normal. Right vetricular wall thickness was not assessed. Right ventricular systolic function is normal. There is normal pulmonary artery systolic pressure. The tricuspid regurgitant velocity is 2.44 m/s, and with an assumed right atrial pressure of 3 mmHg, the estimated right ventricular systolic pressure is 67.1 mmHg. Left Atrium: Left atrial size was mildly dilated. Right Atrium: Right atrial size was normal in size. Pericardium: There is no evidence of pericardial effusion. Mitral Valve: The mitral valve is abnormal. Mild mitral annular calcification. Trivial mitral valve regurgitation. MV peak gradient, 7.3 mmHg. The mean mitral valve gradient is 2.0 mmHg. Tricuspid Valve: The tricuspid valve is normal in structure. Tricuspid valve regurgitation is trivial. Aortic Valve: The aortic valve is abnormal. Aortic valve regurgitation is not visualized. Mild to moderate aortic valve sclerosis/calcification is present, without any evidence of aortic stenosis. Pulmonic Valve: The pulmonic valve was not well visualized. Pulmonic valve regurgitation is not visualized. Aorta: The aortic root is normal in size and structure and aortic dilatation noted. 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 Venous: The inferior  vena cava is dilated in size with less than 50% respiratory variability, suggesting right atrial pressure of 15 mmHg. IAS/Shunts: The interatrial septum was not assessed.  LEFT VENTRICLE PLAX 2D LVIDd:         4.10 cm LVIDs:         2.50 cm LV PW:         1.70 cm LV IVS:        1.60 cm LVOT diam:     2.30 cm LV SV:         78 LV SV Index:   34 LVOT Area:     4.15 cm  LV Volumes (MOD) LV vol d, MOD A2C: 63.7 ml LV vol d, MOD A4C: 72.2 ml LV vol s, MOD A2C: 17.4 ml  LV vol s, MOD A4C: 14.1 ml LV SV MOD A2C:     46.3 ml LV SV MOD A4C:     72.2 ml LV SV MOD BP:      50.5 ml RIGHT VENTRICLE             IVC RV S prime:     10.20 cm/s  IVC diam: 2.80 cm TAPSE (M-mode): 1.2 cm LEFT ATRIUM           Index       RIGHT ATRIUM           Index LA diam:      4.55 cm 1.96 cm/m  RA Area:     20.20 cm LA Vol (A2C): 37.9 ml 16.34 ml/m RA Volume:   54.30 ml  23.42 ml/m LA Vol (A4C): 59.4 ml 25.62 ml/m  AORTIC VALVE LVOT Vmax:   106.00 cm/s LVOT Vmean:  67.100 cm/s LVOT VTI:    0.188 m  AORTA Ao Root diam: 3.20 cm Ao Asc diam:  4.30 cm MITRAL VALVE                TRICUSPID VALVE MV Area (PHT): 3.72 cm     TV Peak grad:   22.3 mmHg MV Area VTI:   2.03 cm     TV Vmax:        2.36 m/s MV Peak grad:  7.3 mmHg     TR Peak grad:   23.8 mmHg MV Mean grad:  2.0 mmHg     TR Vmax:        244.00 cm/s MV Vmax:       1.35 m/s MV Vmean:      53.0 cm/s    SHUNTS MV Decel Time: 204 msec     Systemic VTI:  0.19 m MV E velocity: 138.00 cm/s  Systemic Diam: 2.30 cm Dorris Carnes MD Electronically signed by Dorris Carnes MD Signature Date/Time: 07/05/2020/5:38:25 PM    Final     Assessment/Plan Active Problems:   Acute on chronic respiratory failure with hypoxia (HCC)   Healthcare-associated pneumonia   Cardiac arrest (HCC)   Pleural effusion   Chronic atrial fibrillation ([place])   1. Acute on chronic respiratory failure hypoxia we will continue to wean on protocol with goal with T collar today up to 4 hours 2. Cardiac arrest rhythm  stable 3. Pleural effusion supportive care 4. Healthcare associated pneumonia treated improving 5. Chronic atrial fibrillation being seen by cardiology   I have personally seen and evaluated the patient, evaluated laboratory and imaging results, formulated the assessment and plan and placed orders. The Patient requires high complexity decision making with multiple systems involvement.  Rounds were done with the Respiratory Therapy Director and Staff therapists and discussed with nursing staff also.  [name], MD [facility] Pulmonary Critical Care Medicine Sleep Medicine

## 2020-07-07 ENCOUNTER — Encounter (HOSPITAL_COMMUNITY): Payer: Self-pay | Admitting: Anesthesiology

## 2020-07-07 ENCOUNTER — Encounter (HOSPITAL_COMMUNITY)
Admission: AD | Disposition: A | Discharge status: Skilled Nursing Facility | Payer: Self-pay | Source: Other Acute Inpatient Hospital | Attending: Internal Medicine

## 2020-07-07 ENCOUNTER — Other Ambulatory Visit: Payer: Self-pay | Admitting: Internal Medicine

## 2020-07-07 DIAGNOSIS — I469 Cardiac arrest, cause unspecified: Secondary | ICD-10-CM | POA: Diagnosis not present

## 2020-07-07 DIAGNOSIS — S0269XD Fracture of mandible of other specified site, subsequent encounter for fracture with routine healing: Secondary | ICD-10-CM | POA: Diagnosis not present

## 2020-07-07 DIAGNOSIS — D62 Acute posthemorrhagic anemia: Secondary | ICD-10-CM | POA: Diagnosis not present

## 2020-07-07 DIAGNOSIS — J9690 Respiratory failure, unspecified, unspecified whether with hypoxia or hypercapnia: Secondary | ICD-10-CM | POA: Diagnosis not present

## 2020-07-07 DIAGNOSIS — E870 Hyperosmolality and hypernatremia: Secondary | ICD-10-CM | POA: Diagnosis not present

## 2020-07-07 DIAGNOSIS — J90 Pleural effusion, not elsewhere classified: Secondary | ICD-10-CM | POA: Diagnosis not present

## 2020-07-07 DIAGNOSIS — J9621 Acute and chronic respiratory failure with hypoxia: Secondary | ICD-10-CM | POA: Diagnosis not present

## 2020-07-07 DIAGNOSIS — I482 Chronic atrial fibrillation, unspecified: Secondary | ICD-10-CM | POA: Diagnosis not present

## 2020-07-07 HISTORY — PX: MANDIBULAR HARDWARE REMOVAL: SHX5205

## 2020-07-07 LAB — CBC
HCT: 31.6 % — ABNORMAL LOW (ref 39.0–52.0)
Hemoglobin: 9.3 g/dL — ABNORMAL LOW (ref 13.0–17.0)
MCH: 26.5 pg (ref 26.0–34.0)
MCHC: 29.4 g/dL — ABNORMAL LOW (ref 30.0–36.0)
MCV: 90 fL (ref 80.0–100.0)
Platelets: 195 10*3/uL (ref 150–400)
RBC: 3.51 MIL/uL — ABNORMAL LOW (ref 4.22–5.81)
RDW: 17.2 % — ABNORMAL HIGH (ref 11.5–15.5)
WBC: 6.8 10*3/uL (ref 4.0–10.5)
nRBC: 0 % (ref 0.0–0.2)

## 2020-07-07 LAB — BASIC METABOLIC PANEL
Anion gap: 11 (ref 5–15)
BUN: 56 mg/dL — ABNORMAL HIGH (ref 8–23)
CO2: 24 mmol/L (ref 22–32)
Calcium: 9.3 mg/dL (ref 8.9–10.3)
Chloride: 105 mmol/L (ref 98–111)
Creatinine, Ser: 1.12 mg/dL (ref 0.61–1.24)
GFR, Estimated: >60 mL/min (ref >60)
Glucose, Bld: 100 mg/dL — ABNORMAL HIGH (ref 70–99)
Potassium: 3.5 mmol/L (ref 3.5–5.1)
Sodium: 140 mmol/L (ref 135–145)

## 2020-07-07 SURGERY — REMOVAL, HARDWARE, MANDIBLE
Anesthesia: General | Site: Mouth

## 2020-07-07 MED ORDER — LIDOCAINE-EPINEPHRINE 1 %-1:100000 IJ SOLN
INTRAMUSCULAR | Status: AC
  Filled 2020-07-07: qty 1

## 2020-07-07 MED ORDER — EPHEDRINE SULFATE-NACL 50-0.9 MG/10ML-% IV SOSY
PREFILLED_SYRINGE | INTRAVENOUS | Status: DC | PRN
  Administered 2020-07-07: 80 mg via INTRAVENOUS
  Administered 2020-07-07: 10 mg via INTRAVENOUS

## 2020-07-07 MED ORDER — PHENYLEPHRINE 40 MCG/ML (10ML) SYRINGE FOR IV PUSH (FOR BLOOD PRESSURE SUPPORT)
PREFILLED_SYRINGE | INTRAVENOUS | Status: DC | PRN
  Administered 2020-07-07 (×2): 80 ug via INTRAVENOUS

## 2020-07-07 MED ORDER — LIDOCAINE-EPINEPHRINE 1 %-1:100000 IJ SOLN
INTRAMUSCULAR | Status: DC | PRN
  Administered 2020-07-07: 20 mL

## 2020-07-07 MED ORDER — 0.9 % SODIUM CHLORIDE (POUR BTL) OPTIME
TOPICAL | Status: DC | PRN
  Administered 2020-07-07: 1000 mL

## 2020-07-07 MED ORDER — LACTATED RINGERS IV SOLN: INTRAVENOUS | Status: DC | PRN

## 2020-07-07 MED ORDER — ONDANSETRON HCL 4 MG/2ML IJ SOLN
INTRAMUSCULAR | Status: DC | PRN
  Administered 2020-07-07: 4 mg via INTRAVENOUS

## 2020-07-07 SURGICAL SUPPLY — 34 items
BLADE SURG 15 STRL LF DISP TIS (BLADE) IMPLANT
BLADE SURG 15 STRL SS (BLADE) ×3
CANISTER SUCT 3000ML PPV (MISCELLANEOUS) ×3 IMPLANT
CORD BIPOLAR FORCEPS 12FT (ELECTRODE) ×2 IMPLANT
COVER LIGHT HANDLE UNIVERSAL (MISCELLANEOUS) ×2 IMPLANT
COVER SURGICAL LIGHT HANDLE (MISCELLANEOUS) ×1 IMPLANT
COVER WAND RF STERILE (DRAPES) ×1 IMPLANT
DECANTER SPIKE VIAL GLASS SM (MISCELLANEOUS) ×3 IMPLANT
DRAPE HALF SHEET 40X57 (DRAPES) ×3 IMPLANT
ELECT NDL BLADE 2-5/6 (NEEDLE) IMPLANT
ELECT NEEDLE BLADE 2-5/6 (NEEDLE) ×3 IMPLANT
ELECT PENCIL ROCKER SW 15FT (MISCELLANEOUS) ×2 IMPLANT
GAUZE 4X4 16PLY RFD (DISPOSABLE) IMPLANT
GLOVE BIOGEL M 7.0 STRL (GLOVE) ×5 IMPLANT
GOWN STRL REUS W/ TWL LRG LVL3 (GOWN DISPOSABLE) ×2 IMPLANT
GOWN STRL REUS W/TWL LRG LVL3 (GOWN DISPOSABLE) ×6
KIT BASIN OR (CUSTOM PROCEDURE TRAY) ×3 IMPLANT
KIT TURNOVER KIT B (KITS) ×3 IMPLANT
NDL HYPO 25GX1X1/2 BEV (NEEDLE) IMPLANT
NDL PRECISIONGLIDE 27X1.5 (NEEDLE) ×1 IMPLANT
NEEDLE HYPO 25GX1X1/2 BEV (NEEDLE) IMPLANT
NEEDLE PRECISIONGLIDE 27X1.5 (NEEDLE) ×3 IMPLANT
NS IRRIG 1000ML POUR BTL (IV SOLUTION) ×3 IMPLANT
PAD ARMBOARD 7.5X6 YLW CONV (MISCELLANEOUS) ×6 IMPLANT
SUCTION FRAZIER HANDLE 10FR (MISCELLANEOUS) ×3
SUCTION TUBE FRAZIER 10FR DISP (MISCELLANEOUS) IMPLANT
SUT CHROMIC 3 0 PS 2 (SUTURE) ×3 IMPLANT
SUT CHROMIC 4 0 PS 2 18 (SUTURE) ×3 IMPLANT
SYR CONTROL 10ML LL (SYRINGE) ×3 IMPLANT
TRAY ENT MC OR (CUSTOM PROCEDURE TRAY) ×3 IMPLANT
TUBE CONNECTING 12'X1/4 (SUCTIONS) ×1
TUBE CONNECTING 12X1/4 (SUCTIONS) ×2 IMPLANT
WATER STERILE IRR 1000ML POUR (IV SOLUTION) ×3 IMPLANT
YANKAUER SUCT BULB TIP NO VENT (SUCTIONS) ×3 IMPLANT
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
## 2020-07-07 NOTE — Anesthesia Preprocedure Evaluation (Addendum)
Anesthesia Evaluation  Patient identified by MRN, date of birth, ID band Patient awake and Patient unresponsive    Reviewed: Allergy & Precautions, NPO status , Patient's Chart, lab work & pertinent test results, reviewed documented beta blocker date and time   Airway Mallampati: Trach       Dental  (+) Teeth Intact, Dental Advisory Given   Pulmonary former smoker,  Trach, on ventilator     + decreased breath sounds  + intubated    Cardiovascular hypertension, Pt. on medications and Pt. on home beta blockers + Peripheral Vascular Disease  + dysrhythmias Atrial Fibrillation  Rhythm:Irregular Rate:Normal     Neuro/Psych negative neurological ROS  negative psych ROS   GI/Hepatic negative GI ROS, Neg liver ROS,   Endo/Other  Obesity BMI 37  Renal/GU Renal InsufficiencyRenal disease  negative genitourinary   Musculoskeletal  (+) Arthritis , Osteoarthritis,    Abdominal   Peds  Hematology  (+) Blood dyscrasia, anemia ,   Anesthesia Other Findings   Reproductive/Obstetrics negative OB ROS                            Anesthesia Physical Anesthesia Plan  ASA: IV  Anesthesia Plan: General   Post-op Pain Management:    Induction: Inhalational  PONV Risk Score and Plan: 2 and Treatment may vary due to age or medical condition  Airway Management Planned: Tracheostomy  Additional Equipment:   Intra-op Plan:   Post-operative Plan: Post-operative intubation/ventilation  Informed Consent: I have reviewed the patients History and Physical, chart, labs and discussed the procedure including the risks, benefits and alternatives for the proposed anesthesia with the patient or authorized representative who has indicated his/her understanding and acceptance.     Dental advisory given and Consent reviewed with POA  Plan Discussed with: CRNA  Anesthesia Plan Comments: (Consent over phone with  patient's daughter. Pre-op completed after induction due to patient being a straight back to OR due to ventilator status.)        Anesthesia Quick Evaluation

## 2020-07-07 NOTE — Anesthesia Postprocedure Evaluation (Signed)
Anesthesia Post Note  Patient: Daniel [name]  Procedure(s) Performed: MANDIBULAR HARDWARE REMOVAL (N/A Mouth)     Patient location during evaluation: PACU Anesthesia Type: General Level of consciousness: patient remains intubated per anesthesia plan Pain management: pain level controlled Vital Signs Assessment: post-procedure vital signs reviewed and stable Respiratory status: spontaneous breathing, nonlabored ventilation, respiratory function stable, patient connected to tracheostomy mask oxygen and patient remains intubated per anesthesia plan Cardiovascular status: blood pressure returned to baseline and stable Postop Assessment: no apparent nausea or vomiting Anesthetic complications: no   No complications documented.  Last Vitals:  Vitals:   07/07/20 1229  BP: (!) 139/91  SpO2: 95%    Last Pain: There were no vitals filed for this visit.               Catalina Gravel

## 2020-07-07 NOTE — H&P (Signed)
ROS  This note was made in error. Please disregard.

## 2020-07-07 NOTE — Op Note (Signed)
OPERATIVE NOTE  Daniel [name] Date/Time of Admission: 06/28/2020  6:54 PM  CSN: [id number];JXB:[id number] Attending Provider: Merton Border, MD Room/Bed: MCPO/NONE DOB: Sep 29, 1940 Age: 80 y.o.   Pre-Op Diagnosis: Open fracture of right side of mandibular body  Post-Op Diagnosis: Open fracture of right side of mandibular body  Procedure: Procedure(s): MANDIBULAR HARDWARE REMOVAL  Anesthesia: Monitor Anesthesia Care  Surgeon(s): Jason Coop, DO  Staff: Circulator: Antonietta Jewel, RN Scrub Person: Buddy Duty, CST Circulator Assistant: Lorrin Mais, RN; Presson, Hazle Quant, RN  Implants: Implant Name Type Inv. Item Serial No. Manufacturer Lot No. LRB No. Used Action  LOG [id number] - [employer] CMF SET - 1            Specimens: * No specimens in log *  Complications: None  EBL: <5ML  IVF: See anesthesia log  Condition: stable  Operative Findings:  Well healed scar over left mandibular body, no palpable edema, mandibular contour stable, no obvious step offs. Mild trismus noted. Several missing teeth; however, good occlusion at molars. Arch bars secured with 4 screws each.  Description of Operation: After being properly identified in the preoperative holding area, the patient was brought into the operating suite. He was placed supine on the operating table. A pre-procedural time-out was performed. After induction of general inhalationalanesthesia, thepatient was prepped and draped in the usual fashion for this procedure.   The patient's mouth was brushed and irrigated withperidex.Attention was turned to the maxillary arch bar, and a screw driver was used to remove each screw. The maxillary dentition was carefully inspected following removal of the arch bar, and all teeth were intact with no appreciated mobility. Attention was then turned to the mandibular arch bar, and a screw driver was used to remove each screw. The maxillary dentition was  carefully inspected following removal of the arch bar, and all teeth were intact with no appreciated mobility. The labiogingival sulcuswas injected with 1% Lidocaine with 1:100,00 Epinephrine and the oral cavity was copiously irrigated.An orogastric tube was placed to suction out thepatient's pharynx and esophagusto reduce postoperative nausea.  With the surgical portion of the procedure complete, all instrumentation was then removed from the operative field. The patient's skin was cleaned. The patient was returned to the care of the anesthesia team, weaned from anesthetic and transported back to his room in stable condition.   Jason Coop, [facility]  07/07/2020

## 2020-07-07 NOTE — Transfer of Care (Signed)
Immediate Anesthesia Transfer of Care Note  Patient: Daniel [name]  Procedure(s) Performed: MANDIBULAR HARDWARE REMOVAL (N/A Mouth)  Patient Location: [facility] [phone]  Anesthesia Type:General  Level of Consciousness: Patient remains intubated per anesthesia plan  Airway & Oxygen Therapy: pt with trach, placed back on ventilator  Post-op Assessment: Report given to RN and Post -op Vital signs reviewed and stable  Post vital signs: Reviewed and stable  Last Vitals:  Vitals Value Taken Time  BP 125/68 07/07/20 13:30  Temp    Pulse 60 07/07/20 13:30  Resp 20 07/07/20 13:30  SpO2 97 07/07/20 13:30    Last Pain: There were no vitals filed for this visit.       Complications: No complications documented.

## 2020-07-07 NOTE — Progress Notes (Signed)
Pulmonary Critical Care Medicine [place]   PULMONARY CRITICAL CARE SERVICE  PROGRESS NOTE  Date of Service: 07/07/2020  Daniel [name]  YQI:[id number]  DOB: 1940/11/18   DOA: 06/28/2020  Referring Physician: Merton Border, MD  HPI: Daniel [name] is a 80 y.o. male seen for follow up of Acute on Chronic Respiratory Failure.  Patient is on assist control currently on 28% FiO2 seems like doing better  Medications: Reviewed on Rounds  Physical Exam:  Vitals: Temperature is 97.4 pulse 73 respiratory 16 blood pressure is 115/68 saturations 96%  Ventilator Settings on assist control FiO2 is 28% tidal volume 580 PEEP 5   General: Comfortable at this time  Eyes: Grossly normal lids, irises & conjunctiva  ENT: grossly tongue is normal  Neck: no obvious mass  Cardiovascular: S1 S2 normal no gallop  Respiratory: Scattered rhonchi expansion is equal at this time  Abdomen: soft  Skin: no rash seen on limited exam  Musculoskeletal: not rigid  Psychiatric:unable to assess  Neurologic: no seizure no involuntary movements         Lab Data:   Basic Metabolic Panel: Recent Labs  Lab 07/01/20 0333 07/03/20 1001 07/07/20 0439  NA 143  --  140  K 3.2* 3.5 3.5  CL 104  --  105  CO2 28  --  24  GLUCOSE 121*  --  100*  BUN 35*  --  56*  CREATININE 1.06  --  1.12  CALCIUM 9.0  --  9.3    ABG: No results for input(s): PHART, PCO2ART, PO2ART, HCO3, O2SAT in the last 168 hours.  Liver Function Tests: No results for input(s): AST, ALT, ALKPHOS, BILITOT, PROT, ALBUMIN in the last 168 hours. No results for input(s): LIPASE, AMYLASE in the last 168 hours. No results for input(s): AMMONIA in the last 168 hours.  CBC: Recent Labs  Lab 07/04/20 0447 07/07/20 0439  WBC 7.5 6.8  HGB 9.7* 9.3*  HCT 32.2* 31.6*  MCV 89.9 90.0  PLT 186 195    Cardiac Enzymes: No results for input(s): CKTOTAL, CKMB, CKMBINDEX, TROPONINI in the last 168 hours.  BNP  (last 3 results) No results for input(s): BNP in the last 8760 hours.  ProBNP (last 3 results) No results for input(s): PROBNP in the last 8760 hours.  Radiological Exams: ECHOCARDIOGRAM COMPLETE  Result Date: 07/05/2020    ECHOCARDIOGRAM REPORT   Patient Name:   Daniel [name] Date of Exam: 07/05/2020 Medical Rec #:  [id number]        Height:       70.0 in Accession #:    [id number]       Weight:       256.2 lb Date of Birth:  1940-08-04         BSA:          2.319 m Patient Age:    70 years         BP:           100/57 mmHg Patient Gender: M                HR:           86 bpm. Exam Location:  Inpatient Procedure: 2D Echo, Cardiac Doppler and Color Doppler Indications:    R07.9* Chest pain, unspecified  History:        Patient has no prior history of Echocardiogram examinations.  Abnormal ECG, Arrythmias:Cardiac Arrest and Atrial Fibrillation,                 Signs/Symptoms:Shortness of Breath and Dyspnea; Risk                 Factors:Hypertension. Hypoxia. Pleural effusion.  Sonographer:    Roseanna Rainbow RDCS Referring Phys: [phone] [name]  Sonographer Comments: Technically difficult study due to poor echo windows and echo performed with patient supine and on artificial respirator. Image acquisition challenging due to patient body habitus. Could not turn patient, trach collar. IMPRESSIONS  1. Left ventricular ejection fraction, by estimation, is 70 to 75%. The left ventricle has hyperdynamic function. The left ventricle has no regional wall motion abnormalities. There is moderate left ventricular hypertrophy. Left ventricular diastolic parameters are indeterminate.  2. Right ventricular systolic function is normal. The right ventricular size is normal. There is normal pulmonary artery systolic pressure.  3. Left atrial size was mildly dilated.  4. The mitral valve is abnormal. Trivial mitral valve regurgitation.  5. The aortic valve is abnormal. Aortic valve regurgitation is not visualized.  Mild to moderate aortic valve sclerosis/calcification is present, without any evidence of aortic stenosis.  6. Aortic dilatation noted. 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  7. The inferior vena cava is dilated in size with <50% respiratory variability, suggesting right atrial pressure of 15 mmHg. FINDINGS  Left Ventricle: Left ventricular ejection fraction, by estimation, is 70 to 75%. The left ventricle has hyperdynamic function. The left ventricle has no regional wall motion abnormalities. The left ventricular internal cavity size was normal in size. There is moderate left ventricular hypertrophy. Left ventricular diastolic parameters are indeterminate. Right Ventricle: The right ventricular size is normal. Right vetricular wall thickness was not assessed. Right ventricular systolic function is normal. There is normal pulmonary artery systolic pressure. The tricuspid regurgitant velocity is 2.44 m/s, and with an assumed right atrial pressure of 3 mmHg, the estimated right ventricular systolic pressure is 31.5 mmHg. Left Atrium: Left atrial size was mildly dilated. Right Atrium: Right atrial size was normal in size. Pericardium: There is no evidence of pericardial effusion. Mitral Valve: The mitral valve is abnormal. Mild mitral annular calcification. Trivial mitral valve regurgitation. MV peak gradient, 7.3 mmHg. The mean mitral valve gradient is 2.0 mmHg. Tricuspid Valve: The tricuspid valve is normal in structure. Tricuspid valve regurgitation is trivial. Aortic Valve: The aortic valve is abnormal. Aortic valve regurgitation is not visualized. Mild to moderate aortic valve sclerosis/calcification is present, without any evidence of aortic stenosis. Pulmonic Valve: The pulmonic valve was not well visualized. Pulmonic valve regurgitation is not visualized. Aorta: The aortic root is normal in size and structure and aortic dilatation noted. There is mild to moderate  dilatation of the ascending aorta, measuring 43 mm. Venous: The inferior vena cava is dilated in size with less than 50% respiratory variability, suggesting right atrial pressure of 15 mmHg. IAS/Shunts: The interatrial septum was not assessed.  LEFT VENTRICLE PLAX 2D LVIDd:         4.10 cm LVIDs:         2.50 cm LV PW:         1.70 cm LV IVS:        1.60 cm LVOT diam:     2.30 cm LV SV:         78 LV SV Index:   34 LVOT Area:     4.15 cm  LV  Volumes (MOD) LV vol d, MOD A2C: 63.7 ml LV vol d, MOD A4C: 72.2 ml LV vol s, MOD A2C: 17.4 ml LV vol s, MOD A4C: 14.1 ml LV SV MOD A2C:     46.3 ml LV SV MOD A4C:     72.2 ml LV SV MOD BP:      50.5 ml RIGHT VENTRICLE             IVC RV S prime:     10.20 cm/s  IVC diam: 2.80 cm TAPSE (M-mode): 1.2 cm LEFT ATRIUM           Index       RIGHT ATRIUM           Index LA diam:      4.55 cm 1.96 cm/m  RA Area:     20.20 cm LA Vol (A2C): 37.9 ml 16.34 ml/m RA Volume:   54.30 ml  23.42 ml/m LA Vol (A4C): 59.4 ml 25.62 ml/m  AORTIC VALVE LVOT Vmax:   106.00 cm/s LVOT Vmean:  67.100 cm/s LVOT VTI:    0.188 m  AORTA Ao Root diam: 3.20 cm Ao Asc diam:  4.30 cm MITRAL VALVE                TRICUSPID VALVE MV Area (PHT): 3.72 cm     TV Peak grad:   22.3 mmHg MV Area VTI:   2.03 cm     TV Vmax:        2.36 m/s MV Peak grad:  7.3 mmHg     TR Peak grad:   23.8 mmHg MV Mean grad:  2.0 mmHg     TR Vmax:        244.00 cm/s MV Vmax:       1.35 m/s MV Vmean:      53.0 cm/s    SHUNTS MV Decel Time: 204 msec     Systemic VTI:  0.19 m MV E velocity: 138.00 cm/s  Systemic Diam: 2.30 cm Dorris Carnes MD Electronically signed by Dorris Carnes MD Signature Date/Time: 07/05/2020/5:38:25 PM    Final     Assessment/Plan Active Problems:   Acute on chronic respiratory failure with hypoxia (HCC)   Healthcare-associated pneumonia   Cardiac arrest (HCC)   Pleural effusion   Chronic atrial fibrillation ([place])   1. Acute on chronic respiratory failure with hypoxia we will continue with assist control  titrate oxygen continue pulmonary toilet. 2. Healthcare associated pneumonia treated we will continue to follow-up on x-rays 3. Cardiac arrest rhythm stable cardiology following 4. Chronic atrial fibrillation rate controlled 5. Pleural effusion follow x-rays   I have personally seen and evaluated the patient, evaluated laboratory and imaging results, formulated the assessment and plan and placed orders. The Patient requires high complexity decision making with multiple systems involvement.  Rounds were done with the Respiratory Therapy Director and Staff therapists and discussed with nursing staff also.  [name], MD [facility] Pulmonary Critical Care Medicine Sleep Medicine

## 2020-07-08 ENCOUNTER — Other Ambulatory Visit: Payer: Self-pay | Admitting: Internal Medicine

## 2020-07-08 ENCOUNTER — Inpatient Hospital Stay
Admission: AD | Admit: 2020-07-08 | Discharge: 2020-09-06 | Disposition: A | Discharge status: Other Acute Inpatient Hospital | Payer: Medicare Other | Attending: Internal Medicine | Admitting: Internal Medicine

## 2020-07-08 ENCOUNTER — Ambulatory Visit: Admission: RE | Admit: 2020-07-08 | Payer: Medicare Other | Source: Home / Self Care | Admitting: Otolaryngology

## 2020-07-08 ENCOUNTER — Encounter (HOSPITAL_COMMUNITY): Payer: Self-pay | Admitting: Otolaryngology

## 2020-07-08 DIAGNOSIS — Z789 Other specified health status: Secondary | ICD-10-CM

## 2020-07-08 DIAGNOSIS — J9690 Respiratory failure, unspecified, unspecified whether with hypoxia or hypercapnia: Secondary | ICD-10-CM | POA: Diagnosis not present

## 2020-07-08 DIAGNOSIS — D62 Acute posthemorrhagic anemia: Secondary | ICD-10-CM | POA: Diagnosis not present

## 2020-07-08 DIAGNOSIS — I501 Left ventricular failure: Secondary | ICD-10-CM

## 2020-07-08 DIAGNOSIS — J811 Chronic pulmonary edema: Secondary | ICD-10-CM

## 2020-07-08 DIAGNOSIS — Z4659 Encounter for fitting and adjustment of other gastrointestinal appliance and device: Secondary | ICD-10-CM

## 2020-07-08 DIAGNOSIS — N179 Acute kidney failure, unspecified: Secondary | ICD-10-CM

## 2020-07-08 DIAGNOSIS — J189 Pneumonia, unspecified organism: Secondary | ICD-10-CM

## 2020-07-08 DIAGNOSIS — I482 Chronic atrial fibrillation, unspecified: Secondary | ICD-10-CM | POA: Diagnosis not present

## 2020-07-08 DIAGNOSIS — I469 Cardiac arrest, cause unspecified: Secondary | ICD-10-CM | POA: Diagnosis present

## 2020-07-08 DIAGNOSIS — D72829 Elevated white blood cell count, unspecified: Secondary | ICD-10-CM

## 2020-07-08 DIAGNOSIS — R140 Abdominal distension (gaseous): Secondary | ICD-10-CM

## 2020-07-08 DIAGNOSIS — E870 Hyperosmolality and hypernatremia: Secondary | ICD-10-CM | POA: Diagnosis not present

## 2020-07-08 DIAGNOSIS — J9621 Acute and chronic respiratory failure with hypoxia: Secondary | ICD-10-CM | POA: Diagnosis present

## 2020-07-08 DIAGNOSIS — R6889 Other general symptoms and signs: Secondary | ICD-10-CM

## 2020-07-08 DIAGNOSIS — R0902 Hypoxemia: Secondary | ICD-10-CM

## 2020-07-09 ENCOUNTER — Other Ambulatory Visit: Payer: Self-pay | Admitting: Internal Medicine

## 2020-07-09 DIAGNOSIS — J9621 Acute and chronic respiratory failure with hypoxia: Secondary | ICD-10-CM

## 2020-07-09 DIAGNOSIS — I482 Chronic atrial fibrillation, unspecified: Secondary | ICD-10-CM | POA: Diagnosis not present

## 2020-07-09 DIAGNOSIS — J9690 Respiratory failure, unspecified, unspecified whether with hypoxia or hypercapnia: Secondary | ICD-10-CM | POA: Diagnosis not present

## 2020-07-09 DIAGNOSIS — I469 Cardiac arrest, cause unspecified: Secondary | ICD-10-CM | POA: Diagnosis not present

## 2020-07-09 DIAGNOSIS — E870 Hyperosmolality and hypernatremia: Secondary | ICD-10-CM | POA: Diagnosis not present

## 2020-07-09 DIAGNOSIS — J189 Pneumonia, unspecified organism: Secondary | ICD-10-CM

## 2020-07-09 DIAGNOSIS — D62 Acute posthemorrhagic anemia: Secondary | ICD-10-CM | POA: Diagnosis not present

## 2020-07-09 LAB — BASIC METABOLIC PANEL
Anion gap: 12 (ref 5–15)
BUN: 54 mg/dL — ABNORMAL HIGH (ref 8–23)
CO2: 24 mmol/L (ref 22–32)
Calcium: 9.2 mg/dL (ref 8.9–10.3)
Chloride: 102 mmol/L (ref 98–111)
Creatinine, Ser: 1.28 mg/dL — ABNORMAL HIGH (ref 0.61–1.24)
GFR, Estimated: 57 mL/min — ABNORMAL LOW (ref >60)
Glucose, Bld: 121 mg/dL — ABNORMAL HIGH (ref 70–99)
Potassium: 3 mmol/L — ABNORMAL LOW (ref 3.5–5.1)
Sodium: 138 mmol/L (ref 135–145)

## 2020-07-09 LAB — CBC
HCT: 30 % — ABNORMAL LOW (ref 39.0–52.0)
Hemoglobin: 9 g/dL — ABNORMAL LOW (ref 13.0–17.0)
MCH: 26.5 pg (ref 26.0–34.0)
MCHC: 30 g/dL (ref 30.0–36.0)
MCV: 88.5 fL (ref 80.0–100.0)
Platelets: 210 10*3/uL (ref 150–400)
RBC: 3.39 MIL/uL — ABNORMAL LOW (ref 4.22–5.81)
RDW: 17.4 % — ABNORMAL HIGH (ref 11.5–15.5)
WBC: 7.9 10*3/uL (ref 4.0–10.5)
nRBC: 0 % (ref 0.0–0.2)

## 2020-07-09 NOTE — Progress Notes (Signed)
Pulmonary Critical Care Medicine [place]   PULMONARY CRITICAL CARE SERVICE  PROGRESS NOTE  Date of Service: 07/09/2020  Daniel [name]  EEF:[id number]  DOB: November 21, 1940   DOA: 07/08/2020  Referring Physician: Merton Border, MD  HPI: Daniel [name] is a 80 y.o. male seen for follow up of Acute on Chronic Respiratory Failure.  Patient had the mandibular hardware removed right now appears to be comfortable without distress he is weaning on T collar with a goal of 8 hours today  Medications: Reviewed on Rounds  Physical Exam:  Vitals: Temperature is 97.2 pulse 72 respiratory 16 blood pressure is 119/68 saturations 97%  Ventilator Settings off the ventilator on T collar FiO2 28%  . General: Comfortable at this time . Eyes: Grossly normal lids, irises & conjunctiva . ENT: grossly tongue is normal . Neck: no obvious mass . Cardiovascular: S1 S2 normal no gallop . Respiratory: Scattered rhonchi expansion is equal . Abdomen: soft . Skin: no rash seen on limited exam . Musculoskeletal: not rigid . Psychiatric:unable to assess . Neurologic: no seizure no involuntary movements         Lab Data:   Basic Metabolic Panel: Recent Labs  Lab 07/03/20 1001 07/07/20 0439 07/09/20 0418  NA  --  140 138  K 3.5 3.5 3.0*  CL  --  105 102  CO2  --  24 24  GLUCOSE  --  100* 121*  BUN  --  56* 54*  CREATININE  --  1.12 1.28*  CALCIUM  --  9.3 9.2    ABG: No results for input(s): PHART, PCO2ART, PO2ART, HCO3, O2SAT in the last 168 hours.  Liver Function Tests: No results for input(s): AST, ALT, ALKPHOS, BILITOT, PROT, ALBUMIN in the last 168 hours. No results for input(s): LIPASE, AMYLASE in the last 168 hours. No results for input(s): AMMONIA in the last 168 hours.  CBC: Recent Labs  Lab 07/04/20 0447 07/07/20 0439 07/09/20 0418  WBC 7.5 6.8 7.9  HGB 9.7* 9.3* 9.0*  HCT 32.2* 31.6* 30.0*  MCV 89.9 90.0 88.5  PLT 186 195 210    Cardiac Enzymes: No  results for input(s): CKTOTAL, CKMB, CKMBINDEX, TROPONINI in the last 168 hours.  BNP (last 3 results) No results for input(s): BNP in the last 8760 hours.  ProBNP (last 3 results) No results for input(s): PROBNP in the last 8760 hours.  Radiological Exams: No results found.  Assessment/Plan Active Problems:   Acute on chronic respiratory failure with hypoxia (HCC)   Healthcare-associated pneumonia   Cardiac arrest (HCC)   Chronic atrial fibrillation ([place])   1. Acute on chronic respiratory failure hypoxia we will continue with the weaning protocol on T collar currently on 28% FiO2 with a goal of 8 hours 2. Healthcare associated pneumonia treated slowly improving 3. Cardiac arrest rhythm has been stable 4. Chronic atrial fibrillation cardiology following the patient along   I have personally seen and evaluated the patient, evaluated laboratory and imaging results, formulated the assessment and plan and placed orders. The Patient requires high complexity decision making with multiple systems involvement.  Rounds were done with the Respiratory Therapy Director and Staff therapists and discussed with nursing staff also.  [name], MD [facility] Pulmonary Critical Care Medicine Sleep Medicine

## 2020-07-10 DIAGNOSIS — J9621 Acute and chronic respiratory failure with hypoxia: Secondary | ICD-10-CM | POA: Diagnosis not present

## 2020-07-10 DIAGNOSIS — J9690 Respiratory failure, unspecified, unspecified whether with hypoxia or hypercapnia: Secondary | ICD-10-CM | POA: Diagnosis not present

## 2020-07-10 DIAGNOSIS — E870 Hyperosmolality and hypernatremia: Secondary | ICD-10-CM | POA: Diagnosis not present

## 2020-07-10 DIAGNOSIS — D62 Acute posthemorrhagic anemia: Secondary | ICD-10-CM | POA: Diagnosis not present

## 2020-07-10 DIAGNOSIS — I482 Chronic atrial fibrillation, unspecified: Secondary | ICD-10-CM | POA: Diagnosis not present

## 2020-07-10 DIAGNOSIS — I469 Cardiac arrest, cause unspecified: Secondary | ICD-10-CM | POA: Diagnosis not present

## 2020-07-10 LAB — POTASSIUM: Potassium: 3.1 mmol/L — ABNORMAL LOW (ref 3.5–5.1)

## 2020-07-10 NOTE — Progress Notes (Signed)
Pulmonary Critical Care Medicine [place]   PULMONARY CRITICAL CARE SERVICE  PROGRESS NOTE  Date of Service: 07/10/2020  Daniel [name]  DPO:[id number]  DOB: [birth year]   DOA: 07/08/2020  Referring Physician: Merton Border, MD  HPI: Daniel [name] is a 80 y.o. male seen for follow up of Acute on Chronic Respiratory Failure.  Patient is on T collar right now on 28% FiO2 with a goal of 12 hours  Medications: Reviewed on Rounds  Physical Exam:  Vitals: Temperature is 96.4 pulse 66 respiratory 14 blood pressure is 121/73 saturations 99%  Ventilator Settings on T collar with an FiO2 28%  . General: Comfortable at this time . Eyes: Grossly normal lids, irises & conjunctiva . ENT: grossly tongue is normal . Neck: no obvious mass . Cardiovascular: S1 S2 normal no gallop . Respiratory: No rhonchi no rales noted at this time . Abdomen: soft . Skin: no rash seen on limited exam . Musculoskeletal: not rigid . Psychiatric:unable to assess . Neurologic: no seizure no involuntary movements         Lab Data:   Basic Metabolic Panel: Recent Labs  Lab 07/03/20 1001 07/07/20 0439 07/09/20 0418 07/10/20 0404  NA  --  140 138  --   K 3.5 3.5 3.0* 3.1*  CL  --  105 102  --   CO2  --  24 24  --   GLUCOSE  --  100* 121*  --   BUN  --  56* 54*  --   CREATININE  --  1.12 1.28*  --   CALCIUM  --  9.3 9.2  --     ABG: No results for input(s): PHART, PCO2ART, PO2ART, HCO3, O2SAT in the last 168 hours.  Liver Function Tests: No results for input(s): AST, ALT, ALKPHOS, BILITOT, PROT, ALBUMIN in the last 168 hours. No results for input(s): LIPASE, AMYLASE in the last 168 hours. No results for input(s): AMMONIA in the last 168 hours.  CBC: Recent Labs  Lab 07/04/20 0447 07/07/20 0439 07/09/20 0418  WBC 7.5 6.8 7.9  HGB 9.7* 9.3* 9.0*  HCT 32.2* 31.6* 30.0*  MCV 89.9 90.0 88.5  PLT 186 195 210    Cardiac Enzymes: No results for input(s): CKTOTAL, CKMB,  CKMBINDEX, TROPONINI in the last 168 hours.  BNP (last 3 results) No results for input(s): BNP in the last 8760 hours.  ProBNP (last 3 results) No results for input(s): PROBNP in the last 8760 hours.  Radiological Exams: No results found.  Assessment/Plan Active Problems:   Acute on chronic respiratory failure with hypoxia (HCC)   Healthcare-associated pneumonia   Cardiac arrest (HCC)   Chronic atrial fibrillation ([place])   1. Acute on chronic respiratory failure hypoxia plan is to continue with weaning for 12 hours 2. Healthcare associated pneumonia treated slow improvement 3. Cardiac arrest rhythm stable 4. Chronic atrial fibrillation rate is controlled   I have personally seen and evaluated the patient, evaluated laboratory and imaging results, formulated the assessment and plan and placed orders. The Patient requires high complexity decision making with multiple systems involvement.  Rounds were done with the Respiratory Therapy Director and Staff therapists and discussed with nursing staff also.  Daniel Gee, MD [facility] Pulmonary Critical Care Medicine Sleep Medicine

## 2020-07-11 ENCOUNTER — Other Ambulatory Visit: Payer: Self-pay

## 2020-07-11 DIAGNOSIS — D62 Acute posthemorrhagic anemia: Secondary | ICD-10-CM | POA: Diagnosis not present

## 2020-07-11 DIAGNOSIS — I469 Cardiac arrest, cause unspecified: Secondary | ICD-10-CM | POA: Diagnosis not present

## 2020-07-11 DIAGNOSIS — J9690 Respiratory failure, unspecified, unspecified whether with hypoxia or hypercapnia: Secondary | ICD-10-CM | POA: Diagnosis not present

## 2020-07-11 DIAGNOSIS — I482 Chronic atrial fibrillation, unspecified: Secondary | ICD-10-CM | POA: Diagnosis not present

## 2020-07-11 DIAGNOSIS — E870 Hyperosmolality and hypernatremia: Secondary | ICD-10-CM | POA: Diagnosis not present

## 2020-07-11 DIAGNOSIS — J9621 Acute and chronic respiratory failure with hypoxia: Secondary | ICD-10-CM | POA: Diagnosis not present

## 2020-07-11 LAB — BASIC METABOLIC PANEL
Anion gap: 11 (ref 5–15)
BUN: 59 mg/dL — ABNORMAL HIGH (ref 8–23)
CO2: 24 mmol/L (ref 22–32)
Calcium: 9.3 mg/dL (ref 8.9–10.3)
Chloride: 111 mmol/L (ref 98–111)
Creatinine, Ser: 1.32 mg/dL — ABNORMAL HIGH (ref 0.61–1.24)
GFR, Estimated: 55 mL/min — ABNORMAL LOW (ref >60)
Glucose, Bld: 118 mg/dL — ABNORMAL HIGH (ref 70–99)
Potassium: 3.5 mmol/L (ref 3.5–5.1)
Sodium: 146 mmol/L — ABNORMAL HIGH (ref 135–145)

## 2020-07-11 LAB — BLOOD GAS, ARTERIAL
Acid-base deficit: 1.2 mmol/L (ref 0.0–2.0)
Acid-base deficit: 0.9 mmol/L (ref 0.0–2.0)
Bicarbonate: 25.7 mmol/L (ref 20.0–28.0)
Bicarbonate: 24.9 mmol/L (ref 20.0–28.0)
FIO2: 35
FIO2: 28
O2 Saturation: 80.7 %
O2 Saturation: 92.4 %
Patient temperature: 37
Patient temperature: 37
pCO2 arterial: 66.2 mmHg (ref 32.0–48.0)
pCO2 arterial: 53.9 mmHg — ABNORMAL HIGH (ref 32.0–48.0)
pH, Arterial: 7.213 — ABNORMAL LOW (ref 7.350–7.450)
pH, Arterial: 7.286 — ABNORMAL LOW (ref 7.350–7.450)
pO2, Arterial: 52.5 mmHg — ABNORMAL LOW (ref 83.0–108.0)
pO2, Arterial: 67.9 mmHg — ABNORMAL LOW (ref 83.0–108.0)

## 2020-07-11 NOTE — Progress Notes (Signed)
Pulmonary Critical Care Medicine [place]   PULMONARY CRITICAL CARE SERVICE  PROGRESS NOTE  Date of Service: 07/11/2020  Daniel [name]  DJM:[id number]  DOB: 12-Dec-1940   DOA: 07/08/2020  Referring Physician: Merton Border, MD  HPI: Daniel [name] is a 80 y.o. male seen for follow up of Acute on Chronic Respiratory Failure. Patient is resting comfortably right now without distress to about 6 hours of T collar yesterday out of 12 hours today will be reassessed  Medications: Reviewed on Rounds  Physical Exam:  Vitals: Temperature is 98.7 pulse 66 respiratory rate 16 blood pressure is 108/64 saturations 99%  Ventilator Settings currently is on assist control mode on 28% FiO2 with a tidal volume of 580 and a PEEP of 5  . General: Comfortable at this time . Eyes: Grossly normal lids, irises & conjunctiva . ENT: grossly tongue is normal . Neck: no obvious mass . Cardiovascular: S1 S2 normal no gallop . Respiratory: Scattered rhonchi very coarse breath sounds . Abdomen: soft . Skin: no rash seen on limited exam . Musculoskeletal: not rigid . Psychiatric:unable to assess . Neurologic: no seizure no involuntary movements         Lab Data:   Basic Metabolic Panel: Recent Labs  Lab 07/07/20 0439 07/09/20 0418 07/10/20 0404 07/11/20 0455  NA 140 138  --  146*  K 3.5 3.0* 3.1* 3.5  CL 105 102  --  111  CO2 24 24  --  24  GLUCOSE 100* 121*  --  118*  BUN 56* 54*  --  59*  CREATININE 1.12 1.28*  --  1.32*  CALCIUM 9.3 9.2  --  9.3    ABG: No results for input(s): PHART, PCO2ART, PO2ART, HCO3, O2SAT in the last 168 hours.  Liver Function Tests: No results for input(s): AST, ALT, ALKPHOS, BILITOT, PROT, ALBUMIN in the last 168 hours. No results for input(s): LIPASE, AMYLASE in the last 168 hours. No results for input(s): AMMONIA in the last 168 hours.  CBC: Recent Labs  Lab 07/07/20 0439 07/09/20 0418  WBC 6.8 7.9  HGB 9.3* 9.0*  HCT 31.6*  30.0*  MCV 90.0 88.5  PLT 195 210    Cardiac Enzymes: No results for input(s): CKTOTAL, CKMB, CKMBINDEX, TROPONINI in the last 168 hours.  BNP (last 3 results) No results for input(s): BNP in the last 8760 hours.  ProBNP (last 3 results) No results for input(s): PROBNP in the last 8760 hours.  Radiological Exams: No results found.  Assessment/Plan Active Problems:   Acute on chronic respiratory failure with hypoxia (HCC)   Healthcare-associated pneumonia   Cardiac arrest (HCC)   Chronic atrial fibrillation ([place])   1. Acute on chronic respiratory failure with hypoxia the plan is going to be to wean on T collar again try for 12 hours today. 2. Healthcare associated pneumonia treated slowly improving 3. Cardiac arrest rhythm is stable we will continue to monitor 4. Chronic atrial fibrillation rate now rate is controlled we will continue to follow along.   I have personally seen and evaluated the patient, evaluated laboratory and imaging results, formulated the assessment and plan and placed orders. The Patient requires high complexity decision making with multiple systems involvement.  Rounds were done with the Respiratory Therapy Director and Staff therapists and discussed with nursing staff also.  [name], MD [facility] Pulmonary Critical Care Medicine Sleep Medicine

## 2020-07-12 ENCOUNTER — Other Ambulatory Visit (HOSPITAL_COMMUNITY): Payer: Self-pay

## 2020-07-12 DIAGNOSIS — I482 Chronic atrial fibrillation, unspecified: Secondary | ICD-10-CM | POA: Diagnosis not present

## 2020-07-12 DIAGNOSIS — I469 Cardiac arrest, cause unspecified: Secondary | ICD-10-CM | POA: Diagnosis not present

## 2020-07-12 DIAGNOSIS — N179 Acute kidney failure, unspecified: Secondary | ICD-10-CM | POA: Diagnosis not present

## 2020-07-12 DIAGNOSIS — J9690 Respiratory failure, unspecified, unspecified whether with hypoxia or hypercapnia: Secondary | ICD-10-CM | POA: Diagnosis not present

## 2020-07-12 DIAGNOSIS — J9621 Acute and chronic respiratory failure with hypoxia: Secondary | ICD-10-CM | POA: Diagnosis not present

## 2020-07-12 DIAGNOSIS — E870 Hyperosmolality and hypernatremia: Secondary | ICD-10-CM | POA: Diagnosis not present

## 2020-07-12 DIAGNOSIS — J90 Pleural effusion, not elsewhere classified: Secondary | ICD-10-CM | POA: Diagnosis not present

## 2020-07-12 IMAGING — DX DG CHEST 1V PORT
1 series · 1 of 1 positions shown · non-contrast
Comparison: [DATE]

CLINICAL DATA: Check tracheostomy placement

EXAM:
PORTABLE CHEST 1 VIEW

[chest]
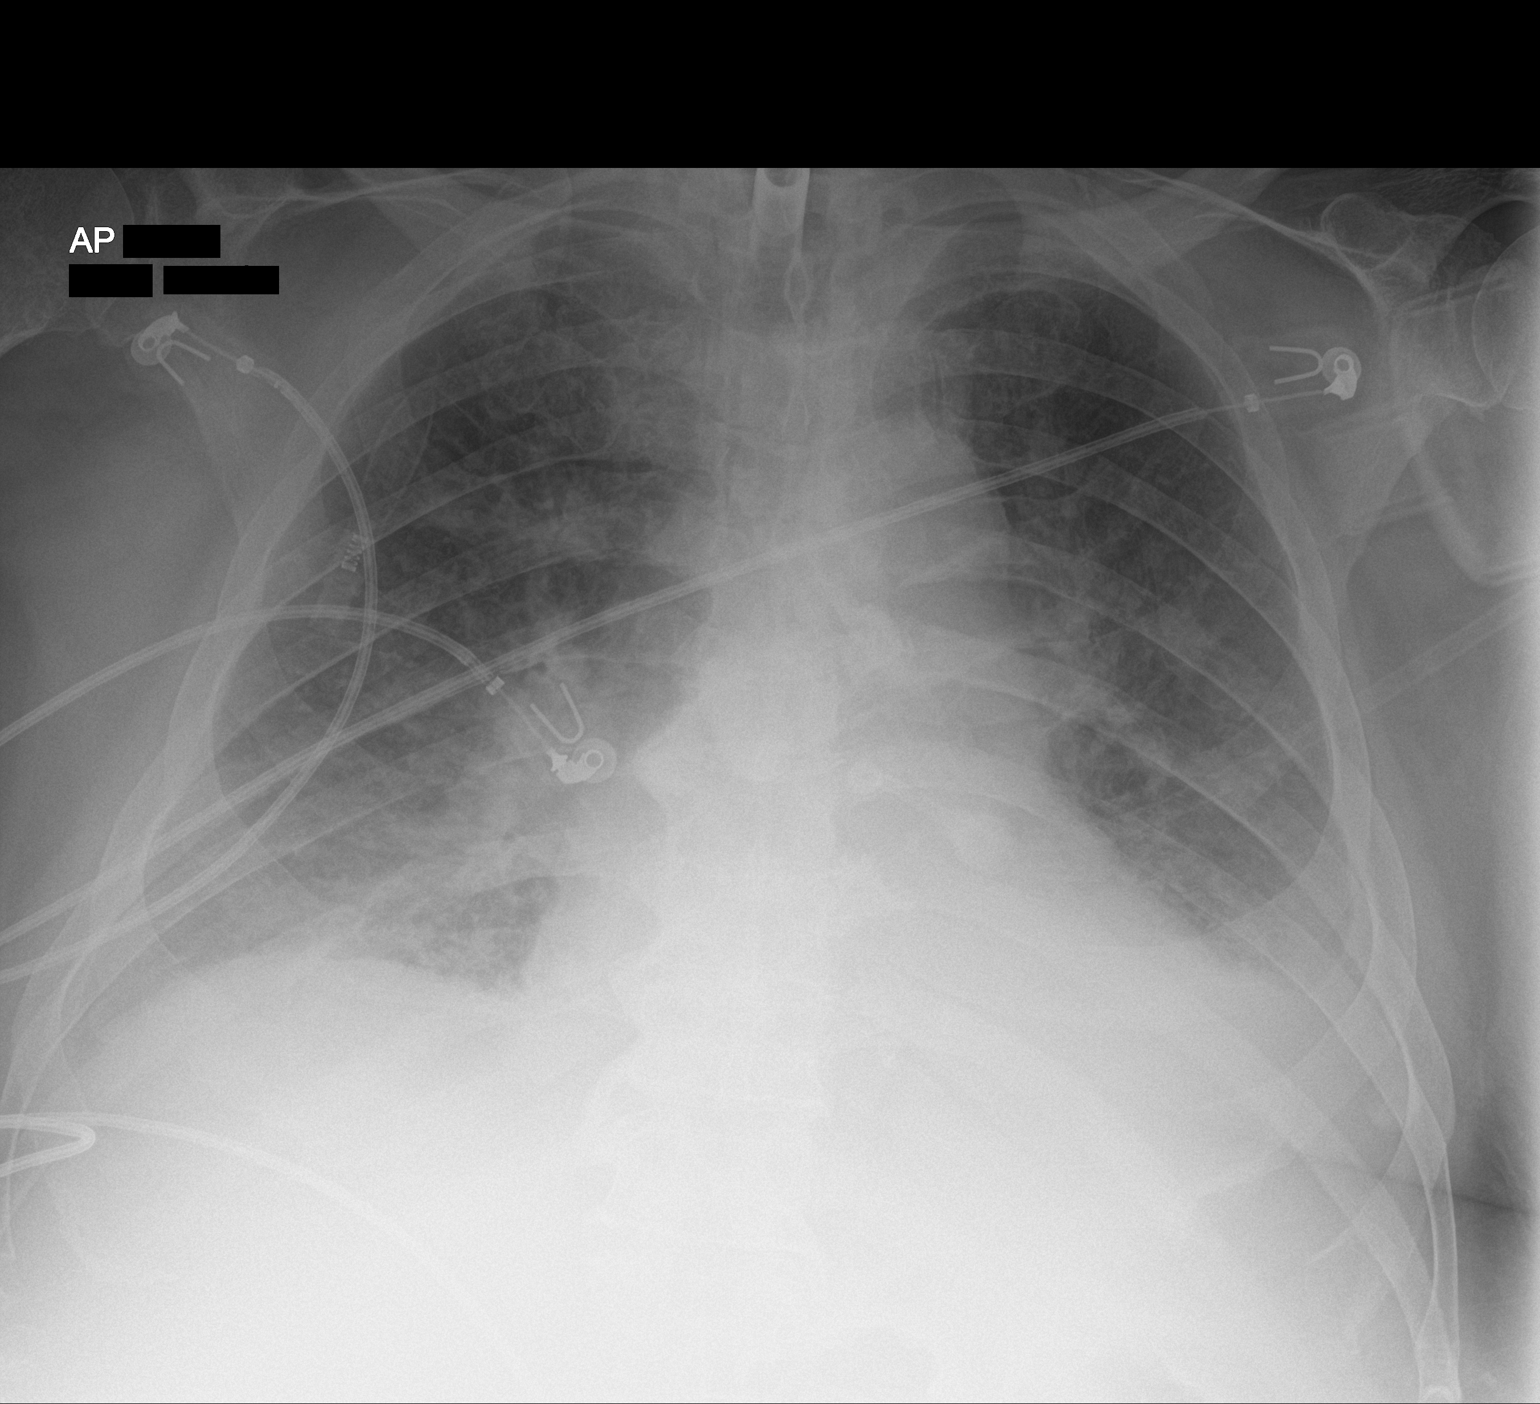

[1 of 1 positions shown; findings below may reference images not displayed]

FINDINGS: Cardiac shadow is stable. Tracheostomy tube is again noted in
satisfactory position. Slight increase in left basilar effusion is
noted. Mild central vascular congestion is seen as well. No bony
abnormality is seen.
IMPRESSION: Mild vascular congestion with slight increase in left basilar
effusion.

## 2020-07-12 NOTE — Progress Notes (Signed)
Pulmonary Critical Care Medicine [place]   PULMONARY CRITICAL CARE SERVICE  PROGRESS NOTE  Date of Service: 07/12/2020  HULEN MANDLER  WNI:[id number]  DOB: [birth year]   DOA: 07/08/2020  Referring Physician: Merton Border, MD  HPI: Daniel [name] is a 80 y.o. male seen for follow up of Acute on Chronic Respiratory Failure.  Patient is on full support on the ventilator at this time on assist control mode has not been tolerating attempts at weaning.  Medications: Reviewed on Rounds  Physical Exam:  Vitals: Temperature is 96.7 pulse 108 respiratory rate 20 blood pressure is 98/52 saturations 100%  Ventilator Settings on assist control FiO2 35% tidal volume is 580 PEEP 5  . General: Comfortable at this time . Eyes: Grossly normal lids, irises & conjunctiva . ENT: grossly tongue is normal . Neck: no obvious mass . Cardiovascular: S1 S2 normal no gallop . Respiratory: Coarse breath sounds with few scattered rhonchi . Abdomen: soft . Skin: no rash seen on limited exam . Musculoskeletal: not rigid . Psychiatric:unable to assess . Neurologic: no seizure no involuntary movements         Lab Data:   Basic Metabolic Panel: Recent Labs  Lab 07/07/20 0439 07/09/20 0418 07/10/20 0404 07/11/20 0455  NA 140 138  --  146*  K 3.5 3.0* 3.1* 3.5  CL 105 102  --  111  CO2 24 24  --  24  GLUCOSE 100* 121*  --  118*  BUN 56* 54*  --  59*  CREATININE 1.12 1.28*  --  1.32*  CALCIUM 9.3 9.2  --  9.3    ABG: Recent Labs  Lab 07/11/20 1620 07/11/20 1745  PHART 7.213* 7.286*  PCO2ART 66.2* 53.9*  PO2ART 52.5* 67.9*  HCO3 25.7 24.9  O2SAT 80.7 92.4    Liver Function Tests: No results for input(s): AST, ALT, ALKPHOS, BILITOT, PROT, ALBUMIN in the last 168 hours. No results for input(s): LIPASE, AMYLASE in the last 168 hours. No results for input(s): AMMONIA in the last 168 hours.  CBC: Recent Labs  Lab 07/07/20 0439 07/09/20 0418  WBC 6.8 7.9  HGB  9.3* 9.0*  HCT 31.6* 30.0*  MCV 90.0 88.5  PLT 195 210    Cardiac Enzymes: No results for input(s): CKTOTAL, CKMB, CKMBINDEX, TROPONINI in the last 168 hours.  BNP (last 3 results) No results for input(s): BNP in the last 8760 hours.  ProBNP (last 3 results) No results for input(s): PROBNP in the last 8760 hours.  Radiological Exams: No results found.  Assessment/Plan Active Problems:   Acute on chronic respiratory failure with hypoxia (HCC)   Healthcare-associated pneumonia   Cardiac arrest (HCC)   Chronic atrial fibrillation ([place])   1. Acute on chronic respiratory failure with hypoxia plan is to continue with the full support on the ventilator patient has not been tolerating weaning. 2. Healthcare associated pneumonia treated we will continue with supportive care 3. Cardiac arrest rhythm has been stable 4. Chronic atrial fibrillation rate is controlled at this time   I have personally seen and evaluated the patient, evaluated laboratory and imaging results, formulated the assessment and plan and placed orders. The Patient requires high complexity decision making with multiple systems involvement.  Rounds were done with the Respiratory Therapy Director and Staff therapists and discussed with nursing staff also.  [name], MD [facility] Pulmonary Critical Care Medicine Sleep Medicine

## 2020-07-13 DIAGNOSIS — I482 Chronic atrial fibrillation, unspecified: Secondary | ICD-10-CM | POA: Diagnosis not present

## 2020-07-13 DIAGNOSIS — I469 Cardiac arrest, cause unspecified: Secondary | ICD-10-CM | POA: Diagnosis not present

## 2020-07-13 DIAGNOSIS — N179 Acute kidney failure, unspecified: Secondary | ICD-10-CM | POA: Diagnosis not present

## 2020-07-13 DIAGNOSIS — J9690 Respiratory failure, unspecified, unspecified whether with hypoxia or hypercapnia: Secondary | ICD-10-CM | POA: Diagnosis not present

## 2020-07-13 DIAGNOSIS — S02651A Fracture of angle of right mandible, initial encounter for closed fracture: Secondary | ICD-10-CM | POA: Diagnosis not present

## 2020-07-13 DIAGNOSIS — J9621 Acute and chronic respiratory failure with hypoxia: Secondary | ICD-10-CM | POA: Diagnosis not present

## 2020-07-13 NOTE — Progress Notes (Signed)
Pulmonary Critical Care Medicine [place]   PULMONARY CRITICAL CARE SERVICE  PROGRESS NOTE  Date of Service: 07/13/2020  SACHIT GILMAN  WUJ:[id number]  DOB: [birth year]   DOA: 07/08/2020  Referring Physician: Merton Border, MD  HPI: Daniel [name] is a 80 y.o. male seen for follow up of Acute on Chronic Respiratory Failure.  Reviewing the patient's blood gases from the last 48 hours status is tenuous patient has been having some issues with abdominal bloating yesterday on the day 5.5 hours of T collar this morning is on pressure support  Medications: Reviewed on Rounds  Physical Exam:  Vitals: Temperature is 96.7 pulse 60 respiratory rate 20 blood pressure is 138/77 saturations 100%  Ventilator Settings on pressure support FiO2 30% pressure 12/5  . General: Comfortable at this time . Eyes: Grossly normal lids, irises & conjunctiva . ENT: grossly tongue is normal . Neck: no obvious mass . Cardiovascular: S1 S2 normal no gallop . Respiratory: Scattered rhonchi very coarse breath sound . Abdomen: soft . Skin: no rash seen on limited exam . Musculoskeletal: not rigid . Psychiatric:unable to assess . Neurologic: no seizure no involuntary movements         Lab Data:   Basic Metabolic Panel: Recent Labs  Lab 07/07/20 0439 07/09/20 0418 07/10/20 0404 07/11/20 0455  NA 140 138  --  146*  K 3.5 3.0* 3.1* 3.5  CL 105 102  --  111  CO2 24 24  --  24  GLUCOSE 100* 121*  --  118*  BUN 56* 54*  --  59*  CREATININE 1.12 1.28*  --  1.32*  CALCIUM 9.3 9.2  --  9.3    ABG: Recent Labs  Lab 07/11/20 1620 07/11/20 1745  PHART 7.213* 7.286*  PCO2ART 66.2* 53.9*  PO2ART 52.5* 67.9*  HCO3 25.7 24.9  O2SAT 80.7 92.4    Liver Function Tests: No results for input(s): AST, ALT, ALKPHOS, BILITOT, PROT, ALBUMIN in the last 168 hours. No results for input(s): LIPASE, AMYLASE in the last 168 hours. No results for input(s): AMMONIA in the last 168  hours.  CBC: Recent Labs  Lab 07/07/20 0439 07/09/20 0418  WBC 6.8 7.9  HGB 9.3* 9.0*  HCT 31.6* 30.0*  MCV 90.0 88.5  PLT 195 210    Cardiac Enzymes: No results for input(s): CKTOTAL, CKMB, CKMBINDEX, TROPONINI in the last 168 hours.  BNP (last 3 results) No results for input(s): BNP in the last 8760 hours.  ProBNP (last 3 results) No results for input(s): PROBNP in the last 8760 hours.  Radiological Exams: DG CHEST PORT 1 VIEW  Result Date: 07/12/2020 CLINICAL DATA:  Check tracheostomy placement EXAM: PORTABLE CHEST 1 VIEW COMPARISON:  07/02/2020 FINDINGS: Cardiac shadow is stable. Tracheostomy tube is again noted in satisfactory position. Slight increase in left basilar effusion is noted. Mild central vascular congestion is seen as well. No bony abnormality is seen. IMPRESSION: Mild vascular congestion with slight increase in left basilar effusion. Electronically Signed   By: Inez Catalina M.D.   On: 07/12/2020 11:35    Assessment/Plan Active Problems:   Acute on chronic respiratory failure with hypoxia (HCC)   Healthcare-associated pneumonia   Cardiac arrest (HCC)   Chronic atrial fibrillation ([place])   1. Acute on chronic respiratory failure hypoxia we will continue with a pressure support on 30% FiO2 and may even suggest placing back on full support because of the abdominal issues that are going on 2. Healthcare associated pneumonia treated  slow improvement 3. Cardiac arrest rhythm stable 4. Chronic atrial fibrillation rate now rate is controlled   I have personally seen and evaluated the patient, evaluated laboratory and imaging results, formulated the assessment and plan and placed orders. The Patient requires high complexity decision making with multiple systems involvement.  Rounds were done with the Respiratory Therapy Director and Staff therapists and discussed with nursing staff also.  [name], MD [facility] Pulmonary Critical Care Medicine Sleep  Medicine

## 2020-07-14 ENCOUNTER — Other Ambulatory Visit (HOSPITAL_COMMUNITY): Payer: Self-pay

## 2020-07-14 DIAGNOSIS — J9621 Acute and chronic respiratory failure with hypoxia: Secondary | ICD-10-CM | POA: Diagnosis not present

## 2020-07-14 DIAGNOSIS — E870 Hyperosmolality and hypernatremia: Secondary | ICD-10-CM | POA: Diagnosis not present

## 2020-07-14 DIAGNOSIS — I469 Cardiac arrest, cause unspecified: Secondary | ICD-10-CM | POA: Diagnosis not present

## 2020-07-14 DIAGNOSIS — G9340 Encephalopathy, unspecified: Secondary | ICD-10-CM | POA: Diagnosis not present

## 2020-07-14 DIAGNOSIS — J9690 Respiratory failure, unspecified, unspecified whether with hypoxia or hypercapnia: Secondary | ICD-10-CM | POA: Diagnosis not present

## 2020-07-14 DIAGNOSIS — I482 Chronic atrial fibrillation, unspecified: Secondary | ICD-10-CM | POA: Diagnosis not present

## 2020-07-14 DIAGNOSIS — R140 Abdominal distension (gaseous): Secondary | ICD-10-CM | POA: Diagnosis not present

## 2020-07-14 DIAGNOSIS — N179 Acute kidney failure, unspecified: Secondary | ICD-10-CM | POA: Diagnosis not present

## 2020-07-14 LAB — BASIC METABOLIC PANEL
Anion gap: 12 (ref 5–15)
BUN: 63 mg/dL — ABNORMAL HIGH (ref 8–23)
CO2: 21 mmol/L — ABNORMAL LOW (ref 22–32)
Calcium: 9.5 mg/dL (ref 8.9–10.3)
Chloride: 113 mmol/L — ABNORMAL HIGH (ref 98–111)
Creatinine, Ser: 1.28 mg/dL — ABNORMAL HIGH (ref 0.61–1.24)
GFR, Estimated: 57 mL/min — ABNORMAL LOW (ref >60)
Glucose, Bld: 109 mg/dL — ABNORMAL HIGH (ref 70–99)
Potassium: 3.3 mmol/L — ABNORMAL LOW (ref 3.5–5.1)
Sodium: 146 mmol/L — ABNORMAL HIGH (ref 135–145)

## 2020-07-14 LAB — CBC
HCT: 35 % — ABNORMAL LOW (ref 39.0–52.0)
Hemoglobin: 10.2 g/dL — ABNORMAL LOW (ref 13.0–17.0)
MCH: 26.6 pg (ref 26.0–34.0)
MCHC: 29.1 g/dL — ABNORMAL LOW (ref 30.0–36.0)
MCV: 91.4 fL (ref 80.0–100.0)
Platelets: 202 10*3/uL (ref 150–400)
RBC: 3.83 MIL/uL — ABNORMAL LOW (ref 4.22–5.81)
RDW: 18.5 % — ABNORMAL HIGH (ref 11.5–15.5)
WBC: 11.1 10*3/uL — ABNORMAL HIGH (ref 4.0–10.5)
nRBC: 0 % (ref 0.0–0.2)

## 2020-07-14 IMAGING — DX DG ABDOMEN 1V
1 series · 3 of 3 positions shown · non-contrast
Comparison: [DATE]

CLINICAL DATA: Abdominal distention

EXAM:
ABDOMEN - 1 VIEW

[Series 1: abdomen · 0.14mm/px · 3 of 3 slices shown]
[im 1/3]
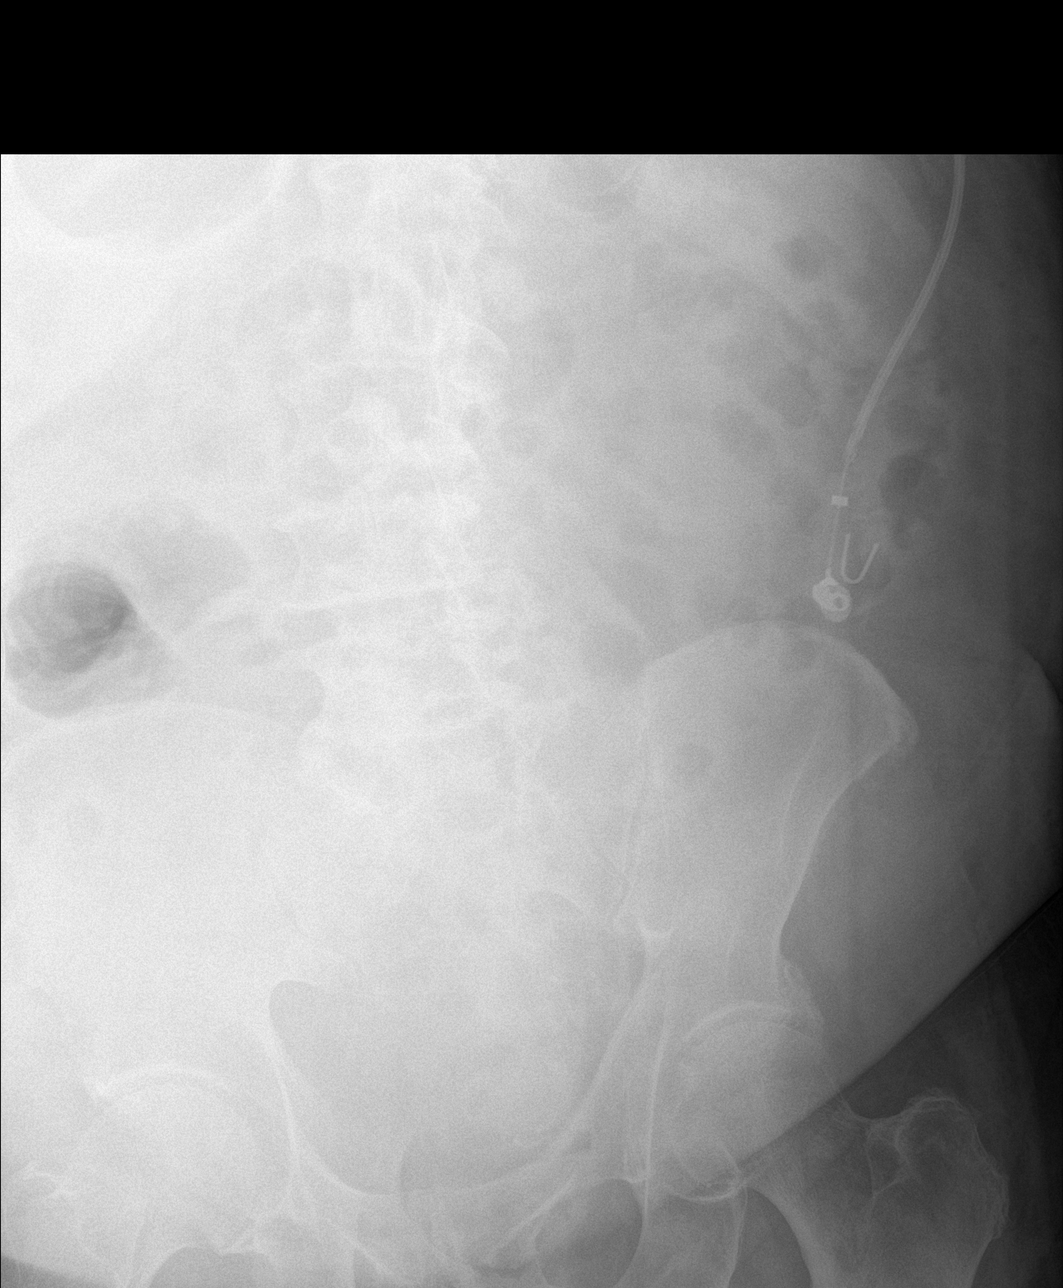
[im 2/3]
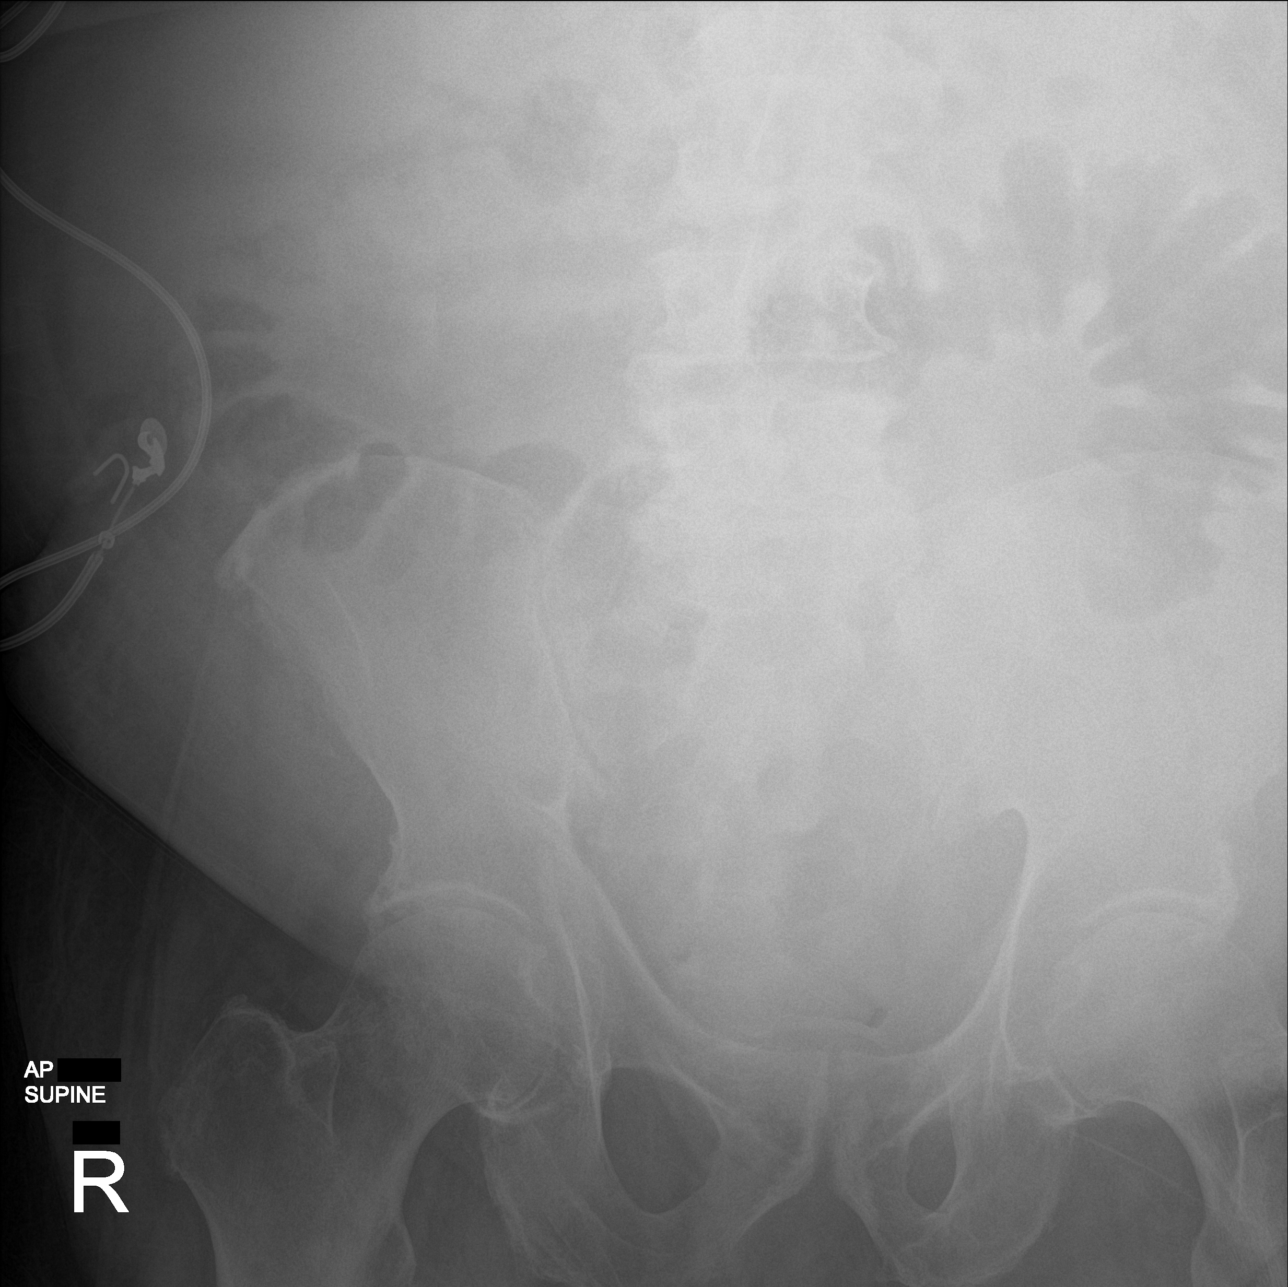
[im 3/3]
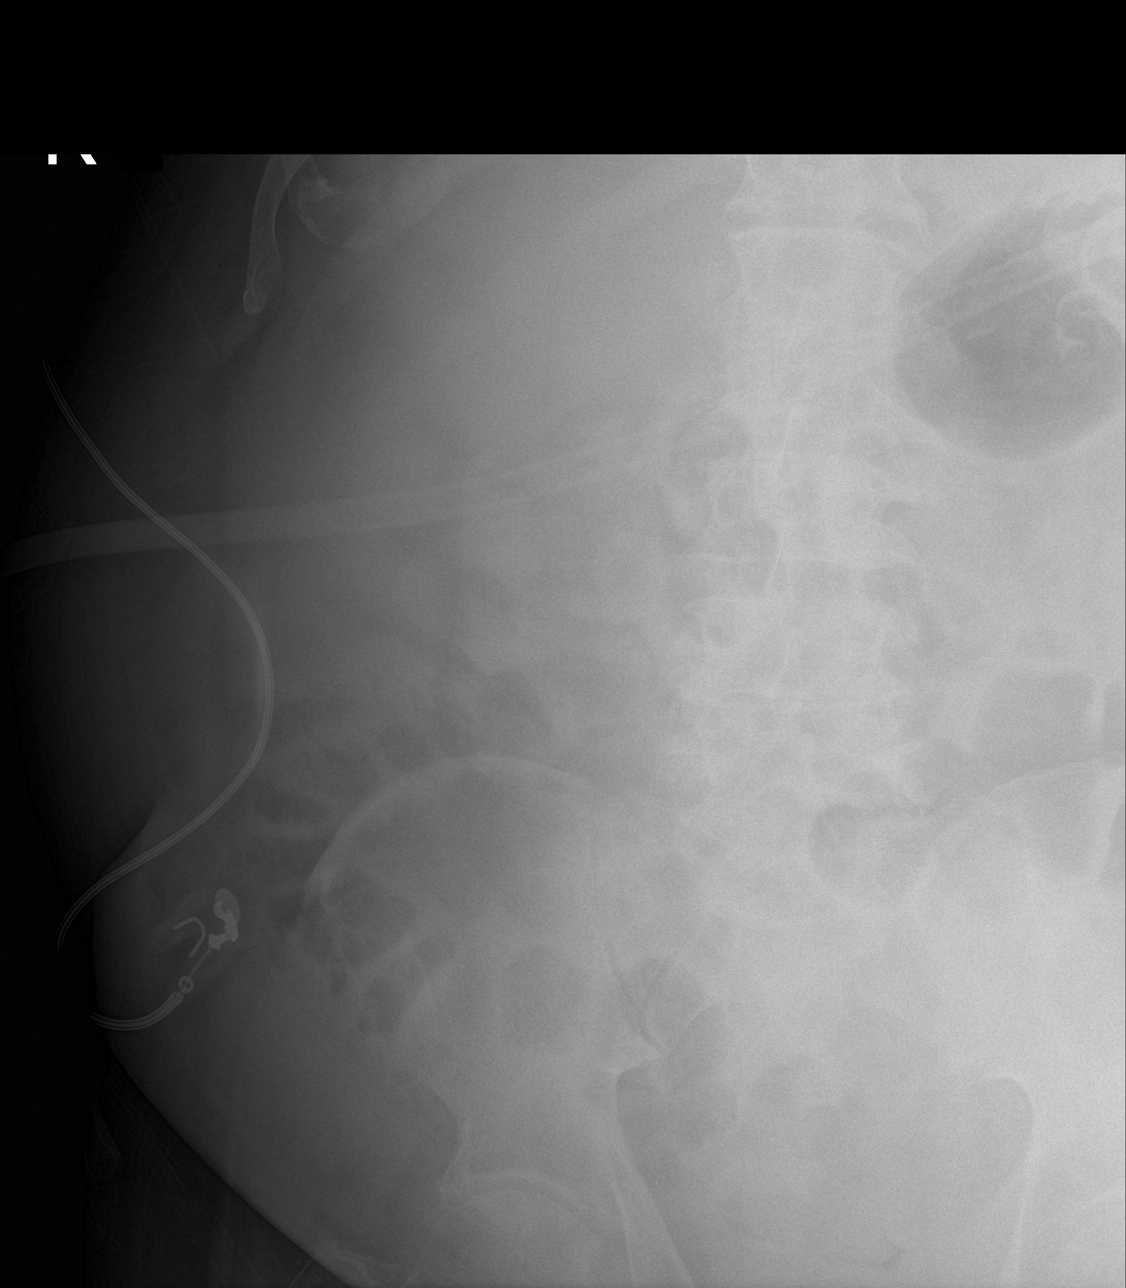

[3 of 3 positions shown; findings below may reference images not displayed]

FINDINGS: Nonobstructive bowel gas pattern. Gas throughout nondistended large
and small bowel. No organomegaly, suspicious calcification or
visible free air.
IMPRESSION: No acute findings.

## 2020-07-14 NOTE — Progress Notes (Signed)
Pulmonary Critical Care Medicine [place]   PULMONARY CRITICAL CARE SERVICE  PROGRESS NOTE  Date of Service: 07/14/2020  Daniel [name]  QQI:[id number]  DOB: 08/30/1940   DOA: 07/08/2020  Referring Physician: Merton Border, MD  HPI: Daniel [name] is a 80 y.o. male seen for follow up of Acute on Chronic Respiratory Failure.  Patient currently is on the ventilator and full support.  Yesterday was able to do 5 hours of T collar but had a drop in saturations her right now is resting  Medications: Reviewed on Rounds  Physical Exam:  Vitals: Temperature is 96.8 pulse 65 respiratory rate 20 blood pressure is 120/69 saturations 100%  Ventilator Settings on assist control FiO2 30% tidal volume 580 PEEP 5  . General: Comfortable at this time . Eyes: Grossly normal lids, irises & conjunctiva . ENT: grossly tongue is normal . Neck: no obvious mass . Cardiovascular: S1 S2 normal no gallop . Respiratory: No rhonchi very coarse breath sound . Abdomen: soft . Skin: no rash seen on limited exam . Musculoskeletal: not rigid . Psychiatric:unable to assess . Neurologic: no seizure no involuntary movements         Lab Data:   Basic Metabolic Panel: Recent Labs  Lab 07/09/20 0418 07/10/20 0404 07/11/20 0455 07/14/20 0451  NA 138  --  146* 146*  K 3.0* 3.1* 3.5 3.3*  CL 102  --  111 113*  CO2 24  --  24 21*  GLUCOSE 121*  --  118* 109*  BUN 54*  --  59* 63*  CREATININE 1.28*  --  1.32* 1.28*  CALCIUM 9.2  --  9.3 9.5    ABG: Recent Labs  Lab 07/11/20 1620 07/11/20 1745  PHART 7.213* 7.286*  PCO2ART 66.2* 53.9*  PO2ART 52.5* 67.9*  HCO3 25.7 24.9  O2SAT 80.7 92.4    Liver Function Tests: No results for input(s): AST, ALT, ALKPHOS, BILITOT, PROT, ALBUMIN in the last 168 hours. No results for input(s): LIPASE, AMYLASE in the last 168 hours. No results for input(s): AMMONIA in the last 168 hours.  CBC: Recent Labs  Lab 07/09/20 0418 07/14/20 0451   WBC 7.9 11.1*  HGB 9.0* 10.2*  HCT 30.0* 35.0*  MCV 88.5 91.4  PLT 210 202    Cardiac Enzymes: No results for input(s): CKTOTAL, CKMB, CKMBINDEX, TROPONINI in the last 168 hours.  BNP (last 3 results) No results for input(s): BNP in the last 8760 hours.  ProBNP (last 3 results) No results for input(s): PROBNP in the last 8760 hours.  Radiological Exams: DG CHEST PORT 1 VIEW  Result Date: 07/12/2020 CLINICAL DATA:  Check tracheostomy placement EXAM: PORTABLE CHEST 1 VIEW COMPARISON:  07/02/2020 FINDINGS: Cardiac shadow is stable. Tracheostomy tube is again noted in satisfactory position. Slight increase in left basilar effusion is noted. Mild central vascular congestion is seen as well. No bony abnormality is seen. IMPRESSION: Mild vascular congestion with slight increase in left basilar effusion. Electronically Signed   By: Inez Catalina M.D.   On: 07/12/2020 11:35    Assessment/Plan Active Problems:   Acute on chronic respiratory failure with hypoxia (HCC)   Healthcare-associated pneumonia   Cardiac arrest (HCC)   Chronic atrial fibrillation ([place])   1. Acute on chronic respiratory failure with hypoxia plan is to continue with full support on the ventilator right now until bili issues resolved.  We will continue to monitor bleeding readiness 2. Healthcare associated pneumonia patient's last chest x-ray showed some increased  pulmonary vascular congestion and some effusion we will continue to follow-up 3. Cardiac arrest rhythm stable 4. Chronic atrial fibrillation rate controlled   I have personally seen and evaluated the patient, evaluated laboratory and imaging results, formulated the assessment and plan and placed orders. The Patient requires high complexity decision making with multiple systems involvement.  Rounds were done with the Respiratory Therapy Director and Staff therapists and discussed with nursing staff also.  [name], MD [facility] Pulmonary Critical Care  Medicine Sleep Medicine

## 2020-07-15 DIAGNOSIS — S02651A Fracture of angle of right mandible, initial encounter for closed fracture: Secondary | ICD-10-CM | POA: Diagnosis not present

## 2020-07-15 DIAGNOSIS — I482 Chronic atrial fibrillation, unspecified: Secondary | ICD-10-CM | POA: Diagnosis not present

## 2020-07-15 DIAGNOSIS — J9690 Respiratory failure, unspecified, unspecified whether with hypoxia or hypercapnia: Secondary | ICD-10-CM | POA: Diagnosis not present

## 2020-07-15 DIAGNOSIS — J9621 Acute and chronic respiratory failure with hypoxia: Secondary | ICD-10-CM | POA: Diagnosis not present

## 2020-07-15 DIAGNOSIS — I4891 Unspecified atrial fibrillation: Secondary | ICD-10-CM | POA: Diagnosis not present

## 2020-07-15 DIAGNOSIS — N179 Acute kidney failure, unspecified: Secondary | ICD-10-CM | POA: Diagnosis not present

## 2020-07-15 DIAGNOSIS — I469 Cardiac arrest, cause unspecified: Secondary | ICD-10-CM | POA: Diagnosis not present

## 2020-07-15 LAB — SUSCEPTIBILITY RESULT

## 2020-07-15 LAB — CULTURE, RESPIRATORY W GRAM STAIN: Gram Stain: NONE SEEN

## 2020-07-15 LAB — SUSCEPTIBILITY, AER + ANAEROB: Source of Sample: 8680

## 2020-07-15 NOTE — Progress Notes (Signed)
Pulmonary Critical Care Medicine [place]   PULMONARY CRITICAL CARE SERVICE  PROGRESS NOTE  Date of Service: 07/15/2020  RODRIC PUNCH  VOH:[id number]  DOB: 04-Jan-1941   DOA: 07/08/2020  Referring Physician: Merton Border, MD  HPI: CARNEY SAXTON is a 80 y.o. male seen for follow up of Acute on Chronic Respiratory Failure.  Patient is off the ventilator on T collar currently on 35% FiO2  Medications: Reviewed on Rounds  Physical Exam:  Vitals: Temperature is 96.9 pulse 83 respiratory rate 17 blood pressure is 119/60 saturations 96%  Ventilator Settings off the ventilator on T collar FiO2 35%  . General: Comfortable at this time . Eyes: Grossly normal lids, irises & conjunctiva . ENT: grossly tongue is normal . Neck: no obvious mass . Cardiovascular: S1 S2 normal no gallop . Respiratory: No rhonchi rales are noted at this time . Abdomen: soft . Skin: no rash seen on limited exam . Musculoskeletal: not rigid . Psychiatric:unable to assess . Neurologic: no seizure no involuntary movements         Lab Data:   Basic Metabolic Panel: Recent Labs  Lab 07/09/20 0418 07/10/20 0404 07/11/20 0455 07/14/20 0451  NA 138  --  146* 146*  K 3.0* 3.1* 3.5 3.3*  CL 102  --  111 113*  CO2 24  --  24 21*  GLUCOSE 121*  --  118* 109*  BUN 54*  --  59* 63*  CREATININE 1.28*  --  1.32* 1.28*  CALCIUM 9.2  --  9.3 9.5    ABG: Recent Labs  Lab 07/11/20 1620 07/11/20 1745  PHART 7.213* 7.286*  PCO2ART 66.2* 53.9*  PO2ART 52.5* 67.9*  HCO3 25.7 24.9  O2SAT 80.7 92.4    Liver Function Tests: No results for input(s): AST, ALT, ALKPHOS, BILITOT, PROT, ALBUMIN in the last 168 hours. No results for input(s): LIPASE, AMYLASE in the last 168 hours. No results for input(s): AMMONIA in the last 168 hours.  CBC: Recent Labs  Lab 07/09/20 0418 07/14/20 0451  WBC 7.9 11.1*  HGB 9.0* 10.2*  HCT 30.0* 35.0*  MCV 88.5 91.4  PLT 210 202    Cardiac  Enzymes: No results for input(s): CKTOTAL, CKMB, CKMBINDEX, TROPONINI in the last 168 hours.  BNP (last 3 results) No results for input(s): BNP in the last 8760 hours.  ProBNP (last 3 results) No results for input(s): PROBNP in the last 8760 hours.  Radiological Exams: DG Abd 1 View  Result Date: 07/14/2020 CLINICAL DATA:  Abdominal distention EXAM: ABDOMEN - 1 VIEW COMPARISON:  06/28/2020 FINDINGS: Nonobstructive bowel gas pattern. Gas throughout nondistended large and small bowel. No organomegaly, suspicious calcification or visible free air. IMPRESSION: No acute findings. Electronically Signed   By: Rolm Baptise M.D.   On: 07/14/2020 11:20    Assessment/Plan Active Problems:   Acute on chronic respiratory failure with hypoxia (HCC)   Healthcare-associated pneumonia   Cardiac arrest (HCC)   Chronic atrial fibrillation ([place])   1. Acute on chronic respiratory failure hypoxia we will continue with weaning on T collar as tolerated.  We will need to move slowly 2. Healthcare associated pneumonia treated we will continue with supportive care. 3. Cardiac arrest rhythm is stable 4. Chronic atrial fibrillation rate is controlled   I have personally seen and evaluated the patient, evaluated laboratory and imaging results, formulated the assessment and plan and placed orders. The Patient requires high complexity decision making with multiple systems involvement.  Rounds were  done with the Respiratory Therapy Director and Staff therapists and discussed with nursing staff also.  [name], MD [facility] Pulmonary Critical Care Medicine Sleep Medicine

## 2020-07-16 DIAGNOSIS — E870 Hyperosmolality and hypernatremia: Secondary | ICD-10-CM | POA: Diagnosis not present

## 2020-07-16 DIAGNOSIS — J9621 Acute and chronic respiratory failure with hypoxia: Secondary | ICD-10-CM | POA: Diagnosis not present

## 2020-07-16 DIAGNOSIS — I482 Chronic atrial fibrillation, unspecified: Secondary | ICD-10-CM | POA: Diagnosis not present

## 2020-07-16 DIAGNOSIS — G9340 Encephalopathy, unspecified: Secondary | ICD-10-CM | POA: Diagnosis not present

## 2020-07-16 DIAGNOSIS — I469 Cardiac arrest, cause unspecified: Secondary | ICD-10-CM | POA: Diagnosis not present

## 2020-07-16 DIAGNOSIS — J9690 Respiratory failure, unspecified, unspecified whether with hypoxia or hypercapnia: Secondary | ICD-10-CM | POA: Diagnosis not present

## 2020-07-16 DIAGNOSIS — N179 Acute kidney failure, unspecified: Secondary | ICD-10-CM | POA: Diagnosis not present

## 2020-07-16 LAB — POTASSIUM: Potassium: 3.1 mmol/L — ABNORMAL LOW (ref 3.5–5.1)

## 2020-07-16 NOTE — Progress Notes (Signed)
Pulmonary Critical Care Medicine [place]   PULMONARY CRITICAL CARE SERVICE  PROGRESS NOTE  Date of Service: 07/16/2020  Daniel [name]  DPO:[id number]  DOB: Aug 31, 1940   DOA: 07/08/2020  Referring Physician: Merton Border, MD  HPI: Daniel [name] is a 80 y.o. male seen for follow up of Acute on Chronic Respiratory Failure.  Patient is afebrile right now comfortable without distress.  Remains on assist control mode on the ventilator right at this time  Medications: Reviewed on Rounds  Physical Exam:  Vitals: Temperature is 97.5 pulse 60 respiratory 20 blood pressure is 120/74 saturations 99%  Ventilator Settings on assist control FiO2 30% tidal volume 580 PEEP 5  . General: Comfortable at this time . Eyes: Grossly normal lids, irises & conjunctiva . ENT: grossly tongue is normal . Neck: no obvious mass . Cardiovascular: S1 S2 normal no gallop . Respiratory: No rhonchi very coarse breath sounds . Abdomen: soft . Skin: no rash seen on limited exam . Musculoskeletal: not rigid . Psychiatric:unable to assess . Neurologic: no seizure no involuntary movements         Lab Data:   Basic Metabolic Panel: Recent Labs  Lab 07/10/20 0404 07/11/20 0455 07/14/20 0451  NA  --  146* 146*  K 3.1* 3.5 3.3*  CL  --  111 113*  CO2  --  24 21*  GLUCOSE  --  118* 109*  BUN  --  59* 63*  CREATININE  --  1.32* 1.28*  CALCIUM  --  9.3 9.5    ABG: Recent Labs  Lab 07/11/20 1620 07/11/20 1745  PHART 7.213* 7.286*  PCO2ART 66.2* 53.9*  PO2ART 52.5* 67.9*  HCO3 25.7 24.9  O2SAT 80.7 92.4    Liver Function Tests: No results for input(s): AST, ALT, ALKPHOS, BILITOT, PROT, ALBUMIN in the last 168 hours. No results for input(s): LIPASE, AMYLASE in the last 168 hours. No results for input(s): AMMONIA in the last 168 hours.  CBC: Recent Labs  Lab 07/14/20 0451  WBC 11.1*  HGB 10.2*  HCT 35.0*  MCV 91.4  PLT 202    Cardiac Enzymes: No results for  input(s): CKTOTAL, CKMB, CKMBINDEX, TROPONINI in the last 168 hours.  BNP (last 3 results) No results for input(s): BNP in the last 8760 hours.  ProBNP (last 3 results) No results for input(s): PROBNP in the last 8760 hours.  Radiological Exams: DG Abd 1 View  Result Date: 07/14/2020 CLINICAL DATA:  Abdominal distention EXAM: ABDOMEN - 1 VIEW COMPARISON:  06/28/2020 FINDINGS: Nonobstructive bowel gas pattern. Gas throughout nondistended large and small bowel. No organomegaly, suspicious calcification or visible free air. IMPRESSION: No acute findings. Electronically Signed   By: Rolm Baptise M.D.   On: 07/14/2020 11:20    Assessment/Plan Active Problems:   Acute on chronic respiratory failure with hypoxia (HCC)   Healthcare-associated pneumonia   Cardiac arrest (HCC)   Chronic atrial fibrillation ([place])   1. Acute on chronic respiratory failure hypoxia we will continue with full support on assist control mode 2. Healthcare associated pneumonia treated 3. Cardiac arrest rhythm is stable 4. Chronic atrial fibrillation rate is controlled at this time   I have personally seen and evaluated the patient, evaluated laboratory and imaging results, formulated the assessment and plan and placed orders. The Patient requires high complexity decision making with multiple systems involvement.  Rounds were done with the Respiratory Therapy Director and Staff therapists and discussed with nursing staff also.  [name],  MD [facility] Pulmonary Critical Care Medicine Sleep Medicine

## 2020-07-17 DIAGNOSIS — I4891 Unspecified atrial fibrillation: Secondary | ICD-10-CM | POA: Diagnosis not present

## 2020-07-17 DIAGNOSIS — J9621 Acute and chronic respiratory failure with hypoxia: Secondary | ICD-10-CM | POA: Diagnosis not present

## 2020-07-17 DIAGNOSIS — I482 Chronic atrial fibrillation, unspecified: Secondary | ICD-10-CM | POA: Diagnosis not present

## 2020-07-17 DIAGNOSIS — N179 Acute kidney failure, unspecified: Secondary | ICD-10-CM | POA: Diagnosis not present

## 2020-07-17 DIAGNOSIS — I469 Cardiac arrest, cause unspecified: Secondary | ICD-10-CM | POA: Diagnosis not present

## 2020-07-17 DIAGNOSIS — J9690 Respiratory failure, unspecified, unspecified whether with hypoxia or hypercapnia: Secondary | ICD-10-CM | POA: Diagnosis not present

## 2020-07-17 DIAGNOSIS — S02651A Fracture of angle of right mandible, initial encounter for closed fracture: Secondary | ICD-10-CM | POA: Diagnosis not present

## 2020-07-17 LAB — CBC
HCT: 30.4 % — ABNORMAL LOW (ref 39.0–52.0)
Hemoglobin: 9.3 g/dL — ABNORMAL LOW (ref 13.0–17.0)
MCH: 27.8 pg (ref 26.0–34.0)
MCHC: 30.6 g/dL (ref 30.0–36.0)
MCV: 90.7 fL (ref 80.0–100.0)
Platelets: 195 10*3/uL (ref 150–400)
RBC: 3.35 MIL/uL — ABNORMAL LOW (ref 4.22–5.81)
RDW: 19 % — ABNORMAL HIGH (ref 11.5–15.5)
WBC: 8.2 10*3/uL (ref 4.0–10.5)
nRBC: 0 % (ref 0.0–0.2)

## 2020-07-17 LAB — BASIC METABOLIC PANEL
Anion gap: 11 (ref 5–15)
BUN: 49 mg/dL — ABNORMAL HIGH (ref 8–23)
CO2: 25 mmol/L (ref 22–32)
Calcium: 9.4 mg/dL (ref 8.9–10.3)
Chloride: 113 mmol/L — ABNORMAL HIGH (ref 98–111)
Creatinine, Ser: 1.33 mg/dL — ABNORMAL HIGH (ref 0.61–1.24)
GFR, Estimated: 54 mL/min — ABNORMAL LOW (ref >60)
Glucose, Bld: 124 mg/dL — ABNORMAL HIGH (ref 70–99)
Potassium: 3.4 mmol/L — ABNORMAL LOW (ref 3.5–5.1)
Sodium: 149 mmol/L — ABNORMAL HIGH (ref 135–145)

## 2020-07-17 NOTE — Progress Notes (Signed)
Pulmonary Critical Care Medicine [place]   PULMONARY CRITICAL CARE SERVICE  PROGRESS NOTE  Date of Service: 07/17/2020  Daniel [name]  ZHY:[id number]  DOB: [birth year]   DOA: 07/08/2020  Referring Physician: Merton Border, MD  HPI: Daniel [name] is a 80 y.o. male seen for follow up of Acute on Chronic Respiratory Failure.  Yesterday patient did about 8 hours of T collar today the goal is for 12 hours  Medications: Reviewed on Rounds  Physical Exam:  Vitals: Temperature is 96.7 pulse 66 respiratory rate is 20 blood pressure is 154/85 saturations 97%  Ventilator Settings currently is on assist control on 30% tidal volume 520 PEEP 5  . General: Comfortable at this time . Eyes: Grossly normal lids, irises & conjunctiva . ENT: grossly tongue is normal . Neck: no obvious mass . Cardiovascular: S1 S2 normal no gallop . Respiratory: Scattered rhonchi expansion is equal . Abdomen: soft . Skin: no rash seen on limited exam . Musculoskeletal: not rigid . Psychiatric:unable to assess . Neurologic: no seizure no involuntary movements         Lab Data:   Basic Metabolic Panel: Recent Labs  Lab 07/11/20 0455 07/14/20 0451 07/16/20 0926 07/17/20 0434  NA 146* 146*  --  149*  K 3.5 3.3* 3.1* 3.4*  CL 111 113*  --  113*  CO2 24 21*  --  25  GLUCOSE 118* 109*  --  124*  BUN 59* 63*  --  49*  CREATININE 1.32* 1.28*  --  1.33*  CALCIUM 9.3 9.5  --  9.4    ABG: Recent Labs  Lab 07/11/20 1620 07/11/20 1745  PHART 7.213* 7.286*  PCO2ART 66.2* 53.9*  PO2ART 52.5* 67.9*  HCO3 25.7 24.9  O2SAT 80.7 92.4    Liver Function Tests: No results for input(s): AST, ALT, ALKPHOS, BILITOT, PROT, ALBUMIN in the last 168 hours. No results for input(s): LIPASE, AMYLASE in the last 168 hours. No results for input(s): AMMONIA in the last 168 hours.  CBC: Recent Labs  Lab 07/14/20 0451 07/17/20 0434  WBC 11.1* 8.2  HGB 10.2* 9.3*  HCT 35.0* 30.4*  MCV 91.4  90.7  PLT 202 195    Cardiac Enzymes: No results for input(s): CKTOTAL, CKMB, CKMBINDEX, TROPONINI in the last 168 hours.  BNP (last 3 results) No results for input(s): BNP in the last 8760 hours.  ProBNP (last 3 results) No results for input(s): PROBNP in the last 8760 hours.  Radiological Exams: No results found.  Assessment/Plan Active Problems:   Acute on chronic respiratory failure with hypoxia (HCC)   Healthcare-associated pneumonia   Cardiac arrest (HCC)   Chronic atrial fibrillation ([place])   1. Acute on chronic respiratory failure hypoxia plan is to continue with wean protocol today will be 12 hours on the T collar. 2. Healthcare associated pneumonia treated improving 3. Cardiac arrest rhythm stable 4. Chronic atrial fibrillation rate is controlled   I have personally seen and evaluated the patient, evaluated laboratory and imaging results, formulated the assessment and plan and placed orders. The Patient requires high complexity decision making with multiple systems involvement.  Rounds were done with the Respiratory Therapy Director and Staff therapists and discussed with nursing staff also.  [name], MD [facility] Pulmonary Critical Care Medicine Sleep Medicine

## 2020-07-18 DIAGNOSIS — N179 Acute kidney failure, unspecified: Secondary | ICD-10-CM | POA: Diagnosis not present

## 2020-07-18 DIAGNOSIS — I4891 Unspecified atrial fibrillation: Secondary | ICD-10-CM | POA: Diagnosis not present

## 2020-07-18 DIAGNOSIS — J9690 Respiratory failure, unspecified, unspecified whether with hypoxia or hypercapnia: Secondary | ICD-10-CM | POA: Diagnosis not present

## 2020-07-18 DIAGNOSIS — S02651A Fracture of angle of right mandible, initial encounter for closed fracture: Secondary | ICD-10-CM | POA: Diagnosis not present

## 2020-07-18 LAB — BLOOD GAS, ARTERIAL
Acid-Base Excess: 0.4 mmol/L (ref 0.0–2.0)
Bicarbonate: 26 mmol/L (ref 20.0–28.0)
Drawn by: 164
FIO2: 35
O2 Saturation: 94.4 %
Patient temperature: 37
pCO2 arterial: 53.8 mmHg — ABNORMAL HIGH (ref 32.0–48.0)
pH, Arterial: 7.305 — ABNORMAL LOW (ref 7.350–7.450)
pO2, Arterial: 78.6 mmHg — ABNORMAL LOW (ref 83.0–108.0)

## 2020-07-18 LAB — POTASSIUM: Potassium: 3.1 mmol/L — ABNORMAL LOW (ref 3.5–5.1)

## 2020-07-19 ENCOUNTER — Other Ambulatory Visit (HOSPITAL_COMMUNITY): Payer: Self-pay

## 2020-07-19 DIAGNOSIS — J9690 Respiratory failure, unspecified, unspecified whether with hypoxia or hypercapnia: Secondary | ICD-10-CM | POA: Diagnosis not present

## 2020-07-19 DIAGNOSIS — E870 Hyperosmolality and hypernatremia: Secondary | ICD-10-CM | POA: Diagnosis not present

## 2020-07-19 DIAGNOSIS — J811 Chronic pulmonary edema: Secondary | ICD-10-CM | POA: Diagnosis not present

## 2020-07-19 DIAGNOSIS — J90 Pleural effusion, not elsewhere classified: Secondary | ICD-10-CM | POA: Diagnosis not present

## 2020-07-19 DIAGNOSIS — G9340 Encephalopathy, unspecified: Secondary | ICD-10-CM | POA: Diagnosis not present

## 2020-07-19 DIAGNOSIS — N179 Acute kidney failure, unspecified: Secondary | ICD-10-CM | POA: Diagnosis not present

## 2020-07-19 IMAGING — DX DG CHEST 1V PORT
1 series · 1 of 1 positions shown · non-contrast
Comparison: [DATE]

CLINICAL DATA: Pulmonary edema

EXAM:
PORTABLE CHEST 1 VIEW

[chest]
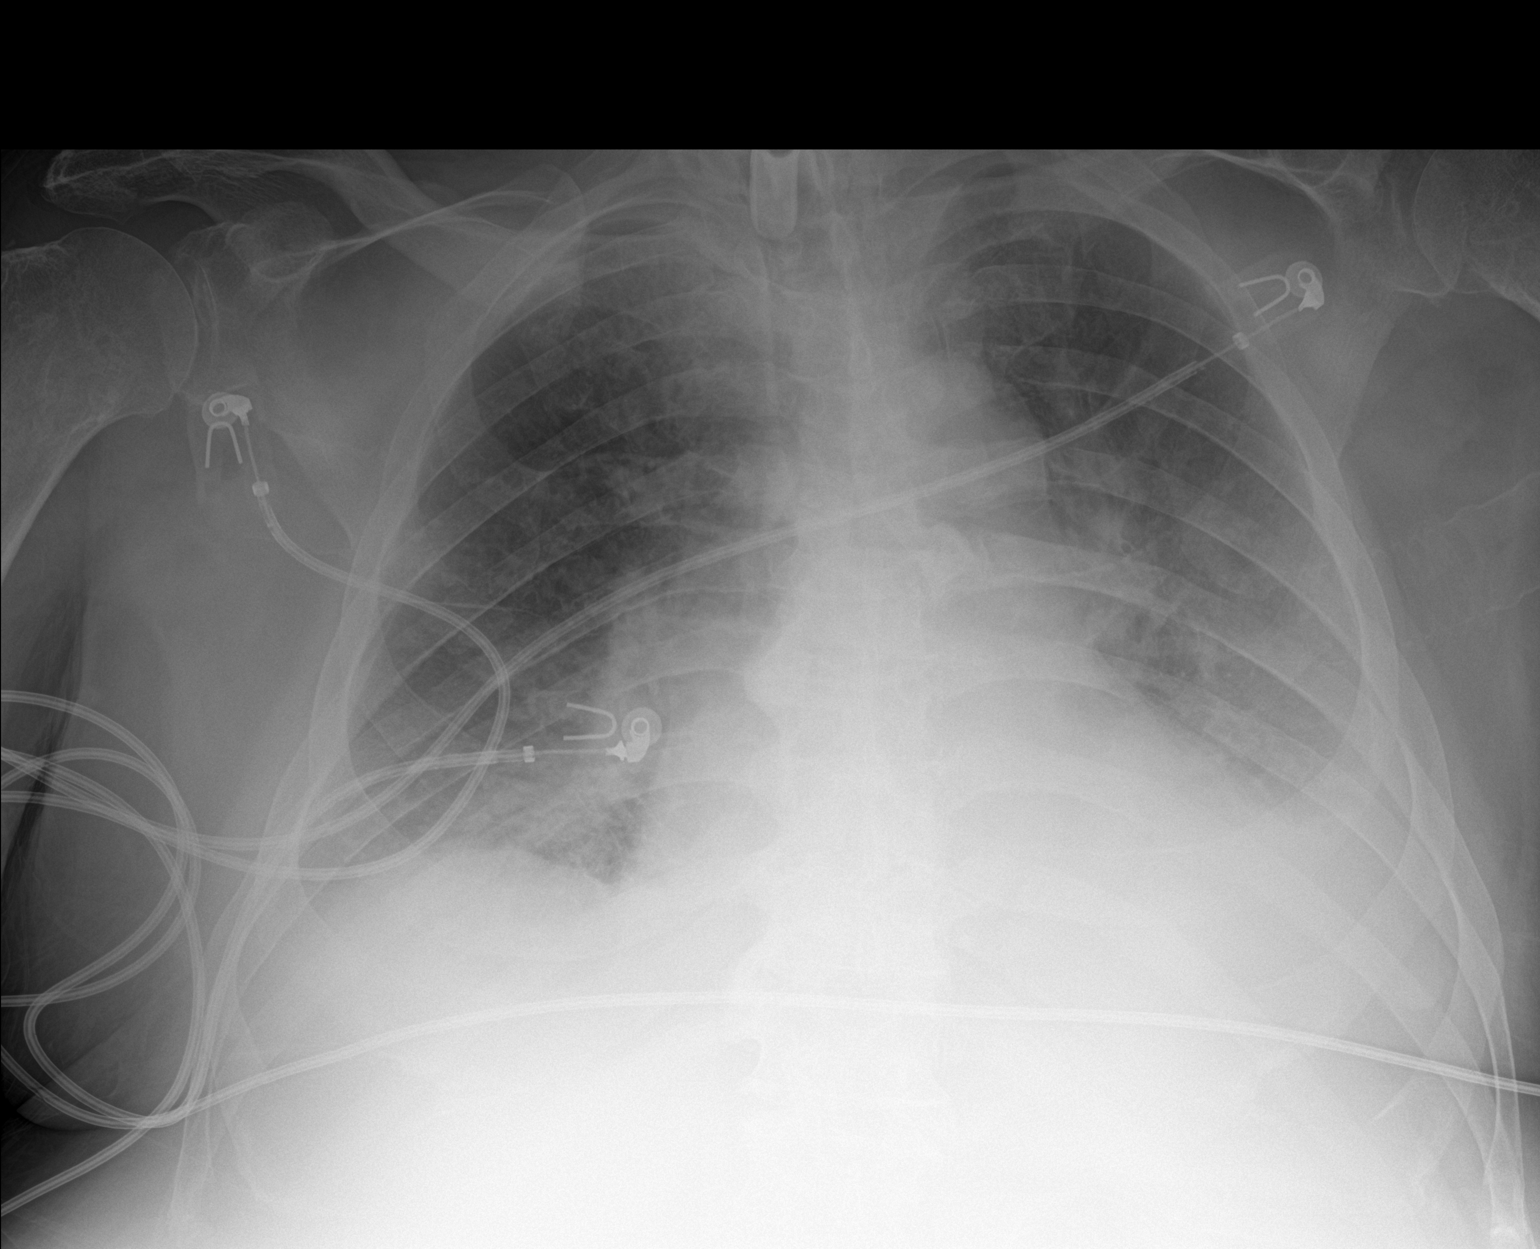

[1 of 1 positions shown; findings below may reference images not displayed]

FINDINGS: Pulmonary vascular congestion with probable mild perihilar edema not
substantially changed. Probable small bilateral pleural effusions,
left greater than right. No pneumothorax. Stable cardiomediastinal
contours. Tracheostomy device is present.
IMPRESSION: Pulmonary vascular congestion and probable mild perihilar edema
without substantial change. Probable similar small bilateral pleural
effusions, left greater than right.

## 2020-07-20 DIAGNOSIS — N179 Acute kidney failure, unspecified: Secondary | ICD-10-CM | POA: Diagnosis not present

## 2020-07-20 DIAGNOSIS — J9621 Acute and chronic respiratory failure with hypoxia: Secondary | ICD-10-CM | POA: Diagnosis not present

## 2020-07-20 DIAGNOSIS — J9690 Respiratory failure, unspecified, unspecified whether with hypoxia or hypercapnia: Secondary | ICD-10-CM | POA: Diagnosis not present

## 2020-07-20 DIAGNOSIS — I4891 Unspecified atrial fibrillation: Secondary | ICD-10-CM | POA: Diagnosis not present

## 2020-07-20 DIAGNOSIS — I482 Chronic atrial fibrillation, unspecified: Secondary | ICD-10-CM | POA: Diagnosis not present

## 2020-07-20 DIAGNOSIS — I469 Cardiac arrest, cause unspecified: Secondary | ICD-10-CM | POA: Diagnosis not present

## 2020-07-20 DIAGNOSIS — S02651A Fracture of angle of right mandible, initial encounter for closed fracture: Secondary | ICD-10-CM | POA: Diagnosis not present

## 2020-07-20 LAB — BASIC METABOLIC PANEL
Anion gap: 9 (ref 5–15)
BUN: 47 mg/dL — ABNORMAL HIGH (ref 8–23)
CO2: 25 mmol/L (ref 22–32)
Calcium: 9.9 mg/dL (ref 8.9–10.3)
Chloride: 112 mmol/L — ABNORMAL HIGH (ref 98–111)
Creatinine, Ser: 1.29 mg/dL — ABNORMAL HIGH (ref 0.61–1.24)
GFR, Estimated: 56 mL/min — ABNORMAL LOW (ref >60)
Glucose, Bld: 132 mg/dL — ABNORMAL HIGH (ref 70–99)
Potassium: 3.4 mmol/L — ABNORMAL LOW (ref 3.5–5.1)
Sodium: 146 mmol/L — ABNORMAL HIGH (ref 135–145)

## 2020-07-20 LAB — CBC
HCT: 31.7 % — ABNORMAL LOW (ref 39.0–52.0)
Hemoglobin: 9.5 g/dL — ABNORMAL LOW (ref 13.0–17.0)
MCH: 27.2 pg (ref 26.0–34.0)
MCHC: 30 g/dL (ref 30.0–36.0)
MCV: 90.8 fL (ref 80.0–100.0)
Platelets: 197 10*3/uL (ref 150–400)
RBC: 3.49 MIL/uL — ABNORMAL LOW (ref 4.22–5.81)
RDW: 19 % — ABNORMAL HIGH (ref 11.5–15.5)
WBC: 11.8 10*3/uL — ABNORMAL HIGH (ref 4.0–10.5)
nRBC: 0 % (ref 0.0–0.2)

## 2020-07-20 NOTE — Progress Notes (Signed)
Pulmonary Critical Care Medicine [place]   PULMONARY CRITICAL CARE SERVICE  PROGRESS NOTE  Date of Service: 07/20/2020  Daniel [name]  OZD:[id number]  DOB: 1940-10-12   DOA: 07/08/2020  Referring Physician: Merton Border, MD  HPI: Daniel [name] is a 80 y.o. male seen for follow up of Acute on Chronic Respiratory Failure.  Patient is on T collar goal of 12 hours  Medications: Reviewed on Rounds  Physical Exam:  Vitals: Temperature is 96.5 pulse 76 respiratory 20 blood pressure is 144/77 saturations 100%  Ventilator Settings on T collar FiO2 35%  . General: Comfortable at this time . Eyes: Grossly normal lids, irises & conjunctiva . ENT: grossly tongue is normal . Neck: no obvious mass . Cardiovascular: S1 S2 normal no gallop . Respiratory: No rhonchi very coarse breath sounds . Abdomen: soft . Skin: no rash seen on limited exam . Musculoskeletal: not rigid . Psychiatric:unable to assess . Neurologic: no seizure no involuntary movements         Lab Data:   Basic Metabolic Panel: Recent Labs  Lab 07/14/20 0451 07/16/20 0926 07/17/20 0434 07/18/20 0954 07/20/20 0455  NA 146*  --  149*  --  146*  K 3.3* 3.1* 3.4* 3.1* 3.4*  CL 113*  --  113*  --  112*  CO2 21*  --  25  --  25  GLUCOSE 109*  --  124*  --  132*  BUN 63*  --  49*  --  47*  CREATININE 1.28*  --  1.33*  --  1.29*  CALCIUM 9.5  --  9.4  --  9.9    ABG: Recent Labs  Lab 07/18/20 1415  PHART 7.305*  PCO2ART 53.8*  PO2ART 78.6*  HCO3 26.0  O2SAT 94.4    Liver Function Tests: No results for input(s): AST, ALT, ALKPHOS, BILITOT, PROT, ALBUMIN in the last 168 hours. No results for input(s): LIPASE, AMYLASE in the last 168 hours. No results for input(s): AMMONIA in the last 168 hours.  CBC: Recent Labs  Lab 07/14/20 0451 07/17/20 0434 07/20/20 0455  WBC 11.1* 8.2 11.8*  HGB 10.2* 9.3* 9.5*  HCT 35.0* 30.4* 31.7*  MCV 91.4 90.7 90.8  PLT 202 195 197    Cardiac  Enzymes: No results for input(s): CKTOTAL, CKMB, CKMBINDEX, TROPONINI in the last 168 hours.  BNP (last 3 results) No results for input(s): BNP in the last 8760 hours.  ProBNP (last 3 results) No results for input(s): PROBNP in the last 8760 hours.  Radiological Exams: DG Chest Port 1 View  Result Date: 07/19/2020 CLINICAL DATA:  Pulmonary edema EXAM: PORTABLE CHEST 1 VIEW COMPARISON:  07/12/2020 FINDINGS: Pulmonary vascular congestion with probable mild perihilar edema not substantially changed. Probable small bilateral pleural effusions, left greater than right. No pneumothorax. Stable cardiomediastinal contours. Tracheostomy device is present. IMPRESSION: Pulmonary vascular congestion and probable mild perihilar edema without substantial change. Probable similar small bilateral pleural effusions, left greater than right. Electronically Signed   By: Daniel Mis M.D.   On: 07/19/2020 11:05    Assessment/Plan Active Problems:   Acute on chronic respiratory failure with hypoxia (HCC)   Healthcare-associated pneumonia   Cardiac arrest (HCC)   Chronic atrial fibrillation ([place])   1. Acute on chronic respiratory failure hypoxia plan is going to continue with T collar titrate oxygen as tolerated continue secretion management supportive care. 2. Healthcare associated pneumonia treated clinically improving 3. Cardiac arrest rhythm stable 4. Chronic atrial fibrillation rate  is controlled at this time   I have personally seen and evaluated the patient, evaluated laboratory and imaging results, formulated the assessment and plan and placed orders. The Patient requires high complexity decision making with multiple systems involvement.  Rounds were done with the Respiratory Therapy Director and Staff therapists and discussed with nursing staff also.  [name], MD [facility] Pulmonary Critical Care Medicine Sleep Medicine

## 2020-07-21 DIAGNOSIS — J9621 Acute and chronic respiratory failure with hypoxia: Secondary | ICD-10-CM | POA: Diagnosis not present

## 2020-07-21 DIAGNOSIS — I4891 Unspecified atrial fibrillation: Secondary | ICD-10-CM | POA: Diagnosis not present

## 2020-07-21 DIAGNOSIS — E870 Hyperosmolality and hypernatremia: Secondary | ICD-10-CM | POA: Diagnosis not present

## 2020-07-21 DIAGNOSIS — N179 Acute kidney failure, unspecified: Secondary | ICD-10-CM | POA: Diagnosis not present

## 2020-07-21 DIAGNOSIS — J9690 Respiratory failure, unspecified, unspecified whether with hypoxia or hypercapnia: Secondary | ICD-10-CM | POA: Diagnosis not present

## 2020-07-21 DIAGNOSIS — I469 Cardiac arrest, cause unspecified: Secondary | ICD-10-CM | POA: Diagnosis not present

## 2020-07-21 DIAGNOSIS — I482 Chronic atrial fibrillation, unspecified: Secondary | ICD-10-CM | POA: Diagnosis not present

## 2020-07-21 NOTE — Progress Notes (Signed)
Pulmonary Critical Care Medicine [place]   PULMONARY CRITICAL CARE SERVICE  PROGRESS NOTE  Date of Service: 07/21/2020  Daniel [name]  [id number]  DOB: [birth year]   DOA: 07/08/2020  Referring Physician: Merton Border, MD  HPI: Daniel [name] is a 80 y.o. male seen for follow up of Acute on Chronic Respiratory Failure.  Patient is on T collar on 12 over  Medications: Reviewed on Rounds  Physical Exam:  Vitals: Temperature is 97 pulse 58 respiratory 20 blood pressure is 129/70 saturations 99%  Ventilator Settings of the ventilator on T collar  . General: Comfortable at this time . Eyes: Grossly normal lids, irises & conjunctiva . ENT: grossly tongue is normal . Neck: no obvious mass . Cardiovascular: S1 S2 normal no gallop . Respiratory: No rhonchi no rales.  This time. . Abdomen: soft . Skin: no rash seen on limited exam . Musculoskeletal: not rigid . Psychiatric:unable to assess . Neurologic: no seizure no involuntary movements         Lab Data:   Basic Metabolic Panel: Recent Labs  Lab 07/16/20 0926 07/17/20 0434 07/18/20 0954 07/20/20 0455  NA  --  149*  --  146*  K 3.1* 3.4* 3.1* 3.4*  CL  --  113*  --  112*  CO2  --  25  --  25  GLUCOSE  --  124*  --  132*  BUN  --  49*  --  47*  CREATININE  --  1.33*  --  1.29*  CALCIUM  --  9.4  --  9.9    ABG: Recent Labs  Lab 07/18/20 1415  PHART 7.305*  PCO2ART 53.8*  PO2ART 78.6*  HCO3 26.0  O2SAT 94.4    Liver Function Tests: No results for input(s): AST, ALT, ALKPHOS, BILITOT, PROT, ALBUMIN in the last 168 hours. No results for input(s): LIPASE, AMYLASE in the last 168 hours. No results for input(s): AMMONIA in the last 168 hours.  CBC: Recent Labs  Lab 07/17/20 0434 07/20/20 0455  WBC 8.2 11.8*  HGB 9.3* 9.5*  HCT 30.4* 31.7*  MCV 90.7 90.8  PLT 195 197    Cardiac Enzymes: No results for input(s): CKTOTAL, CKMB, CKMBINDEX, TROPONINI in the last 168  hours.  BNP (last 3 results) No results for input(s): BNP in the last 8760 hours.  ProBNP (last 3 results) No results for input(s): PROBNP in the last 8760 hours.  Radiological Exams: DG Chest Port 1 View  Result Date: 07/19/2020 CLINICAL DATA:  Pulmonary edema EXAM: PORTABLE CHEST 1 VIEW COMPARISON:  07/12/2020 FINDINGS: Pulmonary vascular congestion with probable mild perihilar edema not substantially changed. Probable small bilateral pleural effusions, left greater than right. No pneumothorax. Stable cardiomediastinal contours. Tracheostomy device is present. IMPRESSION: Pulmonary vascular congestion and probable mild perihilar edema without substantial change. Probable similar small bilateral pleural effusions, left greater than right. Electronically Signed   By: Macy Mis M.D.   On: 07/19/2020 11:05    Assessment/Plan Active Problems:   Acute on chronic respiratory failure with hypoxia (HCC)   Healthcare-associated pneumonia   Cardiac arrest (HCC)   Chronic atrial fibrillation ([place])   1. Acute on chronic respiratory failure hypoxia we will continue with the weaning on T collar to goal today 16 hours. 2. Healthcare associated pneumonia treated continue supportive care 3. Cardiac wise no changes 4. Chronic atrial fibrillation rate controlled   I have personally seen and evaluated the patient, evaluated laboratory and imaging results, formulated the  assessment and plan and placed orders. The Patient requires high complexity decision making with multiple systems involvement.  Rounds were done with the Respiratory Therapy Director and Staff therapists and discussed with nursing staff also.  Daniel Gee, MD [facility] Pulmonary Critical Care Medicine Sleep Medicine

## 2020-07-22 ENCOUNTER — Other Ambulatory Visit: Payer: Self-pay | Admitting: Internal Medicine

## 2020-07-22 DIAGNOSIS — I469 Cardiac arrest, cause unspecified: Secondary | ICD-10-CM | POA: Diagnosis not present

## 2020-07-22 DIAGNOSIS — N179 Acute kidney failure, unspecified: Secondary | ICD-10-CM | POA: Diagnosis not present

## 2020-07-22 DIAGNOSIS — I4891 Unspecified atrial fibrillation: Secondary | ICD-10-CM | POA: Diagnosis not present

## 2020-07-22 DIAGNOSIS — J9621 Acute and chronic respiratory failure with hypoxia: Secondary | ICD-10-CM | POA: Diagnosis not present

## 2020-07-22 DIAGNOSIS — E870 Hyperosmolality and hypernatremia: Secondary | ICD-10-CM | POA: Diagnosis not present

## 2020-07-22 DIAGNOSIS — J9690 Respiratory failure, unspecified, unspecified whether with hypoxia or hypercapnia: Secondary | ICD-10-CM | POA: Diagnosis not present

## 2020-07-22 DIAGNOSIS — I482 Chronic atrial fibrillation, unspecified: Secondary | ICD-10-CM | POA: Diagnosis not present

## 2020-07-22 NOTE — Progress Notes (Signed)
Pulmonary Critical Care Medicine [place]   PULMONARY CRITICAL CARE SERVICE  PROGRESS NOTE  Date of Service: 07/22/2020  ALTA SHOBER  DGU:[id number]  DOB: October 21, 1940   DOA: 07/08/2020  Referring Physician: Merton Border, MD  HPI: KERIN CECCHI is a 80 y.o. male seen for follow up of Acute on Chronic Respiratory Failure.  Patient is on the ventilator right now on assist control patient supposed to do 12 hours of T collar  Medications: Reviewed on Rounds  Physical Exam:  Vitals: Temperature is 97.6 pulse 73 respiratory 20 blood pressure 158/97 saturations 100%  Ventilator Settings on assist control FiO2 28% tidal volume 580 PEEP 5  . General: Comfortable at this time . Eyes: Grossly normal lids, irises & conjunctiva . ENT: grossly tongue is normal . Neck: no obvious mass . Cardiovascular: S1 S2 normal no gallop . Respiratory: No rhonchi very coarse breath sounds . Abdomen: soft . Skin: no rash seen on limited exam . Musculoskeletal: not rigid . Psychiatric:unable to assess . Neurologic: no seizure no involuntary movements         Lab Data:   Basic Metabolic Panel: Recent Labs  Lab 07/16/20 0926 07/17/20 0434 07/18/20 0954 07/20/20 0455  NA  --  149*  --  146*  K 3.1* 3.4* 3.1* 3.4*  CL  --  113*  --  112*  CO2  --  25  --  25  GLUCOSE  --  124*  --  132*  BUN  --  49*  --  47*  CREATININE  --  1.33*  --  1.29*  CALCIUM  --  9.4  --  9.9    ABG: Recent Labs  Lab 07/18/20 1415  PHART 7.305*  PCO2ART 53.8*  PO2ART 78.6*  HCO3 26.0  O2SAT 94.4    Liver Function Tests: No results for input(s): AST, ALT, ALKPHOS, BILITOT, PROT, ALBUMIN in the last 168 hours. No results for input(s): LIPASE, AMYLASE in the last 168 hours. No results for input(s): AMMONIA in the last 168 hours.  CBC: Recent Labs  Lab 07/17/20 0434 07/20/20 0455  WBC 8.2 11.8*  HGB 9.3* 9.5*  HCT 30.4* 31.7*  MCV 90.7 90.8  PLT 195 197    Cardiac  Enzymes: No results for input(s): CKTOTAL, CKMB, CKMBINDEX, TROPONINI in the last 168 hours.  BNP (last 3 results) No results for input(s): BNP in the last 8760 hours.  ProBNP (last 3 results) No results for input(s): PROBNP in the last 8760 hours.  Radiological Exams: No results found.  Assessment/Plan Active Problems:   Acute on chronic respiratory failure with hypoxia (HCC)   Healthcare-associated pneumonia   Cardiac arrest (HCC)   Chronic atrial fibrillation ([place])   1. Acute on chronic respiratory failure hypoxia plan is to continue with the weaning protocol on T collar for 12 hours today 2. Healthcare associated pneumonia treated improving 3. Cardiac arrest rhythm has been stable 4. Chronic atrial fibrillation rate is controlled   I have personally seen and evaluated the patient, evaluated laboratory and imaging results, formulated the assessment and plan and placed orders. The Patient requires high complexity decision making with multiple systems involvement.  Rounds were done with the Respiratory Therapy Director and Staff therapists and discussed with nursing staff also.  [name], MD [facility] Pulmonary Critical Care Medicine Sleep Medicine

## 2020-07-23 ENCOUNTER — Other Ambulatory Visit (HOSPITAL_COMMUNITY): Payer: Self-pay

## 2020-07-23 ENCOUNTER — Other Ambulatory Visit: Payer: Self-pay | Admitting: Internal Medicine

## 2020-07-23 DIAGNOSIS — J9690 Respiratory failure, unspecified, unspecified whether with hypoxia or hypercapnia: Secondary | ICD-10-CM | POA: Diagnosis not present

## 2020-07-23 DIAGNOSIS — J811 Chronic pulmonary edema: Secondary | ICD-10-CM | POA: Diagnosis not present

## 2020-07-23 DIAGNOSIS — I469 Cardiac arrest, cause unspecified: Secondary | ICD-10-CM | POA: Diagnosis not present

## 2020-07-23 DIAGNOSIS — I482 Chronic atrial fibrillation, unspecified: Secondary | ICD-10-CM | POA: Diagnosis not present

## 2020-07-23 DIAGNOSIS — I4891 Unspecified atrial fibrillation: Secondary | ICD-10-CM | POA: Diagnosis not present

## 2020-07-23 DIAGNOSIS — J9811 Atelectasis: Secondary | ICD-10-CM | POA: Diagnosis not present

## 2020-07-23 DIAGNOSIS — J90 Pleural effusion, not elsewhere classified: Secondary | ICD-10-CM | POA: Diagnosis not present

## 2020-07-23 DIAGNOSIS — J9621 Acute and chronic respiratory failure with hypoxia: Secondary | ICD-10-CM | POA: Diagnosis not present

## 2020-07-23 DIAGNOSIS — E870 Hyperosmolality and hypernatremia: Secondary | ICD-10-CM | POA: Diagnosis not present

## 2020-07-23 DIAGNOSIS — N179 Acute kidney failure, unspecified: Secondary | ICD-10-CM | POA: Diagnosis not present

## 2020-07-23 LAB — CBC
HCT: 32.6 % — ABNORMAL LOW (ref 39.0–52.0)
Hemoglobin: 9.8 g/dL — ABNORMAL LOW (ref 13.0–17.0)
MCH: 27 pg (ref 26.0–34.0)
MCHC: 30.1 g/dL (ref 30.0–36.0)
MCV: 89.8 fL (ref 80.0–100.0)
Platelets: 209 10*3/uL (ref 150–400)
RBC: 3.63 MIL/uL — ABNORMAL LOW (ref 4.22–5.81)
RDW: 18.8 % — ABNORMAL HIGH (ref 11.5–15.5)
WBC: 11.4 10*3/uL — ABNORMAL HIGH (ref 4.0–10.5)
nRBC: 0 % (ref 0.0–0.2)

## 2020-07-23 LAB — BASIC METABOLIC PANEL
Anion gap: 7 (ref 5–15)
BUN: 54 mg/dL — ABNORMAL HIGH (ref 8–23)
CO2: 30 mmol/L (ref 22–32)
Calcium: 9.5 mg/dL (ref 8.9–10.3)
Chloride: 109 mmol/L (ref 98–111)
Creatinine, Ser: 1.43 mg/dL — ABNORMAL HIGH (ref 0.61–1.24)
GFR, Estimated: 50 mL/min — ABNORMAL LOW (ref >60)
Glucose, Bld: 112 mg/dL — ABNORMAL HIGH (ref 70–99)
Potassium: 3.7 mmol/L (ref 3.5–5.1)
Sodium: 146 mmol/L — ABNORMAL HIGH (ref 135–145)

## 2020-07-23 LAB — MAGNESIUM: Magnesium: 2.4 mg/dL (ref 1.7–2.4)

## 2020-07-23 IMAGING — DX DG CHEST 1V PORT
1 series · 1 of 1 positions shown · non-contrast
Comparison: [DATE]

CLINICAL DATA: Respiratory failure, pleural effusion

EXAM:
PORTABLE CHEST 1 VIEW

[chest ap]
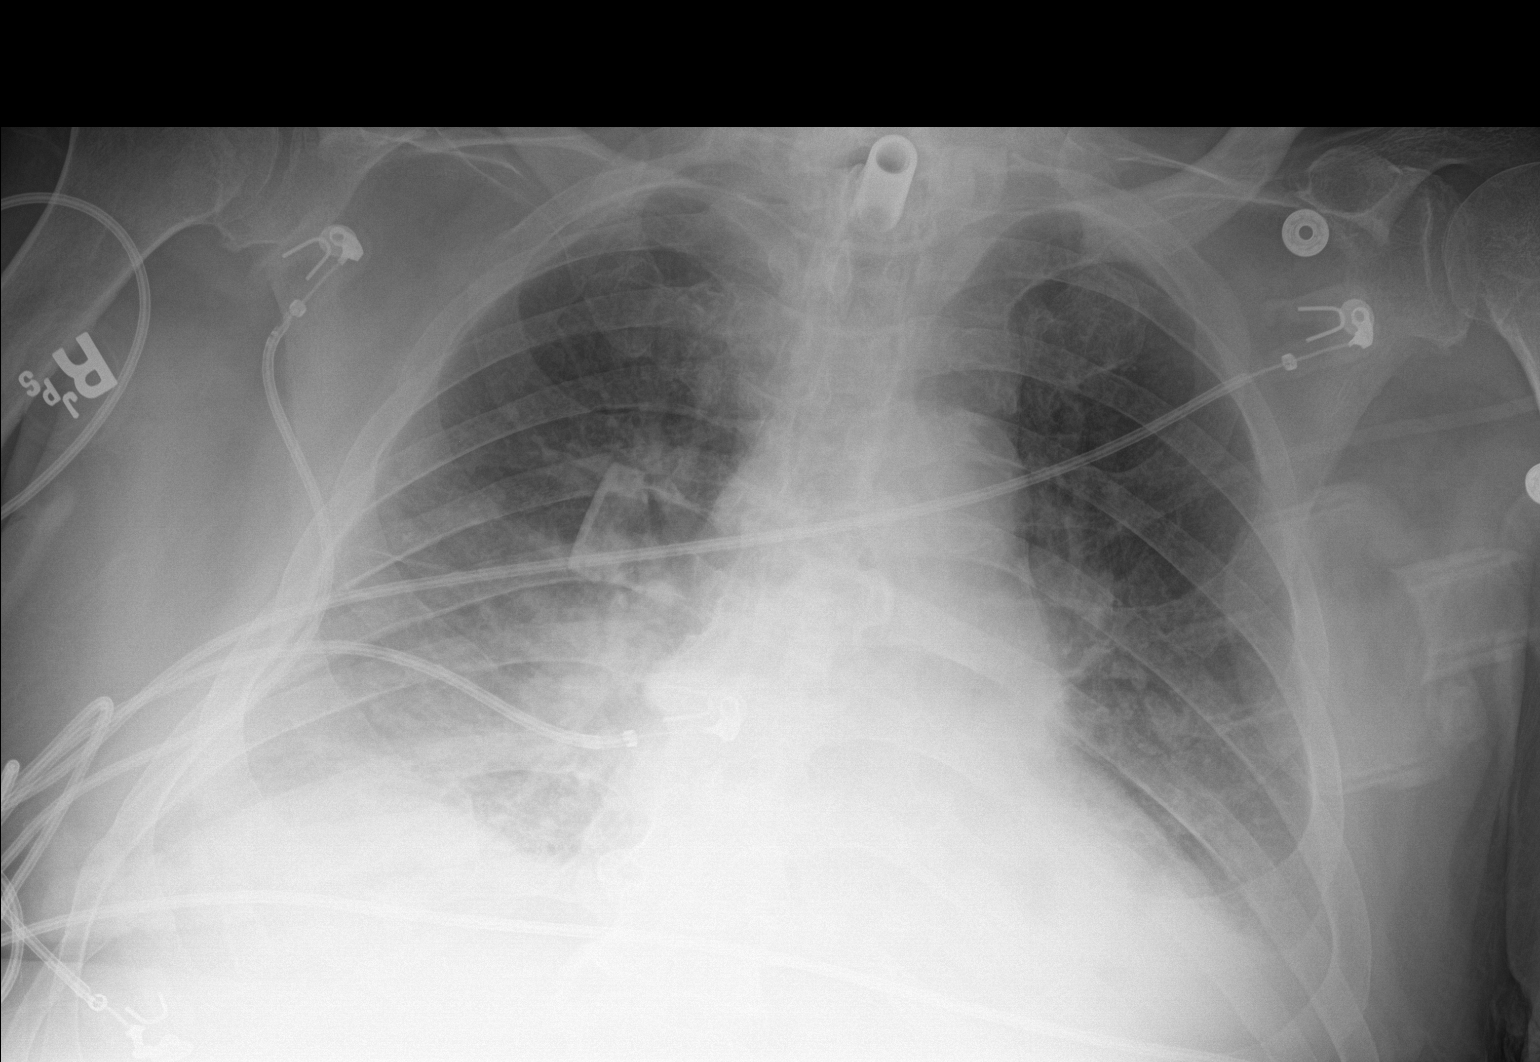

[1 of 1 positions shown; findings below may reference images not displayed]

FINDINGS: Tracheostomy again noted. Pulmonary insufflation is stable. Small
bilateral pleural effusions are again seen. Superimposed bibasilar
atelectasis again noted. Mild bilateral perihilar pulmonary edema
persists, stable since prior examination. Cardiac size is at the
upper limits of normal. No pneumothorax.
IMPRESSION: Stable mild to moderate cardiogenic failure

## 2020-07-23 NOTE — Progress Notes (Signed)
Pulmonary Critical Care Medicine [place]   PULMONARY CRITICAL CARE SERVICE  PROGRESS NOTE  Date of Service: 07/23/2020  Daniel MEIKLE  [name]:[id number]  DOB: May 16, 1940   DOA: 07/08/2020  Referring Physician: Merton Border, MD  HPI: Daniel [name] is a 80 y.o. male seen for follow up of Acute on Chronic Respiratory Failure.  Patient is on T collar wean on 20% FiO2 the goal is for 16 hours  Medications: Reviewed on Rounds  Physical Exam:  Vitals: Temperature is 97.2 pulse 80 respiratory rate 20 blood pressure is 152/82 saturations 100%  Ventilator Settings off ventilator on T collar wean  . General: Comfortable at this time . Eyes: Grossly normal lids, irises & conjunctiva . ENT: grossly tongue is normal . Neck: no obvious mass . Cardiovascular: S1 S2 normal no gallop . Respiratory: No rhonchi coarse breath sound . Abdomen: soft . Skin: no rash seen on limited exam . Musculoskeletal: not rigid . Psychiatric:unable to assess . Neurologic: no seizure no involuntary movements         Lab Data:   Basic Metabolic Panel: Recent Labs  Lab 07/16/20 0926 07/17/20 0434 07/18/20 0954 07/20/20 0455 07/23/20 0329  NA  --  149*  --  146* 146*  K 3.1* 3.4* 3.1* 3.4* 3.7  CL  --  113*  --  112* 109  CO2  --  25  --  25 30  GLUCOSE  --  124*  --  132* 112*  BUN  --  49*  --  47* 54*  CREATININE  --  1.33*  --  1.29* 1.43*  CALCIUM  --  9.4  --  9.9 9.5  MG  --   --   --   --  2.4    ABG: Recent Labs  Lab 07/18/20 1415  PHART 7.305*  PCO2ART 53.8*  PO2ART 78.6*  HCO3 26.0  O2SAT 94.4    Liver Function Tests: No results for input(s): AST, ALT, ALKPHOS, BILITOT, PROT, ALBUMIN in the last 168 hours. No results for input(s): LIPASE, AMYLASE in the last 168 hours. No results for input(s): AMMONIA in the last 168 hours.  CBC: Recent Labs  Lab 07/17/20 0434 07/20/20 0455 07/23/20 0329  WBC 8.2 11.8* 11.4*  HGB 9.3* 9.5* 9.8*  HCT 30.4*  31.7* 32.6*  MCV 90.7 90.8 89.8  PLT 195 197 209    Cardiac Enzymes: No results for input(s): CKTOTAL, CKMB, CKMBINDEX, TROPONINI in the last 168 hours.  BNP (last 3 results) No results for input(s): BNP in the last 8760 hours.  ProBNP (last 3 results) No results for input(s): PROBNP in the last 8760 hours.  Radiological Exams: DG CHEST PORT 1 VIEW  Result Date: 07/23/2020 CLINICAL DATA:  Respiratory failure, pleural effusion EXAM: PORTABLE CHEST 1 VIEW COMPARISON:  07/19/2020 FINDINGS: Tracheostomy again noted. Pulmonary insufflation is stable. Small bilateral pleural effusions are again seen. Superimposed bibasilar atelectasis again noted. Mild bilateral perihilar pulmonary edema persists, stable since prior examination. Cardiac size is at the upper limits of normal. No pneumothorax. IMPRESSION: Stable mild to moderate cardiogenic failure Electronically Signed   By: Fidela Salisbury MD   On: 07/23/2020 06:24    Assessment/Plan Active Problems:   Acute on chronic respiratory failure with hypoxia (HCC)   Healthcare-associated pneumonia   Cardiac arrest ([place])   Chronic atrial fibrillation ([place])   1. Acute on chronic respiratory failure hypoxia we will continue with T-piece patient is on 20% FiO2.  Continue secretion management  supportive care. 2. Healthcare associated pneumonia treated slow improvement 3. Cardiac arrest rhythm stable 4. Chronic atrial fibrillation rate is controlled   I have personally seen and evaluated the patient, evaluated laboratory and imaging results, formulated the assessment and plan and placed orders. The Patient requires high complexity decision making with multiple systems involvement.  Rounds were done with the Respiratory Therapy Director and Staff therapists and discussed with nursing staff also.  [name], MD [facility] Pulmonary Critical Care Medicine Sleep Medicine

## 2020-07-24 DIAGNOSIS — J9621 Acute and chronic respiratory failure with hypoxia: Secondary | ICD-10-CM | POA: Diagnosis not present

## 2020-07-24 DIAGNOSIS — E870 Hyperosmolality and hypernatremia: Secondary | ICD-10-CM | POA: Diagnosis not present

## 2020-07-24 DIAGNOSIS — I4891 Unspecified atrial fibrillation: Secondary | ICD-10-CM | POA: Diagnosis not present

## 2020-07-24 DIAGNOSIS — I469 Cardiac arrest, cause unspecified: Secondary | ICD-10-CM | POA: Diagnosis not present

## 2020-07-24 DIAGNOSIS — N179 Acute kidney failure, unspecified: Secondary | ICD-10-CM | POA: Diagnosis not present

## 2020-07-24 DIAGNOSIS — I482 Chronic atrial fibrillation, unspecified: Secondary | ICD-10-CM | POA: Diagnosis not present

## 2020-07-24 DIAGNOSIS — J9690 Respiratory failure, unspecified, unspecified whether with hypoxia or hypercapnia: Secondary | ICD-10-CM | POA: Diagnosis not present

## 2020-07-24 NOTE — Progress Notes (Signed)
Pulmonary Critical Care Medicine [place]   PULMONARY CRITICAL CARE SERVICE  PROGRESS NOTE  Date of Service: 07/24/2020  Daniel [name]  [id number]  DOB: May 09, 1941   DOA: 07/08/2020  Referring Physician: Merton Border, MD  HPI: Daniel [name] is a 80 y.o. male seen for follow up of Acute on Chronic Respiratory Failure.  Patient currently is on T collar on 20% FiO2 goal today is for 20 hours  Medications: Reviewed on Rounds  Physical Exam:  Vitals: Temperature is 97.0 pulse 83 respiratory rate 20 blood pressure is 122/66 saturations 97%  Ventilator Settings on T collar FiO2 28%  . General: Comfortable at this time . Eyes: Grossly normal lids, irises & conjunctiva . ENT: grossly tongue is normal . Neck: no obvious mass . Cardiovascular: S1 S2 normal no gallop . Respiratory: Scattered rhonchi expansion is equal . Abdomen: soft . Skin: no rash seen on limited exam . Musculoskeletal: not rigid . Psychiatric:unable to assess . Neurologic: no seizure no involuntary movements         Lab Data:   Basic Metabolic Panel: Recent Labs  Lab 07/18/20 0954 07/20/20 0455 07/23/20 0329  NA  --  146* 146*  K 3.1* 3.4* 3.7  CL  --  112* 109  CO2  --  25 30  GLUCOSE  --  132* 112*  BUN  --  47* 54*  CREATININE  --  1.29* 1.43*  CALCIUM  --  9.9 9.5  MG  --   --  2.4    ABG: Recent Labs  Lab 07/18/20 1415  PHART 7.305*  PCO2ART 53.8*  PO2ART 78.6*  HCO3 26.0  O2SAT 94.4    Liver Function Tests: No results for input(s): AST, ALT, ALKPHOS, BILITOT, PROT, ALBUMIN in the last 168 hours. No results for input(s): LIPASE, AMYLASE in the last 168 hours. No results for input(s): AMMONIA in the last 168 hours.  CBC: Recent Labs  Lab 07/20/20 0455 07/23/20 0329  WBC 11.8* 11.4*  HGB 9.5* 9.8*  HCT 31.7* 32.6*  MCV 90.8 89.8  PLT 197 209    Cardiac Enzymes: No results for input(s): CKTOTAL, CKMB, CKMBINDEX, TROPONINI in the last 168  hours.  BNP (last 3 results) No results for input(s): BNP in the last 8760 hours.  ProBNP (last 3 results) No results for input(s): PROBNP in the last 8760 hours.  Radiological Exams: DG CHEST PORT 1 VIEW  Result Date: 07/23/2020 CLINICAL DATA:  Respiratory failure, pleural effusion EXAM: PORTABLE CHEST 1 VIEW COMPARISON:  07/19/2020 FINDINGS: Tracheostomy again noted. Pulmonary insufflation is stable. Small bilateral pleural effusions are again seen. Superimposed bibasilar atelectasis again noted. Mild bilateral perihilar pulmonary edema persists, stable since prior examination. Cardiac size is at the upper limits of normal. No pneumothorax. IMPRESSION: Stable mild to moderate cardiogenic failure Electronically Signed   By: Fidela Salisbury MD   On: 07/23/2020 06:24    Assessment/Plan Active Problems:   Acute on chronic respiratory failure with hypoxia (HCC)   Healthcare-associated pneumonia   Cardiac arrest ([place])   Chronic atrial fibrillation ([place])   1. Acute on chronic respiratory failure hypoxia we will continue with the T-piece wean for 20 hours. 2. Healthcare associated pneumonia treated slowly improving 3. Cardiac arrest rhythm is stable 4. Chronic atrial fibrillation rate is controlled at this   I have personally seen and evaluated the patient, evaluated laboratory and imaging results, formulated the assessment and plan and placed orders. The Patient requires high complexity decision making  with multiple systems involvement.  Rounds were done with the Respiratory Therapy Director and Staff therapists and discussed with nursing staff also.  [name], MD [facility] Pulmonary Critical Care Medicine Sleep Medicine

## 2020-07-25 DIAGNOSIS — E870 Hyperosmolality and hypernatremia: Secondary | ICD-10-CM | POA: Diagnosis not present

## 2020-07-25 DIAGNOSIS — I4891 Unspecified atrial fibrillation: Secondary | ICD-10-CM | POA: Diagnosis not present

## 2020-07-25 DIAGNOSIS — N179 Acute kidney failure, unspecified: Secondary | ICD-10-CM | POA: Diagnosis not present

## 2020-07-25 DIAGNOSIS — J9690 Respiratory failure, unspecified, unspecified whether with hypoxia or hypercapnia: Secondary | ICD-10-CM | POA: Diagnosis not present

## 2020-07-26 DIAGNOSIS — J9621 Acute and chronic respiratory failure with hypoxia: Secondary | ICD-10-CM | POA: Diagnosis not present

## 2020-07-26 DIAGNOSIS — I4891 Unspecified atrial fibrillation: Secondary | ICD-10-CM | POA: Diagnosis not present

## 2020-07-26 DIAGNOSIS — J9690 Respiratory failure, unspecified, unspecified whether with hypoxia or hypercapnia: Secondary | ICD-10-CM | POA: Diagnosis not present

## 2020-07-26 DIAGNOSIS — S02651A Fracture of angle of right mandible, initial encounter for closed fracture: Secondary | ICD-10-CM | POA: Diagnosis not present

## 2020-07-26 DIAGNOSIS — N179 Acute kidney failure, unspecified: Secondary | ICD-10-CM | POA: Diagnosis not present

## 2020-07-26 DIAGNOSIS — I469 Cardiac arrest, cause unspecified: Secondary | ICD-10-CM | POA: Diagnosis not present

## 2020-07-26 DIAGNOSIS — I482 Chronic atrial fibrillation, unspecified: Secondary | ICD-10-CM | POA: Diagnosis not present

## 2020-07-26 LAB — BASIC METABOLIC PANEL
Anion gap: 9 (ref 5–15)
BUN: 65 mg/dL — ABNORMAL HIGH (ref 8–23)
CO2: 35 mmol/L — ABNORMAL HIGH (ref 22–32)
Calcium: 9.5 mg/dL (ref 8.9–10.3)
Chloride: 110 mmol/L (ref 98–111)
Creatinine, Ser: 1.7 mg/dL — ABNORMAL HIGH (ref 0.61–1.24)
GFR, Estimated: 41 mL/min — ABNORMAL LOW (ref >60)
Glucose, Bld: 156 mg/dL — ABNORMAL HIGH (ref 70–99)
Potassium: 4.6 mmol/L (ref 3.5–5.1)
Sodium: 154 mmol/L — ABNORMAL HIGH (ref 135–145)

## 2020-07-26 LAB — BLOOD GAS, ARTERIAL
Acid-Base Excess: 8.1 mmol/L — ABNORMAL HIGH (ref 0.0–2.0)
Bicarbonate: 34.2 mmol/L — ABNORMAL HIGH (ref 20.0–28.0)
FIO2: 35
O2 Saturation: 94.9 %
Patient temperature: 37
pCO2 arterial: 69.4 mmHg (ref 32.0–48.0)
pH, Arterial: 7.313 — ABNORMAL LOW (ref 7.350–7.450)
pO2, Arterial: 79.4 mmHg — ABNORMAL LOW (ref 83.0–108.0)

## 2020-07-26 NOTE — Progress Notes (Signed)
Pulmonary Critical Care Medicine [place]   PULMONARY CRITICAL CARE SERVICE  PROGRESS NOTE  Date of Service: 07/26/2020  Daniel [name]  [id number]  DOB: March 23, 1941   DOA: 07/08/2020  Referring Physician: Merton Border, MD  HPI: Daniel [name] is a 80 y.o. male seen for follow up of Acute on Chronic Respiratory Failure.  Patient currently is on T collar goal of 24 hours today  Medications: Reviewed on Rounds  Physical Exam:  Vitals: Temperature is 96.4 pulse 95 respiratory 12 blood pressure is 151/93 saturations 97%  Ventilator Settings on T collar FiO2 of 35%  . General: Comfortable at this time . Eyes: Grossly normal lids, irises & conjunctiva . ENT: grossly tongue is normal . Neck: no obvious mass . Cardiovascular: S1 S2 normal no gallop . Respiratory: No rhonchi no rales are noted at this time . Abdomen: soft . Skin: no rash seen on limited exam . Musculoskeletal: not rigid . Psychiatric:unable to assess . Neurologic: no seizure no involuntary movements         Lab Data:   Basic Metabolic Panel: Recent Labs  Lab 07/20/20 0455 07/23/20 0329  NA 146* 146*  K 3.4* 3.7  CL 112* 109  CO2 25 30  GLUCOSE 132* 112*  BUN 47* 54*  CREATININE 1.29* 1.43*  CALCIUM 9.9 9.5  MG  --  2.4    ABG: Recent Labs  Lab 07/26/20 1002  PHART 7.313*  PCO2ART 69.4*  PO2ART 79.4*  HCO3 34.2*  O2SAT 94.9    Liver Function Tests: No results for input(s): AST, ALT, ALKPHOS, BILITOT, PROT, ALBUMIN in the last 168 hours. No results for input(s): LIPASE, AMYLASE in the last 168 hours. No results for input(s): AMMONIA in the last 168 hours.  CBC: Recent Labs  Lab 07/20/20 0455 07/23/20 0329  WBC 11.8* 11.4*  HGB 9.5* 9.8*  HCT 31.7* 32.6*  MCV 90.8 89.8  PLT 197 209    Cardiac Enzymes: No results for input(s): CKTOTAL, CKMB, CKMBINDEX, TROPONINI in the last 168 hours.  BNP (last 3 results) No results for input(s): BNP in the last 8760  hours.  ProBNP (last 3 results) No results for input(s): PROBNP in the last 8760 hours.  Radiological Exams: No results found.  Assessment/Plan Active Problems:   Acute on chronic respiratory failure with hypoxia (HCC)   Healthcare-associated pneumonia   Cardiac arrest (HCC)   Chronic atrial fibrillation ([place])   1. Acute on chronic respiratory failure hypoxia plan is going to be to continue with the wean on T collar goal of 24 hours. 2. Healthcare associated pneumonia treated 3. Cardiac arrest rhythm is stable 4. Chronic atrial fibrillation rate is controlled we will continue to monitor   I have personally seen and evaluated the patient, evaluated laboratory and imaging results, formulated the assessment and plan and placed orders. The Patient requires high complexity decision making with multiple systems involvement.  Rounds were done with the Respiratory Therapy Director and Staff therapists and discussed with nursing staff also.  [name], MD [facility] Pulmonary Critical Care Medicine Sleep Medicine

## 2020-07-27 DIAGNOSIS — I482 Chronic atrial fibrillation, unspecified: Secondary | ICD-10-CM | POA: Diagnosis not present

## 2020-07-27 DIAGNOSIS — S02651A Fracture of angle of right mandible, initial encounter for closed fracture: Secondary | ICD-10-CM | POA: Diagnosis not present

## 2020-07-27 DIAGNOSIS — N179 Acute kidney failure, unspecified: Secondary | ICD-10-CM | POA: Diagnosis not present

## 2020-07-27 DIAGNOSIS — J9690 Respiratory failure, unspecified, unspecified whether with hypoxia or hypercapnia: Secondary | ICD-10-CM | POA: Diagnosis not present

## 2020-07-27 DIAGNOSIS — I4891 Unspecified atrial fibrillation: Secondary | ICD-10-CM | POA: Diagnosis not present

## 2020-07-27 DIAGNOSIS — J9621 Acute and chronic respiratory failure with hypoxia: Secondary | ICD-10-CM | POA: Diagnosis not present

## 2020-07-27 DIAGNOSIS — I469 Cardiac arrest, cause unspecified: Secondary | ICD-10-CM | POA: Diagnosis not present

## 2020-07-27 LAB — BASIC METABOLIC PANEL
Anion gap: 8 (ref 5–15)
BUN: 69 mg/dL — ABNORMAL HIGH (ref 8–23)
CO2: 32 mmol/L (ref 22–32)
Calcium: 9.2 mg/dL (ref 8.9–10.3)
Chloride: 109 mmol/L (ref 98–111)
Creatinine, Ser: 1.79 mg/dL — ABNORMAL HIGH (ref 0.61–1.24)
GFR, Estimated: 38 mL/min — ABNORMAL LOW (ref >60)
Glucose, Bld: 164 mg/dL — ABNORMAL HIGH (ref 70–99)
Potassium: 5 mmol/L (ref 3.5–5.1)
Sodium: 149 mmol/L — ABNORMAL HIGH (ref 135–145)

## 2020-07-27 NOTE — Progress Notes (Signed)
Pulmonary Critical Care Medicine [place]   PULMONARY CRITICAL CARE SERVICE  PROGRESS NOTE  Date of Service: 07/27/2020  Daniel [name]  [id number]  DOB: [birth year]   DOA: 07/08/2020  Referring Physician: Merton Border, MD  HPI: Daniel [name] is a 80 y.o. male seen for follow up of Acute on Chronic Respiratory Failure.  Patient at this time is comfortable without distress at this time has been on T collar completed 48 hours  Medications: Reviewed on Rounds  Physical Exam:  Vitals: Temperature is 97.9 pulse 103 respiratory 16 blood pressure is 138/86 saturations 96%  Ventilator Settings on T collar FiO2 35%  . General: Comfortable at this time . Eyes: Grossly normal lids, irises & conjunctiva . ENT: grossly tongue is normal . Neck: no obvious mass . Cardiovascular: S1 S2 normal no gallop . Respiratory: No rhonchi no rales are noted at this time . Abdomen: soft . Skin: no rash seen on limited exam . Musculoskeletal: not rigid . Psychiatric:unable to assess . Neurologic: no seizure no involuntary movements         Lab Data:   Basic Metabolic Panel: Recent Labs  Lab 07/23/20 0329 07/26/20 1123 07/27/20 0413  NA 146* 154* 149*  K 3.7 4.6 5.0  CL 109 110 109  CO2 30 35* 32  GLUCOSE 112* 156* 164*  BUN 54* 65* 69*  CREATININE 1.43* 1.70* 1.79*  CALCIUM 9.5 9.5 9.2  MG 2.4  --   --     ABG: Recent Labs  Lab 07/26/20 1002  PHART 7.313*  PCO2ART 69.4*  PO2ART 79.4*  HCO3 34.2*  O2SAT 94.9    Liver Function Tests: No results for input(s): AST, ALT, ALKPHOS, BILITOT, PROT, ALBUMIN in the last 168 hours. No results for input(s): LIPASE, AMYLASE in the last 168 hours. No results for input(s): AMMONIA in the last 168 hours.  CBC: Recent Labs  Lab 07/23/20 0329  WBC 11.4*  HGB 9.8*  HCT 32.6*  MCV 89.8  PLT 209    Cardiac Enzymes: No results for input(s): CKTOTAL, CKMB, CKMBINDEX, TROPONINI in the last 168 hours.  BNP  (last 3 results) No results for input(s): BNP in the last 8760 hours.  ProBNP (last 3 results) No results for input(s): PROBNP in the last 8760 hours.  Radiological Exams: No results found.  Assessment/Plan Active Problems:   Acute on chronic respiratory failure with hypoxia (HCC)   Healthcare-associated pneumonia   Cardiac arrest (HCC)   Chronic atrial fibrillation ([place])   1. Acute on chronic respiratory failure with hypoxia plan is to continue with T-piece titrate oxygen as tolerated patient remains requiring 35% FiO2. 2. Healthcare associated pneumonia treated slow improvement we will continue to follow 3. Cardiac arrest rhythm is stable 4. Chronic atrial fibrillation rate is controlled at this time   I have personally seen and evaluated the patient, evaluated laboratory and imaging results, formulated the assessment and plan and placed orders. The Patient requires high complexity decision making with multiple systems involvement.  Rounds were done with the Respiratory Therapy Director and Staff therapists and discussed with nursing staff also.  [name], MD [facility] Pulmonary Critical Care Medicine Sleep Medicine

## 2020-07-28 ENCOUNTER — Other Ambulatory Visit (HOSPITAL_COMMUNITY): Payer: Self-pay

## 2020-07-28 DIAGNOSIS — N179 Acute kidney failure, unspecified: Secondary | ICD-10-CM | POA: Diagnosis not present

## 2020-07-28 DIAGNOSIS — S02651A Fracture of angle of right mandible, initial encounter for closed fracture: Secondary | ICD-10-CM | POA: Diagnosis not present

## 2020-07-28 DIAGNOSIS — R0902 Hypoxemia: Secondary | ICD-10-CM | POA: Diagnosis not present

## 2020-07-28 DIAGNOSIS — I482 Chronic atrial fibrillation, unspecified: Secondary | ICD-10-CM | POA: Diagnosis not present

## 2020-07-28 DIAGNOSIS — I517 Cardiomegaly: Secondary | ICD-10-CM | POA: Diagnosis not present

## 2020-07-28 DIAGNOSIS — I469 Cardiac arrest, cause unspecified: Secondary | ICD-10-CM | POA: Diagnosis not present

## 2020-07-28 DIAGNOSIS — I4891 Unspecified atrial fibrillation: Secondary | ICD-10-CM | POA: Diagnosis not present

## 2020-07-28 DIAGNOSIS — J9690 Respiratory failure, unspecified, unspecified whether with hypoxia or hypercapnia: Secondary | ICD-10-CM | POA: Diagnosis not present

## 2020-07-28 DIAGNOSIS — J90 Pleural effusion, not elsewhere classified: Secondary | ICD-10-CM | POA: Diagnosis not present

## 2020-07-28 DIAGNOSIS — J9621 Acute and chronic respiratory failure with hypoxia: Secondary | ICD-10-CM | POA: Diagnosis not present

## 2020-07-28 LAB — BLOOD GAS, ARTERIAL
Acid-Base Excess: 8.1 mmol/L — ABNORMAL HIGH (ref 0.0–2.0)
Acid-Base Excess: 8.7 mmol/L — ABNORMAL HIGH (ref 0.0–2.0)
Bicarbonate: 34.6 mmol/L — ABNORMAL HIGH (ref 20.0–28.0)
Bicarbonate: 33.3 mmol/L — ABNORMAL HIGH (ref 20.0–28.0)
FIO2: 40
FIO2: 28
O2 Saturation: 90 %
O2 Saturation: 93.1 %
Patient temperature: 36.9
Patient temperature: 37
pCO2 arterial: 74.3 mmHg (ref 32.0–48.0)
pCO2 arterial: 51.6 mmHg — ABNORMAL HIGH (ref 32.0–48.0)
pH, Arterial: 7.29 — ABNORMAL LOW (ref 7.350–7.450)
pH, Arterial: 7.425 (ref 7.350–7.450)
pO2, Arterial: 62.1 mmHg — ABNORMAL LOW (ref 83.0–108.0)
pO2, Arterial: 61.6 mmHg — ABNORMAL LOW (ref 83.0–108.0)

## 2020-07-28 IMAGING — DX DG CHEST 1V PORT
1 series · 1 of 1 positions shown · non-contrast
Comparison: [DATE]

CLINICAL DATA: Hypoxia

EXAM:
PORTABLE CHEST 1 VIEW

[chest]
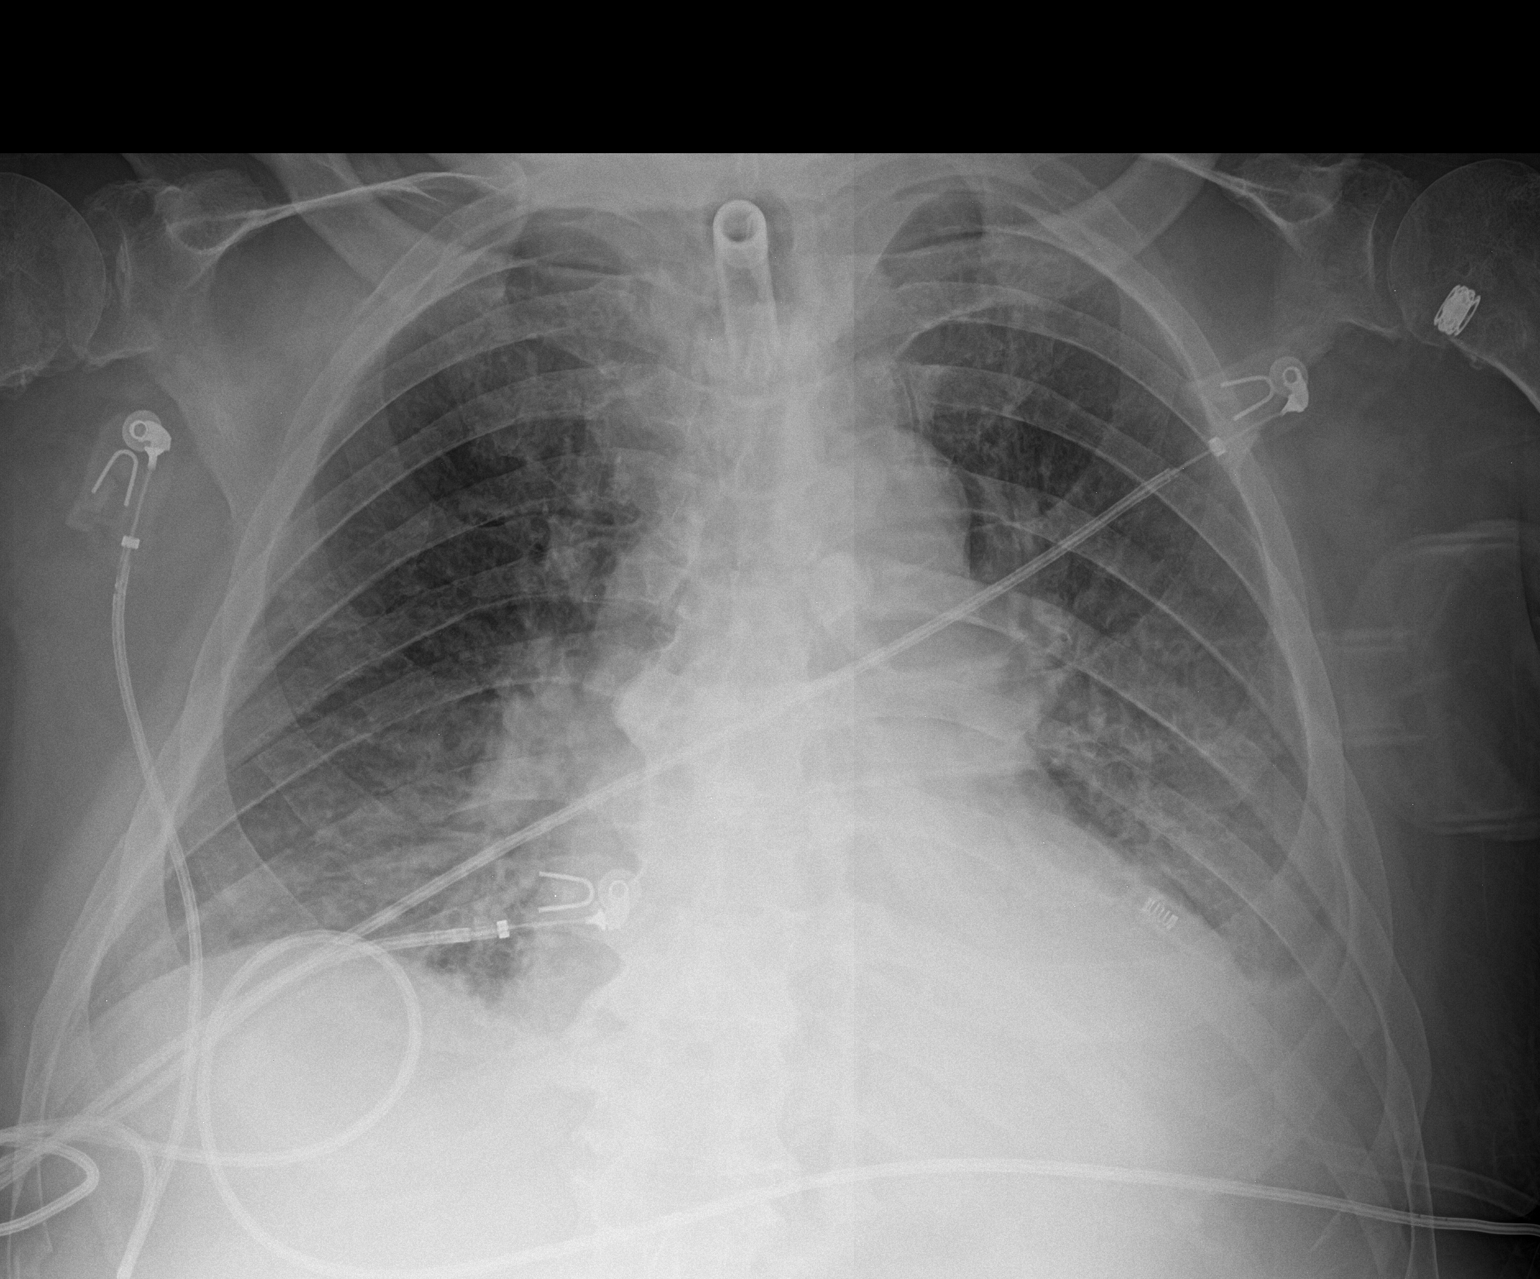

[1 of 1 positions shown; findings below may reference images not displayed]

FINDINGS: Tracheostomy catheter tip is 5.8 cm above the carina. There is a
small left pleural effusion with airspace opacity in the left lower
lung region. Suspect small right pleural effusion. There is
cardiomegaly with pulmonary vascularity normal. No adenopathy. There
is degenerative change in the thoracic spine.
IMPRESSION: Tracheostomy as described without pneumothorax. Left lower lobe
airspace opacity, likely due to atelectasis with potential
superimposed pneumonia. Small left pleural effusion and suspected
small right pleural effusion. Stable cardiac prominence.

## 2020-07-28 NOTE — Progress Notes (Signed)
Pulmonary Critical Care Medicine [place]   PULMONARY CRITICAL CARE SERVICE  PROGRESS NOTE  Date of Service: 07/28/2020  Daniel [name]  [id number]  DOB: Jan 04, 1941   DOA: 07/08/2020  Referring Physician: Merton Border, MD  HPI: Daniel [name] is a 80 y.o. male seen for follow up of Acute on Chronic Respiratory Failure.  Patient is resting comfortably to be put back on the ventilator after her PCO2 was found to be 74 now is on assist control mode.  There is some concern that patient may have received some sedation which knocked out his respiratory drive  Medications: Reviewed on Rounds  Physical Exam:  Vitals: Temperature 97.6 pulse 120 respiratory 17 blood pressure is 140/79 saturations 94%  Ventilator Settings on assist control FiO2 35% tidal volume 580 PEEP 5   General: Comfortable at this time  Eyes: Grossly normal lids, irises & conjunctiva  ENT: grossly tongue is normal  Neck: no obvious mass  Cardiovascular: S1 S2 normal no gallop  Respiratory: Scattered rhonchi expansion is equal  Abdomen: soft  Skin: no rash seen on limited exam  Musculoskeletal: not rigid  Psychiatric:unable to assess  Neurologic: no seizure no involuntary movements         Lab Data:   Basic Metabolic Panel: Recent Labs  Lab 07/23/20 0329 07/26/20 1123 07/27/20 0413  NA 146* 154* 149*  K 3.7 4.6 5.0  CL 109 110 109  CO2 30 35* 32  GLUCOSE 112* 156* 164*  BUN 54* 65* 69*  CREATININE 1.43* 1.70* 1.79*  CALCIUM 9.5 9.5 9.2  MG 2.4  --   --     ABG: Recent Labs  Lab 07/26/20 1002 07/28/20 0455 07/28/20 0725  PHART 7.313* 7.290* 7.425  PCO2ART 69.4* 74.3* 51.6*  PO2ART 79.4* 62.1* 61.6*  HCO3 34.2* 34.6* 33.3*  O2SAT 94.9 90.0 93.1    Liver Function Tests: No results for input(s): AST, ALT, ALKPHOS, BILITOT, PROT, ALBUMIN in the last 168 hours. No results for input(s): LIPASE, AMYLASE in the last 168 hours. No results for input(s):  AMMONIA in the last 168 hours.  CBC: Recent Labs  Lab 07/23/20 0329  WBC 11.4*  HGB 9.8*  HCT 32.6*  MCV 89.8  PLT 209    Cardiac Enzymes: No results for input(s): CKTOTAL, CKMB, CKMBINDEX, TROPONINI in the last 168 hours.  BNP (last 3 results) No results for input(s): BNP in the last 8760 hours.  ProBNP (last 3 results) No results for input(s): PROBNP in the last 8760 hours.  Radiological Exams: DG Chest Port 1 View  Result Date: 07/28/2020 CLINICAL DATA:  Hypoxia EXAM: PORTABLE CHEST 1 VIEW COMPARISON:  July 23, 2020 FINDINGS: Tracheostomy catheter tip is 5.8 cm above the carina. There is a small left pleural effusion with airspace opacity in the left lower lung region. Suspect small right pleural effusion. There is cardiomegaly with pulmonary vascularity normal. No adenopathy. There is degenerative change in the thoracic spine. IMPRESSION: Tracheostomy as described without pneumothorax. Left lower lobe airspace opacity, likely due to atelectasis with potential superimposed pneumonia. Small left pleural effusion and suspected small right pleural effusion. Stable cardiac prominence. Electronically Signed   By: Lowella Grip III M.D.   On: 07/28/2020 11:52    Assessment/Plan Active Problems:   Acute on chronic respiratory failure with hypoxia (HCC)   Healthcare-associated pneumonia   Cardiac arrest (HCC)   Chronic atrial fibrillation ([place])   1. Acute on chronic respiratory failure hypoxia we will continue with on full  support on the ventilator right now.  Consider a follow-up ABG. 2. Healthcare associated pneumonia treated slow improvement 3. Cardiac arrest rhythm stable we will continue to monitor 4. Chronic atrial fibrillation rate is controlled at this time   I have personally seen and evaluated the patient, evaluated laboratory and imaging results, formulated the assessment and plan and placed orders. The Patient requires high complexity decision making with  multiple systems involvement.  Rounds were done with the Respiratory Therapy Director and Staff therapists and discussed with nursing staff also.  [name], MD [facility] Pulmonary Critical Care Medicine Sleep Medicine

## 2020-07-29 ENCOUNTER — Other Ambulatory Visit (HOSPITAL_COMMUNITY): Payer: Self-pay

## 2020-07-29 DIAGNOSIS — I5031 Acute diastolic (congestive) heart failure: Secondary | ICD-10-CM | POA: Diagnosis not present

## 2020-07-29 DIAGNOSIS — I4891 Unspecified atrial fibrillation: Secondary | ICD-10-CM | POA: Diagnosis not present

## 2020-07-29 DIAGNOSIS — N1330 Unspecified hydronephrosis: Secondary | ICD-10-CM | POA: Diagnosis not present

## 2020-07-29 DIAGNOSIS — J9690 Respiratory failure, unspecified, unspecified whether with hypoxia or hypercapnia: Secondary | ICD-10-CM | POA: Diagnosis not present

## 2020-07-29 DIAGNOSIS — I4821 Permanent atrial fibrillation: Secondary | ICD-10-CM | POA: Diagnosis not present

## 2020-07-29 DIAGNOSIS — N179 Acute kidney failure, unspecified: Secondary | ICD-10-CM | POA: Diagnosis not present

## 2020-07-29 DIAGNOSIS — E870 Hyperosmolality and hypernatremia: Secondary | ICD-10-CM | POA: Diagnosis not present

## 2020-07-29 DIAGNOSIS — I2510 Atherosclerotic heart disease of native coronary artery without angina pectoris: Secondary | ICD-10-CM | POA: Diagnosis not present

## 2020-07-29 DIAGNOSIS — J9620 Acute and chronic respiratory failure, unspecified whether with hypoxia or hypercapnia: Secondary | ICD-10-CM | POA: Diagnosis not present

## 2020-07-29 LAB — BLOOD GAS, ARTERIAL
Acid-Base Excess: 7.6 mmol/L — ABNORMAL HIGH (ref 0.0–2.0)
Bicarbonate: 33.5 mmol/L — ABNORMAL HIGH (ref 20.0–28.0)
FIO2: 40
O2 Saturation: 93.9 %
Patient temperature: 36.8
pCO2 arterial: 65.1 mmHg (ref 32.0–48.0)
pH, Arterial: 7.33 — ABNORMAL LOW (ref 7.350–7.450)
pO2, Arterial: 69.5 mmHg — ABNORMAL LOW (ref 83.0–108.0)

## 2020-07-29 LAB — URINALYSIS, ROUTINE W REFLEX MICROSCOPIC
Bilirubin Urine: NEGATIVE
Glucose, UA: NEGATIVE mg/dL
Ketones, ur: NEGATIVE mg/dL
Leukocytes,Ua: NEGATIVE
Nitrite: NEGATIVE
Protein, ur: 100 mg/dL — AB
RBC / HPF: >50 RBC/hpf — ABNORMAL HIGH (ref 0–5)
Specific Gravity, Urine: 1.009 (ref 1.005–1.030)
pH: 7 (ref 5.0–8.0)

## 2020-07-29 LAB — CBC
HCT: 31.9 % — ABNORMAL LOW (ref 39.0–52.0)
Hemoglobin: 9.3 g/dL — ABNORMAL LOW (ref 13.0–17.0)
MCH: 27 pg (ref 26.0–34.0)
MCHC: 29.2 g/dL — ABNORMAL LOW (ref 30.0–36.0)
MCV: 92.7 fL (ref 80.0–100.0)
Platelets: 221 10*3/uL (ref 150–400)
RBC: 3.44 MIL/uL — ABNORMAL LOW (ref 4.22–5.81)
RDW: 17.3 % — ABNORMAL HIGH (ref 11.5–15.5)
WBC: 12 10*3/uL — ABNORMAL HIGH (ref 4.0–10.5)
nRBC: 0 % (ref 0.0–0.2)

## 2020-07-29 LAB — BASIC METABOLIC PANEL
Anion gap: 10 (ref 5–15)
BUN: 92 mg/dL — ABNORMAL HIGH (ref 8–23)
CO2: 33 mmol/L — ABNORMAL HIGH (ref 22–32)
Calcium: 9.5 mg/dL (ref 8.9–10.3)
Chloride: 99 mmol/L (ref 98–111)
Creatinine, Ser: 2.06 mg/dL — ABNORMAL HIGH (ref 0.61–1.24)
GFR, Estimated: 32 mL/min — ABNORMAL LOW (ref >60)
Glucose, Bld: 159 mg/dL — ABNORMAL HIGH (ref 70–99)
Potassium: 4 mmol/L (ref 3.5–5.1)
Sodium: 142 mmol/L (ref 135–145)

## 2020-07-29 LAB — MAGNESIUM: Magnesium: 2.5 mg/dL — ABNORMAL HIGH (ref 1.7–2.4)

## 2020-07-29 IMAGING — US US RENAL
1 series · 14 of 25 positions shown · non-contrast
Comparison: None

CLINICAL DATA: Acute kidney injury

EXAM:
RENAL / URINARY TRACT ULTRASOUND COMPLETE

[Series 1: us renal · 14 of 40 slices shown]
[im 1/40]
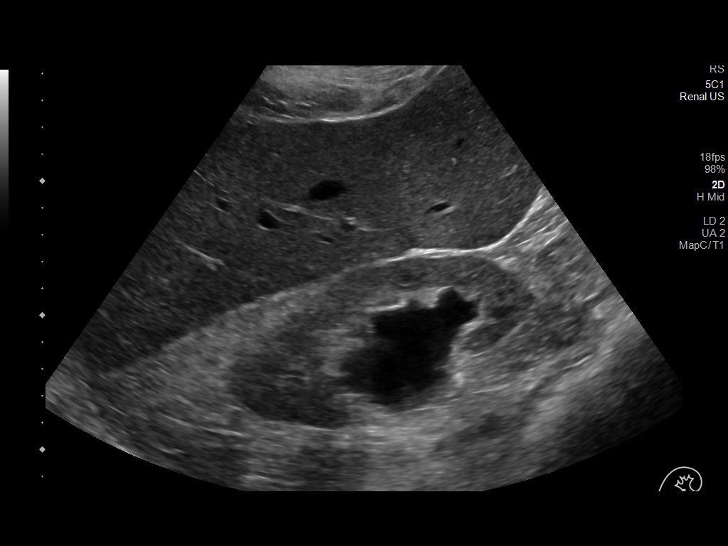
[im 4/40]
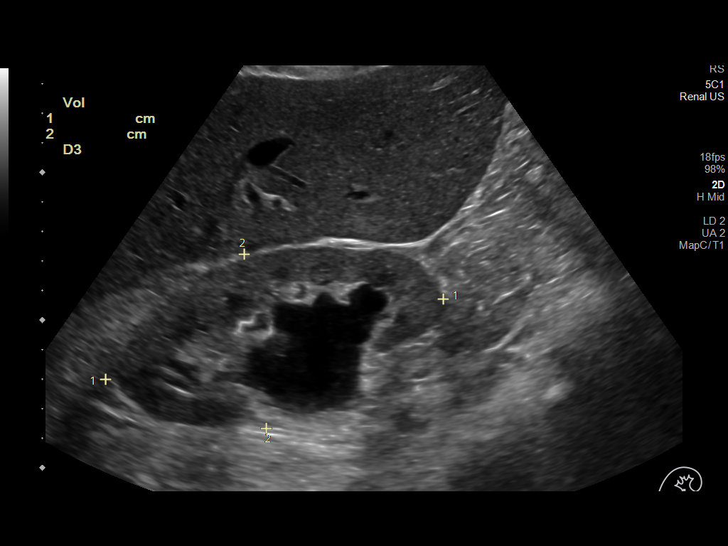
[im 7/40]
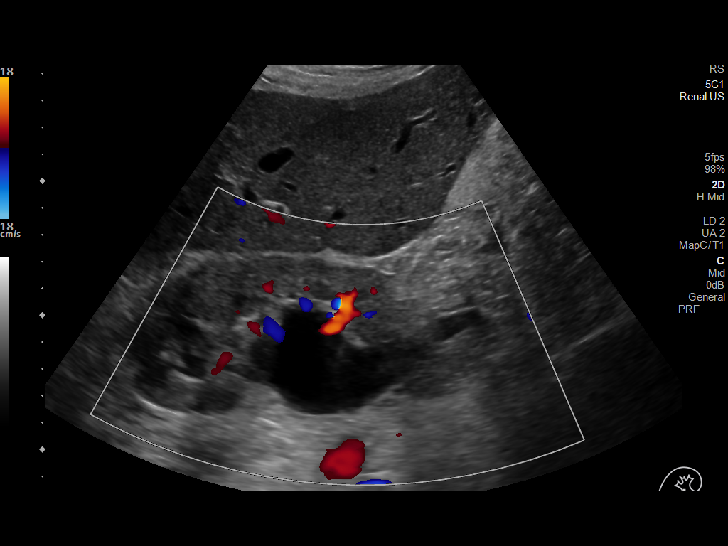
[im 10/40]
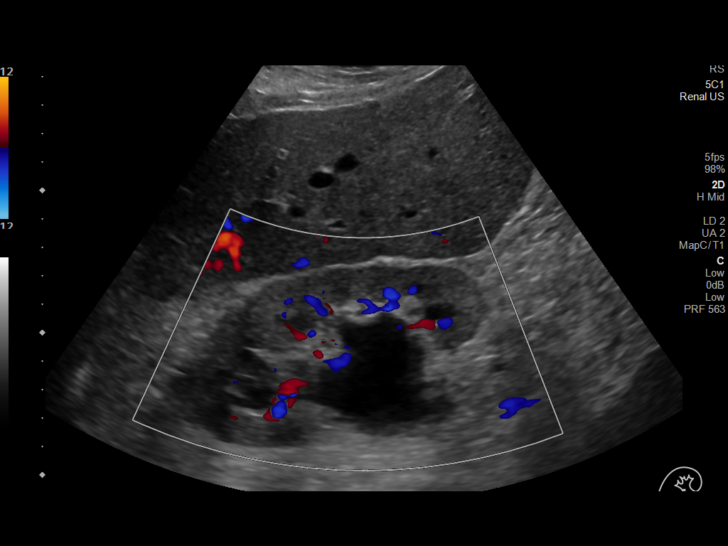
[im 14/40]
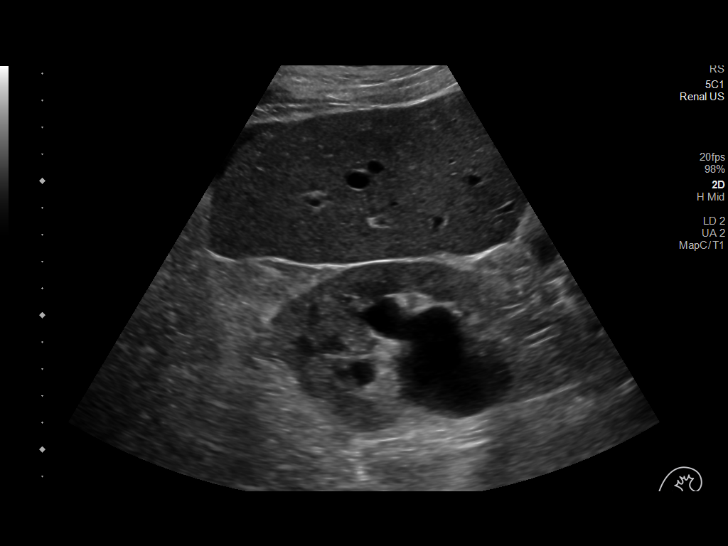
[im 15/40]
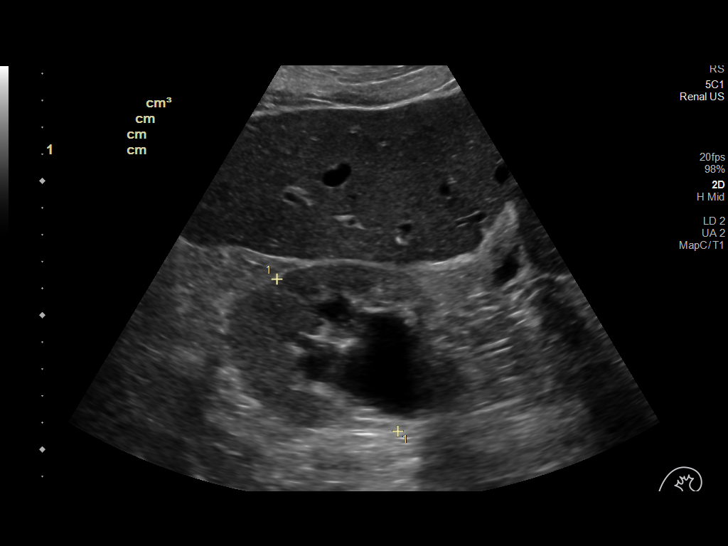
[im 18/40]
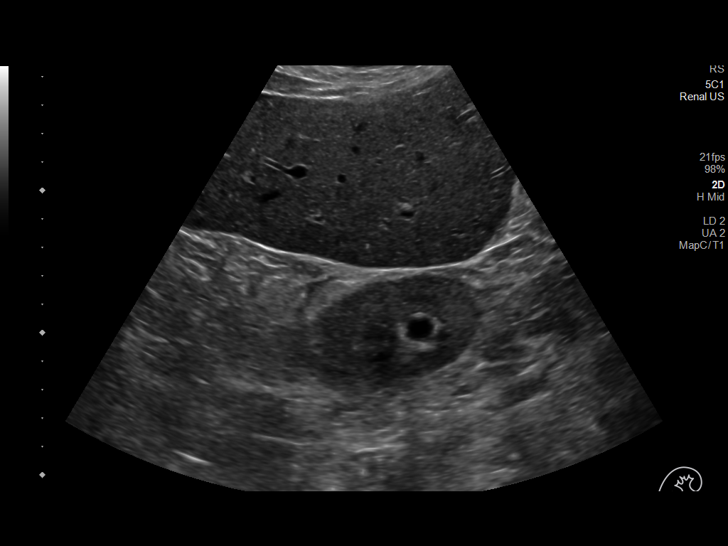
[im 22/40]
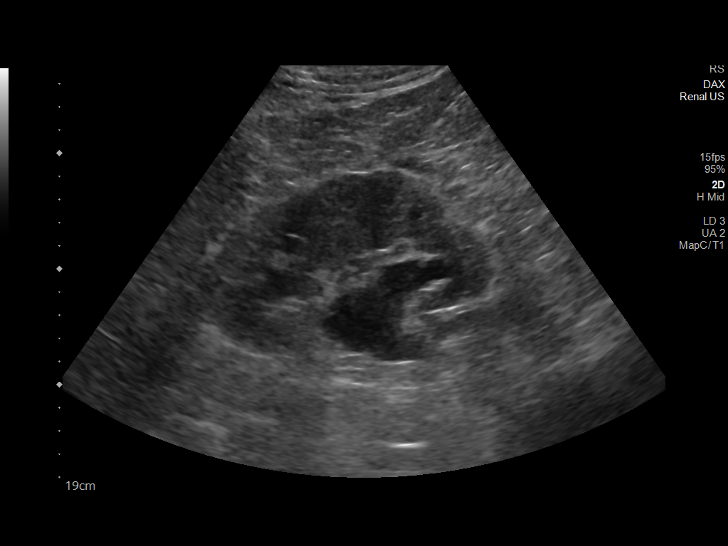
[im 25/40]
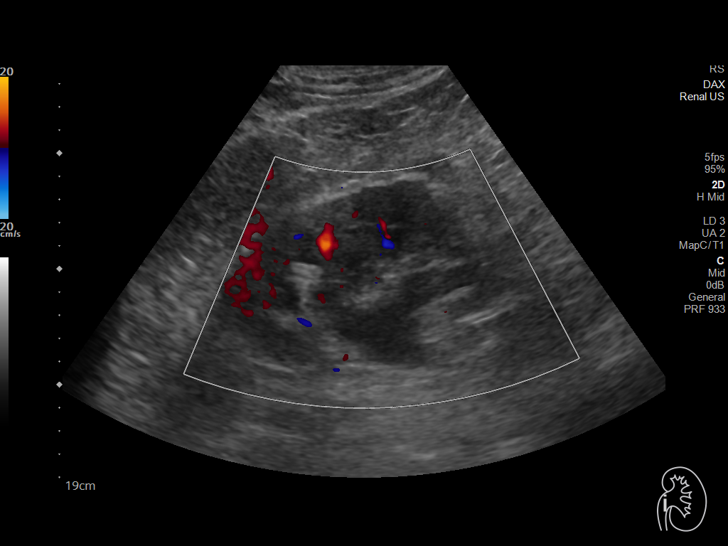
[im 27/40]
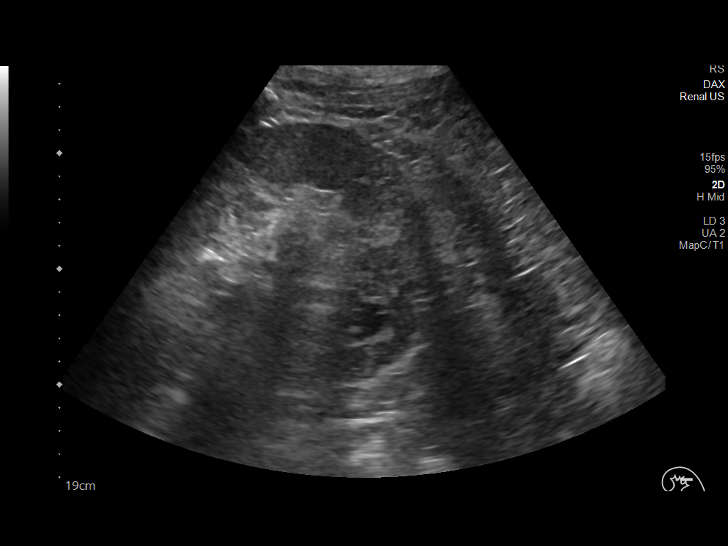
[im 30/40]
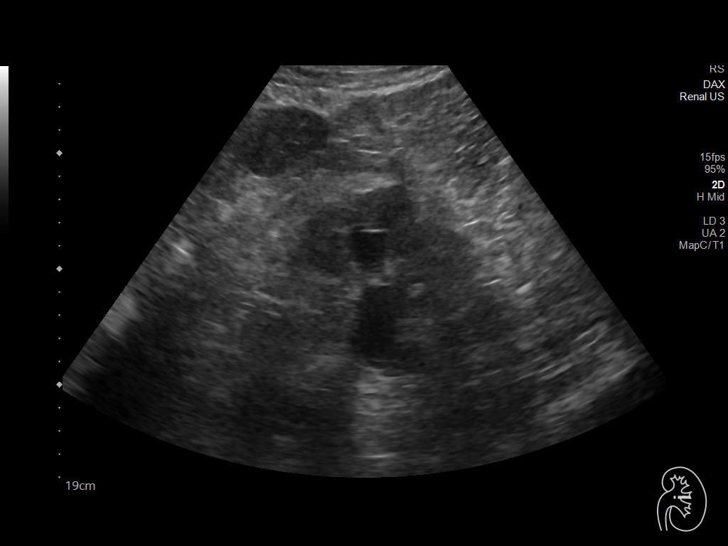
[im 33/40]
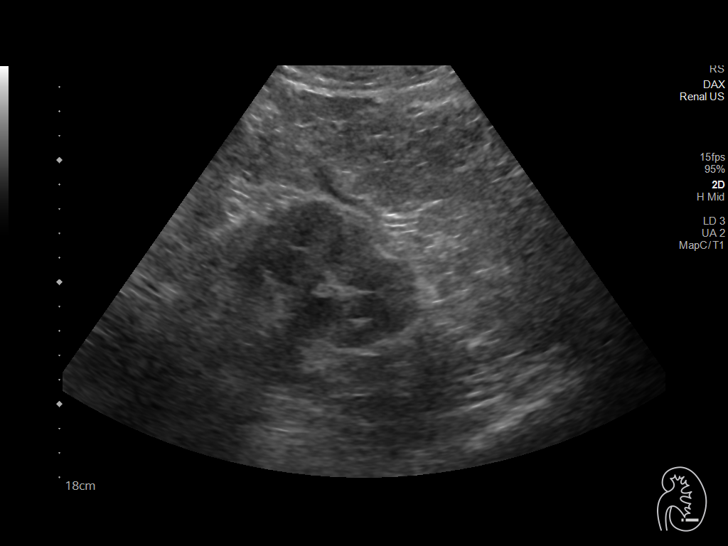
[im 36/40]
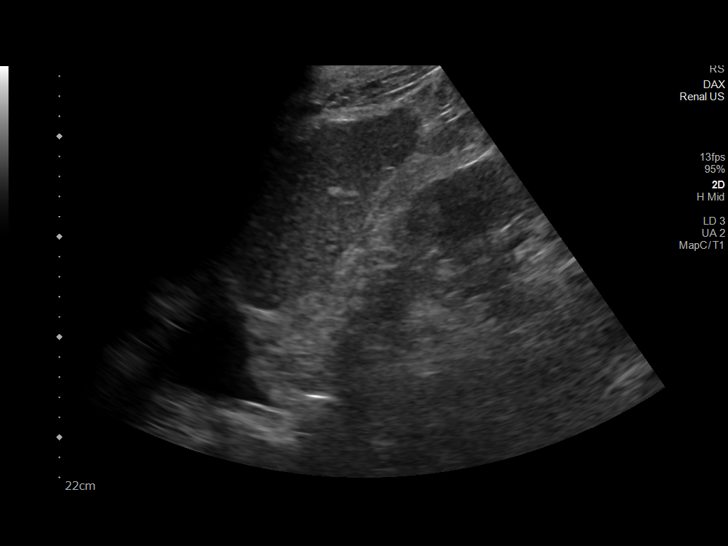
[im 40/40]
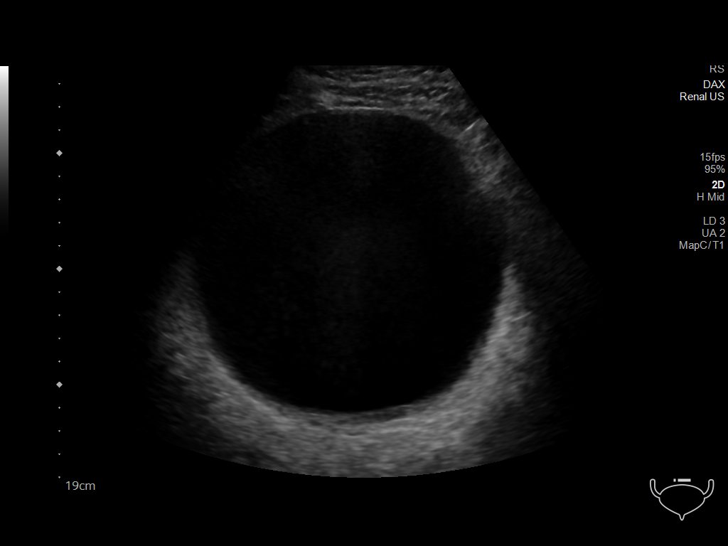

[14 of 25 positions shown; findings below may reference images not displayed]

FINDINGS: Right Kidney:

Renal measurements: 11.8 x 5.9 x 7.2 cm = volume: 265 mL. Normal
cortical thickness and echogenicity. Moderate hydronephrosis. No
renal mass or shadowing calcification.

Left Kidney:

Renal measurements: 13.1 x 8.0 x 7.3 cm = volume: 398 mL. Normal
cortical thickness. Upper normal cortical echogenicity. Moderate
hydronephrosis. No mass or shadowing calcification.

Bladder:

Distended urinary bladder, volume calculated at [AG] mL. No postvoid
imaging was performed.

Other:

N/A
IMPRESSION: Moderate hydronephrosis bilaterally, though potentially this may be
related to marked bladder distension, calculated bladder volume [AG]
mL.

Consider repeat imaging after decompression of the bladder.

## 2020-07-29 NOTE — Progress Notes (Signed)
Pulmonary Critical Care Medicine [place]   PULMONARY CRITICAL CARE SERVICE  PROGRESS NOTE  Date of Service: 07/29/2020  Daniel [name]  [id number]  DOB: Mar 04, 1941   DOA: 07/08/2020  Referring Physician: Merton Border, MD  HPI: Daniel [name] is a 80 y.o. male seen for follow up of Acute on Chronic Respiratory Failure.  Currently is off the ventilator on 40% FiO2 appears to be comfortable and requiring of 40%  Medications: Reviewed on Rounds  Physical Exam:  Vitals: Temperature is 97.8 pulse 130 respiratory rate is 20 blood pressure is 150/93 saturations 98%  Ventilator Settings on T-piece FiO2 40%  . General: Comfortable at this time . Eyes: Grossly normal lids, irises & conjunctiva . ENT: grossly tongue is normal . Neck: no obvious mass . Cardiovascular: S1 S2 normal no gallop . Respiratory: No rhonchi very coarse breath sounds . Abdomen: soft . Skin: no rash seen on limited exam . Musculoskeletal: not rigid . Psychiatric:unable to assess . Neurologic: no seizure no involuntary movements         Lab Data:   Basic Metabolic Panel: Recent Labs  Lab 07/23/20 0329 07/26/20 1123 07/27/20 0413 07/29/20 0416  NA 146* 154* 149* 142  K 3.7 4.6 5.0 4.0  CL 109 110 109 99  CO2 30 35* 32 33*  GLUCOSE 112* 156* 164* 159*  BUN 54* 65* 69* 92*  CREATININE 1.43* 1.70* 1.79* 2.06*  CALCIUM 9.5 9.5 9.2 9.5  MG 2.4  --   --  2.5*    ABG: Recent Labs  Lab 07/26/20 1002 07/28/20 0455 07/28/20 0725  PHART 7.313* 7.290* 7.425  PCO2ART 69.4* 74.3* 51.6*  PO2ART 79.4* 62.1* 61.6*  HCO3 34.2* 34.6* 33.3*  O2SAT 94.9 90.0 93.1    Liver Function Tests: No results for input(s): AST, ALT, ALKPHOS, BILITOT, PROT, ALBUMIN in the last 168 hours. No results for input(s): LIPASE, AMYLASE in the last 168 hours. No results for input(s): AMMONIA in the last 168 hours.  CBC: Recent Labs  Lab 07/23/20 0329 07/29/20 0416  WBC 11.4* 12.0*  HGB 9.8*  9.3*  HCT 32.6* 31.9*  MCV 89.8 92.7  PLT 209 221    Cardiac Enzymes: No results for input(s): CKTOTAL, CKMB, CKMBINDEX, TROPONINI in the last 168 hours.  BNP (last 3 results) No results for input(s): BNP in the last 8760 hours.  ProBNP (last 3 results) No results for input(s): PROBNP in the last 8760 hours.  Radiological Exams: DG Chest Port 1 View  Result Date: 07/28/2020 CLINICAL DATA:  Hypoxia EXAM: PORTABLE CHEST 1 VIEW COMPARISON:  July 23, 2020 FINDINGS: Tracheostomy catheter tip is 5.8 cm above the carina. There is a small left pleural effusion with airspace opacity in the left lower lung region. Suspect small right pleural effusion. There is cardiomegaly with pulmonary vascularity normal. No adenopathy. There is degenerative change in the thoracic spine. IMPRESSION: Tracheostomy as described without pneumothorax. Left lower lobe airspace opacity, likely due to atelectasis with potential superimposed pneumonia. Small left pleural effusion and suspected small right pleural effusion. Stable cardiac prominence. Electronically Signed   By: Lowella Grip III M.D.   On: 07/28/2020 11:52    Assessment/Plan Active Problems:   Acute on chronic respiratory failure with hypoxia (HCC)   Healthcare-associated pneumonia   Cardiac arrest (HCC)   Chronic atrial fibrillation ([place])   1. Acute on chronic respiratory failure hypoxia we will continue with T-piece titrate oxygen continue pulmonary toilet 2. Healthcare associated pneumonia treated we will  continue to follow 3. Cardiac arrest rhythm is stable 4. Chronic atrial fibrillation rate is controlled at this time   I have personally seen and evaluated the patient, evaluated laboratory and imaging results, formulated the assessment and plan and placed orders. The Patient requires high complexity decision making with multiple systems involvement.  Rounds were done with the Respiratory Therapy Director and Staff therapists and  discussed with nursing staff also.  [name], MD [facility] Pulmonary Critical Care Medicine Sleep Medicine

## 2020-07-29 NOTE — Consult Note (Signed)
Ref: Maudie Mercury, MD/Dr. Saul Fordyce   Subjective:  Resting comfortably. HR in 120's. Monitor shows atrial fibrillation.  Objective:  Vital Signs in the last 24 hours:  P:110-130's, BP: 150/93, R: 18, O2 sat: 98 % on 40 % FiO2 and off ventilator.  Physical Exam: BP Readings from Last 1 Encounters:  07/07/20 (!) 139/91     Wt Readings from Last 1 Encounters:  04/15/20 109 kg    Weight change:  There is no height or weight on file to calculate BMI. HEENT: [place]/AT, Eyes-Brown, Conjunctiva-Pale, Sclera-Non-icteric Neck: No JVD, No bruit, Trachea midline. Lungs:  Clearing, Bilateral. Cardiac:  Irregular rhythm, normal S1 and S2, no S3. II/VI systolic murmur. Abdomen:  Soft, non-tender. BS present. Extremities:  Trace edema present. No cyanosis. No clubbing. CNS: AxOx1, Cranial nerves grossly intact.  Skin: Warm and dry.   Intake/Output from previous day: No intake/output data recorded.    Lab Results: BMET    Component Value Date/Time   NA 142 07/29/2020 0416   NA 149 (H) 07/27/2020 0413   NA 154 (H) 07/26/2020 1123   NA 144 04/15/2020 1624   NA 144 02/12/2020 1154   NA 142 01/07/2020 1110   K 4.0 07/29/2020 0416   K 5.0 07/27/2020 0413   K 4.6 07/26/2020 1123   CL 99 07/29/2020 0416   CL 109 07/27/2020 0413   CL 110 07/26/2020 1123   CO2 33 (H) 07/29/2020 0416   CO2 32 07/27/2020 0413   CO2 35 (H) 07/26/2020 1123   GLUCOSE 159 (H) 07/29/2020 0416   GLUCOSE 164 (H) 07/27/2020 0413   GLUCOSE 156 (H) 07/26/2020 1123   BUN 92 (H) 07/29/2020 0416   BUN 69 (H) 07/27/2020 0413   BUN 65 (H) 07/26/2020 1123   BUN 16 04/15/2020 1624   BUN 18 02/12/2020 1154   BUN 17 01/07/2020 1110   CREATININE 2.06 (H) 07/29/2020 0416   CREATININE 1.79 (H) 07/27/2020 0413   CREATININE 1.70 (H) 07/26/2020 1123   CREATININE 1.47 (H) 02/25/2016 1229   CREATININE 1.52 (H) 12/04/2014 1630   CREATININE 1.46 (H) 04/29/2014 1513   CALCIUM 9.5 07/29/2020 0416   CALCIUM 9.2 07/27/2020 0413    CALCIUM 9.5 07/26/2020 1123   GFRNONAA 32 (L) 07/29/2020 0416   GFRNONAA 38 (L) 07/27/2020 0413   GFRNONAA 41 (L) 07/26/2020 1123   GFRNONAA 47 (L) 04/29/2014 1513   GFRNONAA 49 (L) 01/28/2014 1507   GFRAA 61 04/15/2020 1624   GFRAA 64 02/12/2020 1154   GFRAA >60 01/22/2020 1135   GFRAA 55 (L) 04/29/2014 1513   GFRAA 56 (L) 01/28/2014 1507   CBC    Component Value Date/Time   WBC 12.0 (H) 07/29/2020 0416   RBC 3.44 (L) 07/29/2020 0416   HGB 9.3 (L) 07/29/2020 0416   HGB 13.3 04/15/2020 1624   HCT 31.9 (L) 07/29/2020 0416   HCT 40.9 04/15/2020 1624   PLT 221 07/29/2020 0416   PLT 191 04/15/2020 1624   MCV 92.7 07/29/2020 0416   MCV 83 04/15/2020 1624   MCH 27.0 07/29/2020 0416   MCHC 29.2 (L) 07/29/2020 0416   RDW 17.3 (H) 07/29/2020 0416   RDW 13.8 04/15/2020 1624   LYMPHSABS 2.4 06/14/2020 1507   LYMPHSABS 1.9 04/15/2020 1624   MONOABS 0.8 06/14/2020 1507   EOSABS 0.3 06/14/2020 1507   EOSABS 0.2 04/15/2020 1624   BASOSABS 0.0 06/14/2020 1507   BASOSABS 0.0 04/15/2020 1624   HEPATIC Function Panel Recent Labs  04/15/20 1624 05/24/20 2028 06/14/20 1507  PROT 8.0 8.1 6.9   HEMOGLOBIN A1C No components found for: HGA1C,  MPG CARDIAC ENZYMES No results found for: CKTOTAL, CKMB, CKMBINDEX, TROPONINI BNP No results for input(s): PROBNP in the last 8760 hours. TSH Recent Labs    12/05/19 1149 01/02/20 1644  TSH 4.900* 4.240   CHOLESTEROL Recent Labs    12/05/19 1149  CHOL 108    Scheduled Meds: Continuous Infusions: PRN Meds:.  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 Bid and add Diltiazem 30 mg. Bid.    LOS: 0 days   Time spent including chart review, lab review, examination, discussion with patient/Nurse/Doctor : 30  min   Dixie Dials  MD  07/29/2020, 10:17 AM

## 2020-07-30 ENCOUNTER — Other Ambulatory Visit (HOSPITAL_COMMUNITY): Payer: Self-pay

## 2020-07-30 DIAGNOSIS — J9690 Respiratory failure, unspecified, unspecified whether with hypoxia or hypercapnia: Secondary | ICD-10-CM | POA: Diagnosis not present

## 2020-07-30 DIAGNOSIS — N179 Acute kidney failure, unspecified: Secondary | ICD-10-CM | POA: Diagnosis not present

## 2020-07-30 DIAGNOSIS — Z4682 Encounter for fitting and adjustment of non-vascular catheter: Secondary | ICD-10-CM | POA: Diagnosis not present

## 2020-07-30 DIAGNOSIS — I4891 Unspecified atrial fibrillation: Secondary | ICD-10-CM | POA: Diagnosis not present

## 2020-07-30 DIAGNOSIS — N182 Chronic kidney disease, stage 2 (mild): Secondary | ICD-10-CM | POA: Diagnosis not present

## 2020-07-30 DIAGNOSIS — S02651A Fracture of angle of right mandible, initial encounter for closed fracture: Secondary | ICD-10-CM | POA: Diagnosis not present

## 2020-07-30 DIAGNOSIS — N1330 Unspecified hydronephrosis: Secondary | ICD-10-CM | POA: Diagnosis not present

## 2020-07-30 IMAGING — DX DG ABD PORTABLE 1V
1 series · 1 of 1 positions shown · non-contrast
Comparison: Abdominal radiograph [DATE], chest radiograph
[DATE]

CLINICAL DATA: NG tube placement

EXAM:
PORTABLE ABDOMEN - 1 VIEW

[abdomen supine]
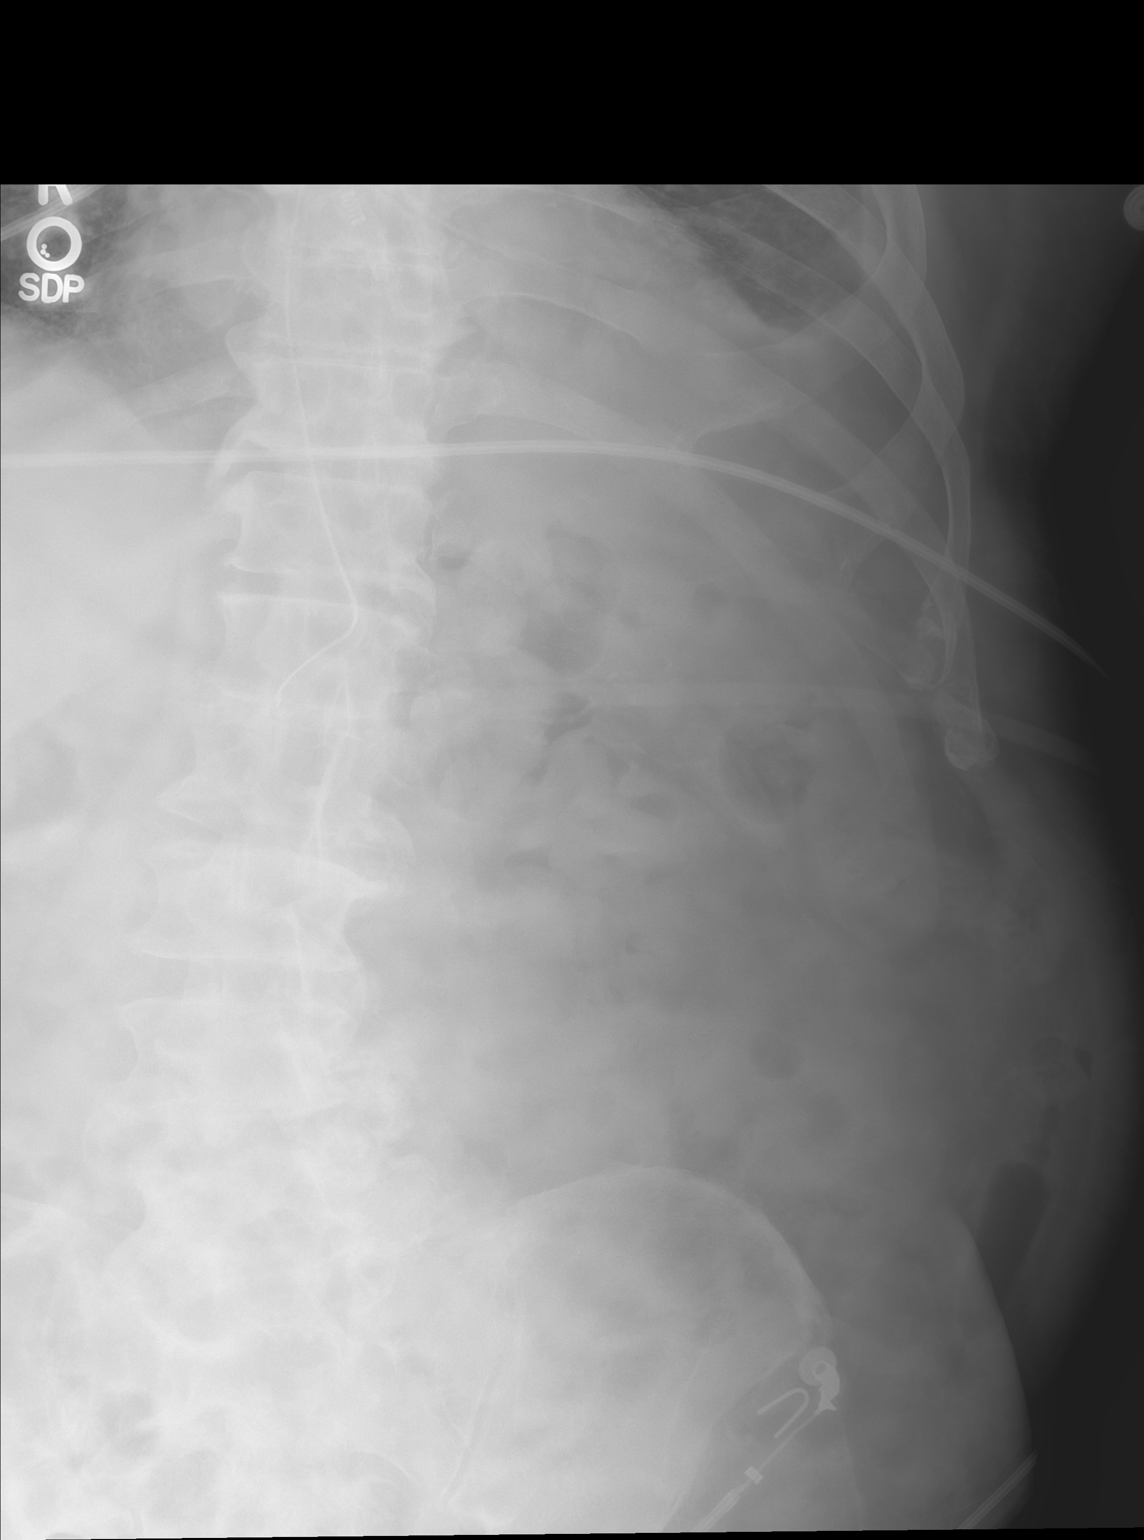

[1 of 1 positions shown; findings below may reference images not displayed]

FINDINGS: Transesophageal tube tip is positioned near the level of the GE
junction. Consider advancing 3-5 cm for optimal function. Persistent
left basilar density likely reflecting a combination of
pleuroparenchymal disease as suspected on comparison chest
radiograph. No high-grade obstructive bowel gas pattern or
suspicious abdominal calcifications within the included margins of
imaging. No acute or worrisome osseous abnormalities. Degenerative
changes in the spine and pelvis as included.
IMPRESSION: 1. Transesophageal tube tip is positioned near the level of the GE
junction. Consider advancing 3-5 cm for optimal function.
2. Persistent left basilar pleuroparenchymal disease.

## 2020-07-30 NOTE — Progress Notes (Signed)
Pulmonary Critical Care Medicine [place]   PULMONARY CRITICAL CARE SERVICE  PROGRESS NOTE  Date of Service: 07/30/2020  Daniel [name]  IPJ:[id number]  DOB: 07-Aug-1940   DOA: 07/08/2020  Referring Physician: Merton Border, MD  HPI: Daniel [name] is a 80 y.o. male seen for follow up of Acute on Chronic Respiratory Failure.  Apparently patient got into some respiratory distress once again was placed back on the ventilator.  Right now is back off the ventilator on T collar  Medications: Reviewed on Rounds  Physical Exam:  Vitals: Temperature is 97.5 pulse 93 respiratory rate 20 blood pressure is 90/52 saturations 98%  Ventilator Settings off the vent on T collar 40% FiO2  . General: Comfortable at this time . Eyes: Grossly normal lids, irises & conjunctiva . ENT: grossly tongue is normal . Neck: no obvious mass . Cardiovascular: S1 S2 normal no gallop . Respiratory: Scattered rhonchi expansion is equal at this time . Abdomen: soft . Skin: no rash seen on limited exam . Musculoskeletal: not rigid . Psychiatric:unable to assess . Neurologic: no seizure no involuntary movements         Lab Data:   Basic Metabolic Panel: Recent Labs  Lab 07/26/20 1123 07/27/20 0413 07/29/20 0416  NA 154* 149* 142  K 4.6 5.0 4.0  CL 110 109 99  CO2 35* 32 33*  GLUCOSE 156* 164* 159*  BUN 65* 69* 92*  CREATININE 1.70* 1.79* 2.06*  CALCIUM 9.5 9.2 9.5  MG  --   --  2.5*    ABG: Recent Labs  Lab 07/26/20 1002 07/28/20 0455 07/28/20 0725 07/29/20 1230  PHART 7.313* 7.290* 7.425 7.330*  PCO2ART 69.4* 74.3* 51.6* 65.1*  PO2ART 79.4* 62.1* 61.6* 69.5*  HCO3 34.2* 34.6* 33.3* 33.5*  O2SAT 94.9 90.0 93.1 93.9    Liver Function Tests: No results for input(s): AST, ALT, ALKPHOS, BILITOT, PROT, ALBUMIN in the last 168 hours. No results for input(s): LIPASE, AMYLASE in the last 168 hours. No results for input(s): AMMONIA in the last 168 hours.  CBC: Recent  Labs  Lab 07/29/20 0416  WBC 12.0*  HGB 9.3*  HCT 31.9*  MCV 92.7  PLT 221    Cardiac Enzymes: No results for input(s): CKTOTAL, CKMB, CKMBINDEX, TROPONINI in the last 168 hours.  BNP (last 3 results) No results for input(s): BNP in the last 8760 hours.  ProBNP (last 3 results) No results for input(s): PROBNP in the last 8760 hours.  Radiological Exams: US RENAL  Result Date: 07/29/2020 CLINICAL DATA:  Acute kidney injury EXAM: RENAL / URINARY TRACT ULTRASOUND COMPLETE COMPARISON:  None FINDINGS: Right Kidney: Renal measurements: 11.8 x 5.9 x 7.2 cm = volume: 265 mL. Normal cortical thickness and echogenicity. Moderate hydronephrosis. No renal mass or shadowing calcification. Left Kidney: Renal measurements: 13.1 x 8.0 x 7.3 cm = volume: 398 mL. Normal cortical thickness. Upper normal cortical echogenicity. Moderate hydronephrosis. No mass or shadowing calcification. Bladder: Distended urinary bladder, volume calculated at 1948 mL. No postvoid imaging was performed. Other: N/A IMPRESSION: Moderate hydronephrosis bilaterally, though potentially this may be related to marked bladder distension, calculated bladder volume 1948 mL. Consider repeat imaging after decompression of the bladder. Electronically Signed   By: Lavonia Dana M.D.   On: 07/29/2020 16:14   DG Chest Port 1 View  Result Date: 07/28/2020 CLINICAL DATA:  Hypoxia EXAM: PORTABLE CHEST 1 VIEW COMPARISON:  July 23, 2020 FINDINGS: Tracheostomy catheter tip is 5.8 cm above the carina. There  is a small left pleural effusion with airspace opacity in the left lower lung region. Suspect small right pleural effusion. There is cardiomegaly with pulmonary vascularity normal. No adenopathy. There is degenerative change in the thoracic spine. IMPRESSION: Tracheostomy as described without pneumothorax. Left lower lobe airspace opacity, likely due to atelectasis with potential superimposed pneumonia. Small left pleural effusion and suspected  small right pleural effusion. Stable cardiac prominence. Electronically Signed   By: Lowella Grip III M.D.   On: 07/28/2020 11:52    Assessment/Plan Active Problems:   Acute on chronic respiratory failure with hypoxia (HCC)   Healthcare-associated pneumonia   Cardiac arrest (HCC)   Chronic atrial fibrillation ([place])   1. Acute on chronic respiratory failure hypoxia plan is going to be to continue with T collar titrate oxygen continue pulmonary toilet. 2. Healthcare associated pneumonia treated we will continue to follow 3. Cardiac arrest rhythm has been stable 4. Chronic atrial fibrillation rate is controlled at this time   I have personally seen and evaluated the patient, evaluated laboratory and imaging results, formulated the assessment and plan and placed orders. The Patient requires high complexity decision making with multiple systems involvement.  Rounds were done with the Respiratory Therapy Director and Staff therapists and discussed with nursing staff also.  [name], MD [facility] Pulmonary Critical Care Medicine Sleep Medicine
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
## 2020-07-31 ENCOUNTER — Other Ambulatory Visit (HOSPITAL_COMMUNITY): Payer: Self-pay

## 2020-07-31 DIAGNOSIS — S02651A Fracture of angle of right mandible, initial encounter for closed fracture: Secondary | ICD-10-CM | POA: Diagnosis not present

## 2020-07-31 DIAGNOSIS — Z4682 Encounter for fitting and adjustment of non-vascular catheter: Secondary | ICD-10-CM | POA: Diagnosis not present

## 2020-07-31 DIAGNOSIS — I4891 Unspecified atrial fibrillation: Secondary | ICD-10-CM | POA: Diagnosis not present

## 2020-07-31 DIAGNOSIS — N179 Acute kidney failure, unspecified: Secondary | ICD-10-CM | POA: Diagnosis not present

## 2020-07-31 DIAGNOSIS — J9690 Respiratory failure, unspecified, unspecified whether with hypoxia or hypercapnia: Secondary | ICD-10-CM | POA: Diagnosis not present

## 2020-07-31 LAB — CBC
HCT: 28.8 % — ABNORMAL LOW (ref 39.0–52.0)
Hemoglobin: 9 g/dL — ABNORMAL LOW (ref 13.0–17.0)
MCH: 28 pg (ref 26.0–34.0)
MCHC: 31.3 g/dL (ref 30.0–36.0)
MCV: 89.4 fL (ref 80.0–100.0)
Platelets: 219 10*3/uL (ref 150–400)
RBC: 3.22 MIL/uL — ABNORMAL LOW (ref 4.22–5.81)
RDW: 17.1 % — ABNORMAL HIGH (ref 11.5–15.5)
WBC: 13 10*3/uL — ABNORMAL HIGH (ref 4.0–10.5)
nRBC: 0 % (ref 0.0–0.2)

## 2020-07-31 LAB — URINE CULTURE: Culture: <10000 — AB

## 2020-07-31 LAB — BASIC METABOLIC PANEL
Anion gap: 10 (ref 5–15)
BUN: 94 mg/dL — ABNORMAL HIGH (ref 8–23)
CO2: 34 mmol/L — ABNORMAL HIGH (ref 22–32)
Calcium: 9.4 mg/dL (ref 8.9–10.3)
Chloride: 106 mmol/L (ref 98–111)
Creatinine, Ser: 2.11 mg/dL — ABNORMAL HIGH (ref 0.61–1.24)
GFR, Estimated: 31 mL/min — ABNORMAL LOW (ref >60)
Glucose, Bld: 150 mg/dL — ABNORMAL HIGH (ref 70–99)
Potassium: 3.1 mmol/L — ABNORMAL LOW (ref 3.5–5.1)
Sodium: 150 mmol/L — ABNORMAL HIGH (ref 135–145)

## 2020-07-31 LAB — MAGNESIUM: Magnesium: 2.2 mg/dL (ref 1.7–2.4)

## 2020-07-31 IMAGING — DX DG ABDOMEN 1V
1 series · 1 of 1 positions shown · non-contrast
Comparison: Single-view of the abdomen earlier today.

CLINICAL DATA: NG tube placement.

EXAM:
ABDOMEN - 1 VIEW

[abdomen supine]
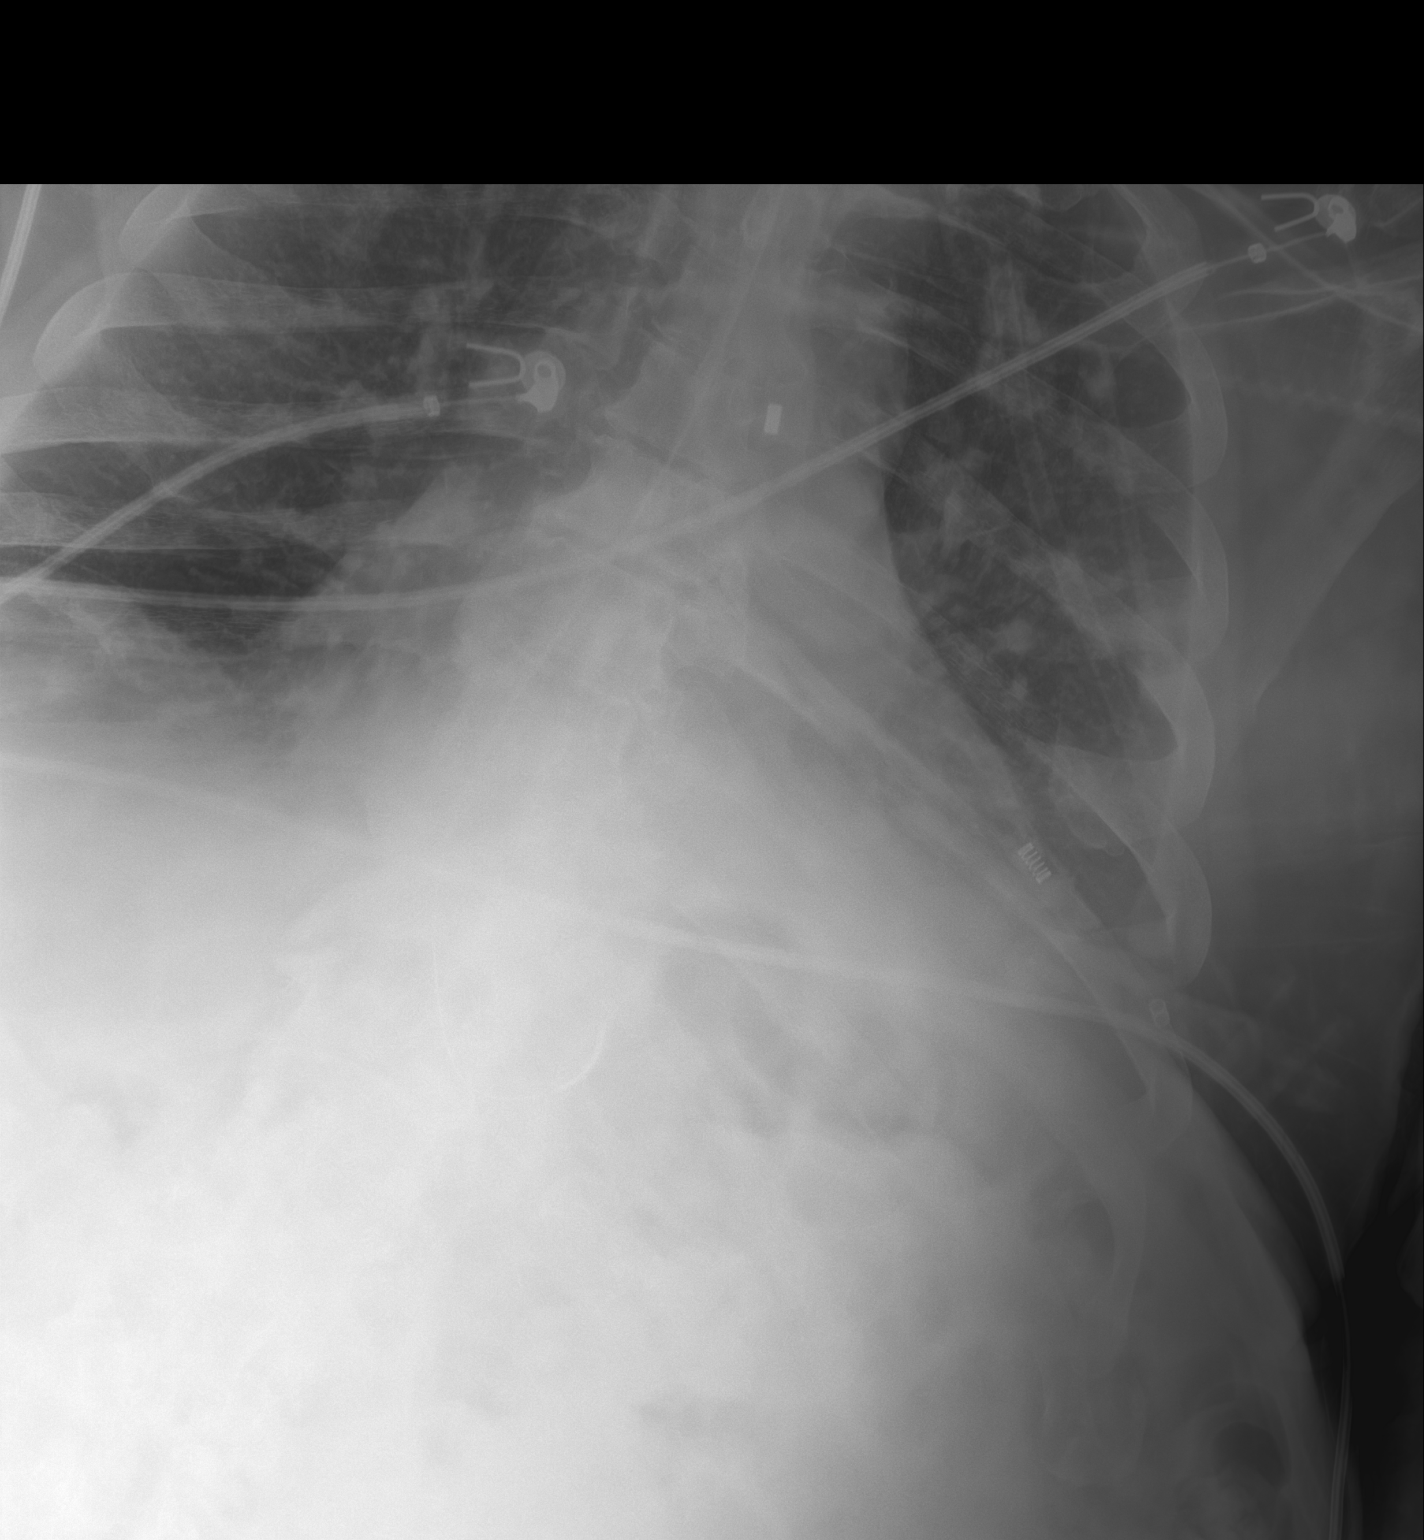

[1 of 1 positions shown; findings below may reference images not displayed]

FINDINGS: NG tube tip projects in the stomach.
IMPRESSION: As above.

## 2020-07-31 NOTE — Progress Notes (Signed)
Pulmonary Critical Care Medicine [place]   PULMONARY CRITICAL CARE SERVICE  PROGRESS NOTE  Date of Service: 07/31/2020  Daniel [name]  UJW:[id number]  DOB: 1940-11-19   DOA: 07/08/2020  Referring Physician: Merton Border, MD  HPI: Daniel [name] is a 80 y.o. male seen for follow up of Acute on Chronic Respiratory Failure.  Patient is afebrile right now off the ventilator on T collar has been on 40% FiO2  Medications: Reviewed on Rounds  Physical Exam:  Vitals: Temperature is 98.6 pulse 91 respiratory 20 blood pressure is 124/72 saturations 100%  Ventilator Settings off ventilator on T collar FiO2 40%  . General: Comfortable at this time . Eyes: Grossly normal lids, irises & conjunctiva . ENT: grossly tongue is normal . Neck: no obvious mass . Cardiovascular: S1 S2 normal no gallop . Respiratory: No rhonchi no rales are noted at this . Abdomen: soft . Skin: no rash seen on limited exam . Musculoskeletal: not rigid . Psychiatric:unable to assess . Neurologic: no seizure no involuntary movements         Lab Data:   Basic Metabolic Panel: Recent Labs  Lab 07/26/20 1123 07/27/20 0413 07/29/20 0416 07/31/20 0319  NA 154* 149* 142 150*  K 4.6 5.0 4.0 3.1*  CL 110 109 99 106  CO2 35* 32 33* 34*  GLUCOSE 156* 164* 159* 150*  BUN 65* 69* 92* 94*  CREATININE 1.70* 1.79* 2.06* 2.11*  CALCIUM 9.5 9.2 9.5 9.4  MG  --   --  2.5* 2.2    ABG: Recent Labs  Lab 07/26/20 1002 07/28/20 0455 07/28/20 0725 07/29/20 1230  PHART 7.313* 7.290* 7.425 7.330*  PCO2ART 69.4* 74.3* 51.6* 65.1*  PO2ART 79.4* 62.1* 61.6* 69.5*  HCO3 34.2* 34.6* 33.3* 33.5*  O2SAT 94.9 90.0 93.1 93.9    Liver Function Tests: No results for input(s): AST, ALT, ALKPHOS, BILITOT, PROT, ALBUMIN in the last 168 hours. No results for input(s): LIPASE, AMYLASE in the last 168 hours. No results for input(s): AMMONIA in the last 168 hours.  CBC: Recent Labs  Lab  07/29/20 0416 07/31/20 0319  WBC 12.0* 13.0*  HGB 9.3* 9.0*  HCT 31.9* 28.8*  MCV 92.7 89.4  PLT 221 219    Cardiac Enzymes: No results for input(s): CKTOTAL, CKMB, CKMBINDEX, TROPONINI in the last 168 hours.  BNP (last 3 results) No results for input(s): BNP in the last 8760 hours.  ProBNP (last 3 results) No results for input(s): PROBNP in the last 8760 hours.  Radiological Exams: US RENAL  Result Date: 07/29/2020 CLINICAL DATA:  Acute kidney injury EXAM: RENAL / URINARY TRACT ULTRASOUND COMPLETE COMPARISON:  None FINDINGS: Right Kidney: Renal measurements: 11.8 x 5.9 x 7.2 cm = volume: 265 mL. Normal cortical thickness and echogenicity. Moderate hydronephrosis. No renal mass or shadowing calcification. Left Kidney: Renal measurements: 13.1 x 8.0 x 7.3 cm = volume: 398 mL. Normal cortical thickness. Upper normal cortical echogenicity. Moderate hydronephrosis. No mass or shadowing calcification. Bladder: Distended urinary bladder, volume calculated at 1948 mL. No postvoid imaging was performed. Other: N/A IMPRESSION: Moderate hydronephrosis bilaterally, though potentially this may be related to marked bladder distension, calculated bladder volume 1948 mL. Consider repeat imaging after decompression of the bladder. Electronically Signed   By: Lavonia Dana M.D.   On: 07/29/2020 16:14   DG Abd Portable 1V  Result Date: 07/30/2020 CLINICAL DATA:  NG tube placement EXAM: PORTABLE ABDOMEN - 1 VIEW COMPARISON:  Abdominal radiograph 07/14/2020, chest radiograph 07/28/2020  FINDINGS: Transesophageal tube tip is positioned near the level of the GE junction. Consider advancing 3-5 cm for optimal function. Persistent left basilar density likely reflecting a combination of pleuroparenchymal disease as suspected on comparison chest radiograph. No high-grade obstructive bowel gas pattern or suspicious abdominal calcifications within the included margins of imaging. No acute or worrisome osseous  abnormalities. Degenerative changes in the spine and pelvis as included. IMPRESSION: 1. Transesophageal tube tip is positioned near the level of the GE junction. Consider advancing 3-5 cm for optimal function. 2. Persistent left basilar pleuroparenchymal disease. Electronically Signed   By: Lovena Le M.D.   On: 07/30/2020 23:47    Assessment/Plan Active Problems:   Acute on chronic respiratory failure with hypoxia (HCC)   Healthcare-associated pneumonia   Cardiac arrest (HCC)   Chronic atrial fibrillation ([place])   1. Acute on chronic respiratory failure hypoxia plan is to continue with T-piece titrate oxygen continue pulmonary toilet. 2. Healthcare associated pneumonia treated slowly improving 3. Cardiac arrest rhythm is stable 4. Chronic atrial fibrillation rate is controlled at this time   I have personally seen and evaluated the patient, evaluated laboratory and imaging results, formulated the assessment and plan and placed orders. The Patient requires high complexity decision making with multiple systems involvement.  Rounds were done with the Respiratory Therapy Director and Staff therapists and discussed with nursing staff also.  [name], MD [facility] Pulmonary Critical Care Medicine Sleep Medicine

## 2020-08-01 DIAGNOSIS — S02651A Fracture of angle of right mandible, initial encounter for closed fracture: Secondary | ICD-10-CM | POA: Diagnosis not present

## 2020-08-01 DIAGNOSIS — J9690 Respiratory failure, unspecified, unspecified whether with hypoxia or hypercapnia: Secondary | ICD-10-CM | POA: Diagnosis not present

## 2020-08-01 DIAGNOSIS — I4891 Unspecified atrial fibrillation: Secondary | ICD-10-CM | POA: Diagnosis not present

## 2020-08-01 DIAGNOSIS — N179 Acute kidney failure, unspecified: Secondary | ICD-10-CM | POA: Diagnosis not present

## 2020-08-01 LAB — BLOOD GAS, ARTERIAL
Acid-Base Excess: 13.2 mmol/L — ABNORMAL HIGH (ref 0.0–2.0)
Bicarbonate: 38.8 mmol/L — ABNORMAL HIGH (ref 20.0–28.0)
FIO2: 40
O2 Saturation: 97.8 %
Patient temperature: 37
pCO2 arterial: 65 mmHg — ABNORMAL HIGH (ref 32.0–48.0)
pH, Arterial: 7.393 (ref 7.350–7.450)
pO2, Arterial: 95.3 mmHg (ref 83.0–108.0)

## 2020-08-01 LAB — POTASSIUM: Potassium: 3.1 mmol/L — ABNORMAL LOW (ref 3.5–5.1)

## 2020-08-01 NOTE — Progress Notes (Signed)
Pulmonary Critical Care Medicine [place]   PULMONARY CRITICAL CARE SERVICE  PROGRESS NOTE  Date of Service: 08/01/2020  Daniel [name]  SHF:[id number]  DOB: [birth year]   DOA: 07/08/2020  Referring Physician: Merton Border, MD  HPI: Daniel [name] is a 80 y.o. male seen for follow up of Acute on Chronic Respiratory Failure.  Patient remains afebrile without distress.  Comfortable.  Medications: Reviewed on Rounds  Physical Exam:  Vitals: Temperature is 98.6 pulse 93 respiratory 14 blood pressure is 124/70 saturations 99%  Ventilator Settings on T collar FiO2 40%  . General: Comfortable at this time . Eyes: Grossly normal lids, irises & conjunctiva . ENT: grossly tongue is normal . Neck: no obvious mass . Cardiovascular: S1 S2 normal no gallop . Respiratory: Scattered rhonchi expansion is equal at this time . Abdomen: soft . Skin: no rash seen on limited exam . Musculoskeletal: not rigid . Psychiatric:unable to assess . Neurologic: no seizure no involuntary movements         Lab Data:   Basic Metabolic Panel: Recent Labs  Lab 07/26/20 1123 07/27/20 0413 07/29/20 0416 07/31/20 0319 08/01/20 0900  NA 154* 149* 142 150*  --   K 4.6 5.0 4.0 3.1* 3.1*  CL 110 109 99 106  --   CO2 35* 32 33* 34*  --   GLUCOSE 156* 164* 159* 150*  --   BUN 65* 69* 92* 94*  --   CREATININE 1.70* 1.79* 2.06* 2.11*  --   CALCIUM 9.5 9.2 9.5 9.4  --   MG  --   --  2.5* 2.2  --     ABG: Recent Labs  Lab 07/26/20 1002 07/28/20 0455 07/28/20 0725 07/29/20 1230 08/01/20 0443  PHART 7.313* 7.290* 7.425 7.330* 7.393  PCO2ART 69.4* 74.3* 51.6* 65.1* 65.0*  PO2ART 79.4* 62.1* 61.6* 69.5* 95.3  HCO3 34.2* 34.6* 33.3* 33.5* 38.8*  O2SAT 94.9 90.0 93.1 93.9 97.8    Liver Function Tests: No results for input(s): AST, ALT, ALKPHOS, BILITOT, PROT, ALBUMIN in the last 168 hours. No results for input(s): LIPASE, AMYLASE in the last 168 hours. No results for  input(s): AMMONIA in the last 168 hours.  CBC: Recent Labs  Lab 07/29/20 0416 07/31/20 0319  WBC 12.0* 13.0*  HGB 9.3* 9.0*  HCT 31.9* 28.8*  MCV 92.7 89.4  PLT 221 219    Cardiac Enzymes: No results for input(s): CKTOTAL, CKMB, CKMBINDEX, TROPONINI in the last 168 hours.  BNP (last 3 results) No results for input(s): BNP in the last 8760 hours.  ProBNP (last 3 results) No results for input(s): PROBNP in the last 8760 hours.  Radiological Exams: DG Abd 1 View  Result Date: 07/31/2020 CLINICAL DATA:  NG tube placement. EXAM: ABDOMEN - 1 VIEW COMPARISON:  Single-view of the abdomen earlier today. FINDINGS: NG tube tip projects in the stomach. IMPRESSION: As above. Electronically Signed   By: Inge Rise M.D.   On: 07/31/2020 12:59   DG Abd Portable 1V  Result Date: 07/30/2020 CLINICAL DATA:  NG tube placement EXAM: PORTABLE ABDOMEN - 1 VIEW COMPARISON:  Abdominal radiograph 07/14/2020, chest radiograph 07/28/2020 FINDINGS: Transesophageal tube tip is positioned near the level of the GE junction. Consider advancing 3-5 cm for optimal function. Persistent left basilar density likely reflecting a combination of pleuroparenchymal disease as suspected on comparison chest radiograph. No high-grade obstructive bowel gas pattern or suspicious abdominal calcifications within the included margins of imaging. No acute or worrisome osseous abnormalities. Degenerative  changes in the spine and pelvis as included. IMPRESSION: 1. Transesophageal tube tip is positioned near the level of the GE junction. Consider advancing 3-5 cm for optimal function. 2. Persistent left basilar pleuroparenchymal disease. Electronically Signed   By: Lovena Le M.D.   On: 07/30/2020 23:47    Assessment/Plan Active Problems:   Acute on chronic respiratory failure with hypoxia (HCC)   Healthcare-associated pneumonia   Cardiac arrest (HCC)   Chronic atrial fibrillation ([place])   1. Acute on chronic respiratory  failure hypoxia we will continue with T collar on 40% FiO2. 2. Healthcare associated pneumonia treated slow improvement 3. Cardiac arrest rhythm stable 4. Chronic atrial fibrillation rate is controlled   I have personally seen and evaluated the patient, evaluated laboratory and imaging results, formulated the assessment and plan and placed orders. The Patient requires high complexity decision making with multiple systems involvement.  Rounds were done with the Respiratory Therapy Director and Staff therapists and discussed with nursing staff also.  Daniel Gee, MD [facility] Pulmonary Critical Care Medicine Sleep Medicine

## 2020-08-02 ENCOUNTER — Other Ambulatory Visit (HOSPITAL_COMMUNITY): Payer: Self-pay

## 2020-08-02 DIAGNOSIS — S02651A Fracture of angle of right mandible, initial encounter for closed fracture: Secondary | ICD-10-CM | POA: Diagnosis not present

## 2020-08-02 DIAGNOSIS — J9690 Respiratory failure, unspecified, unspecified whether with hypoxia or hypercapnia: Secondary | ICD-10-CM | POA: Diagnosis not present

## 2020-08-02 DIAGNOSIS — N179 Acute kidney failure, unspecified: Secondary | ICD-10-CM | POA: Diagnosis not present

## 2020-08-02 DIAGNOSIS — I4891 Unspecified atrial fibrillation: Secondary | ICD-10-CM | POA: Diagnosis not present

## 2020-08-02 DIAGNOSIS — K9423 Gastrostomy malfunction: Secondary | ICD-10-CM | POA: Diagnosis not present

## 2020-08-02 HISTORY — PX: IR REPLC GASTRO/COLONIC TUBE PERCUT W/FLUORO: IMG2333

## 2020-08-02 LAB — CBC
HCT: 32.1 % — ABNORMAL LOW (ref 39.0–52.0)
Hemoglobin: 9.3 g/dL — ABNORMAL LOW (ref 13.0–17.0)
MCH: 27.1 pg (ref 26.0–34.0)
MCHC: 29 g/dL — ABNORMAL LOW (ref 30.0–36.0)
MCV: 93.6 fL (ref 80.0–100.0)
Platelets: 225 10*3/uL (ref 150–400)
RBC: 3.43 MIL/uL — ABNORMAL LOW (ref 4.22–5.81)
RDW: 16.8 % — ABNORMAL HIGH (ref 11.5–15.5)
WBC: 11.8 10*3/uL — ABNORMAL HIGH (ref 4.0–10.5)
nRBC: 0 % (ref 0.0–0.2)

## 2020-08-02 LAB — BASIC METABOLIC PANEL
Anion gap: 10 (ref 5–15)
BUN: 72 mg/dL — ABNORMAL HIGH (ref 8–23)
CO2: 37 mmol/L — ABNORMAL HIGH (ref 22–32)
Calcium: 9.3 mg/dL (ref 8.9–10.3)
Chloride: 104 mmol/L (ref 98–111)
Creatinine, Ser: 1.58 mg/dL — ABNORMAL HIGH (ref 0.61–1.24)
GFR, Estimated: 44 mL/min — ABNORMAL LOW (ref >60)
Glucose, Bld: 152 mg/dL — ABNORMAL HIGH (ref 70–99)
Potassium: 2.8 mmol/L — ABNORMAL LOW (ref 3.5–5.1)
Sodium: 151 mmol/L — ABNORMAL HIGH (ref 135–145)

## 2020-08-02 IMAGING — XA IR REPLACE G-TUBE/COLONIC TUBE
4 series · 13 of 17 positions shown · non-contrast
Comparison: none

INDICATION: 80-year-old with a recently displaced gastrostomy tube. Foley
catheter has been temporarily placed in the stomach. Patient needs a
new long-term gastrostomy tube.

[Series 1: fl (-) angio · 2 of 3 slices shown (1 of 4)]
[im 1/3]
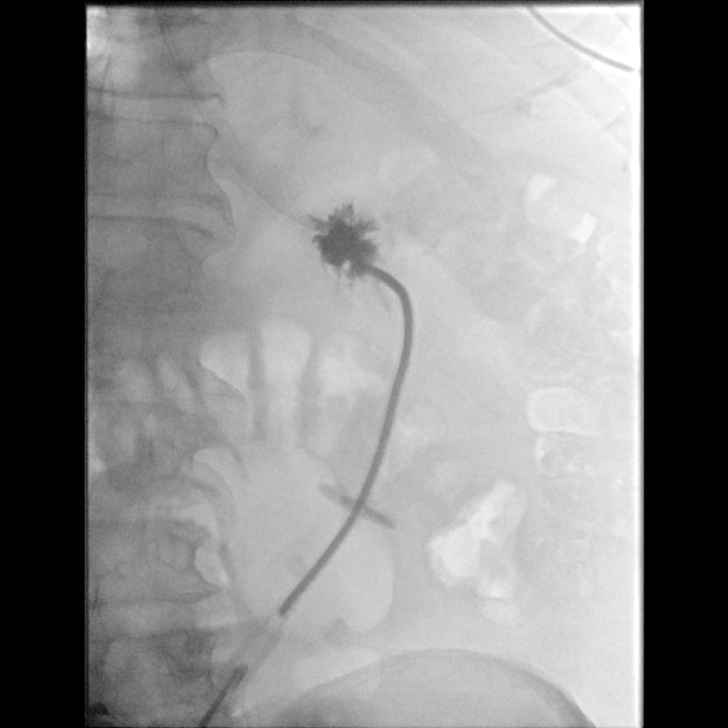
[im 2/3]
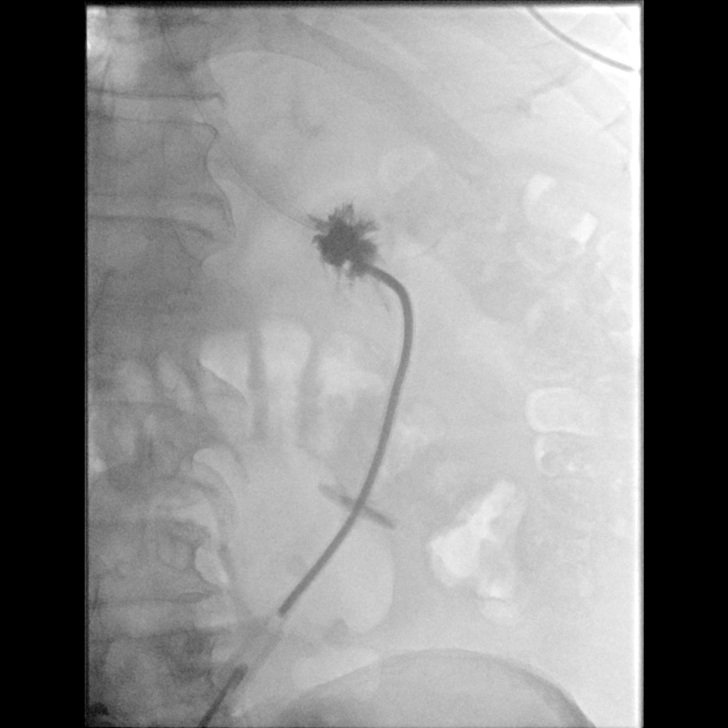

[Series 2: fl (-) angio · 2 of 2 slices shown (2 of 4)]
[im 1/2]
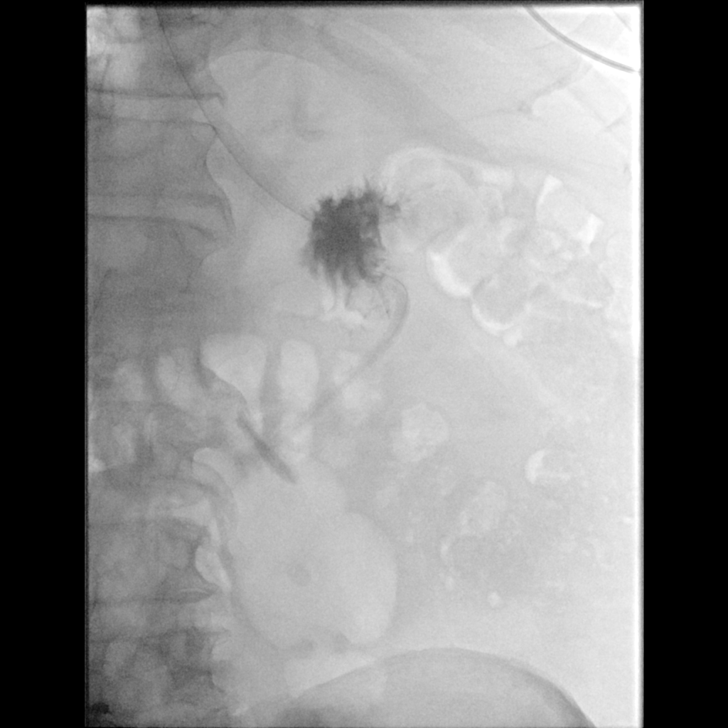
[im 2/2]
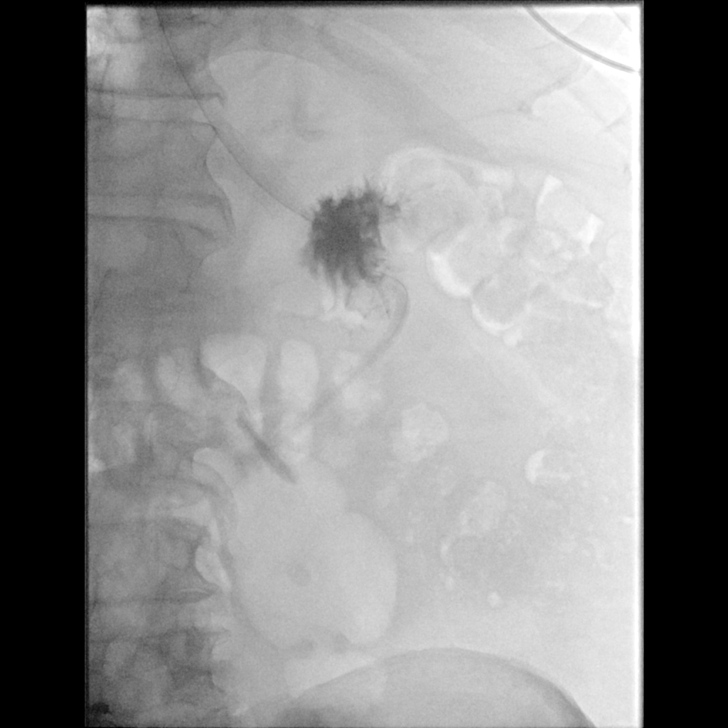

[Series 3: fl (-) angio · 7 of 10 slices shown (3 of 4)]
[im 1/10]
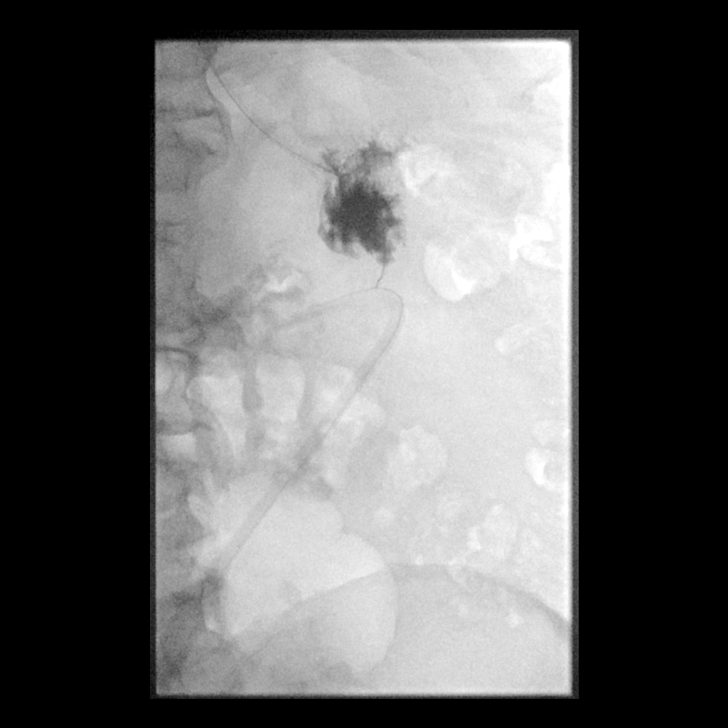
[im 3/10]
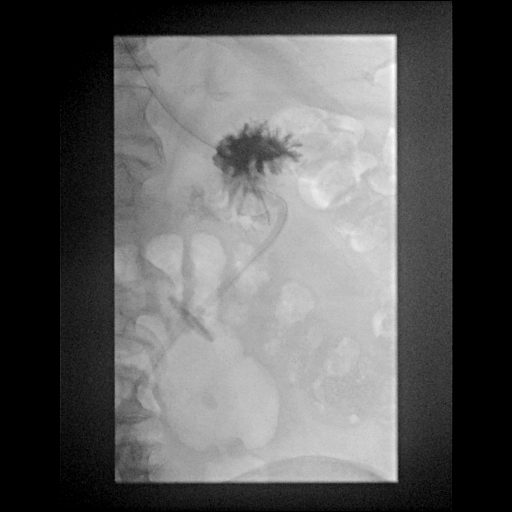
[im 4/10]
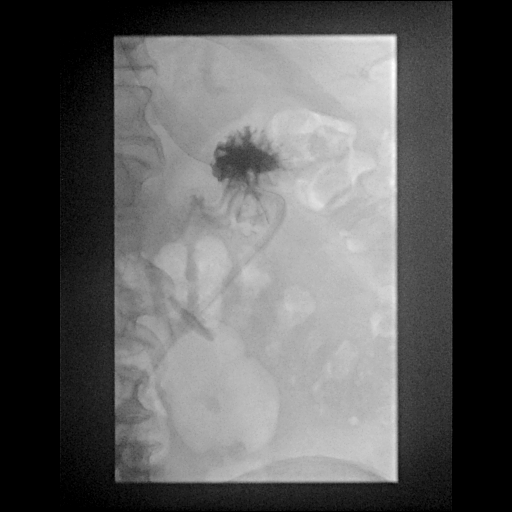
[im 5/10]
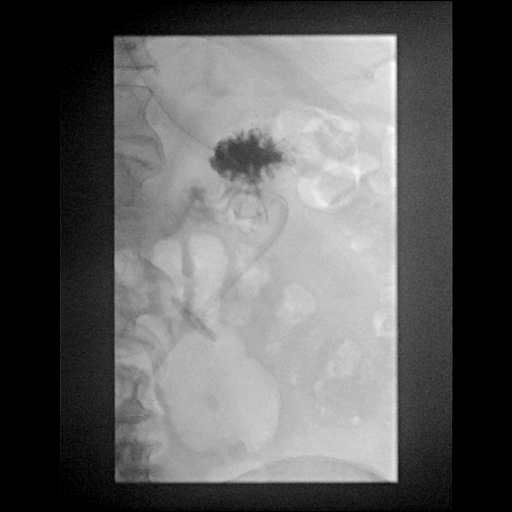
[im 7/10]
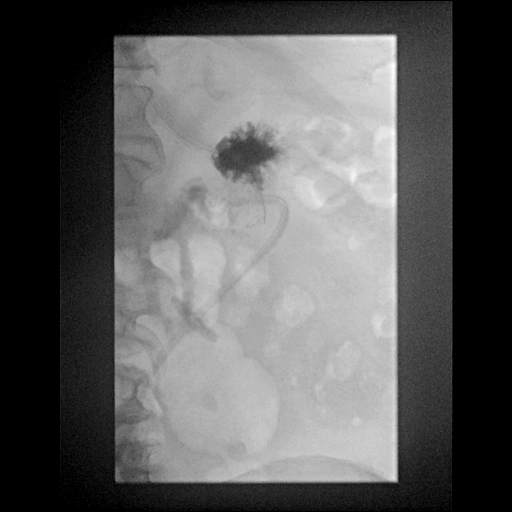
[im 8/10]
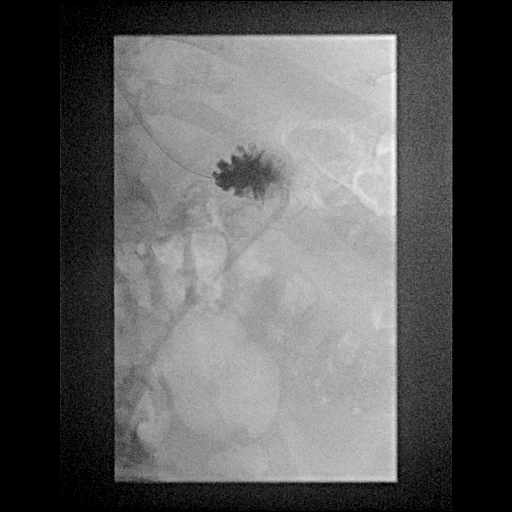
[im 9/10]
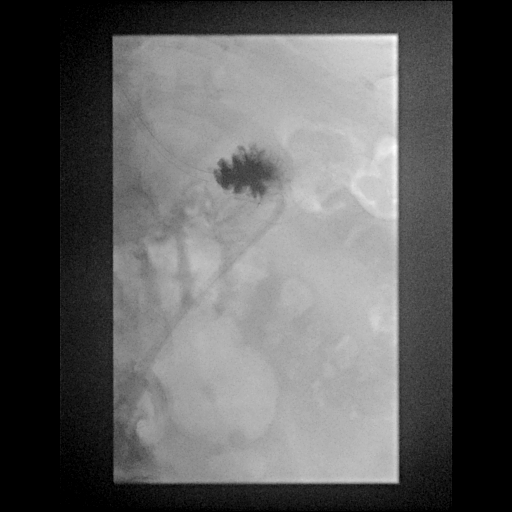

[Series 4: fl (-) angio · 2 of 2 slices shown (4 of 4)]
[im 1/2]
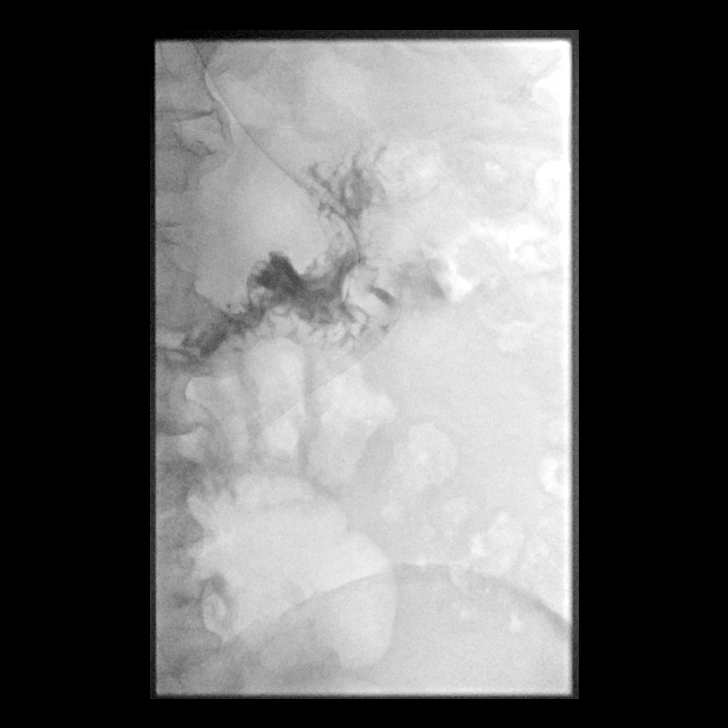
[im 2/2]
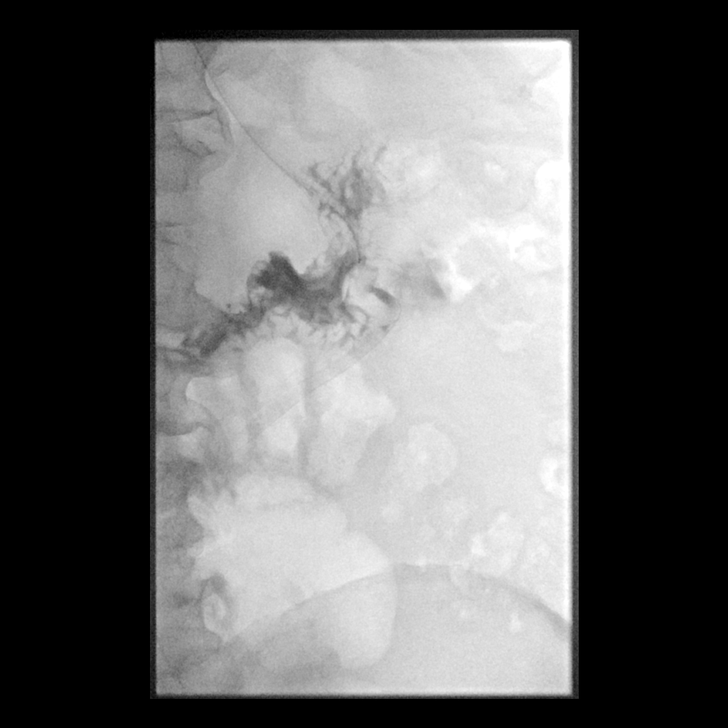

[13 of 17 positions shown; findings below may reference images not displayed]

EXAM:
GASTROSTOMY TUBE EXCHANGE WITH FLUOROSCOPY

MEDICATIONS:
None

ANESTHESIA/SEDATION:
None

CONTRAST:  8 mL Omnipaque 300-administered into the gastric lumen.

FLUOROSCOPY TIME:  Fluoroscopy Time: 30 seconds, 4 mGy

COMPLICATIONS:
None immediate.

PROCEDURE:
Patient was placed supine on the interventional table. The anterior
abdomen and existing tube were prepped and draped in sterile
fashion. The Foley catheter balloon was deflated and the tube was
easily removed. A 20 French balloon retention gastrostomy tube was
easily advanced in the stomach. The balloon was inflated with 8 mL
of saline. Contrast injection confirmed placement in the stomach.
Tube was flushed with saline.

Fluoroscopic images were taken and saved for this procedure.
FINDINGS: Gastrostomy tube is positioned in the stomach.
IMPRESSION: Successful exchange of the gastrostomy tube. Patient currently has a
20 French balloon retention catheter.

## 2020-08-02 MED ORDER — LIDOCAINE VISCOUS HCL 2 % MT SOLN
OROMUCOSAL | Status: AC
  Filled 2020-08-02: qty 15

## 2020-08-02 MED ORDER — IOHEXOL 300 MG/ML  SOLN
50.0000 mL | Freq: Once | INTRAMUSCULAR | Status: AC | PRN
  Administered 2020-08-02: 8 mL

## 2020-08-02 NOTE — Procedures (Signed)
Interventional Radiology Procedure:   Indications: Displaced gastrostomy  Procedure: Gastrostomy tube replacement  Findings: New 20 Fr gastrostomy tube placed.   8 ml of saline placed in balloon.  Complications: None     EBL: None  Plan: Gastrostomy is ready to be used.    Shunna Mikaelian R. Anselm Pancoast, MD  Pager: [phone]
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
## 2020-08-03 DIAGNOSIS — E870 Hyperosmolality and hypernatremia: Secondary | ICD-10-CM | POA: Diagnosis not present

## 2020-08-03 DIAGNOSIS — I4891 Unspecified atrial fibrillation: Secondary | ICD-10-CM | POA: Diagnosis not present

## 2020-08-03 DIAGNOSIS — N179 Acute kidney failure, unspecified: Secondary | ICD-10-CM | POA: Diagnosis not present

## 2020-08-03 DIAGNOSIS — J9690 Respiratory failure, unspecified, unspecified whether with hypoxia or hypercapnia: Secondary | ICD-10-CM | POA: Diagnosis not present

## 2020-08-03 LAB — BASIC METABOLIC PANEL
Anion gap: 9 (ref 5–15)
BUN: 65 mg/dL — ABNORMAL HIGH (ref 8–23)
CO2: 39 mmol/L — ABNORMAL HIGH (ref 22–32)
Calcium: 9.4 mg/dL (ref 8.9–10.3)
Chloride: 107 mmol/L (ref 98–111)
Creatinine, Ser: 1.58 mg/dL — ABNORMAL HIGH (ref 0.61–1.24)
GFR, Estimated: 44 mL/min — ABNORMAL LOW (ref >60)
Glucose, Bld: 122 mg/dL — ABNORMAL HIGH (ref 70–99)
Potassium: 3.1 mmol/L — ABNORMAL LOW (ref 3.5–5.1)
Sodium: 155 mmol/L — ABNORMAL HIGH (ref 135–145)

## 2020-08-03 NOTE — Progress Notes (Signed)
Pulmonary Critical Care Medicine [place]   PULMONARY CRITICAL CARE SERVICE  PROGRESS NOTE  Date of Service: 08/03/2020   Daniel [name]  [id number]:[id number]  DOB: [birth year]   DOA: 07/08/2020  Referring Physician: Merton Border, MD  HPI: Daniel [name] is a 80 y.o. male seen for follow up of Acute on Chronic Respiratory Failure.  Patient remains off the ventilator only completing 72 hours right now is on T collar  Medications: Reviewed on Rounds  Physical Exam:  Vitals: Temperature is 98.2 pulse 112 respiratory 18 blood pressure is 151/92 saturations 99%  Ventilator Settings off the vent on T collar  . General: Comfortable at this time . Eyes: Grossly normal lids, irises & conjunctiva . ENT: grossly tongue is normal . Neck: no obvious mass . Cardiovascular: S1 S2 normal no gallop . Respiratory: No rhonchi no rales noted at this time . Abdomen: soft . Skin: no rash seen on limited exam . Musculoskeletal: not rigid . Psychiatric:unable to assess . Neurologic: no seizure no involuntary movements         Lab Data:   Basic Metabolic Panel: Recent Labs  Lab 07/29/20 0416 07/31/20 0319 08/01/20 0900 08/02/20 0601 08/03/20 0449  NA 142 150*  --  151* 155*  K 4.0 3.1* 3.1* 2.8* 3.1*  CL 99 106  --  104 107  CO2 33* 34*  --  37* 39*  GLUCOSE 159* 150*  --  152* 122*  BUN 92* 94*  --  72* 65*  CREATININE 2.06* 2.11*  --  1.58* 1.58*  CALCIUM 9.5 9.4  --  9.3 9.4  MG 2.5* 2.2  --   --   --     ABG: Recent Labs  Lab 07/28/20 0455 07/28/20 0725 07/29/20 1230 08/01/20 0443  PHART 7.290* 7.425 7.330* 7.393  PCO2ART 74.3* 51.6* 65.1* 65.0*  PO2ART 62.1* 61.6* 69.5* 95.3  HCO3 34.6* 33.3* 33.5* 38.8*  O2SAT 90.0 93.1 93.9 97.8    Liver Function Tests: No results for input(s): AST, ALT, ALKPHOS, BILITOT, PROT, ALBUMIN in the last 168 hours. No results for input(s): LIPASE, AMYLASE in the last 168 hours. No results for input(s): AMMONIA in the  last 168 hours.  CBC: Recent Labs  Lab 07/29/20 0416 07/31/20 0319 08/02/20 0601  WBC 12.0* 13.0* 11.8*  HGB 9.3* 9.0* 9.3*  HCT 31.9* 28.8* 32.1*  MCV 92.7 89.4 93.6  PLT 221 219 225    Cardiac Enzymes: No results for input(s): CKTOTAL, CKMB, CKMBINDEX, TROPONINI in the last 168 hours.  BNP (last 3 results) No results for input(s): BNP in the last 8760 hours.  ProBNP (last 3 results) No results for input(s): PROBNP in the last 8760 hours.  Radiological Exams: IR Replc Gastro/Colonic Tube Percut W/Fluoro  Result Date: 08/02/2020 INDICATION: 80 year old with a recently displaced gastrostomy tube. Foley catheter has been temporarily placed in the stomach. Patient needs a new long-term gastrostomy tube. EXAM: GASTROSTOMY TUBE EXCHANGE WITH FLUOROSCOPY MEDICATIONS: None ANESTHESIA/SEDATION: None CONTRAST:  8 mL Omnipaque 300-administered into the gastric lumen. FLUOROSCOPY TIME:  Fluoroscopy Time: 30 seconds, 4 mGy COMPLICATIONS: None immediate. PROCEDURE: Patient was placed supine on the interventional table. The anterior abdomen and existing tube were prepped and draped in sterile fashion. The Foley catheter balloon was deflated and the tube was easily removed. A 20 French balloon retention gastrostomy tube was easily advanced in the stomach. The balloon was inflated with 8 mL of saline. Contrast injection confirmed placement in the stomach. Tube was flushed  with saline. Fluoroscopic images were taken and saved for this procedure. FINDINGS: Gastrostomy tube is positioned in the stomach. IMPRESSION: Successful exchange of the gastrostomy tube. Patient currently has a 32 [place] balloon retention catheter. Electronically Signed   By: Markus Daft M.D.   On: 08/02/2020 17:03    Assessment/Plan Active Problems:   Acute on chronic respiratory failure with hypoxia (HCC)   Healthcare-associated pneumonia   Cardiac arrest (HCC)   Chronic atrial fibrillation ([place])   1. Acute on chronic  respiratory failure hypoxia doing well so far off the ventilator.  We will continue 2. Healthcare associated pneumonia supportive care 3. Cardiac arrest rhythm has been stable 4. Chronic atrial fibrillation rate is controlled cardiology following along   I have personally seen and evaluated the patient, evaluated laboratory and imaging results, formulated the assessment and plan and placed orders. The Patient requires high complexity decision making with multiple systems involvement.  Rounds were done with the Respiratory Therapy Director and Staff therapists and discussed with nursing staff also.  Daniel Gee, MD [facility] Pulmonary Critical Care Medicine Sleep Medicine

## 2020-08-03 NOTE — Progress Notes (Signed)
Pulmonary [name]  PROGRESS NOTE    MCGWIRE DASARO  GEX:[id number]  DOB: 26-Feb-1941   DOA: 07/08/2020  Referring Physician: Merton Border, MD  HPI: Daniel [name] is a 80 y.o. male seen for follow up of Acute on Chronic Respiratory Failure.  Patient currently is afebrile resting comfortably without distress remains on T collar goal today is 48-hour  Medications: Reviewed on Rounds  Physical Exam:  Vitals: Temperature 96.4 pulse 88 respiratory 15 blood pressure is 151/84 saturations 98%  Ventilator Settings on T collar with an FiO2 of 30%  . General: Comfortable at this time . Eyes: Grossly normal lids, irises & conjunctiva . ENT: grossly tongue is normal . Neck: no obvious mass . Cardiovascular: S1 S2 normal no gallop . Respiratory: Scattered rhonchi very coarse breath sounds . Abdomen: soft . Skin: no rash seen on limited exam . Musculoskeletal: not rigid . Psychiatric:unable to assess . Neurologic: no seizure no involuntary movements         Lab Data:   Basic Metabolic Panel: Recent Labs  Lab 07/29/20 0416 07/31/20 0319 08/01/20 0900 08/02/20 0601 08/03/20 0449  NA 142 150*  --  151* 155*  K 4.0 3.1* 3.1* 2.8* 3.1*  CL 99 106  --  104 107  CO2 33* 34*  --  37* 39*  GLUCOSE 159* 150*  --  152* 122*  BUN 92* 94*  --  72* 65*  CREATININE 2.06* 2.11*  --  1.58* 1.58*  CALCIUM 9.5 9.4  --  9.3 9.4  MG 2.5* 2.2  --   --   --     ABG: Recent Labs  Lab 07/28/20 0455 07/28/20 0725 07/29/20 1230 08/01/20 0443  PHART 7.290* 7.425 7.330* 7.393  PCO2ART 74.3* 51.6* 65.1* 65.0*  PO2ART 62.1* 61.6* 69.5* 95.3  HCO3 34.6* 33.3* 33.5* 38.8*  O2SAT 90.0 93.1 93.9 97.8    Liver Function Tests: No results for input(s): AST, ALT, ALKPHOS, BILITOT, PROT, ALBUMIN in the last 168 hours. No results for input(s): LIPASE, AMYLASE in the last 168 hours. No results for input(s): AMMONIA in  the last 168 hours.  CBC: Recent Labs  Lab 07/29/20 0416 07/31/20 0319 08/02/20 0601  WBC 12.0* 13.0* 11.8*  HGB 9.3* 9.0* 9.3*  HCT 31.9* 28.8* 32.1*  MCV 92.7 89.4 93.6  PLT 221 219 225    Cardiac Enzymes: No results for input(s): CKTOTAL, CKMB, CKMBINDEX, TROPONINI in the last 168 hours.  BNP (last 3 results) No results for input(s): BNP in the last 8760 hours.  ProBNP (last 3 results) No results for input(s): PROBNP in the last 8760 hours.  Radiological Exams: IR Replc Gastro/Colonic Tube Percut W/Fluoro  Result Date: 08/02/2020 INDICATION: 80 year old with a recently displaced gastrostomy tube. Foley catheter has been temporarily placed in the stomach. Patient needs a new long-term gastrostomy tube. EXAM: GASTROSTOMY TUBE EXCHANGE WITH FLUOROSCOPY MEDICATIONS: None ANESTHESIA/SEDATION: None CONTRAST:  8 mL Omnipaque 300-administered into the gastric lumen. FLUOROSCOPY TIME:  Fluoroscopy Time: 30 seconds, 4 mGy COMPLICATIONS: None immediate. PROCEDURE: Patient was placed supine on the interventional table. The anterior abdomen and existing tube were prepped and draped in sterile fashion. The Foley catheter balloon was deflated and the tube was easily removed. A 20 French balloon retention gastrostomy tube was easily advanced in the stomach. The balloon was inflated with 8 mL of saline. Contrast injection confirmed placement in the stomach. Tube was flushed with saline. Fluoroscopic images  were taken and saved for this procedure. FINDINGS: Gastrostomy tube is positioned in the stomach. IMPRESSION: Successful exchange of the gastrostomy tube. Patient currently has a 47 [place] balloon retention catheter. Electronically Signed   By: Markus Daft M.D.   On: 08/02/2020 17:03    Assessment/Plan Active Problems:   Acute on chronic respiratory failure with hypoxia (HCC)   Healthcare-associated pneumonia   Cardiac arrest (HCC)   Chronic atrial fibrillation ([place])   1. Acute on chronic  respiratory failure hypoxia we will continue with T collar trials on 30% FiO2. 2. Healthcare associated pneumonia treated slowly improving 3. Cardiac arrest rhythm is stable 4. Chronic atrial fibrillation rate is controlled at this time   I have personally seen and evaluated the patient, evaluated laboratory and imaging results, formulated the assessment and plan and placed orders. The Patient requires high complexity decision making with multiple systems involvement.  Rounds were done with the Respiratory Therapy Director and Staff therapists and discussed with nursing staff also.  [name], MD [facility] Pulmonary Critical Care Medicine Sleep Medicine

## 2020-08-04 DIAGNOSIS — N179 Acute kidney failure, unspecified: Secondary | ICD-10-CM | POA: Diagnosis not present

## 2020-08-04 DIAGNOSIS — J9690 Respiratory failure, unspecified, unspecified whether with hypoxia or hypercapnia: Secondary | ICD-10-CM | POA: Diagnosis not present

## 2020-08-04 DIAGNOSIS — I4891 Unspecified atrial fibrillation: Secondary | ICD-10-CM | POA: Diagnosis not present

## 2020-08-04 DIAGNOSIS — S02651A Fracture of angle of right mandible, initial encounter for closed fracture: Secondary | ICD-10-CM | POA: Diagnosis not present

## 2020-08-04 LAB — URINALYSIS, ROUTINE W REFLEX MICROSCOPIC
Bilirubin Urine: NEGATIVE
Glucose, UA: NEGATIVE mg/dL
Ketones, ur: NEGATIVE mg/dL
Nitrite: NEGATIVE
Protein, ur: 30 mg/dL — AB
RBC / HPF: >50 RBC/hpf — ABNORMAL HIGH (ref 0–5)
Specific Gravity, Urine: 1.014 (ref 1.005–1.030)
pH: 9 — ABNORMAL HIGH (ref 5.0–8.0)

## 2020-08-04 LAB — BASIC METABOLIC PANEL WITH GFR
Anion gap: 9 (ref 5–15)
BUN: 67 mg/dL — ABNORMAL HIGH (ref 8–23)
CO2: 37 mmol/L — ABNORMAL HIGH (ref 22–32)
Calcium: 9.4 mg/dL (ref 8.9–10.3)
Chloride: 106 mmol/L (ref 98–111)
Creatinine, Ser: 1.6 mg/dL — ABNORMAL HIGH (ref 0.61–1.24)
GFR, Estimated: 44 mL/min — ABNORMAL LOW
Glucose, Bld: 129 mg/dL — ABNORMAL HIGH (ref 70–99)
Potassium: 3 mmol/L — ABNORMAL LOW (ref 3.5–5.1)
Sodium: 152 mmol/L — ABNORMAL HIGH (ref 135–145)

## 2020-08-04 LAB — BLOOD GAS, ARTERIAL
Acid-Base Excess: 15 mmol/L — ABNORMAL HIGH (ref 0.0–2.0)
Bicarbonate: 39.7 mmol/L — ABNORMAL HIGH (ref 20.0–28.0)
FIO2: 28
O2 Saturation: 92.8 %
Patient temperature: 37.8
pCO2 arterial: 56.8 mmHg — ABNORMAL HIGH (ref 32.0–48.0)
pH, Arterial: 7.462 — ABNORMAL HIGH (ref 7.350–7.450)
pO2, Arterial: 66.1 mmHg — ABNORMAL LOW (ref 83.0–108.0)

## 2020-08-04 NOTE — Progress Notes (Signed)
Pulmonary Critical Care Medicine [place]   PULMONARY CRITICAL CARE SERVICE  PROGRESS NOTE  Date of Service: 08/04/2020   Daniel [name]  YCX:[id number]  DOB: [birth year]   DOA: 07/08/2020  Referring Physician: Merton Border, MD  HPI: Daniel [name] is a 80 y.o. male seen for follow up of Acute on Chronic Respiratory Failure.  Patient is comfortable right now without distress had a low-grade temperature yesterday but is actually weaning off the ventilator now for 96 hours  Medications: Reviewed on Rounds  Physical Exam:  Vitals: Temperature 100.5 pulse 110 respiratory 22 blood pressure is 161/100 saturations 95%  Ventilator Settings on T collar FiO2 28%  . General: Comfortable at this time . Eyes: Grossly normal lids, irises & conjunctiva . ENT: grossly tongue is normal . Neck: no obvious mass . Cardiovascular: S1 S2 normal no gallop . Respiratory: Scattered rhonchi expansion is equal . Abdomen: soft . Skin: no rash seen on limited exam . Musculoskeletal: not rigid . Psychiatric:unable to assess . Neurologic: no seizure no involuntary movements         Lab Data:   Basic Metabolic Panel: Recent Labs  Lab 07/29/20 0416 07/31/20 0319 08/01/20 0900 08/02/20 0601 08/03/20 0449 08/04/20 0422  NA 142 150*  --  151* 155* 152*  K 4.0 3.1* 3.1* 2.8* 3.1* 3.0*  CL 99 106  --  104 107 106  CO2 33* 34*  --  37* 39* 37*  GLUCOSE 159* 150*  --  152* 122* 129*  BUN 92* 94*  --  72* 65* 67*  CREATININE 2.06* 2.11*  --  1.58* 1.58* 1.60*  CALCIUM 9.5 9.4  --  9.3 9.4 9.4  MG 2.5* 2.2  --   --   --   --     ABG: Recent Labs  Lab 07/29/20 1230 08/01/20 0443 08/04/20 0415  PHART 7.330* 7.393 7.462*  PCO2ART 65.1* 65.0* 56.8*  PO2ART 69.5* 95.3 66.1*  HCO3 33.5* 38.8* 39.7*  O2SAT 93.9 97.8 92.8    Liver Function Tests: No results for input(s): AST, ALT, ALKPHOS, BILITOT, PROT, ALBUMIN in the last 168 hours. No results for input(s): LIPASE,  AMYLASE in the last 168 hours. No results for input(s): AMMONIA in the last 168 hours.  CBC: Recent Labs  Lab 07/29/20 0416 07/31/20 0319 08/02/20 0601  WBC 12.0* 13.0* 11.8*  HGB 9.3* 9.0* 9.3*  HCT 31.9* 28.8* 32.1*  MCV 92.7 89.4 93.6  PLT 221 219 225    Cardiac Enzymes: No results for input(s): CKTOTAL, CKMB, CKMBINDEX, TROPONINI in the last 168 hours.  BNP (last 3 results) No results for input(s): BNP in the last 8760 hours.  ProBNP (last 3 results) No results for input(s): PROBNP in the last 8760 hours.  Radiological Exams: IR Replc Gastro/Colonic Tube Percut W/Fluoro  Result Date: 08/02/2020 INDICATION: 80 year old with a recently displaced gastrostomy tube. Foley catheter has been temporarily placed in the stomach. Patient needs a new long-term gastrostomy tube. EXAM: GASTROSTOMY TUBE EXCHANGE WITH FLUOROSCOPY MEDICATIONS: None ANESTHESIA/SEDATION: None CONTRAST:  8 mL Omnipaque 300-administered into the gastric lumen. FLUOROSCOPY TIME:  Fluoroscopy Time: 30 seconds, 4 mGy COMPLICATIONS: None immediate. PROCEDURE: Patient was placed supine on the interventional table. The anterior abdomen and existing tube were prepped and draped in sterile fashion. The Foley catheter balloon was deflated and the tube was easily removed. A 20 French balloon retention gastrostomy tube was easily advanced in the stomach. The balloon was inflated with 8 mL of saline. Contrast  injection confirmed placement in the stomach. Tube was flushed with saline. Fluoroscopic images were taken and saved for this procedure. FINDINGS: Gastrostomy tube is positioned in the stomach. IMPRESSION: Successful exchange of the gastrostomy tube. Patient currently has a 36 [place] balloon retention catheter. Electronically Signed   By: Markus Daft M.D.   On: 08/02/2020 17:03    Assessment/Plan Active Problems:   Acute on chronic respiratory failure with hypoxia (HCC)   Healthcare-associated pneumonia   Cardiac arrest  (HCC)   Chronic atrial fibrillation ([place])   1. Acute on chronic respiratory failure hypoxia we will continue with T collar trials titrate oxygen continue pulmonary toilet. 2. Healthcare associated pneumonia treated supportive care 3. Cardiac arrest rhythm stable 4. Chronic atrial fibrillation rate is controlled at this time we will continue to monitor   I have personally seen and evaluated the patient, evaluated laboratory and imaging results, formulated the assessment and plan and placed orders. The Patient requires high complexity decision making with multiple systems involvement.  Rounds were done with the Respiratory Therapy Director and Staff therapists and discussed with nursing staff also.  [name], MD [facility] Pulmonary Critical Care Medicine Sleep Medicine

## 2020-08-05 DIAGNOSIS — I4891 Unspecified atrial fibrillation: Secondary | ICD-10-CM | POA: Diagnosis not present

## 2020-08-05 DIAGNOSIS — N179 Acute kidney failure, unspecified: Secondary | ICD-10-CM | POA: Diagnosis not present

## 2020-08-05 DIAGNOSIS — S02651A Fracture of angle of right mandible, initial encounter for closed fracture: Secondary | ICD-10-CM | POA: Diagnosis not present

## 2020-08-05 DIAGNOSIS — J9690 Respiratory failure, unspecified, unspecified whether with hypoxia or hypercapnia: Secondary | ICD-10-CM | POA: Diagnosis not present

## 2020-08-05 LAB — BASIC METABOLIC PANEL
Anion gap: 8 (ref 5–15)
BUN: 66 mg/dL — ABNORMAL HIGH (ref 8–23)
CO2: 35 mmol/L — ABNORMAL HIGH (ref 22–32)
Calcium: 9.1 mg/dL (ref 8.9–10.3)
Chloride: 108 mmol/L (ref 98–111)
Creatinine, Ser: 1.51 mg/dL — ABNORMAL HIGH (ref 0.61–1.24)
GFR, Estimated: 47 mL/min — ABNORMAL LOW (ref >60)
Glucose, Bld: 150 mg/dL — ABNORMAL HIGH (ref 70–99)
Potassium: 3.4 mmol/L — ABNORMAL LOW (ref 3.5–5.1)
Sodium: 151 mmol/L — ABNORMAL HIGH (ref 135–145)

## 2020-08-05 LAB — CBC
HCT: 32 % — ABNORMAL LOW (ref 39.0–52.0)
Hemoglobin: 9.4 g/dL — ABNORMAL LOW (ref 13.0–17.0)
MCH: 27.6 pg (ref 26.0–34.0)
MCHC: 29.4 g/dL — ABNORMAL LOW (ref 30.0–36.0)
MCV: 94.1 fL (ref 80.0–100.0)
Platelets: 258 10*3/uL (ref 150–400)
RBC: 3.4 MIL/uL — ABNORMAL LOW (ref 4.22–5.81)
RDW: 17.1 % — ABNORMAL HIGH (ref 11.5–15.5)
WBC: 14.4 10*3/uL — ABNORMAL HIGH (ref 4.0–10.5)
nRBC: 0.1 % (ref 0.0–0.2)

## 2020-08-05 LAB — URINE CULTURE

## 2020-08-06 DIAGNOSIS — I4891 Unspecified atrial fibrillation: Secondary | ICD-10-CM | POA: Diagnosis not present

## 2020-08-06 DIAGNOSIS — N179 Acute kidney failure, unspecified: Secondary | ICD-10-CM | POA: Diagnosis not present

## 2020-08-06 DIAGNOSIS — D72829 Elevated white blood cell count, unspecified: Secondary | ICD-10-CM | POA: Diagnosis not present

## 2020-08-06 DIAGNOSIS — J9621 Acute and chronic respiratory failure with hypoxia: Secondary | ICD-10-CM | POA: Diagnosis not present

## 2020-08-06 DIAGNOSIS — S02651A Fracture of angle of right mandible, initial encounter for closed fracture: Secondary | ICD-10-CM | POA: Diagnosis not present

## 2020-08-06 DIAGNOSIS — J9690 Respiratory failure, unspecified, unspecified whether with hypoxia or hypercapnia: Secondary | ICD-10-CM | POA: Diagnosis not present

## 2020-08-06 LAB — BASIC METABOLIC PANEL
Anion gap: 9 (ref 5–15)
BUN: 66 mg/dL — ABNORMAL HIGH (ref 8–23)
CO2: 35 mmol/L — ABNORMAL HIGH (ref 22–32)
Calcium: 9 mg/dL (ref 8.9–10.3)
Chloride: 104 mmol/L (ref 98–111)
Creatinine, Ser: 1.36 mg/dL — ABNORMAL HIGH (ref 0.61–1.24)
GFR, Estimated: 53 mL/min — ABNORMAL LOW (ref >60)
Glucose, Bld: 167 mg/dL — ABNORMAL HIGH (ref 70–99)
Potassium: 2.7 mmol/L — CL (ref 3.5–5.1)
Sodium: 148 mmol/L — ABNORMAL HIGH (ref 135–145)

## 2020-08-06 LAB — POTASSIUM: Potassium: 2.5 mmol/L — CL (ref 3.5–5.1)

## 2020-08-06 MED ORDER — IOHEXOL 9 MG/ML PO SOLN
ORAL | Status: AC
  Filled 2020-08-06: qty 1000

## 2020-08-06 MED ORDER — IOHEXOL 9 MG/ML PO SOLN: 500.0000 mL | ORAL | Status: AC

## 2020-08-06 NOTE — Progress Notes (Signed)
Pulmonary Critical Care Medicine [place]   PULMONARY CRITICAL CARE SERVICE  PROGRESS NOTE  Date of Service: 08/06/2020   Daniel [name]  UEA:[id number]  DOB: 03/07/1941   DOA: 07/08/2020  Referring Physician: Merton Border, MD  HPI: Daniel [name] is a 80 y.o. male seen for follow up of Acute on Chronic Respiratory Failure.  Patient is resting comfortably right now without distress has been on T-piece should be able to start the PMV  Medications: Reviewed on Rounds  Physical Exam:  Vitals: Temperature is 97.6 pulse 98 respiratory 22 blood pressure is 146/88 saturations 99  Ventilator Settings off the ventilator on T-piece  . General: Comfortable at this time . Eyes: Grossly normal lids, irises & conjunctiva . ENT: grossly tongue is normal . Neck: no obvious mass . Cardiovascular: S1 S2 normal no gallop . Respiratory: Scattered rhonchi expansion is equal . Abdomen: soft . Skin: no rash seen on limited exam . Musculoskeletal: not rigid . Psychiatric:unable to assess . Neurologic: no seizure no involuntary movements         Lab Data:   Basic Metabolic Panel: Recent Labs  Lab 07/31/20 0319 08/01/20 0900 08/02/20 0601 08/03/20 0449 08/04/20 0422 08/05/20 0352 08/06/20 0450  NA 150*  --  151* 155* 152* 151* 148*  K 3.1*   < > 2.8* 3.1* 3.0* 3.4* 2.7*  CL 106  --  104 107 106 108 104  CO2 34*  --  37* 39* 37* 35* 35*  GLUCOSE 150*  --  152* 122* 129* 150* 167*  BUN 94*  --  72* 65* 67* 66* 66*  CREATININE 2.11*  --  1.58* 1.58* 1.60* 1.51* 1.36*  CALCIUM 9.4  --  9.3 9.4 9.4 9.1 9.0  MG 2.2  --   --   --   --   --   --    < > = values in this interval not displayed.    ABG: Recent Labs  Lab 08/01/20 0443 08/04/20 0415  PHART 7.393 7.462*  PCO2ART 65.0* 56.8*  PO2ART 95.3 66.1*  HCO3 38.8* 39.7*  O2SAT 97.8 92.8    Liver Function Tests: No results for input(s): AST, ALT, ALKPHOS, BILITOT, PROT, ALBUMIN in the last 168 hours. No  results for input(s): LIPASE, AMYLASE in the last 168 hours. No results for input(s): AMMONIA in the last 168 hours.  CBC: Recent Labs  Lab 07/31/20 0319 08/02/20 0601 08/05/20 0352  WBC 13.0* 11.8* 14.4*  HGB 9.0* 9.3* 9.4*  HCT 28.8* 32.1* 32.0*  MCV 89.4 93.6 94.1  PLT 219 225 258    Cardiac Enzymes: No results for input(s): CKTOTAL, CKMB, CKMBINDEX, TROPONINI in the last 168 hours.  BNP (last 3 results) No results for input(s): BNP in the last 8760 hours.  ProBNP (last 3 results) No results for input(s): PROBNP in the last 8760 hours.  Radiological Exams: No results found.  Assessment/Plan Active Problems:   Acute on chronic respiratory failure with hypoxia (HCC)   Healthcare-associated pneumonia   Cardiac arrest (HCC)   Chronic atrial fibrillation ([place])   1. Acute on chronic respiratory failure hypoxia plan is to continue with T-piece as tolerated use PMV. 2. Healthcare associated pneumonia treated we will continue to follow 3. Cardiac arrest rhythm stable 4. Chronic atrial fibrillation rate is controlled   I have personally seen and evaluated the patient, evaluated laboratory and imaging results, formulated the assessment and plan and placed orders. The Patient requires high complexity decision making with  multiple systems involvement.  Rounds were done with the Respiratory Therapy Director and Staff therapists and discussed with nursing staff also.  [name], MD [facility] Pulmonary Critical Care Medicine Sleep Medicine
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
## 2020-08-06 NOTE — Progress Notes (Signed)
Pulmonary [place]  PROGRESS NOTE     DONTARIO EVETTS  PYP:[id number]  DOB: [birth year]   DOA: 07/08/2020  Referring Physician: Merton Border, MD  HPI: Daniel [name] is a 80 y.o. male seen for follow up of Acute on Chronic Respiratory Failure.  Patient is afebrile comfortable right now without distress remains on T collar  Medications: Reviewed on Rounds  Physical Exam:  Vitals: Temperature is 98.3 pulse 106 respiratory 20 blood pressure is 142/86 saturations 99  Ventilator Settings off the ventilator currently on T collar  . General: Comfortable at this time . Eyes: Grossly normal lids, irises & conjunctiva . ENT: grossly tongue is normal . Neck: no obvious mass . Cardiovascular: S1 S2 normal no gallop . Respiratory: No rhonchi very coarse breath sound . Abdomen: soft . Skin: no rash seen on limited exam . Musculoskeletal: not rigid . Psychiatric:unable to assess . Neurologic: no seizure no involuntary movements         Lab Data:   Basic Metabolic Panel: Recent Labs  Lab 07/31/20 0319 08/01/20 0900 08/02/20 0601 08/03/20 0449 08/04/20 0422 08/05/20 0352 08/06/20 0450  NA 150*  --  151* 155* 152* 151* 148*  K 3.1*   < > 2.8* 3.1* 3.0* 3.4* 2.7*  CL 106  --  104 107 106 108 104  CO2 34*  --  37* 39* 37* 35* 35*  GLUCOSE 150*  --  152* 122* 129* 150* 167*  BUN 94*  --  72* 65* 67* 66* 66*  CREATININE 2.11*  --  1.58* 1.58* 1.60* 1.51* 1.36*  CALCIUM 9.4  --  9.3 9.4 9.4 9.1 9.0  MG 2.2  --   --   --   --   --   --    < > = values in this interval not displayed.    ABG: Recent Labs  Lab 08/01/20 0443 08/04/20 0415  PHART 7.393 7.462*  PCO2ART 65.0* 56.8*  PO2ART 95.3 66.1*  HCO3 38.8* 39.7*  O2SAT 97.8 92.8    Liver Function Tests: No results for input(s): AST, ALT, ALKPHOS, BILITOT, PROT, ALBUMIN in the last 168 hours. No results for input(s): LIPASE, AMYLASE in the  last 168 hours. No results for input(s): AMMONIA in the last 168 hours.  CBC: Recent Labs  Lab 07/31/20 0319 08/02/20 0601 08/05/20 0352  WBC 13.0* 11.8* 14.4*  HGB 9.0* 9.3* 9.4*  HCT 28.8* 32.1* 32.0*  MCV 89.4 93.6 94.1  PLT 219 225 258    Cardiac Enzymes: No results for input(s): CKTOTAL, CKMB, CKMBINDEX, TROPONINI in the last 168 hours.  BNP (last 3 results) No results for input(s): BNP in the last 8760 hours.  ProBNP (last 3 results) No results for input(s): PROBNP in the last 8760 hours.  Radiological Exams: No results found.  Assessment/Plan Active Problems:   Acute on chronic respiratory failure with hypoxia (HCC)   Healthcare-associated pneumonia   Cardiac arrest (HCC)   Chronic atrial fibrillation ([place])   1. Acute on chronic respiratory failure hypoxia we will continue with T-piece.  Titrate oxygen continue pulmonary toilet. 2. Healthcare associated pneumonia treated slow improvement 3. Cardiac arrest rhythm stable 4. Chronic atrial fibrillation rate is controlled at this time   I have personally seen and evaluated the patient, evaluated laboratory and imaging results, formulated the assessment and plan and placed orders. The Patient requires high complexity decision making with multiple systems involvement.  Rounds were  done with the Respiratory Therapy Director and Staff therapists and discussed with nursing staff also.  [name], MD [facility] Pulmonary Critical Care Medicine Sleep Medicine

## 2020-08-07 ENCOUNTER — Other Ambulatory Visit (HOSPITAL_COMMUNITY): Payer: Self-pay

## 2020-08-07 DIAGNOSIS — D3500 Benign neoplasm of unspecified adrenal gland: Secondary | ICD-10-CM | POA: Diagnosis not present

## 2020-08-07 DIAGNOSIS — K3189 Other diseases of stomach and duodenum: Secondary | ICD-10-CM | POA: Diagnosis not present

## 2020-08-07 DIAGNOSIS — N179 Acute kidney failure, unspecified: Secondary | ICD-10-CM | POA: Diagnosis not present

## 2020-08-07 DIAGNOSIS — N1330 Unspecified hydronephrosis: Secondary | ICD-10-CM | POA: Diagnosis not present

## 2020-08-07 DIAGNOSIS — K6389 Other specified diseases of intestine: Secondary | ICD-10-CM | POA: Diagnosis not present

## 2020-08-07 DIAGNOSIS — I4891 Unspecified atrial fibrillation: Secondary | ICD-10-CM | POA: Diagnosis not present

## 2020-08-07 DIAGNOSIS — J9690 Respiratory failure, unspecified, unspecified whether with hypoxia or hypercapnia: Secondary | ICD-10-CM | POA: Diagnosis not present

## 2020-08-07 DIAGNOSIS — E876 Hypokalemia: Secondary | ICD-10-CM | POA: Diagnosis not present

## 2020-08-07 LAB — BASIC METABOLIC PANEL
Anion gap: 9 (ref 5–15)
BUN: 63 mg/dL — ABNORMAL HIGH (ref 8–23)
CO2: 28 mmol/L (ref 22–32)
Calcium: 8.5 mg/dL — ABNORMAL LOW (ref 8.9–10.3)
Chloride: 106 mmol/L (ref 98–111)
Creatinine, Ser: 1.16 mg/dL (ref 0.61–1.24)
GFR, Estimated: >60 mL/min (ref >60)
Glucose, Bld: 113 mg/dL — ABNORMAL HIGH (ref 70–99)
Potassium: 3.4 mmol/L — ABNORMAL LOW (ref 3.5–5.1)
Sodium: 143 mmol/L (ref 135–145)

## 2020-08-07 LAB — POTASSIUM: Potassium: 3.2 mmol/L — ABNORMAL LOW (ref 3.5–5.1)

## 2020-08-07 IMAGING — CT CT ABD-PELV W/O CM
2 of 4 series · 16 of 46 positions shown, 18 images · non-contrast
Comparison: Abdominal radiograph [DATE] and renal
ultrasound [DATE]

CLINICAL DATA: Abdominal distension

EXAM:
CT ABDOMEN AND PELVIS WITHOUT CONTRAST
TECHNIQUE: Multidetector CT imaging of the abdomen and pelvis was performed
following the standard protocol without IV contrast.

[Series 3: ap without · axial · non-contrast · 0.98mm/px · z∈[-436,-11]mm · 13 of 96 slices shown, 15 images]
[im 6/96  soft-tissue]
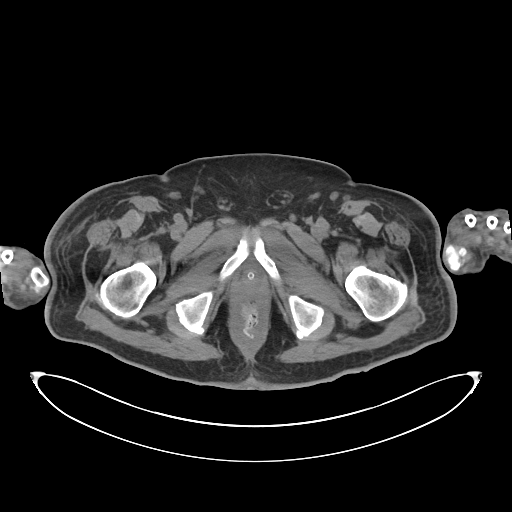
[im 6/96  bone]
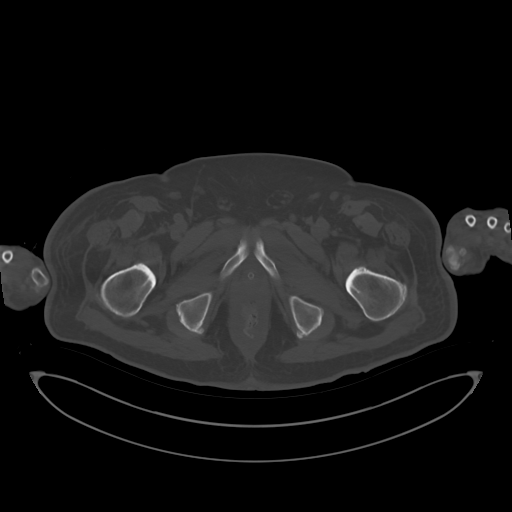
[im 16/96  soft-tissue]
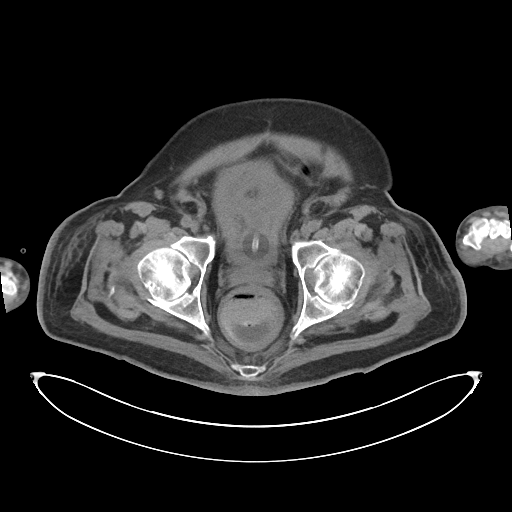
[im 21/96  soft-tissue]
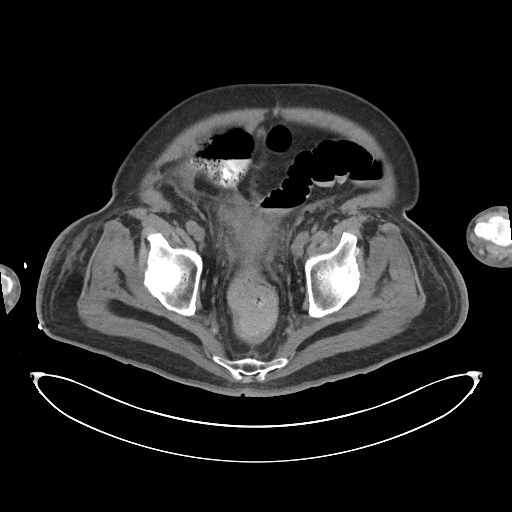
[im 26/96  soft-tissue]
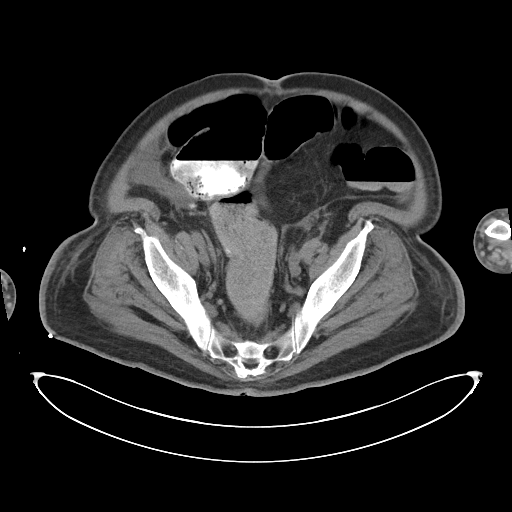
[im 36/96  soft-tissue]
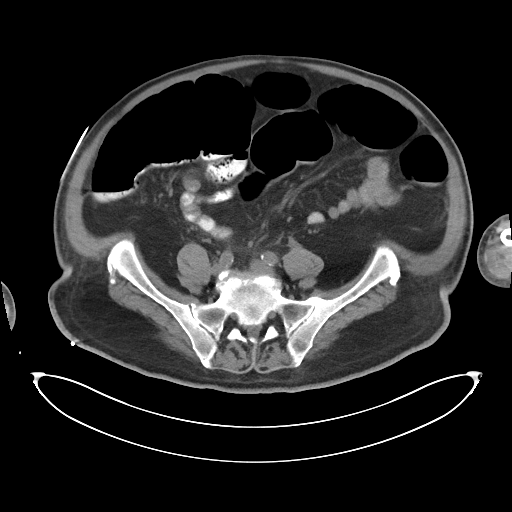
[im 41/96  soft-tissue]
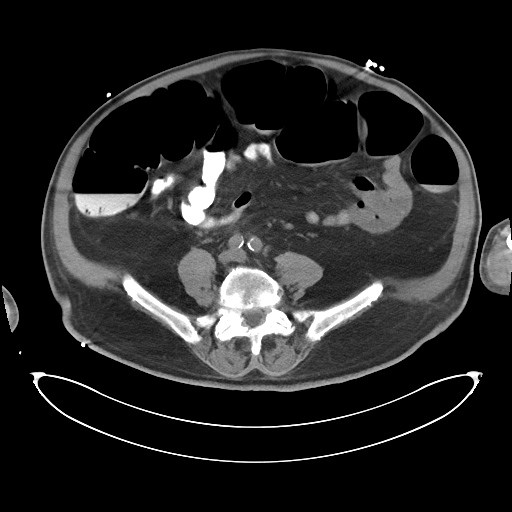
[im 51/96  soft-tissue]
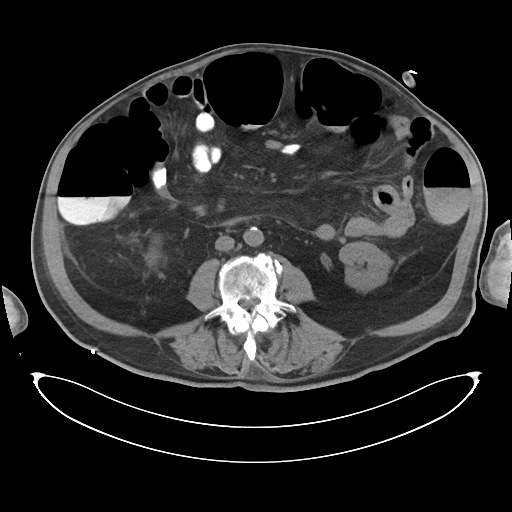
[im 56/96  soft-tissue]
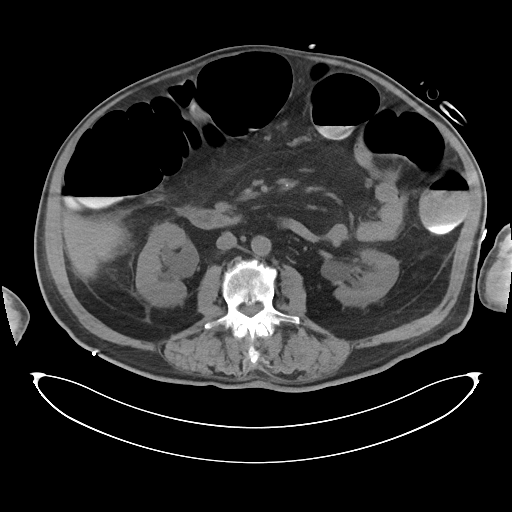
[im 61/96  soft-tissue]
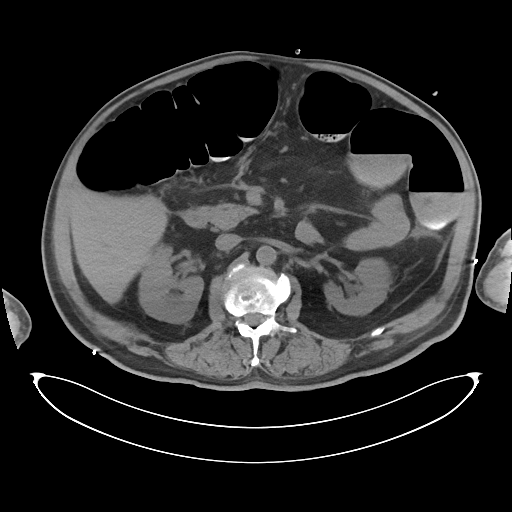
[im 61/96  bone]
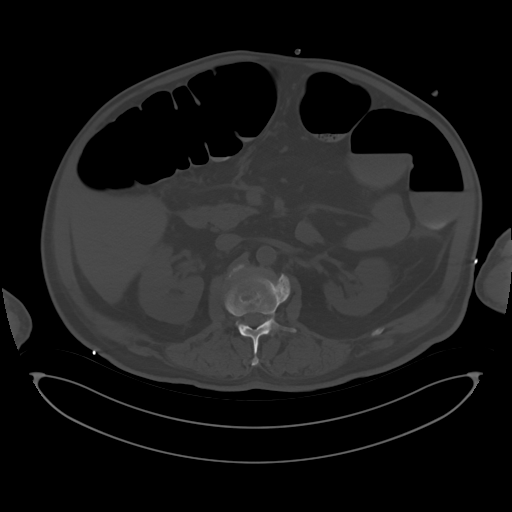
[im 71/96  soft-tissue]
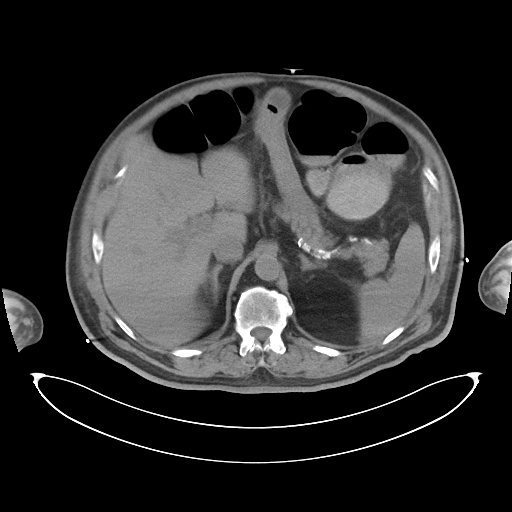
[im 76/96  soft-tissue]
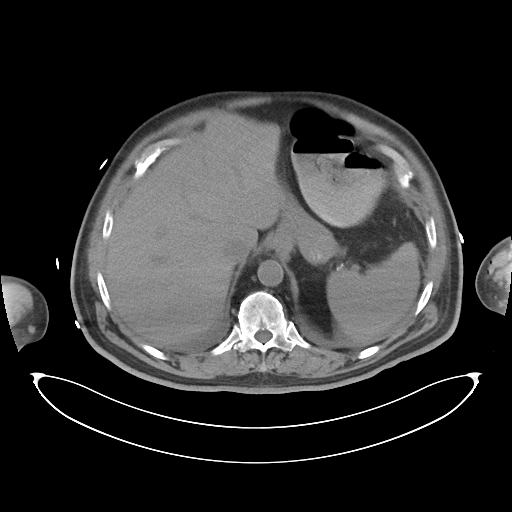
[im 81/96  soft-tissue]
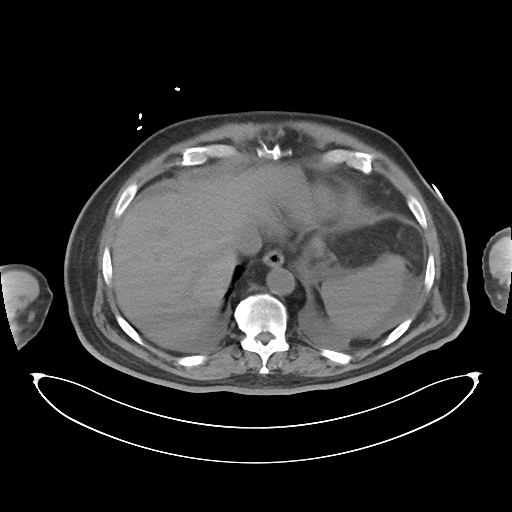
[im 91/96  soft-tissue]
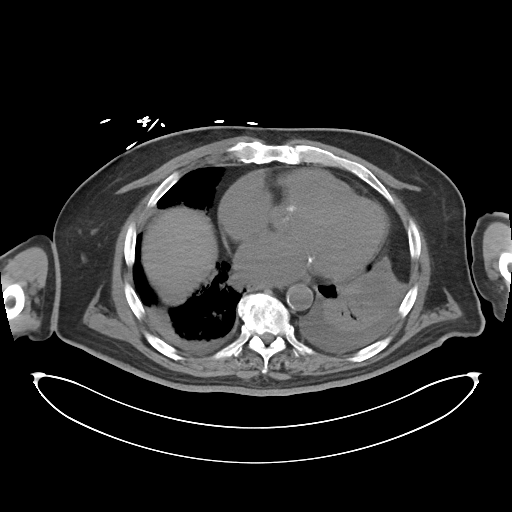

[Series 6: cor · coronal · 0.96mm/px · 3 of 119 slices shown]
[im 40/119  soft-tissue]
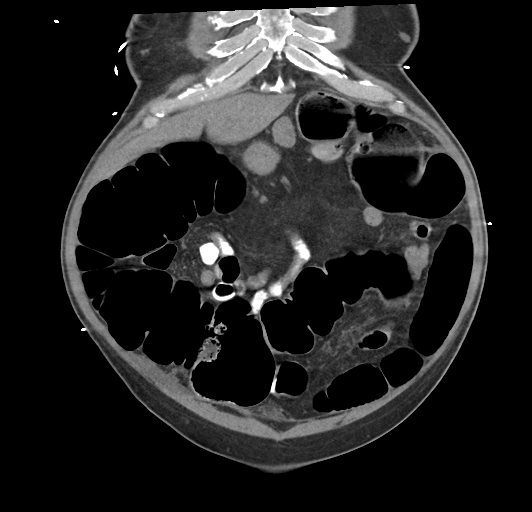
[im 53/119  soft-tissue]
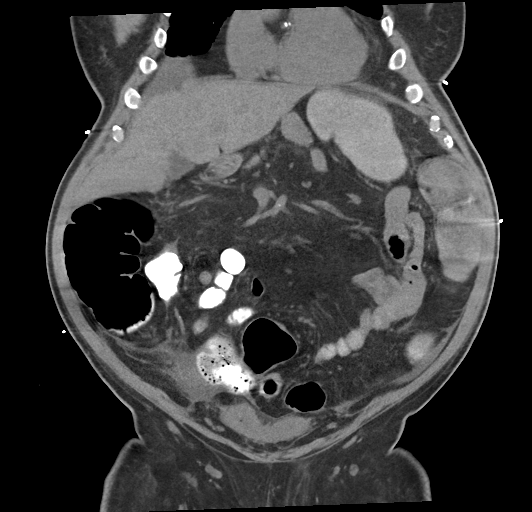
[im 66/119  soft-tissue]
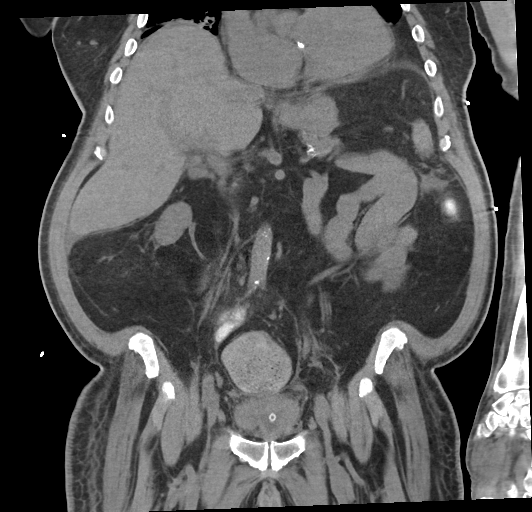

[16 of 46 positions shown; findings below may reference images not displayed]

FINDINGS: Lower chest: Small left greater than right pleural effusions with
left greater than right basilar consolidations. Basilar ground-glass
opacities with interlobular septal thickening. Cardiac enlargement.
Mitral annulus calcifications. Coronary artery calcifications.

Hepatobiliary: Unremarkable noncontrast appearance of the hepatic
parenchyma. Gallbladder is unremarkable. No biliary ductal dilation.

Pancreas: Unremarkable.

Spleen: Unremarkable

Adrenals/Urinary Tract: Right adrenal gland is unremarkable. 1.5 cm
left adrenal adenoma.

Improved now mild right greater than left hydronephrosis. No
nephrolithiasis.

Urinary bladder is decompressed around a Foley catheter with a few
punctate foci of gas in lumen of the bladder

Stomach/Bowel: Radiopaque enteric contrast is visualized to the
level of the descending colon. Percutaneous gastrostomy tube with
retention balloon in the stomach. Stomach is predominantly
decompressed. No small bowel dilation. The appendix is unremarkable.
There is diffuse gaseous distension of the: To the level of the
rectum measuring up to 9 cm at the cecum. Rectal tube in place.

Vascular/Lymphatic: Aortic atherosclerosis. No enlarged abdominal or
pelvic lymph nodes.

Reproductive: Prostate is unremarkable.

Other: Mild body wall edema.  Small volume abdominal ascites.

Musculoskeletal: Multilevel degenerative changes spine. No acute
osseous abnormality.
IMPRESSION: 1. Diffuse gaseous distension of the colon to the level of the
rectum measuring up to 9 cm at the cecum. No small bowel dilation or
evidence of mechanical obstruction.
2. Improved now mild right greater than left hydronephrosis. No
nephrolithiasis.
3. Small left greater than right pleural effusions with left greater
than right basilar consolidations. Basilar ground-glass opacities
with interlobular septal thickening. Findings may reflect pulmonary
edema.
4. Small volume abdominal ascites.
5. 1.5 cm left adrenal adenoma.
6. Aortic atherosclerosis.

Aortic Atherosclerosis ([Z9]-[Z9]).

## 2020-08-07 NOTE — Progress Notes (Signed)
Pulmonary [name]  PROGRESS NOTE     Daniel [name]  CXK:[id number]  DOB: [birth year]   DOA: 07/08/2020  Referring Physician: Merton Border, MD  HPI: Daniel [name] is a 80 y.o. male seen for follow up of Acute on Chronic Respiratory Failure.  Patient is on T collar has been on 40% FiO2 had CT of the GI done yesterday showing diffuse ileus  Medications: Reviewed on Rounds  Physical Exam:  Vitals: Temperature is 96.5 pulse 95 respiratory rate 18 blood pressure is 123/63 saturations 97%  Ventilator Settings off the vent on T collar currently on 40% FiO2  . General: Comfortable at this time . Eyes: Grossly normal lids, irises & conjunctiva . ENT: grossly tongue is normal . Neck: no obvious mass . Cardiovascular: S1 S2 normal no gallop . Respiratory: No rhonchi no rales are noted at this time . Abdomen: soft . Skin: no rash seen on limited exam . Musculoskeletal: not rigid . Psychiatric:unable to assess . Neurologic: no seizure no involuntary movements         Lab Data:   Basic Metabolic Panel: Recent Labs  Lab 08/03/20 0449 08/04/20 0422 08/05/20 0352 08/06/20 0450 08/06/20 1938 08/07/20 0250 08/07/20 0958  NA 155* 152* 151* 148*  --  143  --   K 3.1* 3.0* 3.4* 2.7* 2.5* 3.4* 3.2*  CL 107 106 108 104  --  106  --   CO2 39* 37* 35* 35*  --  28  --   GLUCOSE 122* 129* 150* 167*  --  113*  --   BUN 65* 67* 66* 66*  --  63*  --   CREATININE 1.58* 1.60* 1.51* 1.36*  --  1.16  --   CALCIUM 9.4 9.4 9.1 9.0  --  8.5*  --     ABG: Recent Labs  Lab 08/01/20 0443 08/04/20 0415  PHART 7.393 7.462*  PCO2ART 65.0* 56.8*  PO2ART 95.3 66.1*  HCO3 38.8* 39.7*  O2SAT 97.8 92.8    Liver Function Tests: No results for input(s): AST, ALT, ALKPHOS, BILITOT, PROT, ALBUMIN in the last 168 hours. No results for input(s): LIPASE, AMYLASE in the last 168 hours. No results for input(s): AMMONIA  in the last 168 hours.  CBC: Recent Labs  Lab 08/02/20 0601 08/05/20 0352  WBC 11.8* 14.4*  HGB 9.3* 9.4*  HCT 32.1* 32.0*  MCV 93.6 94.1  PLT 225 258    Cardiac Enzymes: No results for input(s): CKTOTAL, CKMB, CKMBINDEX, TROPONINI in the last 168 hours.  BNP (last 3 results) No results for input(s): BNP in the last 8760 hours.  ProBNP (last 3 results) No results for input(s): PROBNP in the last 8760 hours.  Radiological Exams: CT ABDOMEN PELVIS WO CONTRAST  Result Date: 08/07/2020 CLINICAL DATA:  Abdominal distension EXAM: CT ABDOMEN AND PELVIS WITHOUT CONTRAST TECHNIQUE: Multidetector CT imaging of the abdomen and pelvis was performed following the standard protocol without IV contrast. COMPARISON:  Abdominal radiograph July 31, 2020 and renal ultrasound July 29, 2020 FINDINGS: Lower chest: Small left greater than right pleural effusions with left greater than right basilar consolidations. Basilar ground-glass opacities with interlobular septal thickening. Cardiac enlargement. Mitral annulus calcifications. Coronary artery calcifications. Hepatobiliary: Unremarkable noncontrast appearance of the hepatic parenchyma. Gallbladder is unremarkable. No biliary ductal dilation. Pancreas: Unremarkable. Spleen: Unremarkable Adrenals/Urinary Tract: Right adrenal gland is unremarkable. 1.5 cm left adrenal adenoma. Improved now mild right greater than left hydronephrosis. No  nephrolithiasis. Urinary bladder is decompressed around a Foley catheter with a few punctate foci of gas in lumen of the bladder Stomach/Bowel: Radiopaque enteric contrast is visualized to the level of the descending colon. Percutaneous gastrostomy tube with retention balloon in the stomach. Stomach is predominantly decompressed. No small bowel dilation. The appendix is unremarkable. There is diffuse gaseous distension of the: To the level of the rectum measuring up to 9 cm at the cecum. Rectal tube in place.  Vascular/Lymphatic: Aortic atherosclerosis. No enlarged abdominal or pelvic lymph nodes. Reproductive: Prostate is unremarkable. Other: Mild body wall edema.  Small volume abdominal ascites. Musculoskeletal: Multilevel degenerative changes spine. No acute osseous abnormality. IMPRESSION: 1. Diffuse gaseous distension of the colon to the level of the rectum measuring up to 9 cm at the cecum. No small bowel dilation or evidence of mechanical obstruction. 2. Improved now mild right greater than left hydronephrosis. No nephrolithiasis. 3. Small left greater than right pleural effusions with left greater than right basilar consolidations. Basilar ground-glass opacities with interlobular septal thickening. Findings may reflect pulmonary edema. 4. Small volume abdominal ascites. 5. 1.5 cm left adrenal adenoma. 6. Aortic atherosclerosis. Aortic Atherosclerosis (ICD10-I70.0). Electronically Signed   By: Dahlia Bailiff MD   On: 08/07/2020 02:06    Assessment/Plan Active Problems:   Acute on chronic respiratory failure with hypoxia (HCC)   Healthcare-associated pneumonia   Cardiac arrest ([place])   Chronic atrial fibrillation ([place])   1. Acute on chronic respiratory failure hypoxia so far seems to be tolerating the T collar well the CT scan shows no mechanical obstruction but has diffuse gaseous distention. 2. Healthcare associated pneumonia still with groundglass opacities noted.  We will continue to follow fluid status closely. 3. Cardiac arrest regular rhythm has been stable 4. Chronic atrial fibrillation rate is controlled   I have personally seen and evaluated the patient, evaluated laboratory and imaging results, formulated the assessment and plan and placed orders. The Patient requires high complexity decision making with multiple systems involvement.  Rounds were done with the Respiratory Therapy Director and Staff therapists and discussed with nursing staff also.  [name], MD [facility] Pulmonary  Critical Care Medicine Sleep Medicine

## 2020-08-08 DIAGNOSIS — N179 Acute kidney failure, unspecified: Secondary | ICD-10-CM | POA: Diagnosis not present

## 2020-08-08 DIAGNOSIS — I959 Hypotension, unspecified: Secondary | ICD-10-CM | POA: Diagnosis not present

## 2020-08-08 DIAGNOSIS — J9690 Respiratory failure, unspecified, unspecified whether with hypoxia or hypercapnia: Secondary | ICD-10-CM | POA: Diagnosis not present

## 2020-08-08 DIAGNOSIS — I4891 Unspecified atrial fibrillation: Secondary | ICD-10-CM | POA: Diagnosis not present

## 2020-08-08 LAB — POTASSIUM
Potassium: 3 mmol/L — ABNORMAL LOW (ref 3.5–5.1)
Potassium: 3.6 mmol/L (ref 3.5–5.1)

## 2020-08-08 NOTE — Progress Notes (Signed)
Pulmonary [place]  PROGRESS NOTE     Daniel [name]  [id number]  DOB: 1941/03/29   DOA: 07/08/2020  Referring Physician: Merton Border, MD  HPI: Daniel [name] is a 80 y.o. male seen for follow up of Acute on Chronic Respiratory Failure.  Patient is resting comfortably right now without distress has been on T collar  Medications: Reviewed on Rounds  Physical Exam:  Vitals: Temperature is 97.2 pulse 85 respiratory 20 blood pressure is 100/65 saturations 100%  Ventilator Settings off the ventilator on T collar FiO2 28%  . General: Comfortable at this time . Eyes: Grossly normal lids, irises & conjunctiva . ENT: grossly tongue is normal . Neck: no obvious mass . Cardiovascular: S1 S2 normal no gallop . Respiratory: Scattered rhonchi expansion is equal . Abdomen: soft . Skin: no rash seen on limited exam . Musculoskeletal: not rigid . Psychiatric:unable to assess . Neurologic: no seizure no involuntary movements         Lab Data:   Basic Metabolic Panel: Recent Labs  Lab 08/03/20 0449 08/04/20 0422 08/05/20 0352 08/06/20 0450 08/06/20 1938 08/07/20 0250 08/07/20 0958 08/07/20 2351  NA 155* 152* 151* 148*  --  143  --   --   K 3.1* 3.0* 3.4* 2.7* 2.5* 3.4* 3.2* 3.0*  CL 107 106 108 104  --  106  --   --   CO2 39* 37* 35* 35*  --  28  --   --   GLUCOSE 122* 129* 150* 167*  --  113*  --   --   BUN 65* 67* 66* 66*  --  63*  --   --   CREATININE 1.58* 1.60* 1.51* 1.36*  --  1.16  --   --   CALCIUM 9.4 9.4 9.1 9.0  --  8.5*  --   --     ABG: Recent Labs  Lab 08/04/20 0415  PHART 7.462*  PCO2ART 56.8*  PO2ART 66.1*  HCO3 39.7*  O2SAT 92.8    Liver Function Tests: No results for input(s): AST, ALT, ALKPHOS, BILITOT, PROT, ALBUMIN in the last 168 hours. No results for input(s): LIPASE, AMYLASE in the last 168 hours. No results for input(s): AMMONIA in the last 168  hours.  CBC: Recent Labs  Lab 08/02/20 0601 08/05/20 0352  WBC 11.8* 14.4*  HGB 9.3* 9.4*  HCT 32.1* 32.0*  MCV 93.6 94.1  PLT 225 258    Cardiac Enzymes: No results for input(s): CKTOTAL, CKMB, CKMBINDEX, TROPONINI in the last 168 hours.  BNP (last 3 results) No results for input(s): BNP in the last 8760 hours.  ProBNP (last 3 results) No results for input(s): PROBNP in the last 8760 hours.  Radiological Exams: CT ABDOMEN PELVIS WO CONTRAST  Result Date: 08/07/2020 CLINICAL DATA:  Abdominal distension EXAM: CT ABDOMEN AND PELVIS WITHOUT CONTRAST TECHNIQUE: Multidetector CT imaging of the abdomen and pelvis was performed following the standard protocol without IV contrast. COMPARISON:  Abdominal radiograph July 31, 2020 and renal ultrasound July 29, 2020 FINDINGS: Lower chest: Small left greater than right pleural effusions with left greater than right basilar consolidations. Basilar ground-glass opacities with interlobular septal thickening. Cardiac enlargement. Mitral annulus calcifications. Coronary artery calcifications. Hepatobiliary: Unremarkable noncontrast appearance of the hepatic parenchyma. Gallbladder is unremarkable. No biliary ductal dilation. Pancreas: Unremarkable. Spleen: Unremarkable Adrenals/Urinary Tract: Right adrenal gland is unremarkable. 1.5 cm left adrenal adenoma. Improved now mild right greater than  left hydronephrosis. No nephrolithiasis. Urinary bladder is decompressed around a Foley catheter with a few punctate foci of gas in lumen of the bladder Stomach/Bowel: Radiopaque enteric contrast is visualized to the level of the descending colon. Percutaneous gastrostomy tube with retention balloon in the stomach. Stomach is predominantly decompressed. No small bowel dilation. The appendix is unremarkable. There is diffuse gaseous distension of the: To the level of the rectum measuring up to 9 cm at the cecum. Rectal tube in place. Vascular/Lymphatic: Aortic  atherosclerosis. No enlarged abdominal or pelvic lymph nodes. Reproductive: Prostate is unremarkable. Other: Mild body wall edema.  Small volume abdominal ascites. Musculoskeletal: Multilevel degenerative changes spine. No acute osseous abnormality. IMPRESSION: 1. Diffuse gaseous distension of the colon to the level of the rectum measuring up to 9 cm at the cecum. No small bowel dilation or evidence of mechanical obstruction. 2. Improved now mild right greater than left hydronephrosis. No nephrolithiasis. 3. Small left greater than right pleural effusions with left greater than right basilar consolidations. Basilar ground-glass opacities with interlobular septal thickening. Findings may reflect pulmonary edema. 4. Small volume abdominal ascites. 5. 1.5 cm left adrenal adenoma. 6. Aortic atherosclerosis. Aortic Atherosclerosis (ICD10-I70.0). Electronically Signed   By: Dahlia Bailiff MD   On: 08/07/2020 02:06    Assessment/Plan Active Problems:   Acute on chronic respiratory failure with hypoxia (HCC)   Healthcare-associated pneumonia   Cardiac arrest ([place])   Chronic atrial fibrillation ([place])   1. Acute on chronic respiratory failure hypoxia we will continue with weaning on the T-piece.  Continue secretion management supportive care. 2. Healthcare associated pneumonia treated with antibiotics. 3. Cardiac arrest rhythm stable 4. Chronic atrial fibrillation rate is controlled at this time   I have personally seen and evaluated the patient, evaluated laboratory and imaging results, formulated the assessment and plan and placed orders. The Patient requires high complexity decision making with multiple systems involvement.  Rounds were done with the Respiratory Therapy Director and Staff therapists and discussed with nursing staff also.  [name], MD [facility] Pulmonary Critical Care Medicine Sleep Medicine

## 2020-08-09 DIAGNOSIS — N179 Acute kidney failure, unspecified: Secondary | ICD-10-CM | POA: Diagnosis not present

## 2020-08-09 DIAGNOSIS — I4891 Unspecified atrial fibrillation: Secondary | ICD-10-CM | POA: Diagnosis not present

## 2020-08-09 DIAGNOSIS — J9690 Respiratory failure, unspecified, unspecified whether with hypoxia or hypercapnia: Secondary | ICD-10-CM | POA: Diagnosis not present

## 2020-08-09 DIAGNOSIS — E876 Hypokalemia: Secondary | ICD-10-CM | POA: Diagnosis not present

## 2020-08-09 LAB — BASIC METABOLIC PANEL
Anion gap: 3 — ABNORMAL LOW (ref 5–15)
BUN: 49 mg/dL — ABNORMAL HIGH (ref 8–23)
CO2: 34 mmol/L — ABNORMAL HIGH (ref 22–32)
Calcium: 9 mg/dL (ref 8.9–10.3)
Chloride: 110 mmol/L (ref 98–111)
Creatinine, Ser: 0.99 mg/dL (ref 0.61–1.24)
GFR, Estimated: >60 mL/min (ref >60)
Glucose, Bld: 145 mg/dL — ABNORMAL HIGH (ref 70–99)
Potassium: 3.4 mmol/L — ABNORMAL LOW (ref 3.5–5.1)
Sodium: 147 mmol/L — ABNORMAL HIGH (ref 135–145)

## 2020-08-09 LAB — CULTURE, BLOOD (ROUTINE X 2)
Culture: NO GROWTH
Culture: NO GROWTH
Special Requests: ADEQUATE
Special Requests: ADEQUATE

## 2020-08-09 NOTE — Progress Notes (Signed)
Pulmonary [place]  PROGRESS NOTE     Daniel [name]  XBL:[id number]  DOB: [birth year]   DOA: 07/08/2020  Referring Physician: Merton Border, MD  HPI: Daniel [name] is a 80 y.o. male seen for follow up of Acute on Chronic Respiratory Failure.  Patient right now is on T collar on 28% FiO2 good saturations are noted at this time.  Medications: Reviewed on Rounds  Physical Exam:  Vitals: Temperature is 99.8 pulse 78 respiratory 20 blood pressure is 119/70 saturations 95%  Ventilator Settings currently on T collar FiO2 28%  . General: Comfortable at this time . Eyes: Grossly normal lids, irises & conjunctiva . ENT: grossly tongue is normal . Neck: no obvious mass . Cardiovascular: S1 S2 normal no gallop . Respiratory: Scattered rhonchi expansion is equal . Abdomen: soft . Skin: no rash seen on limited exam . Musculoskeletal: not rigid . Psychiatric:unable to assess . Neurologic: no seizure no involuntary movements         Lab Data:   Basic Metabolic Panel: Recent Labs  Lab 08/04/20 0422 08/05/20 0352 08/06/20 0450 08/06/20 1938 08/07/20 0250 08/07/20 0958 08/07/20 2351 08/08/20 1001 08/09/20 0458  NA 152* 151* 148*  --  143  --   --   --  147*  K 3.0* 3.4* 2.7*   < > 3.4* 3.2* 3.0* 3.6 3.4*  CL 106 108 104  --  106  --   --   --  110  CO2 37* 35* 35*  --  28  --   --   --  34*  GLUCOSE 129* 150* 167*  --  113*  --   --   --  145*  BUN 67* 66* 66*  --  63*  --   --   --  49*  CREATININE 1.60* 1.51* 1.36*  --  1.16  --   --   --  0.99  CALCIUM 9.4 9.1 9.0  --  8.5*  --   --   --  9.0   < > = values in this interval not displayed.    ABG: Recent Labs  Lab 08/04/20 0415  PHART 7.462*  PCO2ART 56.8*  PO2ART 66.1*  HCO3 39.7*  O2SAT 92.8    Liver Function Tests: No results for input(s): AST, ALT, ALKPHOS, BILITOT, PROT, ALBUMIN in the last 168 hours. No results for  input(s): LIPASE, AMYLASE in the last 168 hours. No results for input(s): AMMONIA in the last 168 hours.  CBC: Recent Labs  Lab 08/05/20 0352  WBC 14.4*  HGB 9.4*  HCT 32.0*  MCV 94.1  PLT 258    Cardiac Enzymes: No results for input(s): CKTOTAL, CKMB, CKMBINDEX, TROPONINI in the last 168 hours.  BNP (last 3 results) No results for input(s): BNP in the last 8760 hours.  ProBNP (last 3 results) No results for input(s): PROBNP in the last 8760 hours.  Radiological Exams: No results found.  Assessment/Plan Active Problems:   Acute on chronic respiratory failure with hypoxia (HCC)   Healthcare-associated pneumonia   Cardiac arrest (HCC)   Chronic atrial fibrillation ([place])   1. Acute on chronic respiratory failure hypoxia plan is going to be to continue with T collar weaning as tolerated we will continue secretion management supportive care. 2. Healthcare associated pneumonia treated slow improvement 3. Cardiac arrest rhythm has been stable. 4. Chronic atrial fibrillation rate is controlled we will continue to follow  I have personally seen and evaluated the patient, evaluated laboratory and imaging results, formulated the assessment and plan and placed orders. The Patient requires high complexity decision making with multiple systems involvement.  Rounds were done with the Respiratory Therapy Director and Staff therapists and discussed with nursing staff also.  [name], MD [facility] Pulmonary Critical Care Medicine Sleep Medicine

## 2020-08-10 DIAGNOSIS — I4891 Unspecified atrial fibrillation: Secondary | ICD-10-CM | POA: Diagnosis not present

## 2020-08-10 DIAGNOSIS — J9690 Respiratory failure, unspecified, unspecified whether with hypoxia or hypercapnia: Secondary | ICD-10-CM | POA: Diagnosis not present

## 2020-08-10 DIAGNOSIS — S02651A Fracture of angle of right mandible, initial encounter for closed fracture: Secondary | ICD-10-CM | POA: Diagnosis not present

## 2020-08-10 DIAGNOSIS — N179 Acute kidney failure, unspecified: Secondary | ICD-10-CM | POA: Diagnosis not present

## 2020-08-10 LAB — POTASSIUM: Potassium: 3.8 mmol/L (ref 3.5–5.1)

## 2020-08-10 NOTE — Progress Notes (Signed)
Pulmonary [place]  PROGRESS NOTE     Daniel [name]  [id number]  DOB: 12/22/1940   DOA: 07/08/2020  Referring Physician: Merton Border, MD  HPI: Daniel [name] is a 80 y.o. male seen for follow up of Acute on Chronic Respiratory Failure.  Patient right now is on T collar 28% FiO2 has a #6 trach in place  Medications: Reviewed on Rounds  Physical Exam:  Vitals: Temperature is 97.9 pulse 93 respiratory 25 blood pressure is 134/87 saturations 97%  Ventilator Settings on T collar FiO2 of 28%  . General: Comfortable at this time . Eyes: Grossly normal lids, irises & conjunctiva . ENT: grossly tongue is normal . Neck: no obvious mass . Cardiovascular: S1 S2 normal no gallop . Respiratory: Scattered rhonchi expansion is equal . Abdomen: soft . Skin: no rash seen on limited exam . Musculoskeletal: not rigid . Psychiatric:unable to assess . Neurologic: no seizure no involuntary movements         Lab Data:   Basic Metabolic Panel: Recent Labs  Lab 08/04/20 0422 08/05/20 0352 08/06/20 0450 08/06/20 1938 08/07/20 0250 08/07/20 0958 08/07/20 2351 08/08/20 1001 08/09/20 0458 08/10/20 0623  NA 152* 151* 148*  --  143  --   --   --  147*  --   K 3.0* 3.4* 2.7*   < > 3.4* 3.2* 3.0* 3.6 3.4* 3.8  CL 106 108 104  --  106  --   --   --  110  --   CO2 37* 35* 35*  --  28  --   --   --  34*  --   GLUCOSE 129* 150* 167*  --  113*  --   --   --  145*  --   BUN 67* 66* 66*  --  63*  --   --   --  49*  --   CREATININE 1.60* 1.51* 1.36*  --  1.16  --   --   --  0.99  --   CALCIUM 9.4 9.1 9.0  --  8.5*  --   --   --  9.0  --    < > = values in this interval not displayed.    ABG: Recent Labs  Lab 08/04/20 0415  PHART 7.462*  PCO2ART 56.8*  PO2ART 66.1*  HCO3 39.7*  O2SAT 92.8    Liver Function Tests: No results for input(s): AST, ALT, ALKPHOS, BILITOT, PROT, ALBUMIN in the last 168  hours. No results for input(s): LIPASE, AMYLASE in the last 168 hours. No results for input(s): AMMONIA in the last 168 hours.  CBC: Recent Labs  Lab 08/05/20 0352  WBC 14.4*  HGB 9.4*  HCT 32.0*  MCV 94.1  PLT 258    Cardiac Enzymes: No results for input(s): CKTOTAL, CKMB, CKMBINDEX, TROPONINI in the last 168 hours.  BNP (last 3 results) No results for input(s): BNP in the last 8760 hours.  ProBNP (last 3 results) No results for input(s): PROBNP in the last 8760 hours.  Radiological Exams: No results found.  Assessment/Plan Active Problems:   Acute on chronic respiratory failure with hypoxia (HCC)   Healthcare-associated pneumonia   Cardiac arrest (HCC)   Chronic atrial fibrillation ([place])   1. Acute on chronic respiratory failure with hypoxia patient is currently on T collar FiO2 28% continue to titrate as tolerated. 2. Healthcare associated pneumonia treated slow improvement 3. Cardiac arrest rhythm  has been stable 4. Chronic atrial fibrillation rate is controlled at this time   I have personally seen and evaluated the patient, evaluated laboratory and imaging results, formulated the assessment and plan and placed orders. The Patient requires high complexity decision making with multiple systems involvement.  Rounds were done with the Respiratory Therapy Director and Staff therapists and discussed with nursing staff also.  [name], MD [facility] Pulmonary Critical Care Medicine Sleep Medicine

## 2020-08-11 DIAGNOSIS — I4891 Unspecified atrial fibrillation: Secondary | ICD-10-CM | POA: Diagnosis not present

## 2020-08-11 DIAGNOSIS — E876 Hypokalemia: Secondary | ICD-10-CM | POA: Diagnosis not present

## 2020-08-11 DIAGNOSIS — J9690 Respiratory failure, unspecified, unspecified whether with hypoxia or hypercapnia: Secondary | ICD-10-CM | POA: Diagnosis not present

## 2020-08-11 DIAGNOSIS — N179 Acute kidney failure, unspecified: Secondary | ICD-10-CM | POA: Diagnosis not present

## 2020-08-11 LAB — BASIC METABOLIC PANEL
Anion gap: 6 (ref 5–15)
BUN: 42 mg/dL — ABNORMAL HIGH (ref 8–23)
CO2: 32 mmol/L (ref 22–32)
Calcium: 8.7 mg/dL — ABNORMAL LOW (ref 8.9–10.3)
Chloride: 106 mmol/L (ref 98–111)
Creatinine, Ser: 0.84 mg/dL (ref 0.61–1.24)
GFR, Estimated: >60 mL/min (ref >60)
Glucose, Bld: 140 mg/dL — ABNORMAL HIGH (ref 70–99)
Potassium: 4.1 mmol/L (ref 3.5–5.1)
Sodium: 144 mmol/L (ref 135–145)

## 2020-08-11 NOTE — Progress Notes (Signed)
Pulmonary [place]  PROGRESS NOTE     JA OHMAN  HFW:[id number]  DOB: 03/23/1941   DOA: 07/08/2020  Referring Physician: Merton Border, MD  HPI: JAMELL LAYMON is a 80 y.o. male seen for follow up of Acute on Chronic Respiratory Failure.  Currently patient is on T collar on 28% FiO2 using the PMV  Medications: Reviewed on Rounds  Physical Exam:  Vitals: Temperature 96.2 pulse 100 respiratory 22 blood pressure 141/90 saturations 96%  Ventilator Settings off the ventilator on T collar  . General: Comfortable at this time . Eyes: Grossly normal lids, irises & conjunctiva . ENT: grossly tongue is normal . Neck: no obvious mass . Cardiovascular: S1 S2 normal no gallop . Respiratory: No rhonchi no rales are noted at this time . Abdomen: soft . Skin: no rash seen on limited exam . Musculoskeletal: not rigid . Psychiatric:unable to assess . Neurologic: no seizure no involuntary movements         Lab Data:   Basic Metabolic Panel: Recent Labs  Lab 08/05/20 0352 08/06/20 0450 08/06/20 1938 08/07/20 0250 08/07/20 0958 08/07/20 2351 08/08/20 1001 08/09/20 0458 08/10/20 0623 08/11/20 0846  NA 151* 148*  --  143  --   --   --  147*  --  144  K 3.4* 2.7*   < > 3.4*   < > 3.0* 3.6 3.4* 3.8 4.1  CL 108 104  --  106  --   --   --  110  --  106  CO2 35* 35*  --  28  --   --   --  34*  --  32  GLUCOSE 150* 167*  --  113*  --   --   --  145*  --  140*  BUN 66* 66*  --  63*  --   --   --  49*  --  42*  CREATININE 1.51* 1.36*  --  1.16  --   --   --  0.99  --  0.84  CALCIUM 9.1 9.0  --  8.5*  --   --   --  9.0  --  8.7*   < > = values in this interval not displayed.    ABG: No results for input(s): PHART, PCO2ART, PO2ART, HCO3, O2SAT in the last 168 hours.  Liver Function Tests: No results for input(s): AST, ALT, ALKPHOS, BILITOT, PROT, ALBUMIN in the last 168 hours. No results for  input(s): LIPASE, AMYLASE in the last 168 hours. No results for input(s): AMMONIA in the last 168 hours.  CBC: Recent Labs  Lab 08/05/20 0352  WBC 14.4*  HGB 9.4*  HCT 32.0*  MCV 94.1  PLT 258    Cardiac Enzymes: No results for input(s): CKTOTAL, CKMB, CKMBINDEX, TROPONINI in the last 168 hours.  BNP (last 3 results) No results for input(s): BNP in the last 8760 hours.  ProBNP (last 3 results) No results for input(s): PROBNP in the last 8760 hours.  Radiological Exams: No results found.  Assessment/Plan Active Problems:   Acute on chronic respiratory failure with hypoxia (HCC)   Healthcare-associated pneumonia   Cardiac arrest (HCC)   Chronic atrial fibrillation ([place])   1. Acute on chronic respiratory failure with hypoxia plan is going to be to continue with the weaning on T collar.  Also today will have the tracheostomy tube changed out to a cuffless trach. 2. Cardiac arrest rhythm  is stable at this time we will continue to follow along 3. Chronic atrial fibrillation rate is controlled 4. Healthcare associated pneumonia treated we will continue with supportive care   I have personally seen and evaluated the patient, evaluated laboratory and imaging results, formulated the assessment and plan and placed orders. The Patient requires high complexity decision making with multiple systems involvement.  Rounds were done with the Respiratory Therapy Director and Staff therapists and discussed with nursing staff also.  [name], MD [facility] Pulmonary Critical Care Medicine Sleep Medicine

## 2020-08-12 DIAGNOSIS — J9621 Acute and chronic respiratory failure with hypoxia: Secondary | ICD-10-CM | POA: Diagnosis not present

## 2020-08-12 DIAGNOSIS — E46 Unspecified protein-calorie malnutrition: Secondary | ICD-10-CM | POA: Diagnosis not present

## 2020-08-12 DIAGNOSIS — N179 Acute kidney failure, unspecified: Secondary | ICD-10-CM | POA: Diagnosis not present

## 2020-08-12 DIAGNOSIS — S02651A Fracture of angle of right mandible, initial encounter for closed fracture: Secondary | ICD-10-CM | POA: Diagnosis not present

## 2020-08-12 DIAGNOSIS — I4891 Unspecified atrial fibrillation: Secondary | ICD-10-CM | POA: Diagnosis not present

## 2020-08-12 DIAGNOSIS — D62 Acute posthemorrhagic anemia: Secondary | ICD-10-CM | POA: Diagnosis not present

## 2020-08-12 DIAGNOSIS — I482 Chronic atrial fibrillation, unspecified: Secondary | ICD-10-CM | POA: Diagnosis not present
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
## 2020-08-12 NOTE — Progress Notes (Signed)
Pulmonary [place]  PROGRESS NOTE     DARRIOUS YOUMAN  OEU:[id number]  DOB: March 22, 1941   DOA: 07/08/2020  Referring Physician: Merton Border, MD  HPI: MATTIA LIFORD is a 80 y.o. male seen for follow up of Acute on Chronic Respiratory Failure.  Patient is currently on T-piece on 28% FiO2 with PMV should be started capping  Medications: Reviewed on Rounds  Physical Exam:  Vitals: Temperature is 97.2 pulse 95 respiratory rate is 19 blood pressure is 135/89 saturations 96%  Ventilator Settings on T-piece on 28% FiO2  . General: Comfortable at this time . Eyes: Grossly normal lids, irises & conjunctiva . ENT: grossly tongue is normal . Neck: no obvious mass . Cardiovascular: S1 S2 normal no gallop . Respiratory: Scattered rhonchi expansion is equal . Abdomen: soft . Skin: no rash seen on limited exam . Musculoskeletal: not rigid . Psychiatric:unable to assess . Neurologic: no seizure no involuntary movements         Lab Data:   Basic Metabolic Panel: Recent Labs  Lab 08/06/20 0450 08/06/20 1938 08/07/20 0250 08/07/20 0958 08/07/20 2351 08/08/20 1001 08/09/20 0458 08/10/20 0623 08/11/20 0846  NA 148*  --  143  --   --   --  147*  --  144  K 2.7*   < > 3.4*   < > 3.0* 3.6 3.4* 3.8 4.1  CL 104  --  106  --   --   --  110  --  106  CO2 35*  --  28  --   --   --  34*  --  32  GLUCOSE 167*  --  113*  --   --   --  145*  --  140*  BUN 66*  --  63*  --   --   --  49*  --  42*  CREATININE 1.36*  --  1.16  --   --   --  0.99  --  0.84  CALCIUM 9.0  --  8.5*  --   --   --  9.0  --  8.7*   < > = values in this interval not displayed.    ABG: No results for input(s): PHART, PCO2ART, PO2ART, HCO3, O2SAT in the last 168 hours.  Liver Function Tests: No results for input(s): AST, ALT, ALKPHOS, BILITOT, PROT, ALBUMIN in the last 168 hours. No results for input(s): LIPASE, AMYLASE in the last  168 hours. No results for input(s): AMMONIA in the last 168 hours.  CBC: No results for input(s): WBC, NEUTROABS, HGB, HCT, MCV, PLT in the last 168 hours.  Cardiac Enzymes: No results for input(s): CKTOTAL, CKMB, CKMBINDEX, TROPONINI in the last 168 hours.  BNP (last 3 results) No results for input(s): BNP in the last 8760 hours.  ProBNP (last 3 results) No results for input(s): PROBNP in the last 8760 hours.  Radiological Exams: No results found.  Assessment/Plan Active Problems:   Acute on chronic respiratory failure with hypoxia (HCC)   Healthcare-associated pneumonia   Cardiac arrest (HCC)   Chronic atrial fibrillation ([place])   1. Acute on chronic respiratory failure hypoxia patient is doing fine with T-piece at the trach changed out doing better we will try to start with capping 2. Healthcare associated pneumonia treated slowly improving 3. Cardiac arrest rhythm is stable at this time. 4. Chronic atrial fibrillation rate is controlled   I have personally seen  and evaluated the patient, evaluated laboratory and imaging results, formulated the assessment and plan and placed orders. The Patient requires high complexity decision making with multiple systems involvement.  Rounds were done with the Respiratory Therapy Director and Staff therapists and discussed with nursing staff also.  [name], MD [facility] Pulmonary Critical Care Medicine Sleep Medicine

## 2020-08-13 DIAGNOSIS — S02651A Fracture of angle of right mandible, initial encounter for closed fracture: Secondary | ICD-10-CM | POA: Diagnosis not present

## 2020-08-13 DIAGNOSIS — N179 Acute kidney failure, unspecified: Secondary | ICD-10-CM | POA: Diagnosis not present

## 2020-08-13 DIAGNOSIS — I4891 Unspecified atrial fibrillation: Secondary | ICD-10-CM | POA: Diagnosis not present

## 2020-08-13 DIAGNOSIS — D62 Acute posthemorrhagic anemia: Secondary | ICD-10-CM | POA: Diagnosis not present

## 2020-08-14 ENCOUNTER — Other Ambulatory Visit (HOSPITAL_COMMUNITY): Payer: Self-pay

## 2020-08-14 DIAGNOSIS — J208 Acute bronchitis due to other specified organisms: Secondary | ICD-10-CM | POA: Diagnosis not present

## 2020-08-14 DIAGNOSIS — I4891 Unspecified atrial fibrillation: Secondary | ICD-10-CM | POA: Diagnosis not present

## 2020-08-14 DIAGNOSIS — N179 Acute kidney failure, unspecified: Secondary | ICD-10-CM | POA: Diagnosis not present

## 2020-08-14 DIAGNOSIS — E876 Hypokalemia: Secondary | ICD-10-CM | POA: Diagnosis not present

## 2020-08-14 LAB — BLOOD GAS, ARTERIAL
Acid-Base Excess: 8 mmol/L — ABNORMAL HIGH (ref 0.0–2.0)
Acid-Base Excess: 8.3 mmol/L — ABNORMAL HIGH (ref 0.0–2.0)
Bicarbonate: 32.6 mmol/L — ABNORMAL HIGH (ref 20.0–28.0)
Bicarbonate: 33.4 mmol/L — ABNORMAL HIGH (ref 20.0–28.0)
Drawn by: 164
FIO2: 21
FIO2: 28
O2 Saturation: 80.4 %
O2 Saturation: 93.9 %
Patient temperature: 37
Patient temperature: 36.1
pCO2 arterial: 50.6 mmHg — ABNORMAL HIGH (ref 32.0–48.0)
pCO2 arterial: 54 mmHg — ABNORMAL HIGH (ref 32.0–48.0)
pH, Arterial: 7.425 (ref 7.350–7.450)
pH, Arterial: 7.403 (ref 7.350–7.450)
pO2, Arterial: 45.9 mmHg — ABNORMAL LOW (ref 83.0–108.0)
pO2, Arterial: 67.5 mmHg — ABNORMAL LOW (ref 83.0–108.0)

## 2020-08-14 IMAGING — CT CT HEAD W/O CM
4 series · 17 of 37 positions shown, 19 images · non-contrast
Comparison: Maxillofacial CT [DATE]

CLINICAL DATA: Mental status change.

EXAM:
CT HEAD WITHOUT CONTRAST
TECHNIQUE: Contiguous axial images were obtained from the base of the skull
through the vertex without intravenous contrast.

[Series 3: head without · axial · non-contrast · 0.46mm/px · z∈[-66,+49]mm · 4 of 39 slices shown (1 of 2)]
[im 8/39  brain]
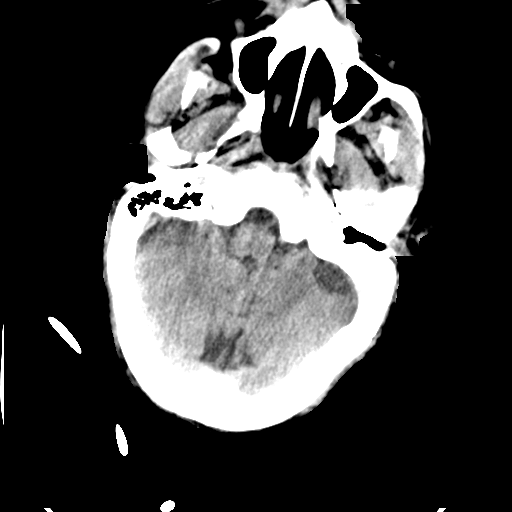
[im 16/39  brain]
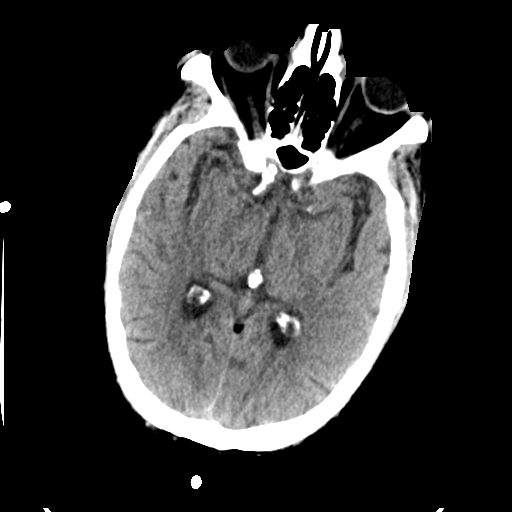
[im 23/39  brain]
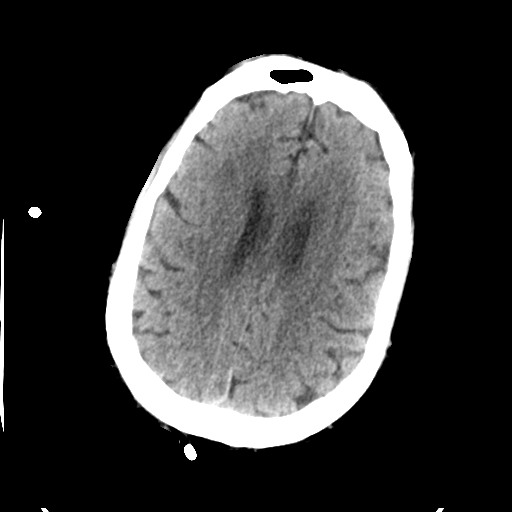
[im 31/39  brain]
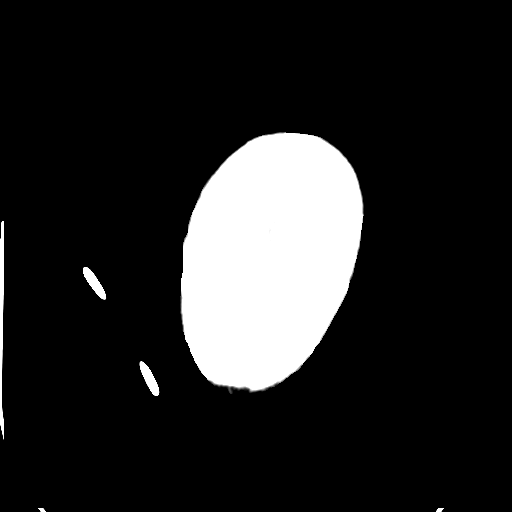

[Series 4: head without · axial · non-contrast · 0.46mm/px · z∈[-71,+54]mm · 5 of 39 slices shown, 7 images (2 of 2)]
[im 7/39  brain]
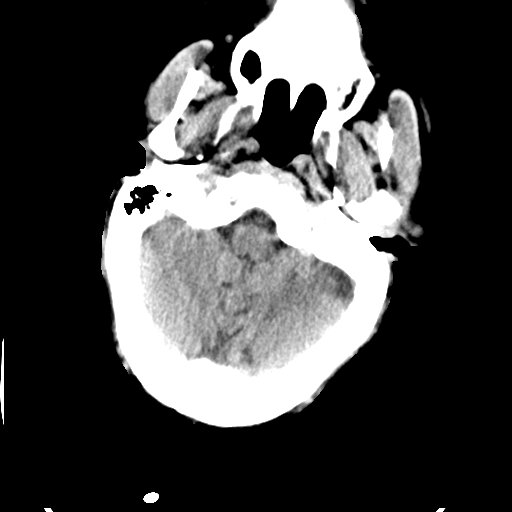
[im 7/39  bone]
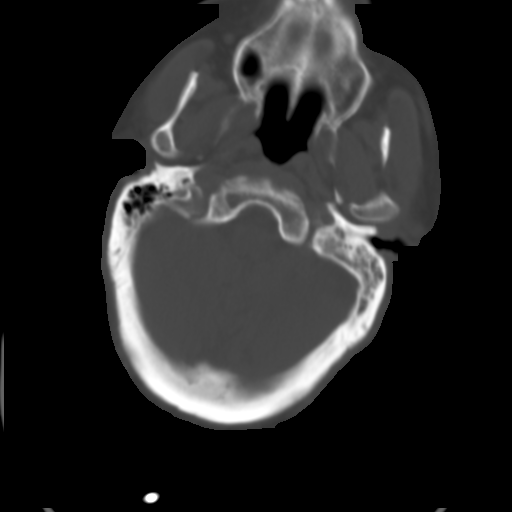
[im 13/39  brain]
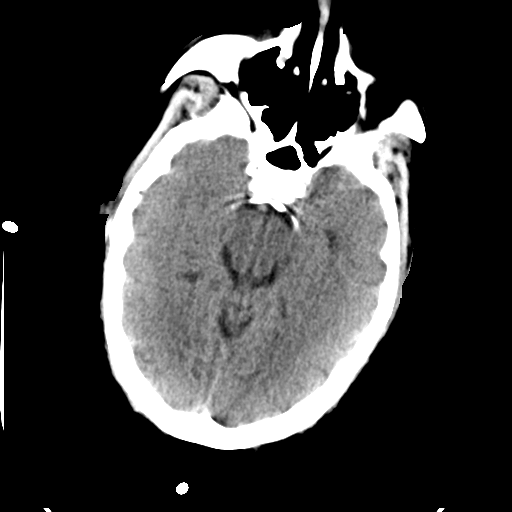
[im 20/39  brain]
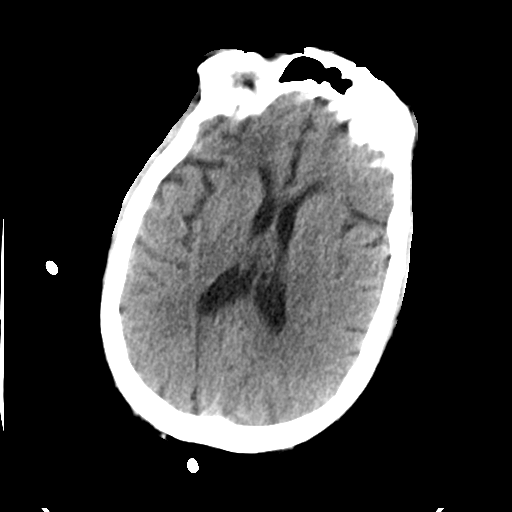
[im 26/39  brain]
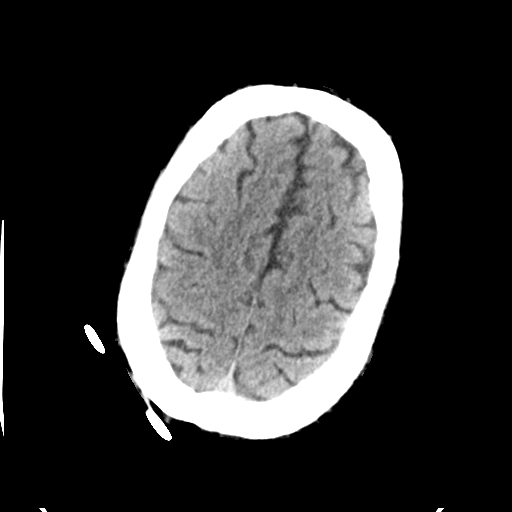
[im 32/39  brain]
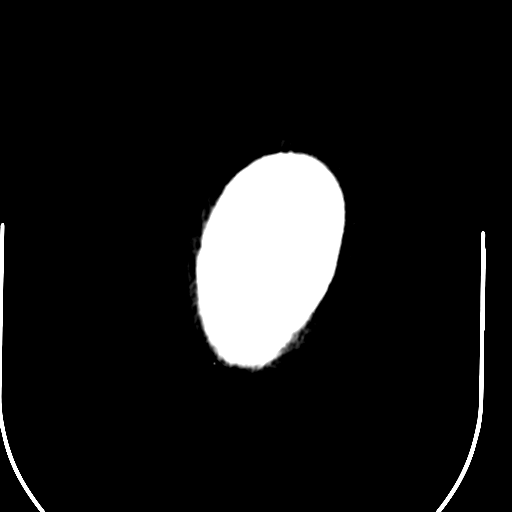
[im 32/39  bone]
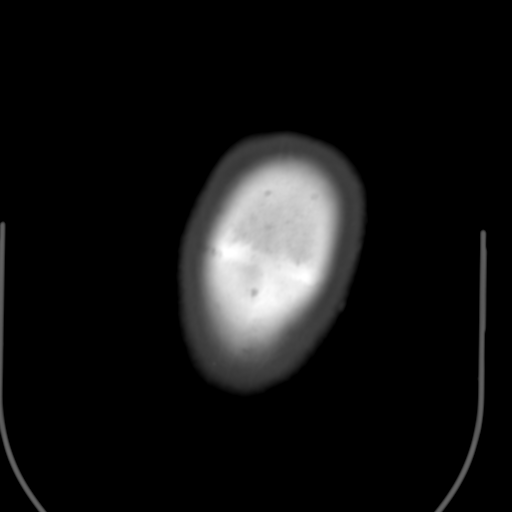

[Series 5: head bone · axial · 0.46mm/px · z∈[-91,+5]mm · 5 of 96 slices shown]
[im 6/96  bone]
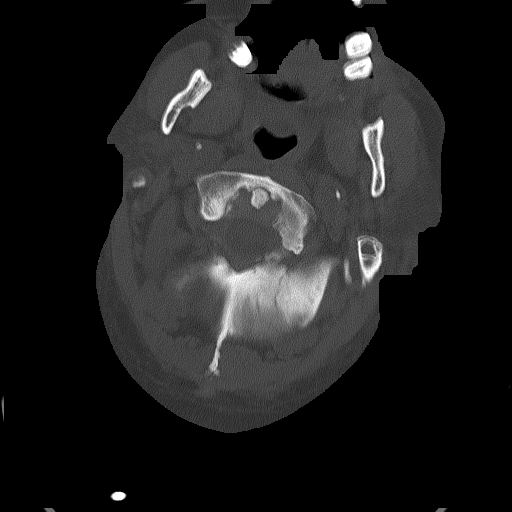
[im 18/96  bone]
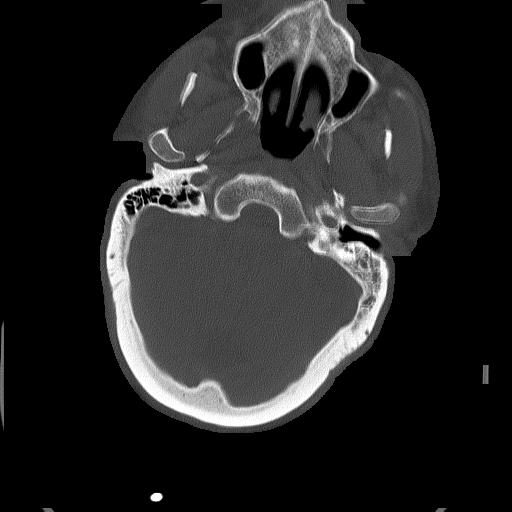
[im 30/96  bone]
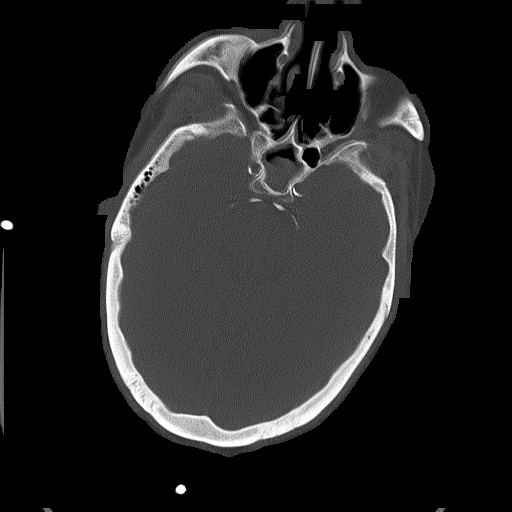
[im 42/96  bone]
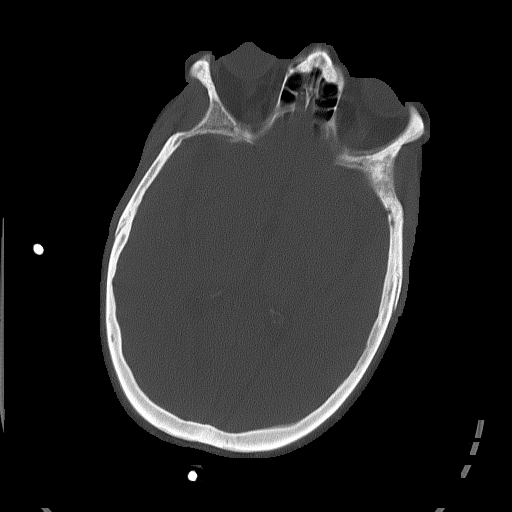
[im 54/96  bone]
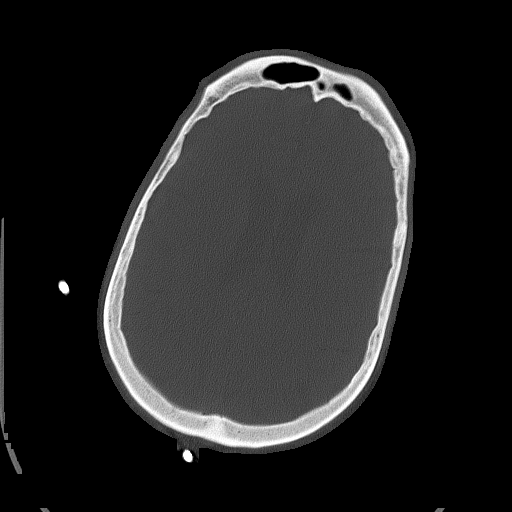

[Series 7: head without sag · sagittal · non-contrast · 0.34mm/px · 3 of 67 slices shown]
[im 23/67  brain]
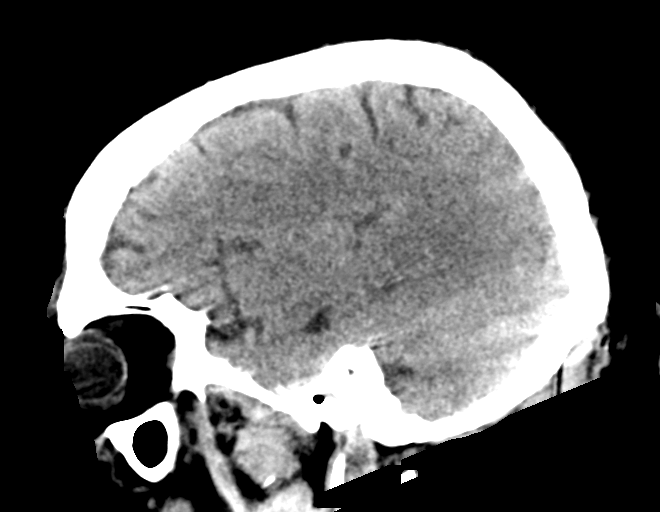
[im 34/67  brain]
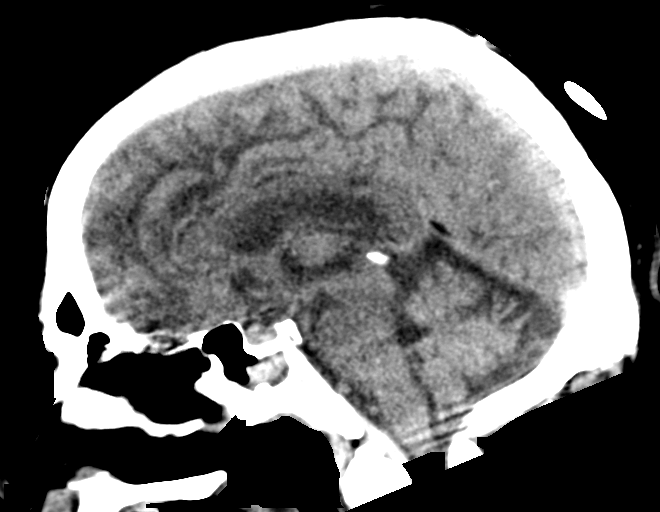
[im 45/67  brain]
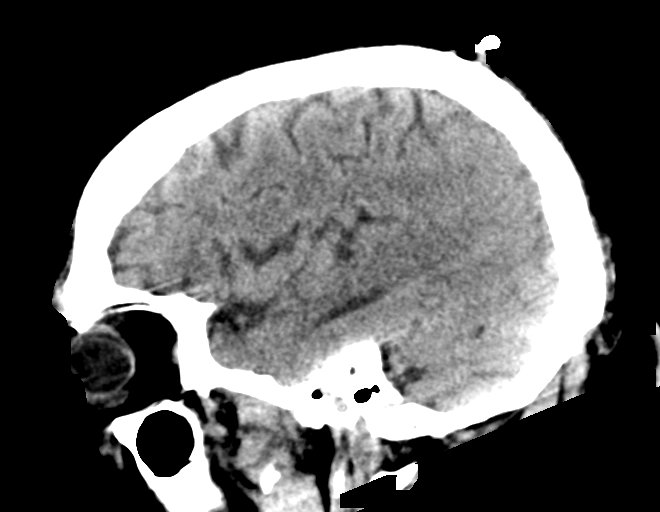

[17 of 37 positions shown; findings below may reference images not displayed]

FINDINGS: Brain: Ventricles, cisterns and other CSF spaces are normal. There
is no mass, mass effect, shift of midline structures or acute
hemorrhage. No evidence of acute infarction.

Vascular: No hyperdense vessel or unexpected calcification.

Skull: Normal. Negative for fracture or focal lesion.

Sinuses/Orbits: Orbits are normal. Paranasal sinuses are well
developed and well aerated with mild opacification over the sphenoid
sinus unchanged. Opacification over the left mastoid air cells new
compared to the previous exam.

Other: None.
IMPRESSION: 1. No acute findings.
2. Chronic sinus inflammatory change. New opacification over the
left mastoid air cells.

## 2020-08-15 ENCOUNTER — Other Ambulatory Visit (HOSPITAL_COMMUNITY): Payer: Self-pay

## 2020-08-15 DIAGNOSIS — N179 Acute kidney failure, unspecified: Secondary | ICD-10-CM | POA: Diagnosis not present

## 2020-08-15 DIAGNOSIS — I4891 Unspecified atrial fibrillation: Secondary | ICD-10-CM | POA: Diagnosis not present

## 2020-08-15 DIAGNOSIS — E876 Hypokalemia: Secondary | ICD-10-CM | POA: Diagnosis not present

## 2020-08-15 DIAGNOSIS — R062 Wheezing: Secondary | ICD-10-CM | POA: Diagnosis not present

## 2020-08-15 DIAGNOSIS — R0602 Shortness of breath: Secondary | ICD-10-CM | POA: Diagnosis not present

## 2020-08-15 DIAGNOSIS — J9691 Respiratory failure, unspecified with hypoxia: Secondary | ICD-10-CM | POA: Diagnosis not present

## 2020-08-15 DIAGNOSIS — J90 Pleural effusion, not elsewhere classified: Secondary | ICD-10-CM | POA: Diagnosis not present

## 2020-08-15 LAB — BRAIN NATRIURETIC PEPTIDE: B Natriuretic Peptide: 1187.3 pg/mL — ABNORMAL HIGH (ref 0.0–100.0)

## 2020-08-15 LAB — CBC
HCT: 32.8 % — ABNORMAL LOW (ref 39.0–52.0)
Hemoglobin: 9.6 g/dL — ABNORMAL LOW (ref 13.0–17.0)
MCH: 27.5 pg (ref 26.0–34.0)
MCHC: 29.3 g/dL — ABNORMAL LOW (ref 30.0–36.0)
MCV: 94 fL (ref 80.0–100.0)
Platelets: 234 10*3/uL (ref 150–400)
RBC: 3.49 MIL/uL — ABNORMAL LOW (ref 4.22–5.81)
RDW: 16.9 % — ABNORMAL HIGH (ref 11.5–15.5)
WBC: 13.7 10*3/uL — ABNORMAL HIGH (ref 4.0–10.5)
nRBC: 0.4 % — ABNORMAL HIGH (ref 0.0–0.2)

## 2020-08-15 LAB — BASIC METABOLIC PANEL
Anion gap: 6 (ref 5–15)
BUN: 39 mg/dL — ABNORMAL HIGH (ref 8–23)
CO2: 33 mmol/L — ABNORMAL HIGH (ref 22–32)
Calcium: 9.1 mg/dL (ref 8.9–10.3)
Chloride: 107 mmol/L (ref 98–111)
Creatinine, Ser: 0.85 mg/dL (ref 0.61–1.24)
GFR, Estimated: >60 mL/min (ref >60)
Glucose, Bld: 160 mg/dL — ABNORMAL HIGH (ref 70–99)
Potassium: 4.2 mmol/L (ref 3.5–5.1)
Sodium: 146 mmol/L — ABNORMAL HIGH (ref 135–145)

## 2020-08-15 LAB — AMMONIA: Ammonia: 46 umol/L — ABNORMAL HIGH (ref 9–35)

## 2020-08-15 IMAGING — DX DG CHEST 1V PORT
1 series · 1 of 1 positions shown · non-contrast
Comparison: Portable chest [DATE] and earlier.

CLINICAL DATA: 80-year-old male with shortness of breath and
wheezing. Unresponsive.

EXAM:
PORTABLE CHEST 1 VIEW

[chest ap]
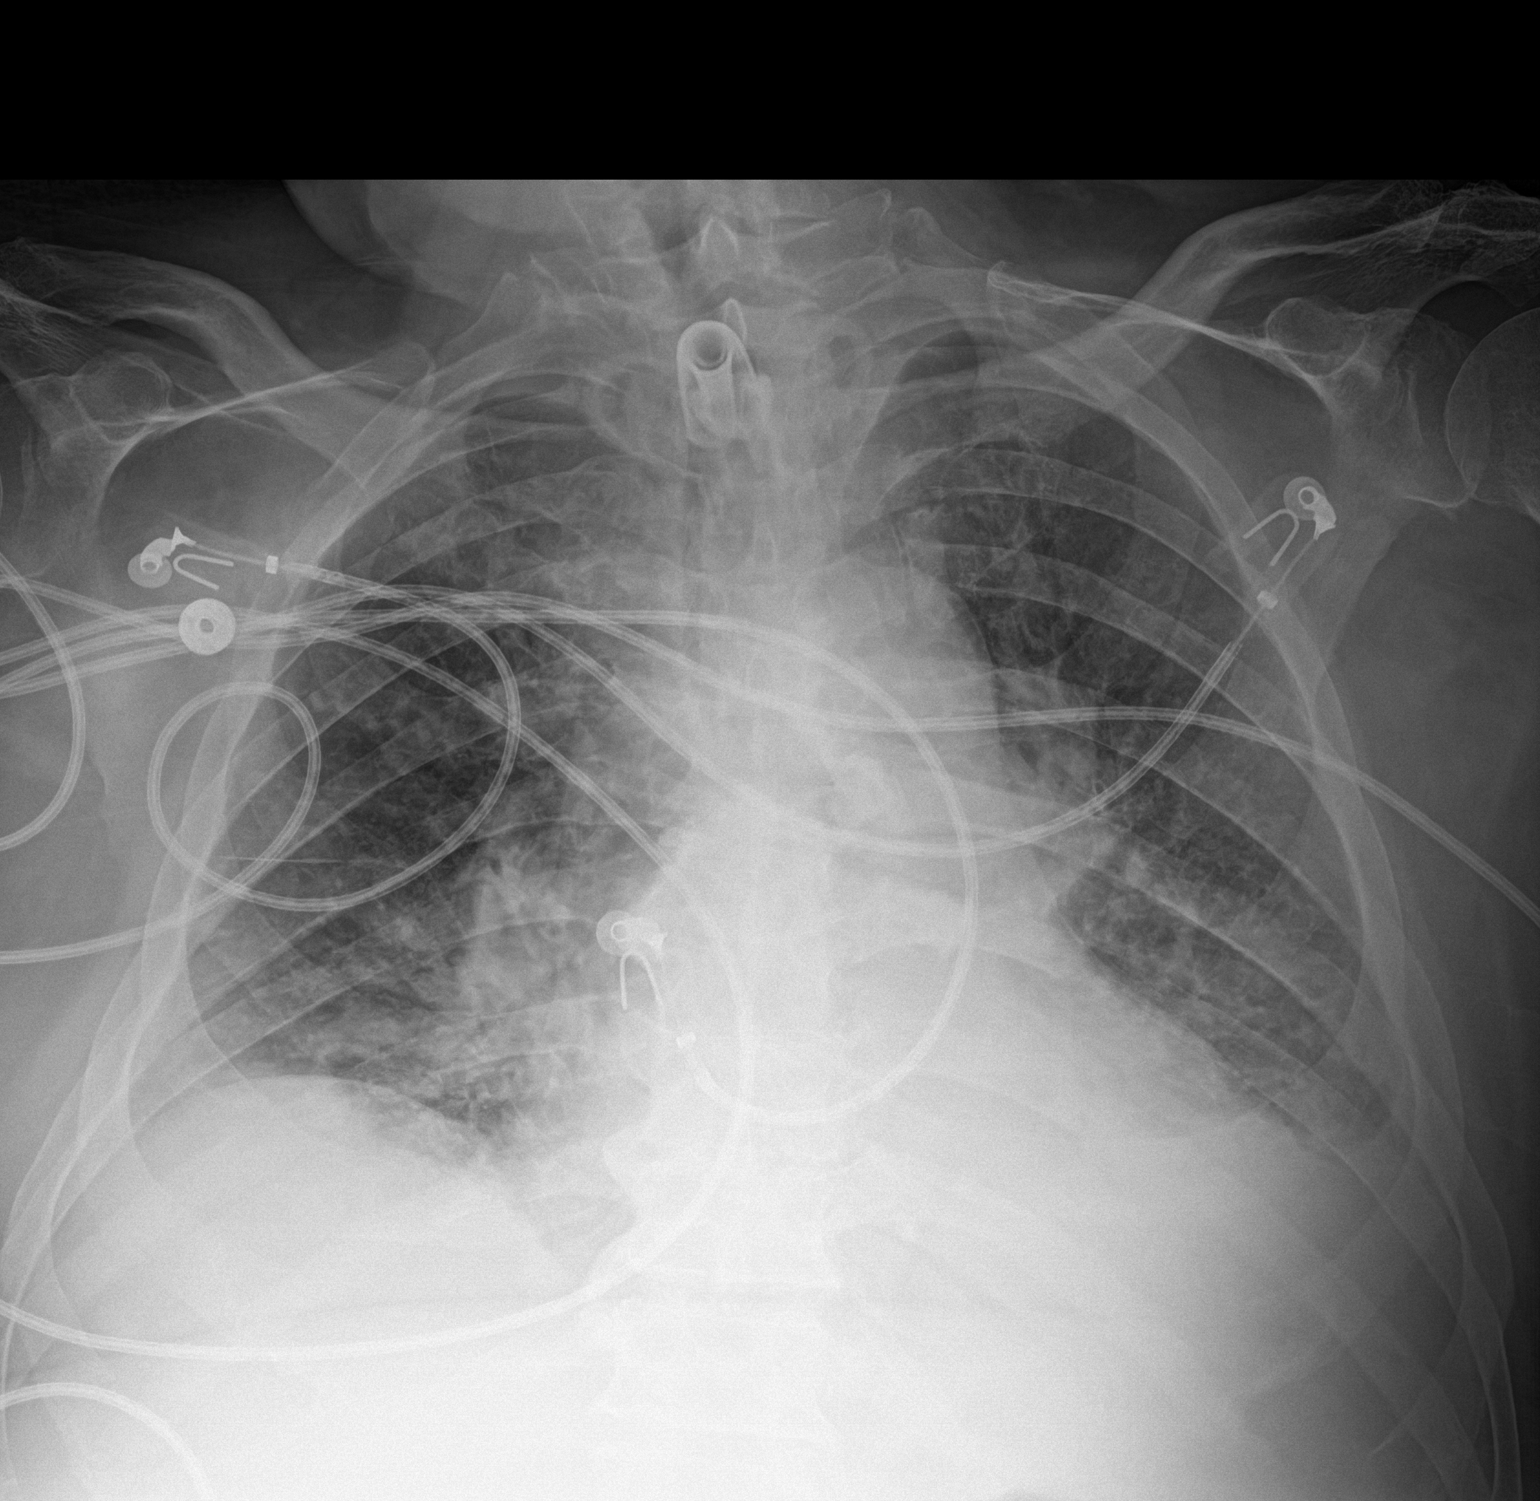

[1 of 1 positions shown; findings below may reference images not displayed]

FINDINGS: Portable AP semi upright view at [7K] hours. Stable tracheostomy.
Stable somewhat low lung volumes. Stable cardiac size and
mediastinal contours. No pneumothorax. Veiling left lung base
opacity compatible with pleural effusion is stable. Perihilar and
basilar predominant increased interstitial markings appear stable
and most reminiscent of vascular congestion. No areas of worsening
ventilation. Paucity of bowel gas in the upper abdomen. No acute
osseous abnormality identified.
IMPRESSION: Lower lung volumes but otherwise stable ventilation from portable
chest x-ray last month with suspected pulmonary interstitial edema
and small left pleural effusion.

## 2020-08-16 DIAGNOSIS — J9691 Respiratory failure, unspecified with hypoxia: Secondary | ICD-10-CM | POA: Diagnosis not present

## 2020-08-16 DIAGNOSIS — N179 Acute kidney failure, unspecified: Secondary | ICD-10-CM | POA: Diagnosis not present

## 2020-08-16 DIAGNOSIS — S02651A Fracture of angle of right mandible, initial encounter for closed fracture: Secondary | ICD-10-CM | POA: Diagnosis not present

## 2020-08-16 DIAGNOSIS — I4891 Unspecified atrial fibrillation: Secondary | ICD-10-CM | POA: Diagnosis not present

## 2020-08-16 LAB — CBC
HCT: 36 % — ABNORMAL LOW (ref 39.0–52.0)
Hemoglobin: 10.3 g/dL — ABNORMAL LOW (ref 13.0–17.0)
MCH: 27 pg (ref 26.0–34.0)
MCHC: 28.6 g/dL — ABNORMAL LOW (ref 30.0–36.0)
MCV: 94.5 fL (ref 80.0–100.0)
Platelets: 192 10*3/uL (ref 150–400)
RBC: 3.81 MIL/uL — ABNORMAL LOW (ref 4.22–5.81)
RDW: 17 % — ABNORMAL HIGH (ref 11.5–15.5)
WBC: 13.3 10*3/uL — ABNORMAL HIGH (ref 4.0–10.5)
nRBC: 0.4 % — ABNORMAL HIGH (ref 0.0–0.2)

## 2020-08-16 LAB — BASIC METABOLIC PANEL
Anion gap: 10 (ref 5–15)
BUN: 41 mg/dL — ABNORMAL HIGH (ref 8–23)
CO2: 33 mmol/L — ABNORMAL HIGH (ref 22–32)
Calcium: 9 mg/dL (ref 8.9–10.3)
Chloride: 102 mmol/L (ref 98–111)
Creatinine, Ser: 0.8 mg/dL (ref 0.61–1.24)
GFR, Estimated: >60 mL/min (ref >60)
Glucose, Bld: 149 mg/dL — ABNORMAL HIGH (ref 70–99)
Potassium: 3.9 mmol/L (ref 3.5–5.1)
Sodium: 145 mmol/L (ref 135–145)

## 2020-08-17 DIAGNOSIS — I4891 Unspecified atrial fibrillation: Secondary | ICD-10-CM | POA: Diagnosis not present

## 2020-08-17 DIAGNOSIS — E876 Hypokalemia: Secondary | ICD-10-CM | POA: Diagnosis not present

## 2020-08-17 DIAGNOSIS — J9691 Respiratory failure, unspecified with hypoxia: Secondary | ICD-10-CM | POA: Diagnosis not present

## 2020-08-17 DIAGNOSIS — N179 Acute kidney failure, unspecified: Secondary | ICD-10-CM | POA: Diagnosis not present

## 2020-08-17 LAB — EXPECTORATED SPUTUM ASSESSMENT W GRAM STAIN, RFLX TO RESP C

## 2020-08-18 DIAGNOSIS — J9691 Respiratory failure, unspecified with hypoxia: Secondary | ICD-10-CM | POA: Diagnosis not present

## 2020-08-18 DIAGNOSIS — N179 Acute kidney failure, unspecified: Secondary | ICD-10-CM | POA: Diagnosis not present

## 2020-08-18 DIAGNOSIS — E876 Hypokalemia: Secondary | ICD-10-CM | POA: Diagnosis not present

## 2020-08-18 DIAGNOSIS — I4891 Unspecified atrial fibrillation: Secondary | ICD-10-CM | POA: Diagnosis not present

## 2020-08-19 DIAGNOSIS — I482 Chronic atrial fibrillation, unspecified: Secondary | ICD-10-CM | POA: Diagnosis not present

## 2020-08-19 DIAGNOSIS — J9621 Acute and chronic respiratory failure with hypoxia: Secondary | ICD-10-CM | POA: Diagnosis not present

## 2020-08-19 DIAGNOSIS — J9691 Respiratory failure, unspecified with hypoxia: Secondary | ICD-10-CM | POA: Diagnosis not present

## 2020-08-19 DIAGNOSIS — I4891 Unspecified atrial fibrillation: Secondary | ICD-10-CM | POA: Diagnosis not present

## 2020-08-19 DIAGNOSIS — G9340 Encephalopathy, unspecified: Secondary | ICD-10-CM | POA: Diagnosis not present

## 2020-08-19 DIAGNOSIS — N179 Acute kidney failure, unspecified: Secondary | ICD-10-CM | POA: Diagnosis not present

## 2020-08-19 LAB — CULTURE, RESPIRATORY W GRAM STAIN: Gram Stain: NONE SEEN

## 2020-08-19 LAB — CBC
HCT: 30.7 % — ABNORMAL LOW (ref 39.0–52.0)
Hemoglobin: 8.9 g/dL — ABNORMAL LOW (ref 13.0–17.0)
MCH: 26.7 pg (ref 26.0–34.0)
MCHC: 29 g/dL — ABNORMAL LOW (ref 30.0–36.0)
MCV: 92.2 fL (ref 80.0–100.0)
Platelets: 190 10*3/uL (ref 150–400)
RBC: 3.33 MIL/uL — ABNORMAL LOW (ref 4.22–5.81)
RDW: 16.8 % — ABNORMAL HIGH (ref 11.5–15.5)
WBC: 12.2 10*3/uL — ABNORMAL HIGH (ref 4.0–10.5)
nRBC: 0.2 % (ref 0.0–0.2)

## 2020-08-19 LAB — BASIC METABOLIC PANEL
Anion gap: 3 — ABNORMAL LOW (ref 5–15)
BUN: 23 mg/dL (ref 8–23)
CO2: 39 mmol/L — ABNORMAL HIGH (ref 22–32)
Calcium: 8.3 mg/dL — ABNORMAL LOW (ref 8.9–10.3)
Chloride: 92 mmol/L — ABNORMAL LOW (ref 98–111)
Creatinine, Ser: 0.71 mg/dL (ref 0.61–1.24)
GFR, Estimated: >60 mL/min (ref >60)
Glucose, Bld: 143 mg/dL — ABNORMAL HIGH (ref 70–99)
Potassium: 3.8 mmol/L (ref 3.5–5.1)
Sodium: 134 mmol/L — ABNORMAL LOW (ref 135–145)
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
## 2020-08-20 ENCOUNTER — Other Ambulatory Visit (HOSPITAL_COMMUNITY): Payer: Self-pay

## 2020-08-20 DIAGNOSIS — J9691 Respiratory failure, unspecified with hypoxia: Secondary | ICD-10-CM | POA: Diagnosis not present

## 2020-08-20 DIAGNOSIS — S02601A Fracture of unspecified part of body of right mandible, initial encounter for closed fracture: Secondary | ICD-10-CM | POA: Diagnosis not present

## 2020-08-20 DIAGNOSIS — N179 Acute kidney failure, unspecified: Secondary | ICD-10-CM | POA: Diagnosis not present

## 2020-08-20 DIAGNOSIS — H748X2 Other specified disorders of left middle ear and mastoid: Secondary | ICD-10-CM | POA: Diagnosis not present

## 2020-08-20 DIAGNOSIS — G9340 Encephalopathy, unspecified: Secondary | ICD-10-CM | POA: Diagnosis not present

## 2020-08-20 DIAGNOSIS — M2548 Effusion, other site: Secondary | ICD-10-CM | POA: Diagnosis not present

## 2020-08-20 DIAGNOSIS — I4891 Unspecified atrial fibrillation: Secondary | ICD-10-CM | POA: Diagnosis not present
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
## 2020-08-20 MED ORDER — IOHEXOL 300 MG/ML  SOLN
75.0000 mL | Freq: Once | INTRAMUSCULAR | Status: AC | PRN
  Administered 2020-08-20: 75 mL via INTRAVENOUS

## 2020-08-21 DIAGNOSIS — S02651A Fracture of angle of right mandible, initial encounter for closed fracture: Secondary | ICD-10-CM | POA: Diagnosis not present

## 2020-08-21 DIAGNOSIS — J9691 Respiratory failure, unspecified with hypoxia: Secondary | ICD-10-CM | POA: Diagnosis not present

## 2020-08-21 DIAGNOSIS — I4891 Unspecified atrial fibrillation: Secondary | ICD-10-CM | POA: Diagnosis not present

## 2020-08-21 DIAGNOSIS — N179 Acute kidney failure, unspecified: Secondary | ICD-10-CM | POA: Diagnosis not present

## 2020-08-21 LAB — VITAMIN B12: Vitamin B-12: 776 pg/mL (ref 180–914)

## 2020-08-21 LAB — TSH: TSH: 6.873 u[IU]/mL — ABNORMAL HIGH (ref 0.350–4.500)

## 2020-08-22 DIAGNOSIS — S02651A Fracture of angle of right mandible, initial encounter for closed fracture: Secondary | ICD-10-CM | POA: Diagnosis not present

## 2020-08-22 DIAGNOSIS — I4891 Unspecified atrial fibrillation: Secondary | ICD-10-CM | POA: Diagnosis not present

## 2020-08-22 DIAGNOSIS — N179 Acute kidney failure, unspecified: Secondary | ICD-10-CM | POA: Diagnosis not present

## 2020-08-22 DIAGNOSIS — J9691 Respiratory failure, unspecified with hypoxia: Secondary | ICD-10-CM | POA: Diagnosis not present

## 2020-08-23 DIAGNOSIS — I4891 Unspecified atrial fibrillation: Secondary | ICD-10-CM | POA: Diagnosis not present

## 2020-08-23 DIAGNOSIS — S02651A Fracture of angle of right mandible, initial encounter for closed fracture: Secondary | ICD-10-CM | POA: Diagnosis not present

## 2020-08-23 DIAGNOSIS — N179 Acute kidney failure, unspecified: Secondary | ICD-10-CM | POA: Diagnosis not present

## 2020-08-23 DIAGNOSIS — J9691 Respiratory failure, unspecified with hypoxia: Secondary | ICD-10-CM | POA: Diagnosis not present

## 2020-08-23 NOTE — Progress Notes (Signed)
Pulmonary [place]  PROGRESS NOTE     ALDRICK DERRIG  ACZ:[id number]  DOB: 07-29-1940   DOA: 07/08/2020  Referring Physician: Merton Border, MD  HPI: HIEU HERMS is a 80 y.o. male seen for follow up of Acute on Chronic Respiratory Failure.  Patient currently is on T collar on 40% FiO2 should be able to start capping trial  Medications: Reviewed on Rounds  Physical Exam:  Vitals: Temperature is 97.4 pulse 88 respiratory rate 23 blood pressure is 130/73 saturations 97  Ventilator Settings on T collar FiO2 40%  . General: Comfortable at this time . Eyes: Grossly normal lids, irises & conjunctiva . ENT: grossly tongue is normal . Neck: no obvious mass . Cardiovascular: S1 S2 normal no gallop . Respiratory: No rhonchi no rales are noted at this time . Abdomen: soft . Skin: no rash seen on limited exam . Musculoskeletal: not rigid . Psychiatric:unable to assess . Neurologic: no seizure no involuntary movements         Lab Data:   Basic Metabolic Panel: Recent Labs  Lab 08/19/20 0511  NA 134*  K 3.8  CL 92*  CO2 39*  GLUCOSE 143*  BUN 23  CREATININE 0.71  CALCIUM 8.3*    ABG: No results for input(s): PHART, PCO2ART, PO2ART, HCO3, O2SAT in the last 168 hours.  Liver Function Tests: No results for input(s): AST, ALT, ALKPHOS, BILITOT, PROT, ALBUMIN in the last 168 hours. No results for input(s): LIPASE, AMYLASE in the last 168 hours. No results for input(s): AMMONIA in the last 168 hours.  CBC: Recent Labs  Lab 08/19/20 0511  WBC 12.2*  HGB 8.9*  HCT 30.7*  MCV 92.2  PLT 190    Cardiac Enzymes: No results for input(s): CKTOTAL, CKMB, CKMBINDEX, TROPONINI in the last 168 hours.  BNP (last 3 results) Recent Labs    08/15/20 1100  BNP 1,187.3*    ProBNP (last 3 results) No results for input(s): PROBNP in the last 8760 hours.  Radiological Exams: No results  found.  Assessment/Plan Active Problems:   Acute on chronic respiratory failure with hypoxia (HCC)   Healthcare-associated pneumonia   Cardiac arrest (HCC)   Chronic atrial fibrillation ([place])   1. Acute on chronic respiratory failure with hypoxia patient is comfortable right now on the T collar has done well we will try to start with capping trials 2. Healthcare associated pneumonia treated slowly improving 3. Cardiac arrest rhythm has been stable 4. Chronic atrial fibrillation rate is controlled at this time   I have personally seen and evaluated the patient, evaluated laboratory and imaging results, formulated the assessment and plan and placed orders. The Patient requires high complexity decision making with multiple systems involvement.  Rounds were done with the Respiratory Therapy Director and Staff therapists and discussed with nursing staff also.  [name], MD [facility] Pulmonary Critical Care Medicine Sleep Medicine

## 2020-08-24 DIAGNOSIS — N179 Acute kidney failure, unspecified: Secondary | ICD-10-CM | POA: Diagnosis not present

## 2020-08-24 DIAGNOSIS — I4891 Unspecified atrial fibrillation: Secondary | ICD-10-CM | POA: Diagnosis not present

## 2020-08-24 DIAGNOSIS — G9340 Encephalopathy, unspecified: Secondary | ICD-10-CM | POA: Diagnosis not present

## 2020-08-24 DIAGNOSIS — J9691 Respiratory failure, unspecified with hypoxia: Secondary | ICD-10-CM | POA: Diagnosis not present

## 2020-08-24 NOTE — Progress Notes (Signed)
Pulmonary [place]  PROGRESS NOTE     AUBERT CHOYCE  FVC:[id number]  DOB: 17-Dec-1940   DOA: 07/08/2020  Referring Physician: Merton Border, MD  HPI: Daniel [name] is a 80 y.o. male seen for follow up of Acute on Chronic Respiratory Failure.  Patient is T collar has been on 40% FiO2 secretions still remain copious  Medications: Reviewed on Rounds  Physical Exam:  Vitals: Temperature is 98.0 pulse 89 respiratory rate is 18 blood pressure 95/50 saturations 99%  Ventilator Settings off the ventilator on T collar with an FiO2 of 40%  . General: Comfortable at this time . Eyes: Grossly normal lids, irises & conjunctiva . ENT: grossly tongue is normal . Neck: no obvious mass . Cardiovascular: S1 S2 normal no gallop . Respiratory: No rhonchi no rales noted at this time . Abdomen: soft . Skin: no rash seen on limited exam . Musculoskeletal: not rigid . Psychiatric:unable to assess . Neurologic: no seizure no involuntary movements         Lab Data:   Basic Metabolic Panel: Recent Labs  Lab 08/19/20 0511  NA 134*  K 3.8  CL 92*  CO2 39*  GLUCOSE 143*  BUN 23  CREATININE 0.71  CALCIUM 8.3*    ABG: No results for input(s): PHART, PCO2ART, PO2ART, HCO3, O2SAT in the last 168 hours.  Liver Function Tests: No results for input(s): AST, ALT, ALKPHOS, BILITOT, PROT, ALBUMIN in the last 168 hours. No results for input(s): LIPASE, AMYLASE in the last 168 hours. No results for input(s): AMMONIA in the last 168 hours.  CBC: Recent Labs  Lab 08/19/20 0511  WBC 12.2*  HGB 8.9*  HCT 30.7*  MCV 92.2  PLT 190    Cardiac Enzymes: No results for input(s): CKTOTAL, CKMB, CKMBINDEX, TROPONINI in the last 168 hours.  BNP (last 3 results) Recent Labs    08/15/20 1100  BNP 1,187.3*    ProBNP (last 3 results) No results for input(s): PROBNP in the last 8760 hours.  Radiological  Exams: No results found.  Assessment/Plan Active Problems:   Acute on chronic respiratory failure with hypoxia (HCC)   Healthcare-associated pneumonia   Cardiac arrest (HCC)   Chronic atrial fibrillation ([place])   1. Acute on chronic respiratory failure with hypoxia we will continue with T collar trials on 40% FiO2 titrate as tolerated continue pulmonary toilet. 2. Healthcare associated pneumonia has been treated slow improvement 3. Cardiac arrest rhythm has been stable 4. Chronic atrial fibrillation rate controlled   I have personally seen and evaluated the patient, evaluated laboratory and imaging results, formulated the assessment and plan and placed orders. The Patient requires high complexity decision making with multiple systems involvement.  Rounds were done with the Respiratory Therapy Director and Staff therapists and discussed with nursing staff also.  [name], MD [facility] Pulmonary Critical Care Medicine Sleep Medicine

## 2020-08-25 DIAGNOSIS — J9691 Respiratory failure, unspecified with hypoxia: Secondary | ICD-10-CM | POA: Diagnosis not present

## 2020-08-25 DIAGNOSIS — I482 Chronic atrial fibrillation, unspecified: Secondary | ICD-10-CM | POA: Diagnosis not present

## 2020-08-25 DIAGNOSIS — J9621 Acute and chronic respiratory failure with hypoxia: Secondary | ICD-10-CM | POA: Diagnosis not present

## 2020-08-25 DIAGNOSIS — N179 Acute kidney failure, unspecified: Secondary | ICD-10-CM | POA: Diagnosis not present

## 2020-08-25 DIAGNOSIS — I469 Cardiac arrest, cause unspecified: Secondary | ICD-10-CM | POA: Diagnosis not present

## 2020-08-25 DIAGNOSIS — G9340 Encephalopathy, unspecified: Secondary | ICD-10-CM | POA: Diagnosis not present

## 2020-08-25 DIAGNOSIS — I4891 Unspecified atrial fibrillation: Secondary | ICD-10-CM | POA: Diagnosis not present

## 2020-08-25 NOTE — Progress Notes (Signed)
Pulmonary [place]  PROGRESS NOTE     Daniel [name]  URK:[id number]  DOB: 1941/01/20   DOA: 07/08/2020  Referring Physician: Merton Border, MD  HPI: Daniel [name] is a 80 y.o. male seen for follow up of Acute on Chronic Respiratory Failure.  Off the ventilator right now on T collar has been on 35% FiO2  Medications: Reviewed on Rounds  Physical Exam:  Vitals: Temperature is 98 pulse 96 respiratory 27 blood pressure is 128/67 saturations 94%  Ventilator Settings on T collar with an FiO2 of 35%  . General: Comfortable at this time . Eyes: Grossly normal lids, irises & conjunctiva . ENT: grossly tongue is normal . Neck: no obvious mass . Cardiovascular: S1 S2 normal no gallop . Respiratory: Scattered rhonchi no rales are noted at this time . Abdomen: soft . Skin: no rash seen on limited exam . Musculoskeletal: not rigid . Psychiatric:unable to assess . Neurologic: no seizure no involuntary movements         Lab Data:   Basic Metabolic Panel: Recent Labs  Lab 08/19/20 0511  NA 134*  K 3.8  CL 92*  CO2 39*  GLUCOSE 143*  BUN 23  CREATININE 0.71  CALCIUM 8.3*    ABG: No results for input(s): PHART, PCO2ART, PO2ART, HCO3, O2SAT in the last 168 hours.  Liver Function Tests: No results for input(s): AST, ALT, ALKPHOS, BILITOT, PROT, ALBUMIN in the last 168 hours. No results for input(s): LIPASE, AMYLASE in the last 168 hours. No results for input(s): AMMONIA in the last 168 hours.  CBC: Recent Labs  Lab 08/19/20 0511  WBC 12.2*  HGB 8.9*  HCT 30.7*  MCV 92.2  PLT 190    Cardiac Enzymes: No results for input(s): CKTOTAL, CKMB, CKMBINDEX, TROPONINI in the last 168 hours.  BNP (last 3 results) Recent Labs    08/15/20 1100  BNP 1,187.3*    ProBNP (last 3 results) No results for input(s): PROBNP in the last 8760 hours.  Radiological Exams: No results  found.  Assessment/Plan Active Problems:   Acute on chronic respiratory failure with hypoxia (HCC)   Healthcare-associated pneumonia   Cardiac arrest (HCC)   Chronic atrial fibrillation ([place])   1. Acute on chronic respiratory failure hypoxia we will continue with T collar at this time patient is on 35% FiO2.  We will continue to follow along. 2. Healthcare associated pneumonia treated supportive care 3. Cardiac arrest rhythm stable 4. Chronic atrial fibrillation rate is controlled   I have personally seen and evaluated the patient, evaluated laboratory and imaging results, formulated the assessment and plan and placed orders. The Patient requires high complexity decision making with multiple systems involvement.  Rounds were done with the Respiratory Therapy Director and Staff therapists and discussed with nursing staff also.  [name], MD [facility] Pulmonary Critical Care Medicine Sleep Medicine

## 2020-08-26 DIAGNOSIS — J9621 Acute and chronic respiratory failure with hypoxia: Secondary | ICD-10-CM | POA: Diagnosis not present

## 2020-08-26 DIAGNOSIS — G9340 Encephalopathy, unspecified: Secondary | ICD-10-CM | POA: Diagnosis not present

## 2020-08-26 DIAGNOSIS — J9691 Respiratory failure, unspecified with hypoxia: Secondary | ICD-10-CM | POA: Diagnosis not present

## 2020-08-26 DIAGNOSIS — I482 Chronic atrial fibrillation, unspecified: Secondary | ICD-10-CM | POA: Diagnosis not present

## 2020-08-26 DIAGNOSIS — I4891 Unspecified atrial fibrillation: Secondary | ICD-10-CM | POA: Diagnosis not present

## 2020-08-26 DIAGNOSIS — I469 Cardiac arrest, cause unspecified: Secondary | ICD-10-CM | POA: Diagnosis not present

## 2020-08-26 DIAGNOSIS — N179 Acute kidney failure, unspecified: Secondary | ICD-10-CM | POA: Diagnosis not present

## 2020-08-26 NOTE — Progress Notes (Signed)
Pulmonary [name]  PROGRESS NOTE     Daniel [name]  [id number]:[id number]  DOB: 1941/02/16   DOA: 07/08/2020  Referring Physician: Merton Border, MD  HPI: Daniel [name] is a 80 y.o. male seen for follow up of Acute on Chronic Respiratory Failure.  Patient is on T collar right now on 35% FiO2 should be able to start capping  Medications: Reviewed on Rounds  Physical Exam:  Vitals: Temperature 97.5 pulse 86 respiratory 20 blood pressure is 110/62 saturations 97%  Ventilator Settings on T collar with an FiO2 of 35%  . General: Comfortable at this time . Eyes: Grossly normal lids, irises & conjunctiva . ENT: grossly tongue is normal . Neck: no obvious mass . Cardiovascular: S1 S2 normal no gallop . Respiratory: Scattered rhonchi expansion is equal at this time . Abdomen: soft . Skin: no rash seen on limited exam . Musculoskeletal: not rigid . Psychiatric:unable to assess . Neurologic: no seizure no involuntary movements         Lab Data:   Basic Metabolic Panel: No results for input(s): NA, K, CL, CO2, GLUCOSE, BUN, CREATININE, CALCIUM, MG, PHOS in the last 168 hours.  ABG: No results for input(s): PHART, PCO2ART, PO2ART, HCO3, O2SAT in the last 168 hours.  Liver Function Tests: No results for input(s): AST, ALT, ALKPHOS, BILITOT, PROT, ALBUMIN in the last 168 hours. No results for input(s): LIPASE, AMYLASE in the last 168 hours. No results for input(s): AMMONIA in the last 168 hours.  CBC: No results for input(s): WBC, NEUTROABS, HGB, HCT, MCV, PLT in the last 168 hours.  Cardiac Enzymes: No results for input(s): CKTOTAL, CKMB, CKMBINDEX, TROPONINI in the last 168 hours.  BNP (last 3 results) Recent Labs    08/15/20 1100  BNP 1,187.3*    ProBNP (last 3 results) No results for input(s): PROBNP in the last 8760 hours.  Radiological Exams: No results  found.  Assessment/Plan Active Problems:   Acute on chronic respiratory failure with hypoxia (HCC)   Healthcare-associated pneumonia   Cardiac arrest (HCC)   Chronic atrial fibrillation ([place])   1. Acute on chronic respiratory failure hypoxia plan secondary to continue with weaning on T collar advance to capping 2. Healthcare associated pneumonia treated supportive care 3. Cardiac arrest rhythm is stable at this time 4. Chronic atrial fibrillation rate is controlled   I have personally seen and evaluated the patient, evaluated laboratory and imaging results, formulated the assessment and plan and placed orders. The Patient requires high complexity decision making with multiple systems involvement.  Rounds were done with the Respiratory Therapy Director and Staff therapists and discussed with nursing staff also.  [name], MD [facility] Pulmonary Critical Care Medicine Sleep Medicine

## 2020-08-27 DIAGNOSIS — J9621 Acute and chronic respiratory failure with hypoxia: Secondary | ICD-10-CM | POA: Diagnosis not present

## 2020-08-27 DIAGNOSIS — I482 Chronic atrial fibrillation, unspecified: Secondary | ICD-10-CM | POA: Diagnosis not present

## 2020-08-27 DIAGNOSIS — N179 Acute kidney failure, unspecified: Secondary | ICD-10-CM | POA: Diagnosis not present

## 2020-08-27 DIAGNOSIS — J9691 Respiratory failure, unspecified with hypoxia: Secondary | ICD-10-CM | POA: Diagnosis not present

## 2020-08-27 DIAGNOSIS — G9340 Encephalopathy, unspecified: Secondary | ICD-10-CM | POA: Diagnosis not present

## 2020-08-27 DIAGNOSIS — I469 Cardiac arrest, cause unspecified: Secondary | ICD-10-CM | POA: Diagnosis not present

## 2020-08-27 DIAGNOSIS — I4891 Unspecified atrial fibrillation: Secondary | ICD-10-CM | POA: Diagnosis not present

## 2020-08-27 LAB — BLOOD GAS, ARTERIAL
Acid-Base Excess: 25.5 mmol/L — ABNORMAL HIGH (ref 0.0–2.0)
Acid-Base Excess: 24.4 mmol/L — ABNORMAL HIGH (ref 0.0–2.0)
Bicarbonate: 52.2 mmol/L — ABNORMAL HIGH (ref 20.0–28.0)
Bicarbonate: 50.1 mmol/L — ABNORMAL HIGH (ref 20.0–28.0)
FIO2: 28
FIO2: 28
O2 Saturation: 95 %
O2 Saturation: 97.1 %
Patient temperature: 37
Patient temperature: 37
pCO2 arterial: 85.6 mmHg (ref 32.0–48.0)
pCO2 arterial: 65.4 mmHg (ref 32.0–48.0)
pH, Arterial: 7.402 (ref 7.350–7.450)
pH, Arterial: 7.497 — ABNORMAL HIGH (ref 7.350–7.450)
pO2, Arterial: 72.7 mmHg — ABNORMAL LOW (ref 83.0–108.0)
pO2, Arterial: 79.5 mmHg — ABNORMAL LOW (ref 83.0–108.0)

## 2020-08-27 LAB — COMPREHENSIVE METABOLIC PANEL
ALT: 130 U/L — ABNORMAL HIGH (ref 0–44)
AST: 84 U/L — ABNORMAL HIGH (ref 15–41)
Albumin: 2.3 g/dL — ABNORMAL LOW (ref 3.5–5.0)
Alkaline Phosphatase: 100 U/L (ref 38–126)
BUN: 55 mg/dL — ABNORMAL HIGH (ref 8–23)
CO2: >50 mmol/L — ABNORMAL HIGH (ref 22–32)
Calcium: 9.2 mg/dL (ref 8.9–10.3)
Chloride: 95 mmol/L — ABNORMAL LOW (ref 98–111)
Creatinine, Ser: 0.81 mg/dL (ref 0.61–1.24)
GFR, Estimated: >60 mL/min (ref >60)
Glucose, Bld: 127 mg/dL — ABNORMAL HIGH (ref 70–99)
Potassium: 3 mmol/L — ABNORMAL LOW (ref 3.5–5.1)
Sodium: 150 mmol/L — ABNORMAL HIGH (ref 135–145)
Total Bilirubin: 0.8 mg/dL (ref 0.3–1.2)
Total Protein: 7.1 g/dL (ref 6.5–8.1)

## 2020-08-27 LAB — CBC
HCT: 35.3 % — ABNORMAL LOW (ref 39.0–52.0)
Hemoglobin: 9.5 g/dL — ABNORMAL LOW (ref 13.0–17.0)
MCH: 27 pg (ref 26.0–34.0)
MCHC: 26.9 g/dL — ABNORMAL LOW (ref 30.0–36.0)
MCV: 100.3 fL — ABNORMAL HIGH (ref 80.0–100.0)
Platelets: 198 10*3/uL (ref 150–400)
RBC: 3.52 MIL/uL — ABNORMAL LOW (ref 4.22–5.81)
RDW: 17.2 % — ABNORMAL HIGH (ref 11.5–15.5)
WBC: 10 10*3/uL (ref 4.0–10.5)
nRBC: 0 % (ref 0.0–0.2)

## 2020-08-27 NOTE — Progress Notes (Signed)
Pulmonary [place]  PROGRESS NOTE     CHRISTAPHER GILLIAN  JGO:[id number]  DOB: [birth year]   DOA: 07/08/2020  Referring Physician: Merton Border, MD  HPI: Daniel [name] is a 80 y.o. male seen for follow up of Acute on Chronic Respiratory Failure.  Patient is capping right now off ventilator without any distress  Medications: Reviewed on Rounds  Physical Exam:  Vitals: Temperature is 97.8 pulse 76 respiratory 18 blood pressure is 106/53 saturations 98%  Ventilator Settings capping off the vent  . General: Comfortable at this time . Eyes: Grossly normal lids, irises & conjunctiva . ENT: grossly tongue is normal . Neck: no obvious mass . Cardiovascular: S1 S2 normal no gallop . Respiratory: No rhonchi very coarse breath sounds . Abdomen: soft . Skin: no rash seen on limited exam . Musculoskeletal: not rigid . Psychiatric:unable to assess . Neurologic: no seizure no involuntary movements         Lab Data:   Basic Metabolic Panel: Recent Labs  Lab 08/27/20 0409  NA 150*  K 3.0*  CL 95*  CO2 >50*  GLUCOSE 127*  BUN 55*  CREATININE 0.81  CALCIUM 9.2    ABG: Recent Labs  Lab 08/27/20 1001  PHART 7.402  PCO2ART 85.6*  PO2ART 72.7*  HCO3 52.2*  O2SAT 95.0    Liver Function Tests: Recent Labs  Lab 08/27/20 0409  AST 84*  ALT 130*  ALKPHOS 100  BILITOT 0.8  PROT 7.1  ALBUMIN 2.3*   No results for input(s): LIPASE, AMYLASE in the last 168 hours. No results for input(s): AMMONIA in the last 168 hours.  CBC: Recent Labs  Lab 08/27/20 0409  WBC 10.0  HGB 9.5*  HCT 35.3*  MCV 100.3*  PLT 198    Cardiac Enzymes: No results for input(s): CKTOTAL, CKMB, CKMBINDEX, TROPONINI in the last 168 hours.  BNP (last 3 results) Recent Labs    08/15/20 1100  BNP 1,187.3*    ProBNP (last 3 results) No results for input(s): PROBNP in the last 8760 hours.  Radiological  Exams: No results found.  Assessment/Plan Active Problems:   Acute on chronic respiratory failure with hypoxia (HCC)   Healthcare-associated pneumonia   Cardiac arrest (HCC)   Chronic atrial fibrillation ([place])   1. Acute on chronic respiratory failure hypoxia we will continue with capping trials as ordered 2. Healthcare associated pneumonia treated slow improvement 3. Cardiac arrest rhythm is stable we will continue to monitor 4. Chronic atrial fibrillation rate is controlled   I have personally seen and evaluated the patient, evaluated laboratory and imaging results, formulated the assessment and plan and placed orders. The Patient requires high complexity decision making with multiple systems involvement.  Rounds were done with the Respiratory Therapy Director and Staff therapists and discussed with nursing staff also.  [name], MD [facility] Pulmonary Critical Care Medicine Sleep Medicine

## 2020-08-28 ENCOUNTER — Other Ambulatory Visit (HOSPITAL_COMMUNITY): Payer: Self-pay

## 2020-08-28 DIAGNOSIS — J9691 Respiratory failure, unspecified with hypoxia: Secondary | ICD-10-CM | POA: Diagnosis not present

## 2020-08-28 DIAGNOSIS — G9340 Encephalopathy, unspecified: Secondary | ICD-10-CM | POA: Diagnosis not present

## 2020-08-28 DIAGNOSIS — J811 Chronic pulmonary edema: Secondary | ICD-10-CM | POA: Diagnosis not present

## 2020-08-28 DIAGNOSIS — N179 Acute kidney failure, unspecified: Secondary | ICD-10-CM | POA: Diagnosis not present

## 2020-08-28 DIAGNOSIS — I4891 Unspecified atrial fibrillation: Secondary | ICD-10-CM | POA: Diagnosis not present

## 2020-08-28 LAB — POTASSIUM: Potassium: 3.3 mmol/L — ABNORMAL LOW (ref 3.5–5.1)

## 2020-08-28 IMAGING — DX DG CHEST 1V PORT
1 series · 1 of 1 positions shown · non-contrast
Comparison: [DATE]

CLINICAL DATA: Pulmonary edema.

EXAM:
PORTABLE CHEST 1 VIEW

[chest ap]
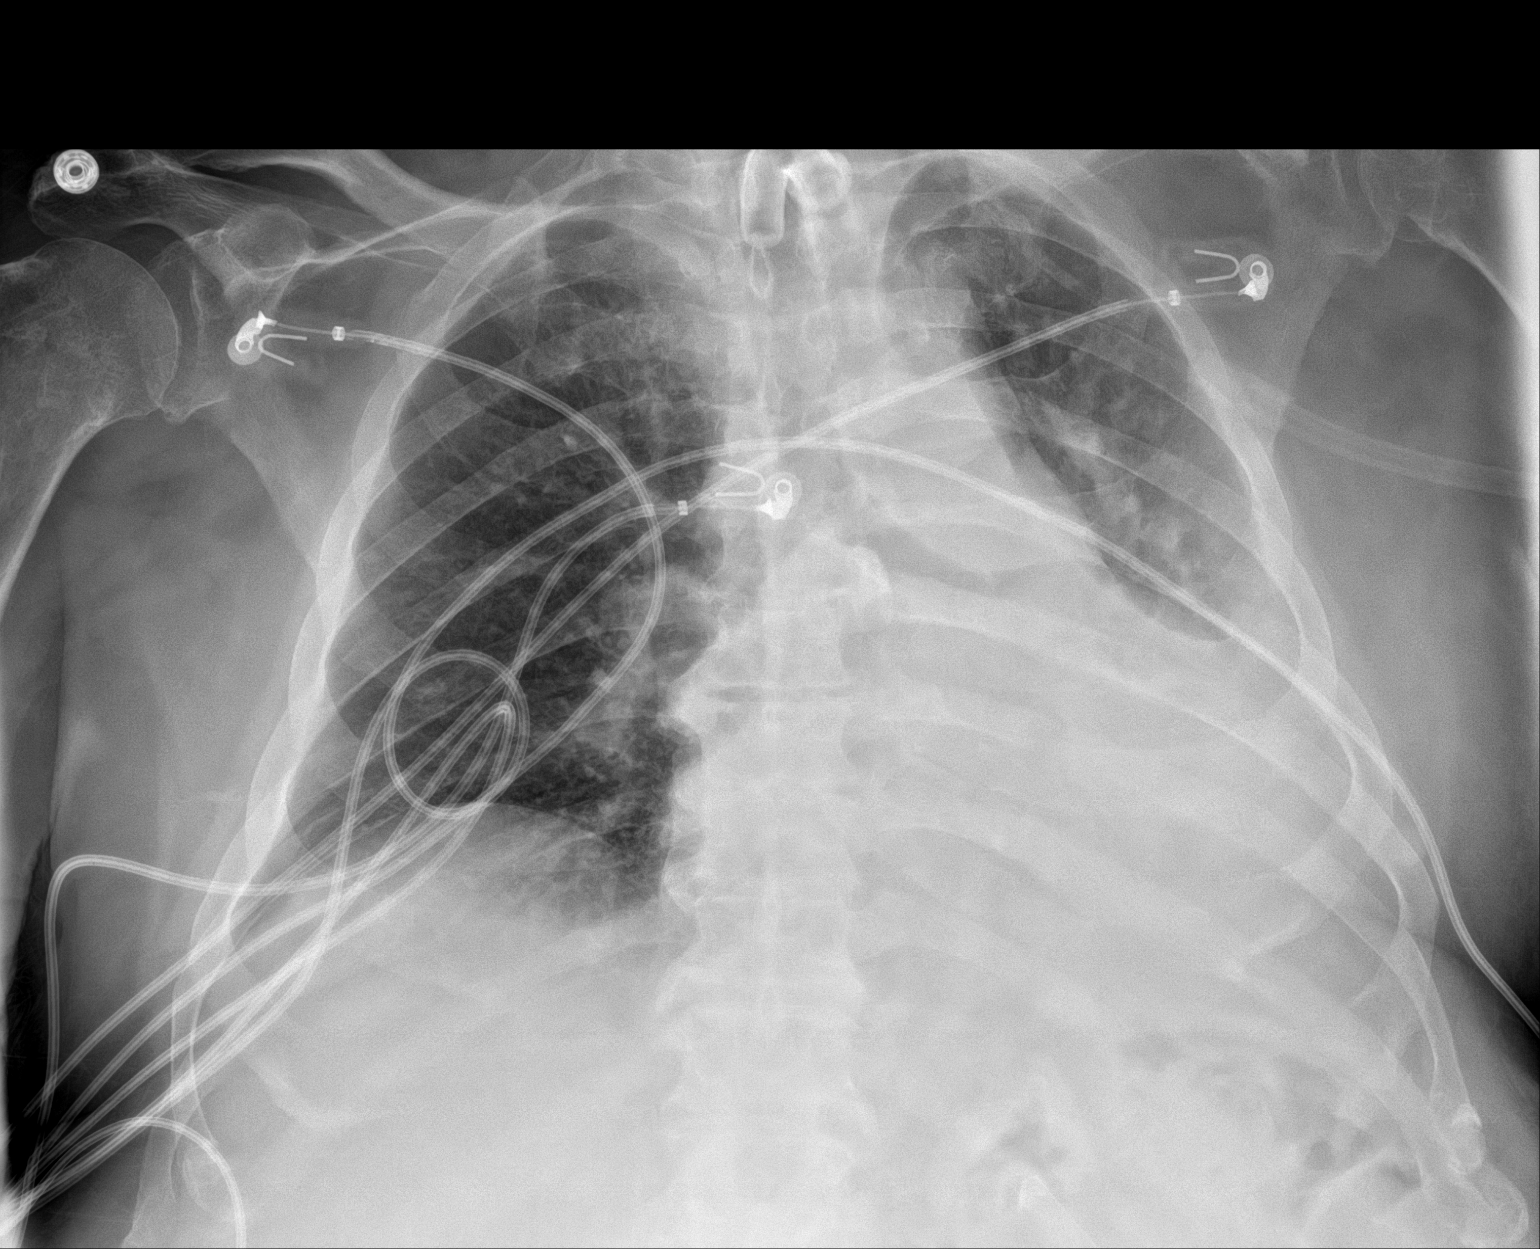

[1 of 1 positions shown; findings below may reference images not displayed]

FINDINGS: Tracheostomy tube is unchanged. Heart size is enlarged.
Consolidation of the left mid and lung base are noted. Previously
noted interstitial edema is improved. Bony structures are stable.
IMPRESSION: Improved interstitial edema.

Interval developed patchy consolidation of left mid and lung base
atelectasis with superimposed pneumonia is not excluded.

## 2020-08-29 DIAGNOSIS — J9691 Respiratory failure, unspecified with hypoxia: Secondary | ICD-10-CM | POA: Diagnosis not present

## 2020-08-29 DIAGNOSIS — N179 Acute kidney failure, unspecified: Secondary | ICD-10-CM | POA: Diagnosis not present

## 2020-08-29 DIAGNOSIS — J9621 Acute and chronic respiratory failure with hypoxia: Secondary | ICD-10-CM | POA: Diagnosis not present

## 2020-08-29 DIAGNOSIS — J9622 Acute and chronic respiratory failure with hypercapnia: Secondary | ICD-10-CM | POA: Diagnosis not present

## 2020-08-29 DIAGNOSIS — S02651A Fracture of angle of right mandible, initial encounter for closed fracture: Secondary | ICD-10-CM | POA: Diagnosis not present

## 2020-08-29 DIAGNOSIS — J9692 Respiratory failure, unspecified with hypercapnia: Secondary | ICD-10-CM | POA: Diagnosis not present

## 2020-08-29 LAB — BASIC METABOLIC PANEL
Anion gap: 6 (ref 5–15)
BUN: 55 mg/dL — ABNORMAL HIGH (ref 8–23)
CO2: 46 mmol/L — ABNORMAL HIGH (ref 22–32)
Calcium: 9.3 mg/dL (ref 8.9–10.3)
Chloride: 98 mmol/L (ref 98–111)
Creatinine, Ser: 0.79 mg/dL (ref 0.61–1.24)
GFR, Estimated: >60 mL/min (ref >60)
Glucose, Bld: 98 mg/dL (ref 70–99)
Potassium: 3.4 mmol/L — ABNORMAL LOW (ref 3.5–5.1)
Sodium: 150 mmol/L — ABNORMAL HIGH (ref 135–145)
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
## 2020-08-30 DIAGNOSIS — J9621 Acute and chronic respiratory failure with hypoxia: Secondary | ICD-10-CM | POA: Diagnosis not present

## 2020-08-30 DIAGNOSIS — I482 Chronic atrial fibrillation, unspecified: Secondary | ICD-10-CM | POA: Diagnosis not present

## 2020-08-30 DIAGNOSIS — I469 Cardiac arrest, cause unspecified: Secondary | ICD-10-CM | POA: Diagnosis not present

## 2020-08-30 DIAGNOSIS — N179 Acute kidney failure, unspecified: Secondary | ICD-10-CM | POA: Diagnosis not present

## 2020-08-30 DIAGNOSIS — J9691 Respiratory failure, unspecified with hypoxia: Secondary | ICD-10-CM | POA: Diagnosis not present

## 2020-08-30 DIAGNOSIS — S02651A Fracture of angle of right mandible, initial encounter for closed fracture: Secondary | ICD-10-CM | POA: Diagnosis not present

## 2020-08-30 DIAGNOSIS — J9692 Respiratory failure, unspecified with hypercapnia: Secondary | ICD-10-CM | POA: Diagnosis not present

## 2020-08-30 NOTE — Progress Notes (Signed)
Pulmonary [name]  PROGRESS NOTE     DELVON CHIPPS  OEH:[id number]  DOB: 1940/08/29   DOA: 07/08/2020  Referring Physician: Merton Border, MD  HPI: Daniel [name] is a 80 y.o. male seen for follow up of Acute on Chronic Respiratory Failure.  Patient currently is capping on 1 L nasal cannula.  The concern here is with excessive secretions.  Medications: Reviewed on Rounds  Physical Exam:  Vitals: Temperature 97.2 pulse 82 respiratory rate 17 blood pressure 112/67 saturations 96%  Ventilator Settings capping off the ventilator at this time  . General: Comfortable at this time . Eyes: Grossly normal lids, irises & conjunctiva . ENT: grossly tongue is normal . Neck: no obvious mass . Cardiovascular: S1 S2 normal no gallop . Respiratory: No rhonchi no rales are noted at this time . Abdomen: soft . Skin: no rash seen on limited exam . Musculoskeletal: not rigid . Psychiatric:unable to assess . Neurologic: no seizure no involuntary movements         Lab Data:   Basic Metabolic Panel: Recent Labs  Lab 08/27/20 0409 08/28/20 1040 08/29/20 0412  NA 150*  --  150*  K 3.0* 3.3* 3.4*  CL 95*  --  98  CO2 >50*  --  46*  GLUCOSE 127*  --  98  BUN 55*  --  55*  CREATININE 0.81  --  0.79  CALCIUM 9.2  --  9.3    ABG: Recent Labs  Lab 08/27/20 1001 08/27/20 1410  PHART 7.402 7.497*  PCO2ART 85.6* 65.4*  PO2ART 72.7* 79.5*  HCO3 52.2* 50.1*  O2SAT 95.0 97.1    Liver Function Tests: Recent Labs  Lab 08/27/20 0409  AST 84*  ALT 130*  ALKPHOS 100  BILITOT 0.8  PROT 7.1  ALBUMIN 2.3*   No results for input(s): LIPASE, AMYLASE in the last 168 hours. No results for input(s): AMMONIA in the last 168 hours.  CBC: Recent Labs  Lab 08/27/20 0409  WBC 10.0  HGB 9.5*  HCT 35.3*  MCV 100.3*  PLT 198    Cardiac Enzymes: No results for input(s): CKTOTAL, CKMB, CKMBINDEX,  TROPONINI in the last 168 hours.  BNP (last 3 results) Recent Labs    08/15/20 1100  BNP 1,187.3*    ProBNP (last 3 results) No results for input(s): PROBNP in the last 8760 hours.  Radiological Exams: No results found.  Assessment/Plan Active Problems:   Acute on chronic respiratory failure with hypoxia (HCC)   Healthcare-associated pneumonia   Cardiac arrest (HCC)   Chronic atrial fibrillation ([place])   1. Acute on chronic respiratory failure with hypoxia plan is to continue with capping trials as ordered.  Continue secretion management pulmonary toilet. 2. Healthcare associated pneumonia treated slow improvement 3. Cardiac arrest rhythm is stable 4. Chronic atrial fibrillation rate is controlled   I have personally seen and evaluated the patient, evaluated laboratory and imaging results, formulated the assessment and plan and placed orders. The Patient requires high complexity decision making with multiple systems involvement.  Rounds were done with the Respiratory Therapy Director and Staff therapists and discussed with nursing staff also.  [name], MD [facility] Pulmonary Critical Care Medicine Sleep Medicine

## 2020-08-31 DIAGNOSIS — I482 Chronic atrial fibrillation, unspecified: Secondary | ICD-10-CM | POA: Diagnosis not present

## 2020-08-31 DIAGNOSIS — S02651A Fracture of angle of right mandible, initial encounter for closed fracture: Secondary | ICD-10-CM | POA: Diagnosis not present

## 2020-08-31 DIAGNOSIS — N179 Acute kidney failure, unspecified: Secondary | ICD-10-CM | POA: Diagnosis not present

## 2020-08-31 DIAGNOSIS — I469 Cardiac arrest, cause unspecified: Secondary | ICD-10-CM | POA: Diagnosis not present

## 2020-08-31 DIAGNOSIS — J9691 Respiratory failure, unspecified with hypoxia: Secondary | ICD-10-CM | POA: Diagnosis not present

## 2020-08-31 DIAGNOSIS — J9621 Acute and chronic respiratory failure with hypoxia: Secondary | ICD-10-CM | POA: Diagnosis not present

## 2020-08-31 DIAGNOSIS — J9692 Respiratory failure, unspecified with hypercapnia: Secondary | ICD-10-CM | POA: Diagnosis not present

## 2020-08-31 LAB — CBC
HCT: 31.1 % — ABNORMAL LOW (ref 39.0–52.0)
Hemoglobin: 8.7 g/dL — ABNORMAL LOW (ref 13.0–17.0)
MCH: 26.4 pg (ref 26.0–34.0)
MCHC: 28 g/dL — ABNORMAL LOW (ref 30.0–36.0)
MCV: 94.2 fL (ref 80.0–100.0)
Platelets: 182 10*3/uL (ref 150–400)
RBC: 3.3 MIL/uL — ABNORMAL LOW (ref 4.22–5.81)
RDW: 17.2 % — ABNORMAL HIGH (ref 11.5–15.5)
WBC: 9.6 10*3/uL (ref 4.0–10.5)
nRBC: 0 % (ref 0.0–0.2)

## 2020-08-31 LAB — BASIC METABOLIC PANEL
Anion gap: 4 — ABNORMAL LOW (ref 5–15)
BUN: 46 mg/dL — ABNORMAL HIGH (ref 8–23)
CO2: 41 mmol/L — ABNORMAL HIGH (ref 22–32)
Calcium: 9 mg/dL (ref 8.9–10.3)
Chloride: 98 mmol/L (ref 98–111)
Creatinine, Ser: 0.81 mg/dL (ref 0.61–1.24)
GFR, Estimated: >60 mL/min (ref >60)
Glucose, Bld: 157 mg/dL — ABNORMAL HIGH (ref 70–99)
Potassium: 3 mmol/L — ABNORMAL LOW (ref 3.5–5.1)
Sodium: 143 mmol/L (ref 135–145)

## 2020-08-31 NOTE — Progress Notes (Signed)
Pulmonary [place]  PROGRESS NOTE     ALEE KATEN  QMV:[id number]  DOB: 10-28-1940   DOA: 07/08/2020  Referring Physician: Merton Border, MD  HPI: JAHKARI MACLIN is a 80 y.o. male seen for follow up of Acute on Chronic Respiratory Failure.  Patient is capping during the daytime has been doing okay using the BiPAP at nighttime  Medications: Reviewed on Rounds  Physical Exam:  Vitals: Temperature is 98.0 pulse 92 respiratory rate is 21 blood pressure is 113/60 saturations 99%  Ventilator Settings capping off the ventilator at this time  . General: Comfortable at this time . Eyes: Grossly normal lids, irises & conjunctiva . ENT: grossly tongue is normal . Neck: no obvious mass . Cardiovascular: S1 S2 normal no gallop . Respiratory: Scattered rhonchi expansion is equal . Abdomen: soft . Skin: no rash seen on limited exam . Musculoskeletal: not rigid . Psychiatric:unable to assess . Neurologic: no seizure no involuntary movements         Lab Data:   Basic Metabolic Panel: Recent Labs  Lab 08/27/20 0409 08/28/20 1040 08/29/20 0412 08/31/20 0441  NA 150*  --  150* 143  K 3.0* 3.3* 3.4* 3.0*  CL 95*  --  98 98  CO2 >50*  --  46* 41*  GLUCOSE 127*  --  98 157*  BUN 55*  --  55* 46*  CREATININE 0.81  --  0.79 0.81  CALCIUM 9.2  --  9.3 9.0    ABG: Recent Labs  Lab 08/27/20 1001 08/27/20 1410  PHART 7.402 7.497*  PCO2ART 85.6* 65.4*  PO2ART 72.7* 79.5*  HCO3 52.2* 50.1*  O2SAT 95.0 97.1    Liver Function Tests: Recent Labs  Lab 08/27/20 0409  AST 84*  ALT 130*  ALKPHOS 100  BILITOT 0.8  PROT 7.1  ALBUMIN 2.3*   No results for input(s): LIPASE, AMYLASE in the last 168 hours. No results for input(s): AMMONIA in the last 168 hours.  CBC: Recent Labs  Lab 08/27/20 0409 08/31/20 0441  WBC 10.0 9.6  HGB 9.5* 8.7*  HCT 35.3* 31.1*  MCV 100.3* 94.2  PLT 198 182     Cardiac Enzymes: No results for input(s): CKTOTAL, CKMB, CKMBINDEX, TROPONINI in the last 168 hours.  BNP (last 3 results) Recent Labs    08/15/20 1100  BNP 1,187.3*    ProBNP (last 3 results) No results for input(s): PROBNP in the last 8760 hours.  Radiological Exams: No results found.  Assessment/Plan Active Problems:   Acute on chronic respiratory failure with hypoxia (HCC)   Healthcare-associated pneumonia   Cardiac arrest (HCC)   Chronic atrial fibrillation ([place])   1. Acute on chronic respiratory failure hypoxia patient continues with capping trials patient right now is on BiPAP at nighttime. 2. Healthcare associated pneumonia treated slow improvement 3. Cardiac arrest rhythm is stable 4. Chronic atrial fibrillation rate is controlled at this time   I have personally seen and evaluated the patient, evaluated laboratory and imaging results, formulated the assessment and plan and placed orders. The Patient requires high complexity decision making with multiple systems involvement.  Rounds were done with the Respiratory Therapy Director and Staff therapists and discussed with nursing staff also.  [name], MD [facility] Pulmonary Critical Care Medicine Sleep Medicine

## 2020-09-01 DIAGNOSIS — J9691 Respiratory failure, unspecified with hypoxia: Secondary | ICD-10-CM | POA: Diagnosis not present

## 2020-09-01 DIAGNOSIS — N179 Acute kidney failure, unspecified: Secondary | ICD-10-CM | POA: Diagnosis not present

## 2020-09-01 DIAGNOSIS — J9621 Acute and chronic respiratory failure with hypoxia: Secondary | ICD-10-CM | POA: Diagnosis not present

## 2020-09-01 DIAGNOSIS — J9692 Respiratory failure, unspecified with hypercapnia: Secondary | ICD-10-CM | POA: Diagnosis not present

## 2020-09-01 DIAGNOSIS — I469 Cardiac arrest, cause unspecified: Secondary | ICD-10-CM | POA: Diagnosis not present

## 2020-09-01 DIAGNOSIS — S02651A Fracture of angle of right mandible, initial encounter for closed fracture: Secondary | ICD-10-CM | POA: Diagnosis not present

## 2020-09-01 DIAGNOSIS — I482 Chronic atrial fibrillation, unspecified: Secondary | ICD-10-CM | POA: Diagnosis not present

## 2020-09-01 NOTE — Progress Notes (Signed)
Pulmonary [place]  PROGRESS NOTE     Daniel [name]  HOZ:[id number]  DOB: [birth year]   DOA: 07/08/2020  Referring Physician: Merton Border, MD  HPI: Daniel [name] is a 80 y.o. male seen for follow up of Acute on Chronic Respiratory Failure.  Patient is capping right now on room air without any distress.  Medications: Reviewed on Rounds  Physical Exam:  Vitals: Temperature is 98.2 pulse 89 respiratory rate is 19 blood pressure is 117/51  Ventilator Settings capping off the ventilator on room air  . General: Comfortable at this time . Eyes: Grossly normal lids, irises & conjunctiva . ENT: grossly tongue is normal . Neck: no obvious mass . Cardiovascular: S1 S2 normal no gallop . Respiratory: No rhonchi no rales are noted at this time . Abdomen: soft . Skin: no rash seen on limited exam . Musculoskeletal: not rigid . Psychiatric:unable to assess . Neurologic: no seizure no involuntary movements         Lab Data:   Basic Metabolic Panel: Recent Labs  Lab 08/27/20 0409 08/28/20 1040 08/29/20 0412 08/31/20 0441  NA 150*  --  150* 143  K 3.0* 3.3* 3.4* 3.0*  CL 95*  --  98 98  CO2 >50*  --  46* 41*  GLUCOSE 127*  --  98 157*  BUN 55*  --  55* 46*  CREATININE 0.81  --  0.79 0.81  CALCIUM 9.2  --  9.3 9.0    ABG: Recent Labs  Lab 08/27/20 1001 08/27/20 1410  PHART 7.402 7.497*  PCO2ART 85.6* 65.4*  PO2ART 72.7* 79.5*  HCO3 52.2* 50.1*  O2SAT 95.0 97.1    Liver Function Tests: Recent Labs  Lab 08/27/20 0409  AST 84*  ALT 130*  ALKPHOS 100  BILITOT 0.8  PROT 7.1  ALBUMIN 2.3*   No results for input(s): LIPASE, AMYLASE in the last 168 hours. No results for input(s): AMMONIA in the last 168 hours.  CBC: Recent Labs  Lab 08/27/20 0409 08/31/20 0441  WBC 10.0 9.6  HGB 9.5* 8.7*  HCT 35.3* 31.1*  MCV 100.3* 94.2  PLT 198 182    Cardiac Enzymes: No results  for input(s): CKTOTAL, CKMB, CKMBINDEX, TROPONINI in the last 168 hours.  BNP (last 3 results) Recent Labs    08/15/20 1100  BNP 1,187.3*    ProBNP (last 3 results) No results for input(s): PROBNP in the last 8760 hours.  Radiological Exams: No results found.  Assessment/Plan Active Problems:   Acute on chronic respiratory failure with hypoxia (HCC)   Healthcare-associated pneumonia   Cardiac arrest (HCC)   Chronic atrial fibrillation ([place])   1. Acute on chronic respiratory failure hypoxia plan is going to be to continue with the capping right now secretions remain limiting factor.  We will continue to follow along continue secretion management 2. Healthcare associated pneumonia treated slow improvement 3. Cardiac arrest rhythm stable 4. Chronic atrial fibrillation rate is controlled   I have personally seen and evaluated the patient, evaluated laboratory and imaging results, formulated the assessment and plan and placed orders. The Patient requires high complexity decision making with multiple systems involvement.  Rounds were done with the Respiratory Therapy Director and Staff therapists and discussed with nursing staff also.  [name], MD [facility] Pulmonary Critical Care Medicine Sleep Medicine

## 2020-09-02 DIAGNOSIS — J9691 Respiratory failure, unspecified with hypoxia: Secondary | ICD-10-CM | POA: Diagnosis not present

## 2020-09-02 DIAGNOSIS — L89893 Pressure ulcer of other site, stage 3: Secondary | ICD-10-CM | POA: Diagnosis not present

## 2020-09-02 DIAGNOSIS — I469 Cardiac arrest, cause unspecified: Secondary | ICD-10-CM | POA: Diagnosis not present

## 2020-09-02 DIAGNOSIS — I482 Chronic atrial fibrillation, unspecified: Secondary | ICD-10-CM | POA: Diagnosis not present

## 2020-09-02 DIAGNOSIS — G0490 Encephalitis and encephalomyelitis, unspecified: Secondary | ICD-10-CM | POA: Diagnosis not present

## 2020-09-02 DIAGNOSIS — Z9911 Dependence on respirator [ventilator] status: Secondary | ICD-10-CM | POA: Diagnosis not present

## 2020-09-02 DIAGNOSIS — N179 Acute kidney failure, unspecified: Secondary | ICD-10-CM | POA: Diagnosis not present

## 2020-09-02 DIAGNOSIS — J9692 Respiratory failure, unspecified with hypercapnia: Secondary | ICD-10-CM | POA: Diagnosis not present

## 2020-09-02 DIAGNOSIS — S02651A Fracture of angle of right mandible, initial encounter for closed fracture: Secondary | ICD-10-CM | POA: Diagnosis not present

## 2020-09-02 DIAGNOSIS — J9621 Acute and chronic respiratory failure with hypoxia: Secondary | ICD-10-CM | POA: Diagnosis not present

## 2020-09-02 LAB — BASIC METABOLIC PANEL
Anion gap: 5 (ref 5–15)
BUN: 46 mg/dL — ABNORMAL HIGH (ref 8–23)
CO2: 34 mmol/L — ABNORMAL HIGH (ref 22–32)
Calcium: 9 mg/dL (ref 8.9–10.3)
Chloride: 106 mmol/L (ref 98–111)
Creatinine, Ser: 0.75 mg/dL (ref 0.61–1.24)
GFR, Estimated: >60 mL/min (ref >60)
Glucose, Bld: 126 mg/dL — ABNORMAL HIGH (ref 70–99)
Potassium: 3.3 mmol/L — ABNORMAL LOW (ref 3.5–5.1)
Sodium: 145 mmol/L (ref 135–145)

## 2020-09-02 LAB — MAGNESIUM: Magnesium: 2.3 mg/dL (ref 1.7–2.4)

## 2020-09-02 NOTE — Progress Notes (Signed)
Pulmonary [place]  PROGRESS NOTE     MAVERIC DEBONO  OEV:[id number]  DOB: 06/27/1940   DOA: 07/08/2020  Referring Physician: Merton Border, MD  HPI: Daniel [name] is a 80 y.o. male seen for follow up of Acute on Chronic Respiratory Failure.  Patient is capping right now on room air likely will be discharging to nursing home because of the tracheostomy care needs.  Medications: Reviewed on Rounds  Physical Exam:  Vitals: Temperature is 98.0 pulse 86 respiratory rate is 22 blood pressure is 124/73 saturations 100%  Ventilator Settings capping right now on room air  . General: Comfortable at this time . Eyes: Grossly normal lids, irises & conjunctiva . ENT: grossly tongue is normal . Neck: no obvious mass . Cardiovascular: S1 S2 normal no gallop . Respiratory: No rhonchi no rales are noted at this time . Abdomen: soft . Skin: no rash seen on limited exam . Musculoskeletal: not rigid . Psychiatric:unable to assess . Neurologic: no seizure no involuntary movements         Lab Data:   Basic Metabolic Panel: Recent Labs  Lab 08/27/20 0409 08/28/20 1040 08/29/20 0412 08/31/20 0441 09/02/20 0444  NA 150*  --  150* 143 145  K 3.0* 3.3* 3.4* 3.0* 3.3*  CL 95*  --  98 98 106  CO2 >50*  --  46* 41* 34*  GLUCOSE 127*  --  98 157* 126*  BUN 55*  --  55* 46* 46*  CREATININE 0.81  --  0.79 0.81 0.75  CALCIUM 9.2  --  9.3 9.0 9.0  MG  --   --   --   --  2.3    ABG: Recent Labs  Lab 08/27/20 1001 08/27/20 1410  PHART 7.402 7.497*  PCO2ART 85.6* 65.4*  PO2ART 72.7* 79.5*  HCO3 52.2* 50.1*  O2SAT 95.0 97.1    Liver Function Tests: Recent Labs  Lab 08/27/20 0409  AST 84*  ALT 130*  ALKPHOS 100  BILITOT 0.8  PROT 7.1  ALBUMIN 2.3*   No results for input(s): LIPASE, AMYLASE in the last 168 hours. No results for input(s): AMMONIA in the last 168 hours.  CBC: Recent Labs  Lab  08/27/20 0409 08/31/20 0441  WBC 10.0 9.6  HGB 9.5* 8.7*  HCT 35.3* 31.1*  MCV 100.3* 94.2  PLT 198 182    Cardiac Enzymes: No results for input(s): CKTOTAL, CKMB, CKMBINDEX, TROPONINI in the last 168 hours.  BNP (last 3 results) Recent Labs    08/15/20 1100  BNP 1,187.3*    ProBNP (last 3 results) No results for input(s): PROBNP in the last 8760 hours.  Radiological Exams: No results found.  Assessment/Plan Active Problems:   Acute on chronic respiratory failure with hypoxia (HCC)   Healthcare-associated pneumonia   Cardiac arrest (HCC)   Chronic atrial fibrillation ([place])   1. Acute on chronic respiratory failure hypoxia plan is to continue with capping trials on room air as ordered continue secretion management supportive care. 2. Healthcare associated pneumonia treated slow improvement 3. Cardiac arrest rhythm is stable we will continue to monitor 4. Chronic atrial fibrillation rate is controlled   I have personally seen and evaluated the patient, evaluated laboratory and imaging results, formulated the assessment and plan and placed orders. The Patient requires high complexity decision making with multiple systems involvement.  Rounds were done with the Respiratory Therapy Director and Staff therapists and discussed with  nursing staff also.  [name], MD [facility] Pulmonary Critical Care Medicine Sleep Medicine

## 2020-09-03 DIAGNOSIS — J9621 Acute and chronic respiratory failure with hypoxia: Secondary | ICD-10-CM | POA: Diagnosis not present

## 2020-09-03 DIAGNOSIS — Z930 Tracheostomy status: Secondary | ICD-10-CM | POA: Diagnosis not present

## 2020-09-03 DIAGNOSIS — I469 Cardiac arrest, cause unspecified: Secondary | ICD-10-CM | POA: Diagnosis not present

## 2020-09-03 DIAGNOSIS — J9692 Respiratory failure, unspecified with hypercapnia: Secondary | ICD-10-CM | POA: Diagnosis not present

## 2020-09-03 DIAGNOSIS — I482 Chronic atrial fibrillation, unspecified: Secondary | ICD-10-CM | POA: Diagnosis not present

## 2020-09-03 DIAGNOSIS — N179 Acute kidney failure, unspecified: Secondary | ICD-10-CM | POA: Diagnosis not present

## 2020-09-03 DIAGNOSIS — J9691 Respiratory failure, unspecified with hypoxia: Secondary | ICD-10-CM | POA: Diagnosis not present

## 2020-09-03 DIAGNOSIS — I739 Peripheral vascular disease, unspecified: Secondary | ICD-10-CM | POA: Diagnosis not present

## 2020-09-03 DIAGNOSIS — S02651A Fracture of angle of right mandible, initial encounter for closed fracture: Secondary | ICD-10-CM | POA: Diagnosis not present

## 2020-09-03 LAB — POTASSIUM: Potassium: 3.5 mmol/L (ref 3.5–5.1)

## 2020-09-03 NOTE — Progress Notes (Signed)
Pulmonary [place]  PROGRESS NOTE     Daniel [name]  CVE:[id number]  DOB: 1941-01-12   DOA: 07/08/2020  Referring Physician: Merton Border, MD  HPI: Daniel [name] is a 80 y.o. male seen for follow up of Acute on Chronic Respiratory Failure.  Patient is capping on room air possible discharge scheduled for Monday  Medications: Reviewed on Rounds  Physical Exam:  Vitals: Temperature is 98.8 pulse 95 respiratory 20 blood pressure is 134/74 saturations 94%  Ventilator Settings capping off the ventilator on room air  . General: Comfortable at this time . Eyes: Grossly normal lids, irises & conjunctiva . ENT: grossly tongue is normal . Neck: no obvious mass . Cardiovascular: S1 S2 normal no gallop . Respiratory: No rhonchi very coarse breath sounds . Abdomen: soft . Skin: no rash seen on limited exam . Musculoskeletal: not rigid . Psychiatric:unable to assess . Neurologic: no seizure no involuntary movements         Lab Data:   Basic Metabolic Panel: Recent Labs  Lab 08/28/20 1040 08/29/20 0412 08/31/20 0441 09/02/20 0444 09/03/20 0707  NA  --  150* 143 145  --   K 3.3* 3.4* 3.0* 3.3* 3.5  CL  --  98 98 106  --   CO2  --  46* 41* 34*  --   GLUCOSE  --  98 157* 126*  --   BUN  --  55* 46* 46*  --   CREATININE  --  0.79 0.81 0.75  --   CALCIUM  --  9.3 9.0 9.0  --   MG  --   --   --  2.3  --     ABG: No results for input(s): PHART, PCO2ART, PO2ART, HCO3, O2SAT in the last 168 hours.  Liver Function Tests: No results for input(s): AST, ALT, ALKPHOS, BILITOT, PROT, ALBUMIN in the last 168 hours. No results for input(s): LIPASE, AMYLASE in the last 168 hours. No results for input(s): AMMONIA in the last 168 hours.  CBC: Recent Labs  Lab 08/31/20 0441  WBC 9.6  HGB 8.7*  HCT 31.1*  MCV 94.2  PLT 182    Cardiac Enzymes: No results for input(s): CKTOTAL, CKMB, CKMBINDEX,  TROPONINI in the last 168 hours.  BNP (last 3 results) Recent Labs    08/15/20 1100  BNP 1,187.3*    ProBNP (last 3 results) No results for input(s): PROBNP in the last 8760 hours.  Radiological Exams: No results found.  Assessment/Plan Active Problems:   Acute on chronic respiratory failure with hypoxia (HCC)   Healthcare-associated pneumonia   Cardiac arrest (HCC)   Chronic atrial fibrillation ([place])   1. Acute on chronic respiratory failure hypoxia plan is to continue with capping trials continue secretion management supportive care. 2. Healthcare associated pneumonia treated slow improvement 3. Cardiac arrest rhythm stable 4. Chronic atrial fibrillation rate controlled we will continue to follow along   I have personally seen and evaluated the patient, evaluated laboratory and imaging results, formulated the assessment and plan and placed orders. The Patient requires high complexity decision making with multiple systems involvement.  Rounds were done with the Respiratory Therapy Director and Staff therapists and discussed with nursing staff also.  [name], MD [facility] Pulmonary Critical Care Medicine Sleep Medicine

## 2020-09-04 DIAGNOSIS — I482 Chronic atrial fibrillation, unspecified: Secondary | ICD-10-CM | POA: Diagnosis not present

## 2020-09-04 DIAGNOSIS — J9621 Acute and chronic respiratory failure with hypoxia: Secondary | ICD-10-CM | POA: Diagnosis not present

## 2020-09-04 DIAGNOSIS — I469 Cardiac arrest, cause unspecified: Secondary | ICD-10-CM | POA: Diagnosis not present

## 2020-09-04 NOTE — Progress Notes (Signed)
Pulmonary [place]  PROGRESS NOTE     Daniel [name]  [id number]  DOB: 1940-05-19   DOA: 07/08/2020  Referring Physician: Merton Border, MD  HPI: Daniel [name] is a 80 y.o. male seen for follow up of Acute on Chronic Respiratory Failure.  Capping right now on room air without any distress  Medications: Reviewed on Rounds  Physical Exam:  Vitals: Temperature is 96.0 pulse 89 respiratory rate 30 blood pressure is 141/75  Ventilator Settings capping on room air right now  . General: Comfortable at this time . Eyes: Grossly normal lids, irises & conjunctiva . ENT: grossly tongue is normal . Neck: no obvious mass . Cardiovascular: S1 S2 normal no gallop . Respiratory: No rhonchi no rales are noted at this time . Abdomen: soft . Skin: no rash seen on limited exam . Musculoskeletal: not rigid . Psychiatric:unable to assess . Neurologic: no seizure no involuntary movements         Lab Data:   Basic Metabolic Panel: Recent Labs  Lab 08/29/20 0412 08/31/20 0441 09/02/20 0444 09/03/20 0707  NA 150* 143 145  --   K 3.4* 3.0* 3.3* 3.5  CL 98 98 106  --   CO2 46* 41* 34*  --   GLUCOSE 98 157* 126*  --   BUN 55* 46* 46*  --   CREATININE 0.79 0.81 0.75  --   CALCIUM 9.3 9.0 9.0  --   MG  --   --  2.3  --     ABG: No results for input(s): PHART, PCO2ART, PO2ART, HCO3, O2SAT in the last 168 hours.  Liver Function Tests: No results for input(s): AST, ALT, ALKPHOS, BILITOT, PROT, ALBUMIN in the last 168 hours. No results for input(s): LIPASE, AMYLASE in the last 168 hours. No results for input(s): AMMONIA in the last 168 hours.  CBC: Recent Labs  Lab 08/31/20 0441  WBC 9.6  HGB 8.7*  HCT 31.1*  MCV 94.2  PLT 182    Cardiac Enzymes: No results for input(s): CKTOTAL, CKMB, CKMBINDEX, TROPONINI in the last 168 hours.  BNP (last 3 results) Recent Labs    08/15/20 1100  BNP  1,187.3*    ProBNP (last 3 results) No results for input(s): PROBNP in the last 8760 hours.  Radiological Exams: No results found.  Assessment/Plan Active Problems:   Acute on chronic respiratory failure with hypoxia (HCC)   Healthcare-associated pneumonia   Cardiac arrest (HCC)   Chronic atrial fibrillation ([place])   1. Acute on chronic respiratory failure hypoxia plan is to proceed with capping trials as ordered. 2. Healthcare associated pneumonia treated slow improvement 3. Cardiac arrest rhythm is stable we will continue to follow 4. Chronic atrial fibrillation rate is controlled   I have personally seen and evaluated the patient, evaluated laboratory and imaging results, formulated the assessment and plan and placed orders. The Patient requires high complexity decision making with multiple systems involvement.  Rounds were done with the Respiratory Therapy Director and Staff therapists and discussed with nursing staff also.  [name], MD [facility] Pulmonary Critical Care Medicine Sleep Medicine

## 2020-09-05 DIAGNOSIS — G9340 Encephalopathy, unspecified: Secondary | ICD-10-CM | POA: Diagnosis not present

## 2020-09-05 DIAGNOSIS — J9691 Respiratory failure, unspecified with hypoxia: Secondary | ICD-10-CM | POA: Diagnosis not present

## 2020-09-05 DIAGNOSIS — N179 Acute kidney failure, unspecified: Secondary | ICD-10-CM | POA: Diagnosis not present

## 2020-09-05 DIAGNOSIS — J9692 Respiratory failure, unspecified with hypercapnia: Secondary | ICD-10-CM | POA: Diagnosis not present

## 2020-09-06 DIAGNOSIS — J449 Chronic obstructive pulmonary disease, unspecified: Secondary | ICD-10-CM | POA: Diagnosis not present

## 2020-09-06 DIAGNOSIS — R0689 Other abnormalities of breathing: Secondary | ICD-10-CM | POA: Diagnosis not present

## 2020-09-06 DIAGNOSIS — R69 Illness, unspecified: Secondary | ICD-10-CM | POA: Diagnosis not present

## 2020-09-06 DIAGNOSIS — N179 Acute kidney failure, unspecified: Secondary | ICD-10-CM | POA: Diagnosis not present

## 2020-09-06 DIAGNOSIS — Z743 Need for continuous supervision: Secondary | ICD-10-CM | POA: Diagnosis not present

## 2020-09-06 DIAGNOSIS — J9692 Respiratory failure, unspecified with hypercapnia: Secondary | ICD-10-CM | POA: Diagnosis not present

## 2020-09-06 DIAGNOSIS — J9610 Chronic respiratory failure, unspecified whether with hypoxia or hypercapnia: Secondary | ICD-10-CM | POA: Diagnosis not present

## 2020-09-06 DIAGNOSIS — J9691 Respiratory failure, unspecified with hypoxia: Secondary | ICD-10-CM | POA: Diagnosis not present

## 2020-09-06 DIAGNOSIS — Z930 Tracheostomy status: Secondary | ICD-10-CM | POA: Diagnosis not present

## 2020-09-06 DIAGNOSIS — G9340 Encephalopathy, unspecified: Secondary | ICD-10-CM | POA: Diagnosis not present

## 2020-09-06 DIAGNOSIS — R4789 Other speech disturbances: Secondary | ICD-10-CM | POA: Diagnosis not present

## 2020-09-06 LAB — SARS CORONAVIRUS 2 (TAT 6-24 HRS): SARS Coronavirus 2: NEGATIVE

## 2020-09-07 ENCOUNTER — Inpatient Hospital Stay
Admission: AD | Admit: 2020-09-07 | Discharge: 2020-09-30 | Disposition: A | Discharge status: Home Health | Payer: Medicare Other | Source: Other Acute Inpatient Hospital | Attending: Internal Medicine | Admitting: Internal Medicine

## 2020-09-07 ENCOUNTER — Encounter: Payer: Self-pay | Admitting: Internal Medicine

## 2020-09-07 DIAGNOSIS — J9692 Respiratory failure, unspecified with hypercapnia: Secondary | ICD-10-CM | POA: Diagnosis not present

## 2020-09-07 DIAGNOSIS — I469 Cardiac arrest, cause unspecified: Secondary | ICD-10-CM | POA: Diagnosis not present

## 2020-09-07 DIAGNOSIS — N179 Acute kidney failure, unspecified: Secondary | ICD-10-CM | POA: Diagnosis not present

## 2020-09-07 DIAGNOSIS — J9691 Respiratory failure, unspecified with hypoxia: Secondary | ICD-10-CM | POA: Diagnosis not present

## 2020-09-07 DIAGNOSIS — Z930 Tracheostomy status: Secondary | ICD-10-CM

## 2020-09-07 DIAGNOSIS — I482 Chronic atrial fibrillation, unspecified: Secondary | ICD-10-CM | POA: Diagnosis present

## 2020-09-07 DIAGNOSIS — J189 Pneumonia, unspecified organism: Secondary | ICD-10-CM

## 2020-09-07 DIAGNOSIS — J9612 Chronic respiratory failure with hypercapnia: Secondary | ICD-10-CM | POA: Diagnosis not present

## 2020-09-07 DIAGNOSIS — Z9911 Dependence on respirator [ventilator] status: Secondary | ICD-10-CM | POA: Diagnosis not present

## 2020-09-07 DIAGNOSIS — J9621 Acute and chronic respiratory failure with hypoxia: Secondary | ICD-10-CM | POA: Diagnosis present

## 2020-09-07 DIAGNOSIS — I4891 Unspecified atrial fibrillation: Secondary | ICD-10-CM | POA: Diagnosis not present

## 2020-09-07 HISTORY — DX: Tracheostomy status: Z93.0

## 2020-09-07 LAB — BLOOD GAS, ARTERIAL
Acid-Base Excess: 4.1 mmol/L — ABNORMAL HIGH (ref 0.0–2.0)
Bicarbonate: 28.3 mmol/L — ABNORMAL HIGH (ref 20.0–28.0)
FIO2: 21
O2 Saturation: 91.4 %
Patient temperature: 37
pCO2 arterial: 43.8 mmHg (ref 32.0–48.0)
pH, Arterial: 7.425 (ref 7.350–7.450)
pO2, Arterial: 62.9 mmHg — ABNORMAL LOW (ref 83.0–108.0)

## 2020-09-07 NOTE — Progress Notes (Signed)
Pulmonary Critical Care Medicine [place]  PROGRESS NOTE     RYNELL CIOTTI  [id number]:[id number]  DOB: 1940/05/17   DOA: 09/07/2020  Referring Physician: Merton Border, MD  HPI: TIMOTHEE GALI is a 80 y.o. male seen for follow up of Acute on Chronic Respiratory Failure.  Patient had been discharged yesterday to nursing home however was sent back because the patient was requiring BiPAP and also still have the tracheostomy.  The BiPAP was more last for sleep apnea some nocturnal ventilatory support however the nursing home was not able to take care of both items.  He has been capping now for several weeks and he is likely ready for decannulation we will observe him and then make that decision prior to his discharge  Medications: Reviewed on Rounds  Physical Exam:  Vitals: Temperature is 96.3 pulse 90 respiratory rate is 24 blood pressure 140/70 saturations 91%  Ventilator Settings currently is capping off the ventilator  . General: Comfortable at this time . Eyes: Grossly normal lids, irises & conjunctiva . ENT: grossly tongue is normal . Neck: no obvious mass . Cardiovascular: S1 S2 normal no gallop . Respiratory: No rhonchi no rales are noted at this time . Abdomen: soft . Skin: no rash seen on limited exam . Musculoskeletal: not rigid . Psychiatric:unable to assess . Neurologic: no seizure no involuntary movements         Lab Data:   Basic Metabolic Panel: Recent Labs  Lab 09/02/20 0444 09/03/20 0707  NA 145  --   K 3.3* 3.5  CL 106  --   CO2 34*  --   GLUCOSE 126*  --   BUN 46*  --   CREATININE 0.75  --   CALCIUM 9.0  --   MG 2.3  --     ABG: No results for input(s): PHART, PCO2ART, PO2ART, HCO3, O2SAT in the last 168 hours.  Liver Function Tests: No results for input(s): AST, ALT, ALKPHOS, BILITOT, PROT, ALBUMIN in the last 168 hours. No results for input(s): LIPASE, AMYLASE in the last 168 hours. No  results for input(s): AMMONIA in the last 168 hours.  CBC: No results for input(s): WBC, NEUTROABS, HGB, HCT, MCV, PLT in the last 168 hours.  Cardiac Enzymes: No results for input(s): CKTOTAL, CKMB, CKMBINDEX, TROPONINI in the last 168 hours.  BNP (last 3 results) Recent Labs    08/15/20 1100  BNP 1,187.3*    ProBNP (last 3 results) No results for input(s): PROBNP in the last 8760 hours.  Radiological Exams: No results found.  Assessment/Plan Active Problems:   Acute on chronic respiratory failure with hypoxia (HCC)   Cardiac arrest (HCC)   Chronic atrial fibrillation ([place])   Tracheostomy status ([place])   1. Acute on chronic respiratory failure with hypoxia the plan is going to be to continue with the BiPAP at nighttime while patient is asleep and we will work towards decannulation 2. Chronic atrial fibrillation rate is well controlled we will continue to monitor 3. Cardiac arrest rhythm has been stable no further events 4. Tracheostomy he is doing well with the capping we will work towards decannulation   I have personally seen and evaluated the patient, evaluated laboratory and imaging results, formulated the assessment and plan and placed orders. The Patient requires high complexity decision making with multiple systems involvement.  Rounds were done with the Respiratory Therapy Director and Staff therapists and discussed with nursing staff also.  [name], MD [facility] Pulmonary Critical Care Medicine Sleep Medicine

## 2020-09-08 ENCOUNTER — Other Ambulatory Visit (HOSPITAL_COMMUNITY): Payer: Self-pay

## 2020-09-08 DIAGNOSIS — I469 Cardiac arrest, cause unspecified: Secondary | ICD-10-CM | POA: Diagnosis not present

## 2020-09-08 DIAGNOSIS — J9621 Acute and chronic respiratory failure with hypoxia: Secondary | ICD-10-CM | POA: Diagnosis not present

## 2020-09-08 DIAGNOSIS — I482 Chronic atrial fibrillation, unspecified: Secondary | ICD-10-CM | POA: Diagnosis not present

## 2020-09-08 DIAGNOSIS — J9692 Respiratory failure, unspecified with hypercapnia: Secondary | ICD-10-CM | POA: Diagnosis not present

## 2020-09-08 DIAGNOSIS — R0602 Shortness of breath: Secondary | ICD-10-CM | POA: Diagnosis not present

## 2020-09-08 DIAGNOSIS — N179 Acute kidney failure, unspecified: Secondary | ICD-10-CM | POA: Diagnosis not present

## 2020-09-08 DIAGNOSIS — Z930 Tracheostomy status: Secondary | ICD-10-CM | POA: Diagnosis not present

## 2020-09-08 DIAGNOSIS — I4891 Unspecified atrial fibrillation: Secondary | ICD-10-CM | POA: Diagnosis not present

## 2020-09-08 DIAGNOSIS — J9691 Respiratory failure, unspecified with hypoxia: Secondary | ICD-10-CM | POA: Diagnosis not present

## 2020-09-08 LAB — CBC
HCT: 37.3 % — ABNORMAL LOW (ref 39.0–52.0)
Hemoglobin: 10.8 g/dL — ABNORMAL LOW (ref 13.0–17.0)
MCH: 26.4 pg (ref 26.0–34.0)
MCHC: 29 g/dL — ABNORMAL LOW (ref 30.0–36.0)
MCV: 91.2 fL (ref 80.0–100.0)
Platelets: 185 10*3/uL (ref 150–400)
RBC: 4.09 MIL/uL — ABNORMAL LOW (ref 4.22–5.81)
RDW: 16.8 % — ABNORMAL HIGH (ref 11.5–15.5)
WBC: 11 10*3/uL — ABNORMAL HIGH (ref 4.0–10.5)
nRBC: 0 % (ref 0.0–0.2)

## 2020-09-08 LAB — BASIC METABOLIC PANEL
Anion gap: 3 — ABNORMAL LOW (ref 5–15)
BUN: 32 mg/dL — ABNORMAL HIGH (ref 8–23)
CO2: 30 mmol/L (ref 22–32)
Calcium: 9 mg/dL (ref 8.9–10.3)
Chloride: 113 mmol/L — ABNORMAL HIGH (ref 98–111)
Creatinine, Ser: 0.72 mg/dL (ref 0.61–1.24)
GFR, Estimated: >60 mL/min (ref >60)
Glucose, Bld: 141 mg/dL — ABNORMAL HIGH (ref 70–99)
Potassium: 3.7 mmol/L (ref 3.5–5.1)
Sodium: 146 mmol/L — ABNORMAL HIGH (ref 135–145)

## 2020-09-08 IMAGING — DX DG CHEST 1V PORT
1 series · 1 of 1 positions shown · non-contrast
Comparison: [DATE]

CLINICAL DATA: Shortness of breath, pneumonia, tracheostomy present

EXAM:
PORTABLE CHEST 1 VIEW

[chest]
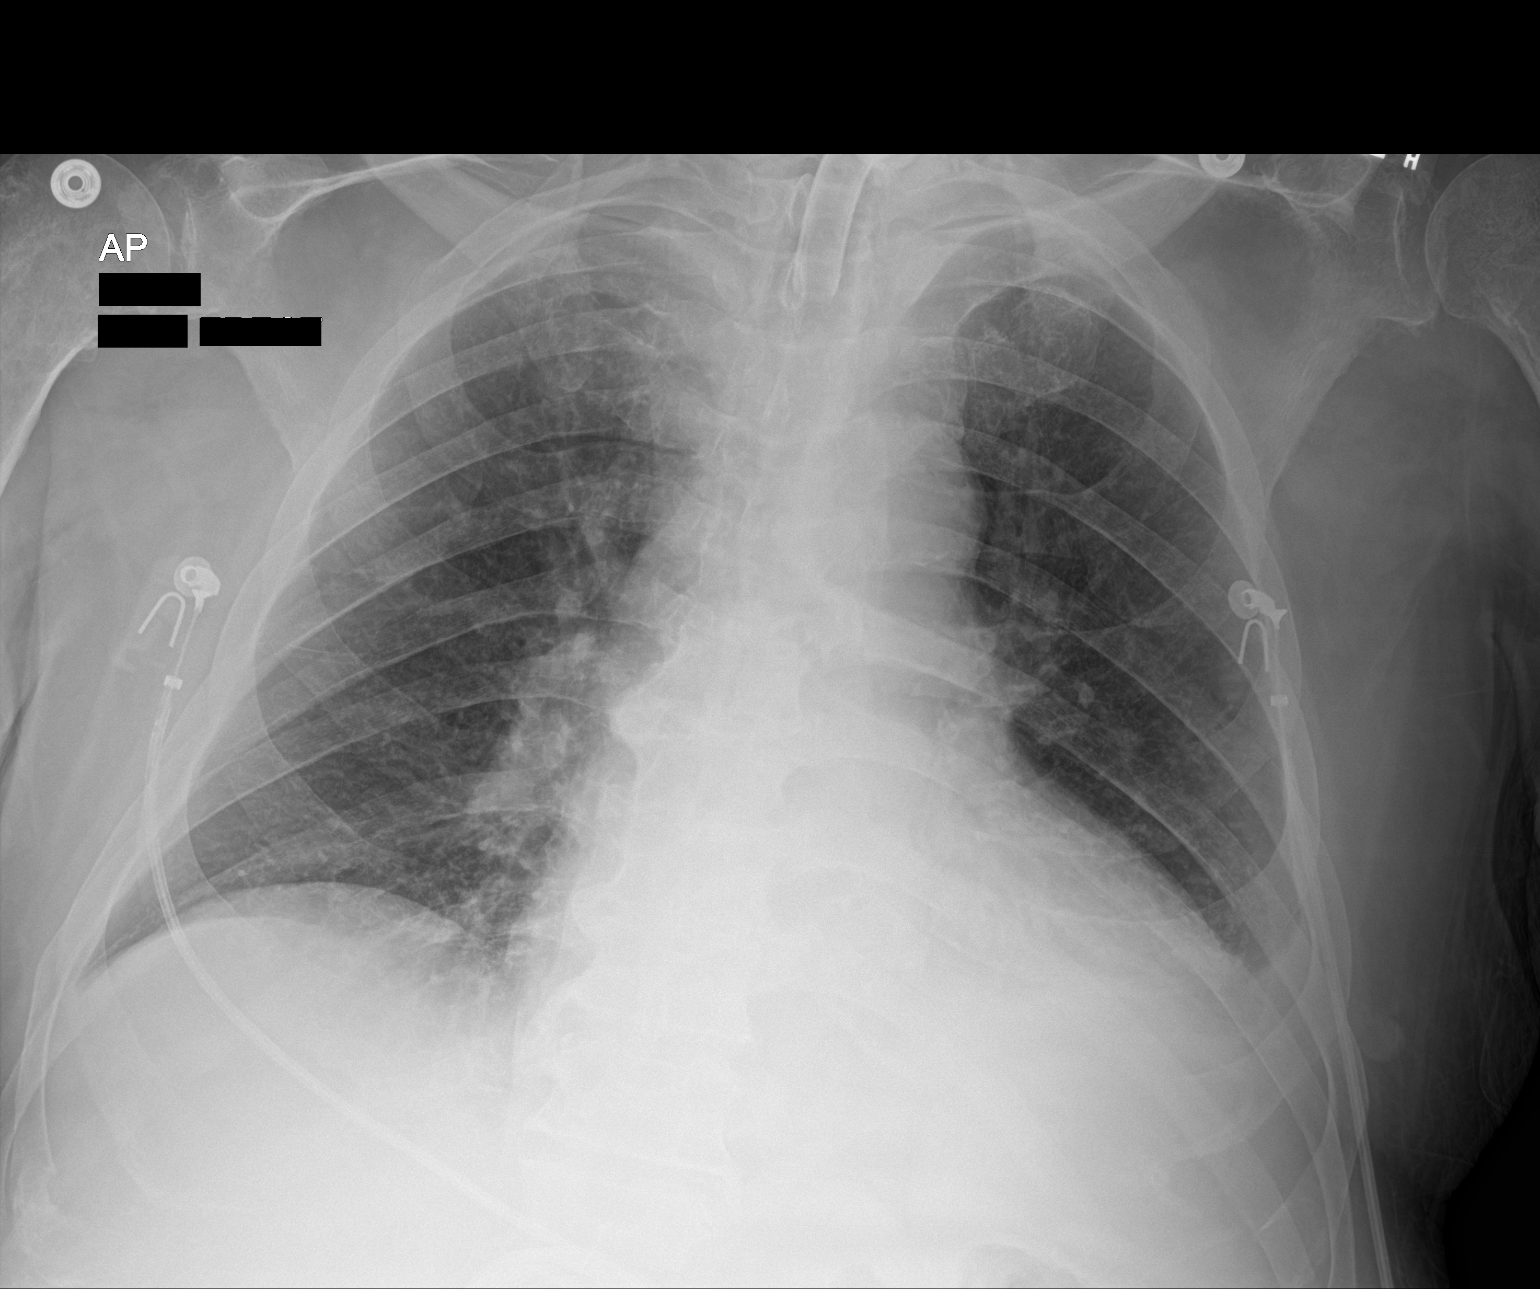

[1 of 1 positions shown; findings below may reference images not displayed]

FINDINGS: Tracheostomy device is present. Improved left basilar aeration with
persistent left basilar pleuroparenchymal opacity. No pneumothorax.
Stable cardiomediastinal contours.
IMPRESSION: Left basilar aeration is improved with persistent pleuroparenchymal
opacity.

## 2020-09-08 NOTE — Progress Notes (Signed)
Pulmonary Critical Care Medicine [place]  PROGRESS NOTE     JAYMISON LUBER  [id number]  DOB: 1940/07/25   DOA: 09/07/2020  Referring Physician: Merton Border, MD  HPI: Daniel [name] is a 80 y.o. male seen for follow up of Acute on Chronic Respiratory Failure.  Patient is capping doing fine.  Respiratory therapy reports a good strong cough right now is capping and has been using the BiPAP at nighttime for presumed sleep apnea  Medications: Reviewed on Rounds  Physical Exam:  Vitals: Temperature is 96.5 pulse 88 respiratory rate is 19 blood pressure 110/89 saturations 97%  Ventilator Settings capping off the ventilator.  . General: Comfortable at this time . Eyes: Grossly normal lids, irises & conjunctiva . ENT: grossly tongue is normal . Neck: no obvious mass . Cardiovascular: S1 S2 normal no gallop . Respiratory: Scattered rhonchi expansion is equal . Abdomen: soft . Skin: no rash seen on limited exam . Musculoskeletal: not rigid . Psychiatric:unable to assess . Neurologic: no seizure no involuntary movements         Lab Data:   Basic Metabolic Panel: Recent Labs  Lab 09/02/20 0444 09/03/20 0707 09/08/20 0620  NA 145  --  146*  K 3.3* 3.5 3.7  CL 106  --  113*  CO2 34*  --  30  GLUCOSE 126*  --  141*  BUN 46*  --  32*  CREATININE 0.75  --  0.72  CALCIUM 9.0  --  9.0  MG 2.3  --   --     ABG: Recent Labs  Lab 09/07/20 1410  PHART 7.425  PCO2ART 43.8  PO2ART 62.9*  HCO3 28.3*  O2SAT 91.4    Liver Function Tests: No results for input(s): AST, ALT, ALKPHOS, BILITOT, PROT, ALBUMIN in the last 168 hours. No results for input(s): LIPASE, AMYLASE in the last 168 hours. No results for input(s): AMMONIA in the last 168 hours.  CBC: Recent Labs  Lab 09/08/20 0620  WBC 11.0*  HGB 10.8*  HCT 37.3*  MCV 91.2  PLT 185    Cardiac Enzymes: No results for input(s): CKTOTAL, CKMB,  CKMBINDEX, TROPONINI in the last 168 hours.  BNP (last 3 results) Recent Labs    08/15/20 1100  BNP 1,187.3*    ProBNP (last 3 results) No results for input(s): PROBNP in the last 8760 hours.  Radiological Exams: No results found.  Assessment/Plan Active Problems:   Acute on chronic respiratory failure with hypoxia (HCC)   Cardiac arrest (HCC)   Chronic atrial fibrillation ([place])   Tracheostomy status ([place])   1. Acute on chronic respiratory failure hypoxia plan is to continue with BiPAP also recommended getting a follow-up chest x-ray today 2. Cardiac arrest rhythm stable we will continue to monitor 3. Chronic atrial fibrillation rate controlled 4. Tracheostomy remains in place   I have personally seen and evaluated the patient, evaluated laboratory and imaging results, formulated the assessment and plan and placed orders. The Patient requires high complexity decision making with multiple systems involvement.  Rounds were done with the Respiratory Therapy Director and Staff therapists and discussed with nursing staff also.  [name], MD [facility] Pulmonary Critical Care Medicine Sleep Medicine

## 2020-09-09 DIAGNOSIS — J9621 Acute and chronic respiratory failure with hypoxia: Secondary | ICD-10-CM | POA: Diagnosis not present

## 2020-09-09 DIAGNOSIS — I469 Cardiac arrest, cause unspecified: Secondary | ICD-10-CM | POA: Diagnosis not present

## 2020-09-09 DIAGNOSIS — I482 Chronic atrial fibrillation, unspecified: Secondary | ICD-10-CM | POA: Diagnosis not present

## 2020-09-09 DIAGNOSIS — N179 Acute kidney failure, unspecified: Secondary | ICD-10-CM | POA: Diagnosis not present

## 2020-09-09 DIAGNOSIS — Z930 Tracheostomy status: Secondary | ICD-10-CM | POA: Diagnosis not present

## 2020-09-09 DIAGNOSIS — I4891 Unspecified atrial fibrillation: Secondary | ICD-10-CM | POA: Diagnosis not present

## 2020-09-09 DIAGNOSIS — J9691 Respiratory failure, unspecified with hypoxia: Secondary | ICD-10-CM | POA: Diagnosis not present

## 2020-09-09 DIAGNOSIS — J9692 Respiratory failure, unspecified with hypercapnia: Secondary | ICD-10-CM | POA: Diagnosis not present

## 2020-09-09 NOTE — Progress Notes (Signed)
Pulmonary Critical Care Medicine [place]  PROGRESS NOTE     Daniel [name]  [id number]  DOB: 29-Aug-1940   DOA: 09/07/2020  Referring Physician: Merton Border, MD  HPI: Daniel [name] is a 80 y.o. male seen for follow up of Acute on Chronic Respiratory Failure.  Patient is capping right now on room air has been doing well.  Good strong cough is reported.  Secretions are minimal patient also has been using the BiPAP  Medications: Reviewed on Rounds  Physical Exam:  Vitals: Temperature is 96.6 pulse 80 respiratory rate 16 blood pressure is 110/69 saturations 98%  Ventilator Settings capping off the ventilator on room air  . General: Comfortable at this time . Eyes: Grossly normal lids, irises & conjunctiva . ENT: grossly tongue is normal . Neck: no obvious mass . Cardiovascular: S1 S2 normal no gallop . Respiratory: No rhonchi no rales are noted at this time . Abdomen: soft . Skin: no rash seen on limited exam . Musculoskeletal: not rigid . Psychiatric:unable to assess . Neurologic: no seizure no involuntary movements         Lab Data:   Basic Metabolic Panel: Recent Labs  Lab 09/03/20 0707 09/08/20 0620  NA  --  146*  K 3.5 3.7  CL  --  113*  CO2  --  30  GLUCOSE  --  141*  BUN  --  32*  CREATININE  --  0.72  CALCIUM  --  9.0    ABG: Recent Labs  Lab 09/07/20 1410  PHART 7.425  PCO2ART 43.8  PO2ART 62.9*  HCO3 28.3*  O2SAT 91.4    Liver Function Tests: No results for input(s): AST, ALT, ALKPHOS, BILITOT, PROT, ALBUMIN in the last 168 hours. No results for input(s): LIPASE, AMYLASE in the last 168 hours. No results for input(s): AMMONIA in the last 168 hours.  CBC: Recent Labs  Lab 09/08/20 0620  WBC 11.0*  HGB 10.8*  HCT 37.3*  MCV 91.2  PLT 185    Cardiac Enzymes: No results for input(s): CKTOTAL, CKMB, CKMBINDEX, TROPONINI in the last 168 hours.  BNP (last 3  results) Recent Labs    08/15/20 1100  BNP 1,187.3*    ProBNP (last 3 results) No results for input(s): PROBNP in the last 8760 hours.  Radiological Exams: DG Chest Port 1 View  Result Date: 09/08/2020 CLINICAL DATA:  Shortness of breath, pneumonia, tracheostomy present EXAM: PORTABLE CHEST 1 VIEW COMPARISON:  08/28/2020 FINDINGS: Tracheostomy device is present. Improved left basilar aeration with persistent left basilar pleuroparenchymal opacity. No pneumothorax. Stable cardiomediastinal contours. IMPRESSION: Left basilar aeration is improved with persistent pleuroparenchymal opacity. Electronically Signed   By: Macy Mis M.D.   On: 09/08/2020 08:53    Assessment/Plan Active Problems:   Acute on chronic respiratory failure with hypoxia [facility])   Cardiac arrest (HCC)   Chronic atrial fibrillation ([place])   Tracheostomy status ([place])   1. Acute on chronic respiratory failure hypoxia plan is to continue with capping proceed to decannulation 2. Cardiac arrest rhythm has been stable 3. Chronic atrial fibrillation rate is controlled 4. Tracheostomy has been doing extremely well with capping ready for decannulation   I have personally seen and evaluated the patient, evaluated laboratory and imaging results, formulated the assessment and plan and placed orders. The Patient requires high complexity decision making with multiple systems involvement.  Rounds were done with the Respiratory Therapy Director and Staff therapists and discussed  with nursing staff also.  [name], MD [facility] Pulmonary Critical Care Medicine Sleep Medicine

## 2020-09-10 DIAGNOSIS — J9621 Acute and chronic respiratory failure with hypoxia: Secondary | ICD-10-CM | POA: Diagnosis not present

## 2020-09-10 DIAGNOSIS — J9691 Respiratory failure, unspecified with hypoxia: Secondary | ICD-10-CM | POA: Diagnosis not present

## 2020-09-10 DIAGNOSIS — I469 Cardiac arrest, cause unspecified: Secondary | ICD-10-CM | POA: Diagnosis not present

## 2020-09-10 DIAGNOSIS — N179 Acute kidney failure, unspecified: Secondary | ICD-10-CM | POA: Diagnosis not present

## 2020-09-10 DIAGNOSIS — I4891 Unspecified atrial fibrillation: Secondary | ICD-10-CM | POA: Diagnosis not present

## 2020-09-10 DIAGNOSIS — Z930 Tracheostomy status: Secondary | ICD-10-CM | POA: Diagnosis not present

## 2020-09-10 DIAGNOSIS — I482 Chronic atrial fibrillation, unspecified: Secondary | ICD-10-CM | POA: Diagnosis not present

## 2020-09-10 DIAGNOSIS — J9692 Respiratory failure, unspecified with hypercapnia: Secondary | ICD-10-CM | POA: Diagnosis not present

## 2020-09-10 NOTE — Progress Notes (Signed)
Pulmonary Critical Care Medicine [place]  PROGRESS NOTE     [name]  [id number]:[id number]  DOB: [birth year]   DOA: 09/07/2020  Referring Physician: Merton Border, MD  HPI: Daniel [name] is a 80 y.o. male seen for follow up of Acute on Chronic Respiratory Failure.  Patient is capping right now has been on room air now comfortable without any distress  Medications: Reviewed on Rounds  Physical Exam:  Vitals: Temperature is 97.6 pulse 93 respiratory rate is 21 blood pressure is 91/50 saturations 98%  Ventilator Settings capping on room air  . General: Comfortable at this time . Eyes: Grossly normal lids, irises & conjunctiva . ENT: grossly tongue is normal . Neck: no obvious mass . Cardiovascular: S1 S2 normal no gallop . Respiratory: No rhonchi no rales noted at this time . Abdomen: soft . Skin: no rash seen on limited exam . Musculoskeletal: not rigid . Psychiatric:unable to assess . Neurologic: no seizure no involuntary movements         Lab Data:   Basic Metabolic Panel: Recent Labs  Lab 09/08/20 0620  NA 146*  K 3.7  CL 113*  CO2 30  GLUCOSE 141*  BUN 32*  CREATININE 0.72  CALCIUM 9.0    ABG: Recent Labs  Lab 09/07/20 1410  PHART 7.425  PCO2ART 43.8  PO2ART 62.9*  HCO3 28.3*  O2SAT 91.4    Liver Function Tests: No results for input(s): AST, ALT, ALKPHOS, BILITOT, PROT, ALBUMIN in the last 168 hours. No results for input(s): LIPASE, AMYLASE in the last 168 hours. No results for input(s): AMMONIA in the last 168 hours.  CBC: Recent Labs  Lab 09/08/20 0620  WBC 11.0*  HGB 10.8*  HCT 37.3*  MCV 91.2  PLT 185    Cardiac Enzymes: No results for input(s): CKTOTAL, CKMB, CKMBINDEX, TROPONINI in the last 168 hours.  BNP (last 3 results) Recent Labs    08/15/20 1100  BNP 1,187.3*    ProBNP (last 3 results) No results for input(s): PROBNP in the last 8760 hours.  Radiological  Exams: No results found.  Assessment/Plan Active Problems:   Acute on chronic respiratory failure with hypoxia (HCC)   Cardiac arrest (HCC)   Chronic atrial fibrillation ([place])   Tracheostomy status ([place])   1. Acute on chronic respiratory failure with hypoxia patient seems to be tolerating the capping trials very well.  There is no oxygen requirement.  I am now being told that the patient will possibly go home as his discharge plan according to the family. 2. Cardiac arrest rhythm is stable at this time we will monitor. 3. Chronic atrial fibrillation rate is controlled 4. Tracheostomy again doing fine with the Plan on decannulation early mid next week   I have personally seen and evaluated the patient, evaluated laboratory and imaging results, formulated the assessment and plan and placed orders. The Patient requires high complexity decision making with multiple systems involvement.  Rounds were done with the Respiratory Therapy Director and Staff therapists and discussed with nursing staff also.  [name], MD [facility] Pulmonary Critical Care Medicine Sleep Medicine

## 2020-09-11 DIAGNOSIS — J9692 Respiratory failure, unspecified with hypercapnia: Secondary | ICD-10-CM | POA: Diagnosis not present

## 2020-09-11 DIAGNOSIS — J9691 Respiratory failure, unspecified with hypoxia: Secondary | ICD-10-CM | POA: Diagnosis not present

## 2020-09-11 DIAGNOSIS — E039 Hypothyroidism, unspecified: Secondary | ICD-10-CM | POA: Diagnosis not present

## 2020-09-11 DIAGNOSIS — N179 Acute kidney failure, unspecified: Secondary | ICD-10-CM | POA: Diagnosis not present

## 2020-09-11 LAB — MAGNESIUM: Magnesium: 2.2 mg/dL (ref 1.7–2.4)

## 2020-09-11 LAB — CBC
HCT: 35.3 % — ABNORMAL LOW (ref 39.0–52.0)
Hemoglobin: 10 g/dL — ABNORMAL LOW (ref 13.0–17.0)
MCH: 26.2 pg (ref 26.0–34.0)
MCHC: 28.3 g/dL — ABNORMAL LOW (ref 30.0–36.0)
MCV: 92.4 fL (ref 80.0–100.0)
Platelets: 246 10*3/uL (ref 150–400)
RBC: 3.82 MIL/uL — ABNORMAL LOW (ref 4.22–5.81)
RDW: 16.9 % — ABNORMAL HIGH (ref 11.5–15.5)
WBC: 7.6 10*3/uL (ref 4.0–10.5)
nRBC: 0 % (ref 0.0–0.2)

## 2020-09-11 LAB — BASIC METABOLIC PANEL
Anion gap: 9 (ref 5–15)
BUN: 39 mg/dL — ABNORMAL HIGH (ref 8–23)
CO2: 27 mmol/L (ref 22–32)
Calcium: 9 mg/dL (ref 8.9–10.3)
Chloride: 110 mmol/L (ref 98–111)
Creatinine, Ser: 0.71 mg/dL (ref 0.61–1.24)
GFR, Estimated: >60 mL/min (ref >60)
Glucose, Bld: 100 mg/dL — ABNORMAL HIGH (ref 70–99)
Potassium: 3.8 mmol/L (ref 3.5–5.1)
Sodium: 146 mmol/L — ABNORMAL HIGH (ref 135–145)

## 2020-09-12 DIAGNOSIS — S02651A Fracture of angle of right mandible, initial encounter for closed fracture: Secondary | ICD-10-CM | POA: Diagnosis not present

## 2020-09-12 DIAGNOSIS — N179 Acute kidney failure, unspecified: Secondary | ICD-10-CM | POA: Diagnosis not present

## 2020-09-12 DIAGNOSIS — J9692 Respiratory failure, unspecified with hypercapnia: Secondary | ICD-10-CM | POA: Diagnosis not present

## 2020-09-12 DIAGNOSIS — J9691 Respiratory failure, unspecified with hypoxia: Secondary | ICD-10-CM | POA: Diagnosis not present

## 2020-09-13 DIAGNOSIS — N179 Acute kidney failure, unspecified: Secondary | ICD-10-CM | POA: Diagnosis not present

## 2020-09-13 DIAGNOSIS — J9692 Respiratory failure, unspecified with hypercapnia: Secondary | ICD-10-CM | POA: Diagnosis not present

## 2020-09-13 DIAGNOSIS — J9691 Respiratory failure, unspecified with hypoxia: Secondary | ICD-10-CM | POA: Diagnosis not present

## 2020-09-13 DIAGNOSIS — S02651A Fracture of angle of right mandible, initial encounter for closed fracture: Secondary | ICD-10-CM | POA: Diagnosis not present

## 2020-09-14 DIAGNOSIS — J9692 Respiratory failure, unspecified with hypercapnia: Secondary | ICD-10-CM | POA: Diagnosis not present

## 2020-09-14 DIAGNOSIS — N179 Acute kidney failure, unspecified: Secondary | ICD-10-CM | POA: Diagnosis not present

## 2020-09-14 DIAGNOSIS — S02651A Fracture of angle of right mandible, initial encounter for closed fracture: Secondary | ICD-10-CM | POA: Diagnosis not present

## 2020-09-14 DIAGNOSIS — J9691 Respiratory failure, unspecified with hypoxia: Secondary | ICD-10-CM | POA: Diagnosis not present

## 2020-09-15 DIAGNOSIS — S02651A Fracture of angle of right mandible, initial encounter for closed fracture: Secondary | ICD-10-CM | POA: Diagnosis not present

## 2020-09-15 DIAGNOSIS — J9621 Acute and chronic respiratory failure with hypoxia: Secondary | ICD-10-CM | POA: Diagnosis not present

## 2020-09-15 DIAGNOSIS — J9692 Respiratory failure, unspecified with hypercapnia: Secondary | ICD-10-CM | POA: Diagnosis not present

## 2020-09-15 DIAGNOSIS — N179 Acute kidney failure, unspecified: Secondary | ICD-10-CM | POA: Diagnosis not present

## 2020-09-15 DIAGNOSIS — I482 Chronic atrial fibrillation, unspecified: Secondary | ICD-10-CM | POA: Diagnosis not present

## 2020-09-15 DIAGNOSIS — Z930 Tracheostomy status: Secondary | ICD-10-CM | POA: Diagnosis not present

## 2020-09-15 DIAGNOSIS — J9691 Respiratory failure, unspecified with hypoxia: Secondary | ICD-10-CM | POA: Diagnosis not present

## 2020-09-15 DIAGNOSIS — I469 Cardiac arrest, cause unspecified: Secondary | ICD-10-CM | POA: Diagnosis not present

## 2020-09-15 NOTE — Progress Notes (Signed)
Pulmonary Critical Care Medicine [place]  PROGRESS NOTE     Daniel [name]  [id number]  DOB: 1940-09-01   DOA: 09/07/2020  Referring Physician: Merton Border, MD  HPI: Daniel [name] is a 80 y.o. male seen for follow up of Acute on Chronic Respiratory Failure.  Patient is capping doing very well.  Has been tolerating the BiPAP without any issues.  Secretions are minimal and the patient has a very good strong cough and is able to clear his secretions  Medications: Reviewed on Rounds  Physical Exam:  Vitals: Temperature 96.9 pulse 90 respiratory rate is 21 blood pressure is 99/61 saturations 100%  Ventilator Settings capping off the ventilator  . General: Comfortable at this time . Eyes: Grossly normal lids, irises & conjunctiva . ENT: grossly tongue is normal . Neck: no obvious mass . Cardiovascular: S1 S2 normal no gallop . Respiratory: No rhonchi very coarse breath sounds . Abdomen: soft . Skin: no rash seen on limited exam . Musculoskeletal: not rigid . Psychiatric:unable to assess . Neurologic: no seizure no involuntary movements         Lab Data:   Basic Metabolic Panel: Recent Labs  Lab 09/11/20 0405  NA 146*  K 3.8  CL 110  CO2 27  GLUCOSE 100*  BUN 39*  CREATININE 0.71  CALCIUM 9.0  MG 2.2    ABG: No results for input(s): PHART, PCO2ART, PO2ART, HCO3, O2SAT in the last 168 hours.  Liver Function Tests: No results for input(s): AST, ALT, ALKPHOS, BILITOT, PROT, ALBUMIN in the last 168 hours. No results for input(s): LIPASE, AMYLASE in the last 168 hours. No results for input(s): AMMONIA in the last 168 hours.  CBC: Recent Labs  Lab 09/11/20 0405  WBC 7.6  HGB 10.0*  HCT 35.3*  MCV 92.4  PLT 246    Cardiac Enzymes: No results for input(s): CKTOTAL, CKMB, CKMBINDEX, TROPONINI in the last 168 hours.  BNP (last 3 results) Recent Labs    08/15/20 1100  BNP 1,187.3*     ProBNP (last 3 results) No results for input(s): PROBNP in the last 8760 hours.  Radiological Exams: No results found.  Assessment/Plan Active Problems:   Acute on chronic respiratory failure with hypoxia (HCC)   Cardiac arrest (HCC)   Chronic atrial fibrillation ([place])   Tracheostomy status ([place])   1. Acute on chronic respiratory failure with hypoxia the patient is going to be decannulated 2. Cardiac arrest rhythm is stable no further arrhythmias 3. Chronic atrial fibrillation rate controlled 4. Tracheostomy remains in place   I have personally seen and evaluated the patient, evaluated laboratory and imaging results, formulated the assessment and plan and placed orders. The Patient requires high complexity decision making with multiple systems involvement.  Rounds were done with the Respiratory Therapy Director and Staff therapists and discussed with nursing staff also.  [name], MD [facility] Pulmonary Critical Care Medicine Sleep Medicine

## 2020-09-16 DIAGNOSIS — I482 Chronic atrial fibrillation, unspecified: Secondary | ICD-10-CM | POA: Diagnosis not present

## 2020-09-16 DIAGNOSIS — J9622 Acute and chronic respiratory failure with hypercapnia: Secondary | ICD-10-CM | POA: Diagnosis not present

## 2020-09-16 DIAGNOSIS — I469 Cardiac arrest, cause unspecified: Secondary | ICD-10-CM | POA: Diagnosis not present

## 2020-09-16 DIAGNOSIS — J9691 Respiratory failure, unspecified with hypoxia: Secondary | ICD-10-CM | POA: Diagnosis not present

## 2020-09-16 DIAGNOSIS — J9621 Acute and chronic respiratory failure with hypoxia: Secondary | ICD-10-CM | POA: Diagnosis not present

## 2020-09-16 DIAGNOSIS — N179 Acute kidney failure, unspecified: Secondary | ICD-10-CM | POA: Diagnosis not present

## 2020-09-16 DIAGNOSIS — I4891 Unspecified atrial fibrillation: Secondary | ICD-10-CM | POA: Diagnosis not present

## 2020-09-16 DIAGNOSIS — Z930 Tracheostomy status: Secondary | ICD-10-CM | POA: Diagnosis not present

## 2020-09-16 DIAGNOSIS — J9692 Respiratory failure, unspecified with hypercapnia: Secondary | ICD-10-CM | POA: Diagnosis not present

## 2020-09-16 LAB — CULTURE, RESPIRATORY W GRAM STAIN

## 2020-09-16 NOTE — Progress Notes (Signed)
Pulmonary Critical Care Medicine [place]  PROGRESS NOTE     [name]  ZOX:[id number]  DOB: 29-May-1940   DOA: 09/07/2020  Referring Physician: Merton Border, MD  HPI: Daniel [name] is a 80 y.o. male seen for follow up of Acute on Chronic Respiratory Failure.  [name] decannulated yesterday looks good right now also using the BiPAP he is awake and alert  Medications: Reviewed on Rounds  Physical Exam:  Vitals: Temperature is 96.5 pulse 84 respiratory rate is 20 blood pressure is 119/68 saturations 100%  Ventilator Settings on BiPAP off the ventilator  . General: Comfortable at this time . Eyes: Grossly normal lids, irises & conjunctiva . ENT: grossly tongue is normal . Neck: no obvious mass . Cardiovascular: S1 S2 normal no gallop . Respiratory: No rhonchi very coarse breath sound . Abdomen: soft . Skin: no rash seen on limited exam . Musculoskeletal: not rigid . Psychiatric:unable to assess . Neurologic: no seizure no involuntary movements         Lab Data:   Basic Metabolic Panel: Recent Labs  Lab 09/11/20 0405  NA 146*  K 3.8  CL 110  CO2 27  GLUCOSE 100*  BUN 39*  CREATININE 0.71  CALCIUM 9.0  MG 2.2    ABG: No results for input(s): PHART, PCO2ART, PO2ART, HCO3, O2SAT in the last 168 hours.  Liver Function Tests: No results for input(s): AST, ALT, ALKPHOS, BILITOT, PROT, ALBUMIN in the last 168 hours. No results for input(s): LIPASE, AMYLASE in the last 168 hours. No results for input(s): AMMONIA in the last 168 hours.  CBC: Recent Labs  Lab 09/11/20 0405  WBC 7.6  HGB 10.0*  HCT 35.3*  MCV 92.4  PLT 246    Cardiac Enzymes: No results for input(s): CKTOTAL, CKMB, CKMBINDEX, TROPONINI in the last 168 hours.  BNP (last 3 results) Recent Labs    08/15/20 1100  BNP 1,187.3*    ProBNP (last 3 results) No results for input(s): PROBNP in the last 8760 hours.  Radiological  Exams: No results found.  Assessment/Plan Active Problems:   Acute on chronic respiratory failure with hypoxia (HCC)   Cardiac arrest (HCC)   Chronic atrial fibrillation ([place])   Tracheostomy status ([place])   1. Acute on chronic respiratory failure with hypoxia plan is going to be to continue with BiPAP at nighttime [name] is doing well after decannulation 2. Cardiac arrest rhythm is stable at this time we will continue to monitor 3. Chronic atrial fibrillation rate is controlled    I have personally seen and evaluated the [name], evaluated laboratory and imaging results, formulated the assessment and plan and placed orders. The [name] requires high complexity decision making with multiple systems involvement.  Rounds were done with the Respiratory Therapy Director and Staff therapists and discussed with nursing staff also.  [name], MD [facility] Pulmonary Critical Care Medicine Sleep Medicine

## 2020-09-17 DIAGNOSIS — S02651A Fracture of angle of right mandible, initial encounter for closed fracture: Secondary | ICD-10-CM | POA: Diagnosis not present

## 2020-09-17 DIAGNOSIS — N179 Acute kidney failure, unspecified: Secondary | ICD-10-CM | POA: Diagnosis not present

## 2020-09-17 DIAGNOSIS — J9691 Respiratory failure, unspecified with hypoxia: Secondary | ICD-10-CM | POA: Diagnosis not present

## 2020-09-17 DIAGNOSIS — J9692 Respiratory failure, unspecified with hypercapnia: Secondary | ICD-10-CM | POA: Diagnosis not present

## 2020-09-18 DIAGNOSIS — J9691 Respiratory failure, unspecified with hypoxia: Secondary | ICD-10-CM | POA: Diagnosis not present

## 2020-09-18 DIAGNOSIS — N179 Acute kidney failure, unspecified: Secondary | ICD-10-CM | POA: Diagnosis not present

## 2020-09-18 DIAGNOSIS — J9692 Respiratory failure, unspecified with hypercapnia: Secondary | ICD-10-CM | POA: Diagnosis not present

## 2020-09-18 DIAGNOSIS — S02651A Fracture of angle of right mandible, initial encounter for closed fracture: Secondary | ICD-10-CM | POA: Diagnosis not present

## 2020-09-18 LAB — BASIC METABOLIC PANEL
Anion gap: 5 (ref 5–15)
BUN: 21 mg/dL (ref 8–23)
CO2: 31 mmol/L (ref 22–32)
Calcium: 8.7 mg/dL — ABNORMAL LOW (ref 8.9–10.3)
Chloride: 106 mmol/L (ref 98–111)
Creatinine, Ser: 0.7 mg/dL (ref 0.61–1.24)
GFR, Estimated: >60 mL/min (ref >60)
Glucose, Bld: 128 mg/dL — ABNORMAL HIGH (ref 70–99)
Potassium: 3.4 mmol/L — ABNORMAL LOW (ref 3.5–5.1)
Sodium: 142 mmol/L (ref 135–145)

## 2020-09-18 LAB — CBC
HCT: 35 % — ABNORMAL LOW (ref 39.0–52.0)
Hemoglobin: 10 g/dL — ABNORMAL LOW (ref 13.0–17.0)
MCH: 26 pg (ref 26.0–34.0)
MCHC: 28.6 g/dL — ABNORMAL LOW (ref 30.0–36.0)
MCV: 91.1 fL (ref 80.0–100.0)
Platelets: 179 10*3/uL (ref 150–400)
RBC: 3.84 MIL/uL — ABNORMAL LOW (ref 4.22–5.81)
RDW: 16.4 % — ABNORMAL HIGH (ref 11.5–15.5)
WBC: 9.2 10*3/uL (ref 4.0–10.5)
nRBC: 0 % (ref 0.0–0.2)

## 2020-09-19 DIAGNOSIS — J9621 Acute and chronic respiratory failure with hypoxia: Secondary | ICD-10-CM | POA: Diagnosis not present

## 2020-09-19 DIAGNOSIS — J9622 Acute and chronic respiratory failure with hypercapnia: Secondary | ICD-10-CM | POA: Diagnosis not present

## 2020-09-19 DIAGNOSIS — S02651A Fracture of angle of right mandible, initial encounter for closed fracture: Secondary | ICD-10-CM | POA: Diagnosis not present

## 2020-09-19 DIAGNOSIS — N179 Acute kidney failure, unspecified: Secondary | ICD-10-CM | POA: Diagnosis not present

## 2020-09-20 DIAGNOSIS — J9621 Acute and chronic respiratory failure with hypoxia: Secondary | ICD-10-CM | POA: Diagnosis not present

## 2020-09-20 DIAGNOSIS — N179 Acute kidney failure, unspecified: Secondary | ICD-10-CM | POA: Diagnosis not present

## 2020-09-20 DIAGNOSIS — S02651A Fracture of angle of right mandible, initial encounter for closed fracture: Secondary | ICD-10-CM | POA: Diagnosis not present

## 2020-09-20 DIAGNOSIS — J9622 Acute and chronic respiratory failure with hypercapnia: Secondary | ICD-10-CM | POA: Diagnosis not present

## 2020-09-20 LAB — POTASSIUM: Potassium: 3.6 mmol/L (ref 3.5–5.1)

## 2020-09-21 DIAGNOSIS — N179 Acute kidney failure, unspecified: Secondary | ICD-10-CM | POA: Diagnosis not present

## 2020-09-21 DIAGNOSIS — J9622 Acute and chronic respiratory failure with hypercapnia: Secondary | ICD-10-CM | POA: Diagnosis not present

## 2020-09-21 DIAGNOSIS — S02651A Fracture of angle of right mandible, initial encounter for closed fracture: Secondary | ICD-10-CM | POA: Diagnosis not present

## 2020-09-21 DIAGNOSIS — J9621 Acute and chronic respiratory failure with hypoxia: Secondary | ICD-10-CM | POA: Diagnosis not present

## 2020-09-22 DIAGNOSIS — J40 Bronchitis, not specified as acute or chronic: Secondary | ICD-10-CM | POA: Diagnosis not present

## 2020-09-22 DIAGNOSIS — J449 Chronic obstructive pulmonary disease, unspecified: Secondary | ICD-10-CM | POA: Diagnosis not present

## 2020-09-22 DIAGNOSIS — I4891 Unspecified atrial fibrillation: Secondary | ICD-10-CM | POA: Diagnosis not present

## 2020-09-22 DIAGNOSIS — N179 Acute kidney failure, unspecified: Secondary | ICD-10-CM | POA: Diagnosis not present

## 2020-09-23 DIAGNOSIS — I4891 Unspecified atrial fibrillation: Secondary | ICD-10-CM | POA: Diagnosis not present

## 2020-09-23 DIAGNOSIS — J449 Chronic obstructive pulmonary disease, unspecified: Secondary | ICD-10-CM | POA: Diagnosis not present

## 2020-09-23 DIAGNOSIS — N179 Acute kidney failure, unspecified: Secondary | ICD-10-CM | POA: Diagnosis not present

## 2020-09-23 DIAGNOSIS — J40 Bronchitis, not specified as acute or chronic: Secondary | ICD-10-CM | POA: Diagnosis not present

## 2020-09-24 DIAGNOSIS — I4891 Unspecified atrial fibrillation: Secondary | ICD-10-CM | POA: Diagnosis not present

## 2020-09-24 DIAGNOSIS — J40 Bronchitis, not specified as acute or chronic: Secondary | ICD-10-CM | POA: Diagnosis not present

## 2020-09-24 DIAGNOSIS — N179 Acute kidney failure, unspecified: Secondary | ICD-10-CM | POA: Diagnosis not present

## 2020-09-24 DIAGNOSIS — J449 Chronic obstructive pulmonary disease, unspecified: Secondary | ICD-10-CM | POA: Diagnosis not present

## 2020-09-24 LAB — CBC
HCT: 58.7 % — ABNORMAL HIGH (ref 39.0–52.0)
Hemoglobin: 17.2 g/dL — ABNORMAL HIGH (ref 13.0–17.0)
MCH: 26.1 pg (ref 26.0–34.0)
MCHC: 29.3 g/dL — ABNORMAL LOW (ref 30.0–36.0)
MCV: 89.1 fL (ref 80.0–100.0)
Platelets: 124 10*3/uL — ABNORMAL LOW (ref 150–400)
RBC: 6.59 MIL/uL — ABNORMAL HIGH (ref 4.22–5.81)
RDW: 17.6 % — ABNORMAL HIGH (ref 11.5–15.5)
WBC: 7.2 10*3/uL (ref 4.0–10.5)
nRBC: 0 % (ref 0.0–0.2)

## 2020-09-24 LAB — BASIC METABOLIC PANEL
Anion gap: 6 (ref 5–15)
BUN: 35 mg/dL — ABNORMAL HIGH (ref 8–23)
CO2: 26 mmol/L (ref 22–32)
Calcium: 9 mg/dL (ref 8.9–10.3)
Chloride: 113 mmol/L — ABNORMAL HIGH (ref 98–111)
Creatinine, Ser: 0.66 mg/dL (ref 0.61–1.24)
GFR, Estimated: >60 mL/min (ref >60)
Glucose, Bld: 116 mg/dL — ABNORMAL HIGH (ref 70–99)
Potassium: 3.8 mmol/L (ref 3.5–5.1)
Sodium: 145 mmol/L (ref 135–145)

## 2020-09-25 DIAGNOSIS — J9691 Respiratory failure, unspecified with hypoxia: Secondary | ICD-10-CM | POA: Diagnosis not present

## 2020-09-25 DIAGNOSIS — I4891 Unspecified atrial fibrillation: Secondary | ICD-10-CM | POA: Diagnosis not present

## 2020-09-25 DIAGNOSIS — N179 Acute kidney failure, unspecified: Secondary | ICD-10-CM | POA: Diagnosis not present

## 2020-09-25 DIAGNOSIS — J9692 Respiratory failure, unspecified with hypercapnia: Secondary | ICD-10-CM | POA: Diagnosis not present

## 2020-09-26 DIAGNOSIS — J9691 Respiratory failure, unspecified with hypoxia: Secondary | ICD-10-CM | POA: Diagnosis not present

## 2020-09-26 DIAGNOSIS — J9692 Respiratory failure, unspecified with hypercapnia: Secondary | ICD-10-CM | POA: Diagnosis not present

## 2020-09-26 DIAGNOSIS — N179 Acute kidney failure, unspecified: Secondary | ICD-10-CM | POA: Diagnosis not present

## 2020-09-26 DIAGNOSIS — S02651A Fracture of angle of right mandible, initial encounter for closed fracture: Secondary | ICD-10-CM | POA: Diagnosis not present

## 2020-09-26 LAB — COMPREHENSIVE METABOLIC PANEL
ALT: 48 U/L — ABNORMAL HIGH (ref 0–44)
AST: 34 U/L (ref 15–41)
Albumin: 2.3 g/dL — ABNORMAL LOW (ref 3.5–5.0)
Alkaline Phosphatase: 107 U/L (ref 38–126)
Anion gap: 8 (ref 5–15)
BUN: 33 mg/dL — ABNORMAL HIGH (ref 8–23)
CO2: 24 mmol/L (ref 22–32)
Calcium: 8.9 mg/dL (ref 8.9–10.3)
Chloride: 112 mmol/L — ABNORMAL HIGH (ref 98–111)
Creatinine, Ser: 0.61 mg/dL (ref 0.61–1.24)
GFR, Estimated: >60 mL/min (ref >60)
Glucose, Bld: 97 mg/dL (ref 70–99)
Potassium: 3.6 mmol/L (ref 3.5–5.1)
Sodium: 144 mmol/L (ref 135–145)
Total Bilirubin: 0.5 mg/dL (ref 0.3–1.2)
Total Protein: 7.3 g/dL (ref 6.5–8.1)

## 2020-09-26 LAB — CBC
HCT: 36.3 % — ABNORMAL LOW (ref 39.0–52.0)
Hemoglobin: 10.6 g/dL — ABNORMAL LOW (ref 13.0–17.0)
MCH: 26.5 pg (ref 26.0–34.0)
MCHC: 29.2 g/dL — ABNORMAL LOW (ref 30.0–36.0)
MCV: 90.8 fL (ref 80.0–100.0)
Platelets: 218 10*3/uL (ref 150–400)
RBC: 4 MIL/uL — ABNORMAL LOW (ref 4.22–5.81)
RDW: 16.2 % — ABNORMAL HIGH (ref 11.5–15.5)
WBC: 10.8 10*3/uL — ABNORMAL HIGH (ref 4.0–10.5)
nRBC: 0 % (ref 0.0–0.2)

## 2020-09-27 ENCOUNTER — Other Ambulatory Visit (HOSPITAL_COMMUNITY): Payer: Self-pay

## 2020-09-27 DIAGNOSIS — J9692 Respiratory failure, unspecified with hypercapnia: Secondary | ICD-10-CM | POA: Diagnosis not present

## 2020-09-27 DIAGNOSIS — J9691 Respiratory failure, unspecified with hypoxia: Secondary | ICD-10-CM | POA: Diagnosis not present

## 2020-09-27 DIAGNOSIS — N179 Acute kidney failure, unspecified: Secondary | ICD-10-CM | POA: Diagnosis not present

## 2020-09-27 DIAGNOSIS — I4891 Unspecified atrial fibrillation: Secondary | ICD-10-CM | POA: Diagnosis not present

## 2020-09-28 DIAGNOSIS — I4891 Unspecified atrial fibrillation: Secondary | ICD-10-CM | POA: Diagnosis not present

## 2020-09-28 DIAGNOSIS — J9691 Respiratory failure, unspecified with hypoxia: Secondary | ICD-10-CM | POA: Diagnosis not present

## 2020-09-28 DIAGNOSIS — J9692 Respiratory failure, unspecified with hypercapnia: Secondary | ICD-10-CM | POA: Diagnosis not present

## 2020-09-28 DIAGNOSIS — N179 Acute kidney failure, unspecified: Secondary | ICD-10-CM | POA: Diagnosis not present

## 2020-09-28 LAB — BLOOD GAS, ARTERIAL
Acid-Base Excess: 3.2 mmol/L — ABNORMAL HIGH (ref 0.0–2.0)
Bicarbonate: 27.1 mmol/L (ref 20.0–28.0)
FIO2: 21
O2 Saturation: 97.6 %
Patient temperature: 37
pCO2 arterial: 40.4 mmHg (ref 32.0–48.0)
pH, Arterial: 7.442 (ref 7.350–7.450)
pO2, Arterial: 90 mmHg (ref 83.0–108.0)

## 2020-09-29 DIAGNOSIS — J9692 Respiratory failure, unspecified with hypercapnia: Secondary | ICD-10-CM | POA: Diagnosis not present

## 2020-09-29 DIAGNOSIS — I4891 Unspecified atrial fibrillation: Secondary | ICD-10-CM | POA: Diagnosis not present

## 2020-09-29 DIAGNOSIS — J9691 Respiratory failure, unspecified with hypoxia: Secondary | ICD-10-CM | POA: Diagnosis not present

## 2020-09-29 DIAGNOSIS — N179 Acute kidney failure, unspecified: Secondary | ICD-10-CM | POA: Diagnosis not present

## 2020-09-29 LAB — BLOOD GAS, ARTERIAL
Acid-Base Excess: 2.7 mmol/L — ABNORMAL HIGH (ref 0.0–2.0)
Bicarbonate: 27 mmol/L (ref 20.0–28.0)
FIO2: 21
O2 Saturation: 96.4 %
Patient temperature: 36.7
pCO2 arterial: 43.1 mmHg (ref 32.0–48.0)
pH, Arterial: 7.412 (ref 7.350–7.450)
pO2, Arterial: 81.9 mmHg — ABNORMAL LOW (ref 83.0–108.0)

## 2020-09-30 DIAGNOSIS — G0490 Encephalitis and encephalomyelitis, unspecified: Secondary | ICD-10-CM | POA: Diagnosis not present

## 2020-09-30 DIAGNOSIS — N179 Acute kidney failure, unspecified: Secondary | ICD-10-CM | POA: Diagnosis not present

## 2020-09-30 DIAGNOSIS — J9622 Acute and chronic respiratory failure with hypercapnia: Secondary | ICD-10-CM | POA: Diagnosis not present

## 2020-09-30 DIAGNOSIS — J9621 Acute and chronic respiratory failure with hypoxia: Secondary | ICD-10-CM | POA: Diagnosis not present

## 2020-09-30 DIAGNOSIS — L89893 Pressure ulcer of other site, stage 3: Secondary | ICD-10-CM | POA: Diagnosis not present

## 2020-09-30 DIAGNOSIS — I4891 Unspecified atrial fibrillation: Secondary | ICD-10-CM | POA: Diagnosis not present

## 2020-09-30 DIAGNOSIS — Z9911 Dependence on respirator [ventilator] status: Secondary | ICD-10-CM | POA: Diagnosis not present

## 2020-09-30 DIAGNOSIS — I482 Chronic atrial fibrillation, unspecified: Secondary | ICD-10-CM | POA: Diagnosis not present

## 2020-10-06 ENCOUNTER — Telehealth: Payer: Self-pay | Admitting: Internal Medicine

## 2020-10-06 NOTE — Telephone Encounter (Signed)
If this visit is for home health, it must be done in person or by video in order for insurance to cover.

## 2020-10-06 NOTE — Telephone Encounter (Signed)
Pt's daughter requesting a call back Daniel [name]).  This patient is sch for 10/08/2020 with Dr. Allyson Sabal already.  Per the daughter she is requesting a Telehealth appt instead of an in person visit if possible as he is not mobile.  She's  requesting a call back as she has multiple questions about [place] as well.

## 2020-10-06 NOTE — Telephone Encounter (Signed)
Return call to pt's daughter, Daniel [name] - stated she would like to change in-person appt on Friday to telehealth. Stated her father is unable to stand; she called one agency today for in-home rehab but the could not take him d/t staffing.  She stated if appt can be changed to telehealth, her son Maryan Puls, will be at the home on Friday.  And to call his telephone # - [phone]. Thanks

## 2020-10-08 ENCOUNTER — Encounter: Payer: Medicare Other | Admitting: Student

## 2020-10-11 ENCOUNTER — Emergency Department (HOSPITAL_COMMUNITY): Payer: Medicare Other

## 2020-10-11 ENCOUNTER — Encounter (HOSPITAL_COMMUNITY): Payer: Self-pay | Admitting: *Deleted

## 2020-10-11 ENCOUNTER — Other Ambulatory Visit: Payer: Self-pay

## 2020-10-11 ENCOUNTER — Inpatient Hospital Stay (HOSPITAL_COMMUNITY)
Admission: EM | Admit: 2020-10-11 | Discharge: 2020-11-01 | DRG: 698 | Disposition: A | Discharge status: Skilled Nursing Facility | Payer: Medicare Other | Attending: Student in an Organized Health Care Education/Training Program | Admitting: Student in an Organized Health Care Education/Training Program

## 2020-10-11 DIAGNOSIS — Z8674 Personal history of sudden cardiac arrest: Secondary | ICD-10-CM | POA: Diagnosis not present

## 2020-10-11 DIAGNOSIS — R6520 Severe sepsis without septic shock: Secondary | ICD-10-CM | POA: Diagnosis present

## 2020-10-11 DIAGNOSIS — E039 Hypothyroidism, unspecified: Secondary | ICD-10-CM | POA: Diagnosis present

## 2020-10-11 DIAGNOSIS — M1712 Unilateral primary osteoarthritis, left knee: Secondary | ICD-10-CM | POA: Diagnosis not present

## 2020-10-11 DIAGNOSIS — R410 Disorientation, unspecified: Secondary | ICD-10-CM | POA: Diagnosis not present

## 2020-10-11 DIAGNOSIS — L03317 Cellulitis of buttock: Secondary | ICD-10-CM | POA: Diagnosis not present

## 2020-10-11 DIAGNOSIS — R600 Localized edema: Secondary | ICD-10-CM | POA: Diagnosis not present

## 2020-10-11 DIAGNOSIS — Z7401 Bed confinement status: Secondary | ICD-10-CM | POA: Diagnosis not present

## 2020-10-11 DIAGNOSIS — Z833 Family history of diabetes mellitus: Secondary | ICD-10-CM | POA: Diagnosis not present

## 2020-10-11 DIAGNOSIS — I482 Chronic atrial fibrillation, unspecified: Secondary | ICD-10-CM | POA: Diagnosis present

## 2020-10-11 DIAGNOSIS — A411 Sepsis due to other specified staphylococcus: Secondary | ICD-10-CM | POA: Diagnosis present

## 2020-10-11 DIAGNOSIS — R609 Edema, unspecified: Secondary | ICD-10-CM | POA: Diagnosis not present

## 2020-10-11 DIAGNOSIS — I517 Cardiomegaly: Secondary | ICD-10-CM | POA: Diagnosis not present

## 2020-10-11 DIAGNOSIS — I4892 Unspecified atrial flutter: Secondary | ICD-10-CM | POA: Diagnosis not present

## 2020-10-11 DIAGNOSIS — R0602 Shortness of breath: Secondary | ICD-10-CM | POA: Diagnosis not present

## 2020-10-11 DIAGNOSIS — Z9911 Dependence on respirator [ventilator] status: Secondary | ICD-10-CM | POA: Diagnosis not present

## 2020-10-11 DIAGNOSIS — Z79890 Hormone replacement therapy (postmenopausal): Secondary | ICD-10-CM

## 2020-10-11 DIAGNOSIS — R1313 Dysphagia, pharyngeal phase: Secondary | ICD-10-CM | POA: Diagnosis present

## 2020-10-11 DIAGNOSIS — I472 Ventricular tachycardia: Secondary | ICD-10-CM | POA: Diagnosis not present

## 2020-10-11 DIAGNOSIS — J90 Pleural effusion, not elsewhere classified: Secondary | ICD-10-CM | POA: Diagnosis not present

## 2020-10-11 DIAGNOSIS — J810 Acute pulmonary edema: Secondary | ICD-10-CM | POA: Diagnosis not present

## 2020-10-11 DIAGNOSIS — Z1621 Resistance to vancomycin: Secondary | ICD-10-CM | POA: Diagnosis not present

## 2020-10-11 DIAGNOSIS — S31000A Unspecified open wound of lower back and pelvis without penetration into retroperitoneum, initial encounter: Secondary | ICD-10-CM

## 2020-10-11 DIAGNOSIS — N179 Acute kidney failure, unspecified: Secondary | ICD-10-CM | POA: Diagnosis not present

## 2020-10-11 DIAGNOSIS — N200 Calculus of kidney: Secondary | ICD-10-CM | POA: Diagnosis not present

## 2020-10-11 DIAGNOSIS — L89150 Pressure ulcer of sacral region, unstageable: Secondary | ICD-10-CM | POA: Diagnosis not present

## 2020-10-11 DIAGNOSIS — E860 Dehydration: Secondary | ICD-10-CM | POA: Diagnosis not present

## 2020-10-11 DIAGNOSIS — I872 Venous insufficiency (chronic) (peripheral): Secondary | ICD-10-CM | POA: Diagnosis present

## 2020-10-11 DIAGNOSIS — S83242A Other tear of medial meniscus, current injury, left knee, initial encounter: Secondary | ICD-10-CM | POA: Diagnosis not present

## 2020-10-11 DIAGNOSIS — G8929 Other chronic pain: Secondary | ICD-10-CM | POA: Diagnosis not present

## 2020-10-11 DIAGNOSIS — A419 Sepsis, unspecified organism: Secondary | ICD-10-CM | POA: Diagnosis not present

## 2020-10-11 DIAGNOSIS — M25562 Pain in left knee: Secondary | ICD-10-CM | POA: Diagnosis present

## 2020-10-11 DIAGNOSIS — E278 Other specified disorders of adrenal gland: Secondary | ICD-10-CM | POA: Diagnosis not present

## 2020-10-11 DIAGNOSIS — L89620 Pressure ulcer of left heel, unstageable: Secondary | ICD-10-CM | POA: Diagnosis not present

## 2020-10-11 DIAGNOSIS — M778 Other enthesopathies, not elsewhere classified: Secondary | ICD-10-CM | POA: Diagnosis not present

## 2020-10-11 DIAGNOSIS — J470 Bronchiectasis with acute lower respiratory infection: Secondary | ICD-10-CM | POA: Diagnosis not present

## 2020-10-11 DIAGNOSIS — R509 Fever, unspecified: Secondary | ICD-10-CM | POA: Diagnosis not present

## 2020-10-11 DIAGNOSIS — M25462 Effusion, left knee: Secondary | ICD-10-CM | POA: Diagnosis not present

## 2020-10-11 DIAGNOSIS — Z7901 Long term (current) use of anticoagulants: Secondary | ICD-10-CM

## 2020-10-11 DIAGNOSIS — N390 Urinary tract infection, site not specified: Secondary | ICD-10-CM | POA: Diagnosis present

## 2020-10-11 DIAGNOSIS — I452 Bifascicular block: Secondary | ICD-10-CM | POA: Diagnosis present

## 2020-10-11 DIAGNOSIS — L89893 Pressure ulcer of other site, stage 3: Secondary | ICD-10-CM | POA: Diagnosis not present

## 2020-10-11 DIAGNOSIS — I739 Peripheral vascular disease, unspecified: Secondary | ICD-10-CM | POA: Diagnosis not present

## 2020-10-11 DIAGNOSIS — I4891 Unspecified atrial fibrillation: Secondary | ICD-10-CM | POA: Diagnosis not present

## 2020-10-11 DIAGNOSIS — Z87891 Personal history of nicotine dependence: Secondary | ICD-10-CM | POA: Diagnosis not present

## 2020-10-11 DIAGNOSIS — I2510 Atherosclerotic heart disease of native coronary artery without angina pectoris: Secondary | ICD-10-CM | POA: Diagnosis not present

## 2020-10-11 DIAGNOSIS — Z20822 Contact with and (suspected) exposure to covid-19: Secondary | ICD-10-CM | POA: Diagnosis not present

## 2020-10-11 DIAGNOSIS — M1711 Unilateral primary osteoarthritis, right knee: Secondary | ICD-10-CM | POA: Diagnosis not present

## 2020-10-11 DIAGNOSIS — B952 Enterococcus as the cause of diseases classified elsewhere: Secondary | ICD-10-CM | POA: Diagnosis present

## 2020-10-11 DIAGNOSIS — L89616 Pressure-induced deep tissue damage of right heel: Secondary | ICD-10-CM | POA: Diagnosis present

## 2020-10-11 DIAGNOSIS — R319 Hematuria, unspecified: Secondary | ICD-10-CM | POA: Diagnosis not present

## 2020-10-11 DIAGNOSIS — T83511A Infection and inflammatory reaction due to indwelling urethral catheter, initial encounter: Principal | ICD-10-CM | POA: Diagnosis present

## 2020-10-11 DIAGNOSIS — J9621 Acute and chronic respiratory failure with hypoxia: Secondary | ICD-10-CM | POA: Diagnosis not present

## 2020-10-11 DIAGNOSIS — L89610 Pressure ulcer of right heel, unstageable: Secondary | ICD-10-CM | POA: Diagnosis not present

## 2020-10-11 DIAGNOSIS — Z931 Gastrostomy status: Secondary | ICD-10-CM

## 2020-10-11 DIAGNOSIS — R7881 Bacteremia: Secondary | ICD-10-CM | POA: Diagnosis not present

## 2020-10-11 DIAGNOSIS — R0902 Hypoxemia: Secondary | ICD-10-CM | POA: Diagnosis not present

## 2020-10-11 DIAGNOSIS — M533 Sacrococcygeal disorders, not elsewhere classified: Secondary | ICD-10-CM | POA: Diagnosis not present

## 2020-10-11 DIAGNOSIS — Z79899 Other long term (current) drug therapy: Secondary | ICD-10-CM

## 2020-10-11 DIAGNOSIS — T502X5A Adverse effect of carbonic-anhydrase inhibitors, benzothiadiazides and other diuretics, initial encounter: Secondary | ICD-10-CM | POA: Diagnosis not present

## 2020-10-11 DIAGNOSIS — G0490 Encephalitis and encephalomyelitis, unspecified: Secondary | ICD-10-CM | POA: Diagnosis not present

## 2020-10-11 DIAGNOSIS — S34139A Unspecified injury to sacral spinal cord, initial encounter: Secondary | ICD-10-CM | POA: Diagnosis not present

## 2020-10-11 DIAGNOSIS — R338 Other retention of urine: Secondary | ICD-10-CM | POA: Diagnosis present

## 2020-10-11 DIAGNOSIS — E876 Hypokalemia: Secondary | ICD-10-CM | POA: Diagnosis not present

## 2020-10-11 DIAGNOSIS — Z7982 Long term (current) use of aspirin: Secondary | ICD-10-CM

## 2020-10-11 DIAGNOSIS — Z743 Need for continuous supervision: Secondary | ICD-10-CM | POA: Diagnosis not present

## 2020-10-11 DIAGNOSIS — J9811 Atelectasis: Secondary | ICD-10-CM | POA: Diagnosis not present

## 2020-10-11 DIAGNOSIS — M4628 Osteomyelitis of vertebra, sacral and sacrococcygeal region: Secondary | ICD-10-CM | POA: Diagnosis not present

## 2020-10-11 DIAGNOSIS — I10 Essential (primary) hypertension: Secondary | ICD-10-CM | POA: Diagnosis present

## 2020-10-11 DIAGNOSIS — N210 Calculus in bladder: Secondary | ICD-10-CM | POA: Diagnosis not present

## 2020-10-11 DIAGNOSIS — J479 Bronchiectasis, uncomplicated: Secondary | ICD-10-CM | POA: Diagnosis not present

## 2020-10-11 DIAGNOSIS — L89626 Pressure-induced deep tissue damage of left heel: Secondary | ICD-10-CM | POA: Diagnosis present

## 2020-10-11 DIAGNOSIS — Q7649 Other congenital malformations of spine, not associated with scoliosis: Secondary | ICD-10-CM | POA: Diagnosis not present

## 2020-10-11 DIAGNOSIS — N401 Enlarged prostate with lower urinary tract symptoms: Secondary | ICD-10-CM | POA: Diagnosis present

## 2020-10-11 DIAGNOSIS — M170 Bilateral primary osteoarthritis of knee: Secondary | ICD-10-CM | POA: Diagnosis present

## 2020-10-11 LAB — CBC WITH DIFFERENTIAL/PLATELET
Abs Immature Granulocytes: 0.07 10*3/uL (ref 0.00–0.07)
Basophils Absolute: 0 10*3/uL (ref 0.0–0.1)
Basophils Relative: 0 %
Eosinophils Absolute: 0.1 10*3/uL (ref 0.0–0.5)
Eosinophils Relative: 1 %
HCT: 41.6 % (ref 39.0–52.0)
Hemoglobin: 12.5 g/dL — ABNORMAL LOW (ref 13.0–17.0)
Immature Granulocytes: 1 %
Lymphocytes Relative: 18 %
Lymphs Abs: 2.2 10*3/uL (ref 0.7–4.0)
MCH: 25.8 pg — ABNORMAL LOW (ref 26.0–34.0)
MCHC: 30 g/dL (ref 30.0–36.0)
MCV: 85.8 fL (ref 80.0–100.0)
Monocytes Absolute: 1.1 10*3/uL — ABNORMAL HIGH (ref 0.1–1.0)
Monocytes Relative: 9 %
Neutro Abs: 9.1 10*3/uL — ABNORMAL HIGH (ref 1.7–7.7)
Neutrophils Relative %: 71 %
Platelets: 339 10*3/uL (ref 150–400)
RBC: 4.85 MIL/uL (ref 4.22–5.81)
RDW: 15.9 % — ABNORMAL HIGH (ref 11.5–15.5)
WBC: 12.7 10*3/uL — ABNORMAL HIGH (ref 4.0–10.5)
nRBC: 0 % (ref 0.0–0.2)

## 2020-10-11 LAB — URINALYSIS, ROUTINE W REFLEX MICROSCOPIC
Bilirubin Urine: NEGATIVE
Glucose, UA: NEGATIVE mg/dL
Ketones, ur: NEGATIVE mg/dL
Nitrite: NEGATIVE
Protein, ur: 100 mg/dL — AB
Specific Gravity, Urine: 1.014 (ref 1.005–1.030)
WBC, UA: >50 WBC/hpf — ABNORMAL HIGH (ref 0–5)
pH: 6 (ref 5.0–8.0)

## 2020-10-11 LAB — COMPREHENSIVE METABOLIC PANEL
ALT: 26 U/L (ref 0–44)
AST: 21 U/L (ref 15–41)
Albumin: 2.6 g/dL — ABNORMAL LOW (ref 3.5–5.0)
Alkaline Phosphatase: 112 U/L (ref 38–126)
Anion gap: 8 (ref 5–15)
BUN: 8 mg/dL (ref 8–23)
CO2: 27 mmol/L (ref 22–32)
Calcium: 9.1 mg/dL (ref 8.9–10.3)
Chloride: 103 mmol/L (ref 98–111)
Creatinine, Ser: 0.8 mg/dL (ref 0.61–1.24)
GFR, Estimated: >60 mL/min (ref >60)
Glucose, Bld: 101 mg/dL — ABNORMAL HIGH (ref 70–99)
Potassium: 3.3 mmol/L — ABNORMAL LOW (ref 3.5–5.1)
Sodium: 138 mmol/L (ref 135–145)
Total Bilirubin: 1 mg/dL (ref 0.3–1.2)
Total Protein: 8.2 g/dL — ABNORMAL HIGH (ref 6.5–8.1)

## 2020-10-11 LAB — LACTIC ACID, PLASMA
Lactic Acid, Venous: 2.3 mmol/L (ref 0.5–1.9)
Lactic Acid, Venous: 2.7 mmol/L (ref 0.5–1.9)

## 2020-10-11 IMAGING — CT CT ABD-PELV W/O CM
2 of 4 series · 15 of 46 positions shown, 17 images · non-contrast
Comparison: CT [DATE]

CLINICAL DATA: Abdomen pain with fever

EXAM:
CT ABDOMEN AND PELVIS WITHOUT CONTRAST
TECHNIQUE: Multidetector CT imaging of the abdomen and pelvis was performed
following the standard protocol without IV contrast.

[Series 3: a/p w/o 5mm · axial · non-contrast · 0.98mm/px · z∈[+884,+1334]mm · 12 of 100 slices shown, 14 images]
[im 5/100  soft-tissue]
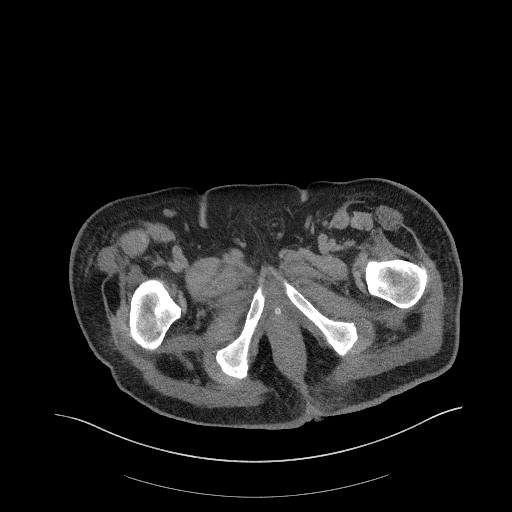
[im 5/100  bone]
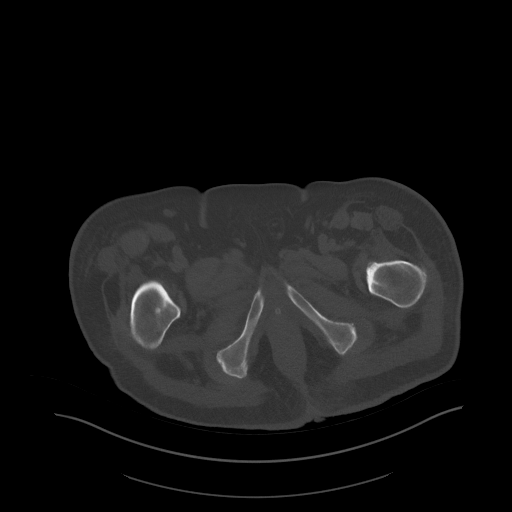
[im 13/100  soft-tissue]
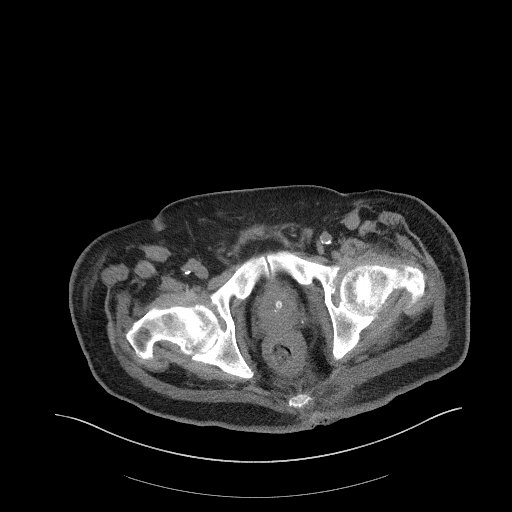
[im 21/100  soft-tissue]
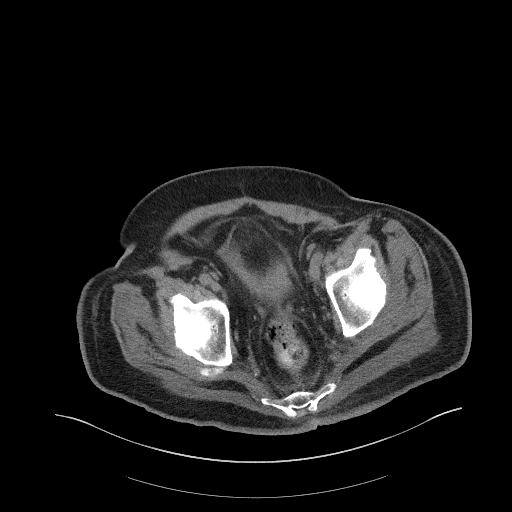
[im 29/100  soft-tissue]
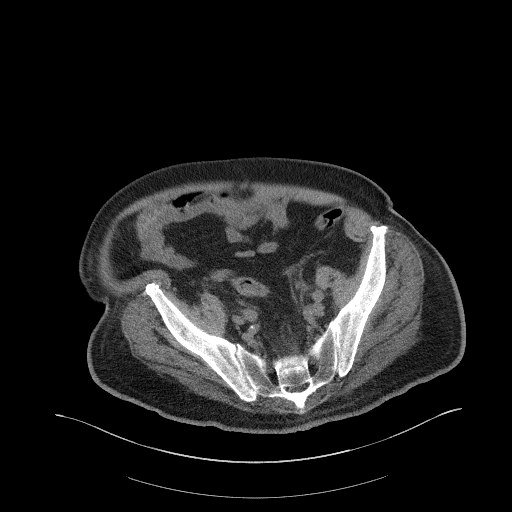
[im 38/100  soft-tissue]
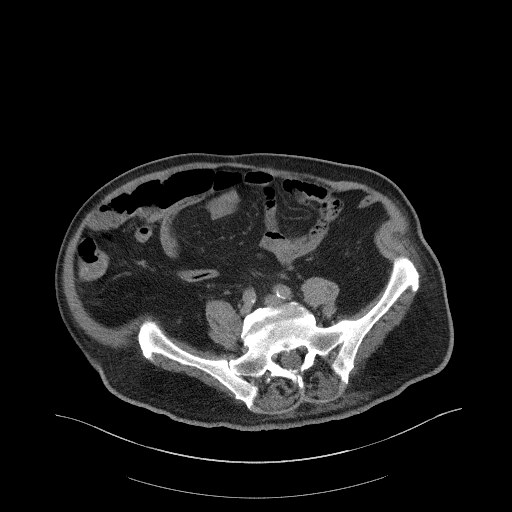
[im 46/100  soft-tissue]
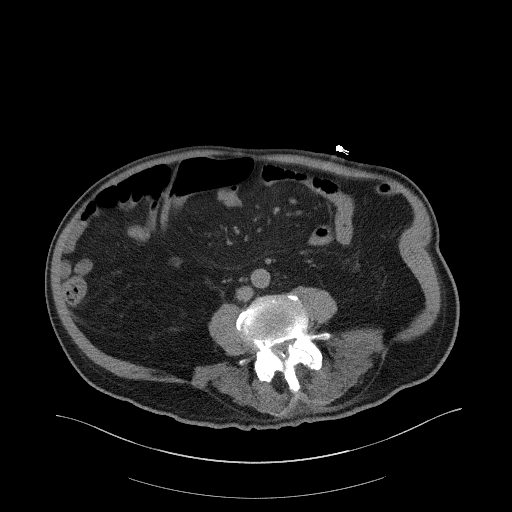
[im 54/100  soft-tissue]
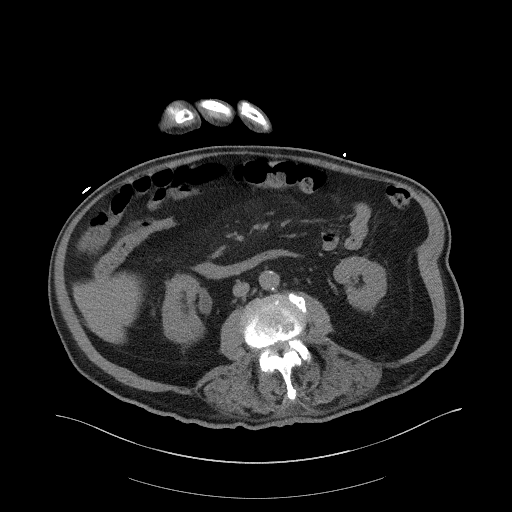
[im 62/100  soft-tissue]
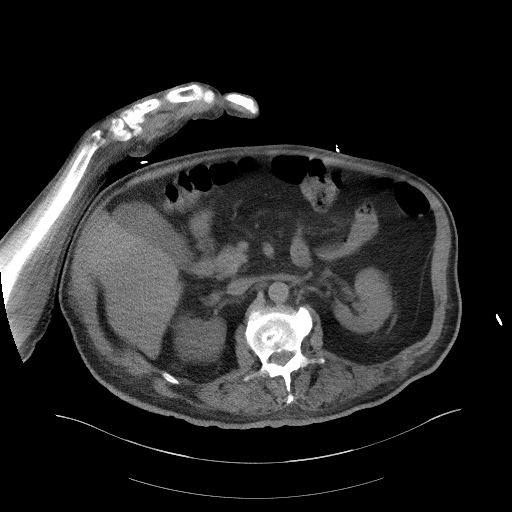
[im 71/100  soft-tissue]
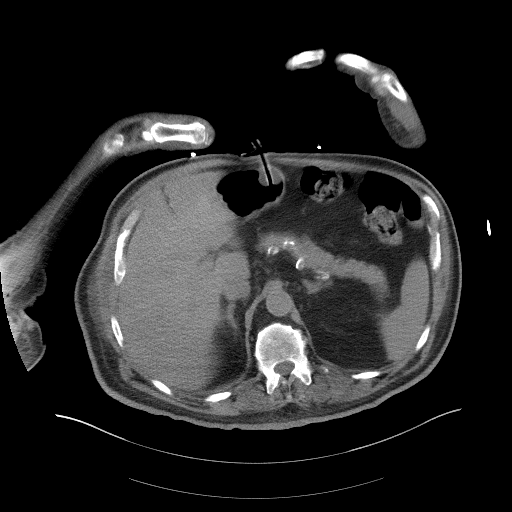
[im 71/100  bone]
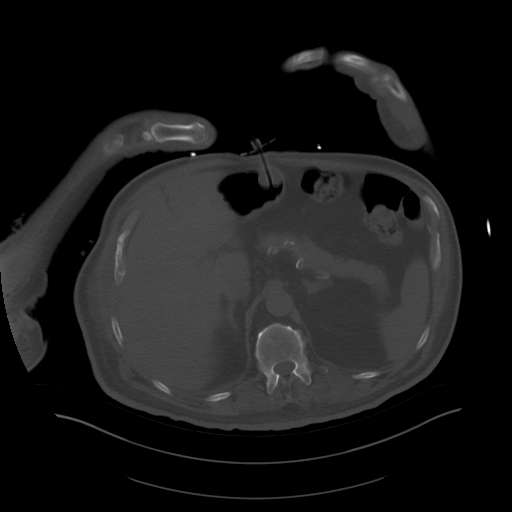
[im 79/100  soft-tissue]
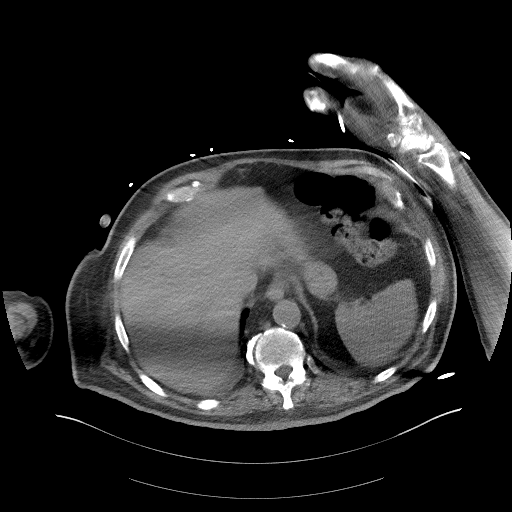
[im 87/100  soft-tissue]
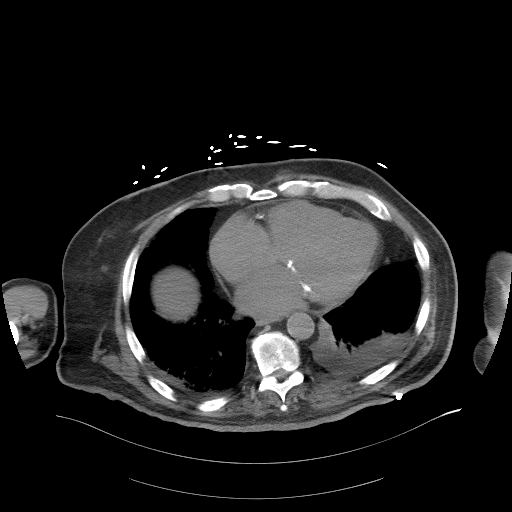
[im 95/100  soft-tissue]
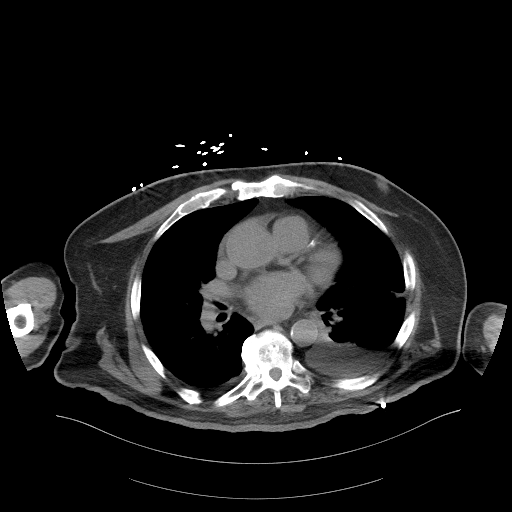

[Series 6: a/p w/o cor · coronal · non-contrast · 0.98mm/px · 3 of 145 slices shown]
[im 49/145  soft-tissue]
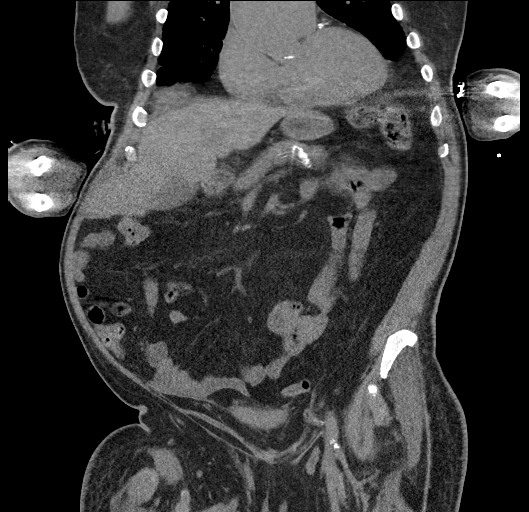
[im 65/145  soft-tissue]
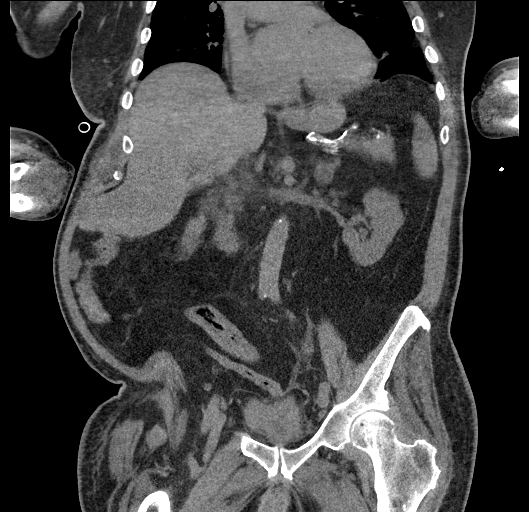
[im 81/145  soft-tissue]
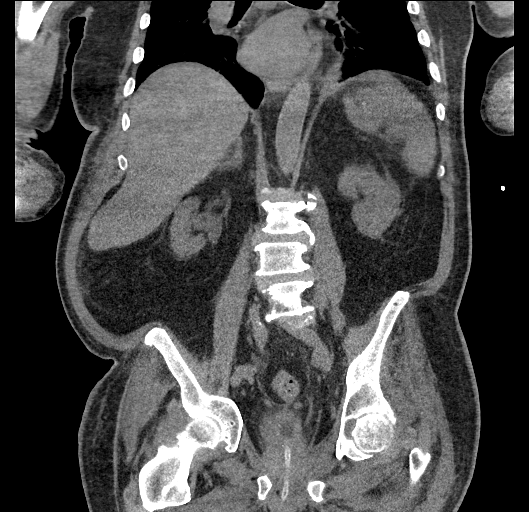

[15 of 46 positions shown; findings below may reference images not displayed]

FINDINGS: Lower chest: Lung bases demonstrate trace right pleural effusion and
small left pleural effusion which are mildly diminished. Mild
aneurysmal dilatation of ascending aorta up to 4.2 cm. Coronary
vascular calcification. Mitral calcification. Mild cardiomegaly.
Partial consolidation within the left lower lobe slightly improved.
Nodular thickening along the right pulmonary fissure. Multiple
scattered pulmonary nodules measuring less than 5 mm.

Hepatobiliary: No focal liver abnormality is seen. No gallstones,
gallbladder wall thickening, or biliary dilatation.

Pancreas: Unremarkable. No pancreatic ductal dilatation or
surrounding inflammatory changes.

Spleen: Normal in size without focal abnormality.

Adrenals/Urinary Tract: Right adrenal gland is normal. 15 mm left
adrenal gland nodule without change probably adenoma. Prominent
renal pelvises without frank hydronephrosis. Small right greater
than left intrarenal calculi. No ureteral stones. Foley catheter in
the bladder which is thick-walled. Multiple punctate stones within
the posterior bladder.

Stomach/Bowel: Gastrostomy tube within the distal body of the
stomach. Stomach is nonenlarged. No dilated small bowel. No acute
bowel wall thickening.

Vascular/Lymphatic: Moderate aortic atherosclerosis. No aneurysm. No
suspicious nodes.

Reproductive: Prostate is unremarkable.

Other: Negative for free air or free fluid. Trace presacral soft
tissue stranding and fluid.

Musculoskeletal: Grade 1 anterolisthesis L4 on L5. Right amend treat
disc at S1-S2. No definitive osseous destructive change.
Sacrococcygeal skin thickening with hazy infiltration of the
subcutaneous fat overlying the sacrococcygeal region and slightly
indistinct appearance of the gluteus muscle medially. No definitive
soft tissue gas or fluid collection.
IMPRESSION: 1. Cardiomegaly with trace right and small left pleural effusions,
decreased compared to prior. Partial consolidation left lower lobe
with some improvement in aeration since [DATE] comparison CT.
Nodular thickening along the right pulmonary fissure with multiple
scattered pulmonary nodules measuring less than 5 mm. No follow-up
needed if patient is low-risk (and has no known or suspected primary
neoplasm). Non-contrast chest CT can be considered in 12 months if
patient is high-risk. This recommendation follows the consensus
statement: Guidelines for Management of Incidental Pulmonary Nodules
Detected on CT Images: From the [HOSPITAL] [QN]; Radiology
[QN]; [DATE].
2. Intrarenal calculi bilaterally without definitive hydronephrosis
or ureteral stone. Decompressed thick-walled urinary bladder
questionable for cystitis. There are multiple small stones within
the bladder.
3. Edema and soft tissue inflammatory change within the
sacrococcygeal region, involves the subcutaneous fat and left medial
gluteus musculature suggestive of soft tissue infection or
inflammation. No definite gas or fluid collection at this location.
No definitive osseous destructive change.

## 2020-10-11 IMAGING — DX DG KNEE 1-2V PORT*L*
1 series · 2 of 2 positions shown · non-contrast
Comparison: None.

CLINICAL DATA: Bilateral knee pain, no known injury

EXAM:
PORTABLE LEFT KNEE - 1-2 VIEW; PORTABLE RIGHT KNEE - 1-2 VIEW

[Series 1: knee · 0.14mm/px · 2 of 2 slices shown]
[im 1/2]
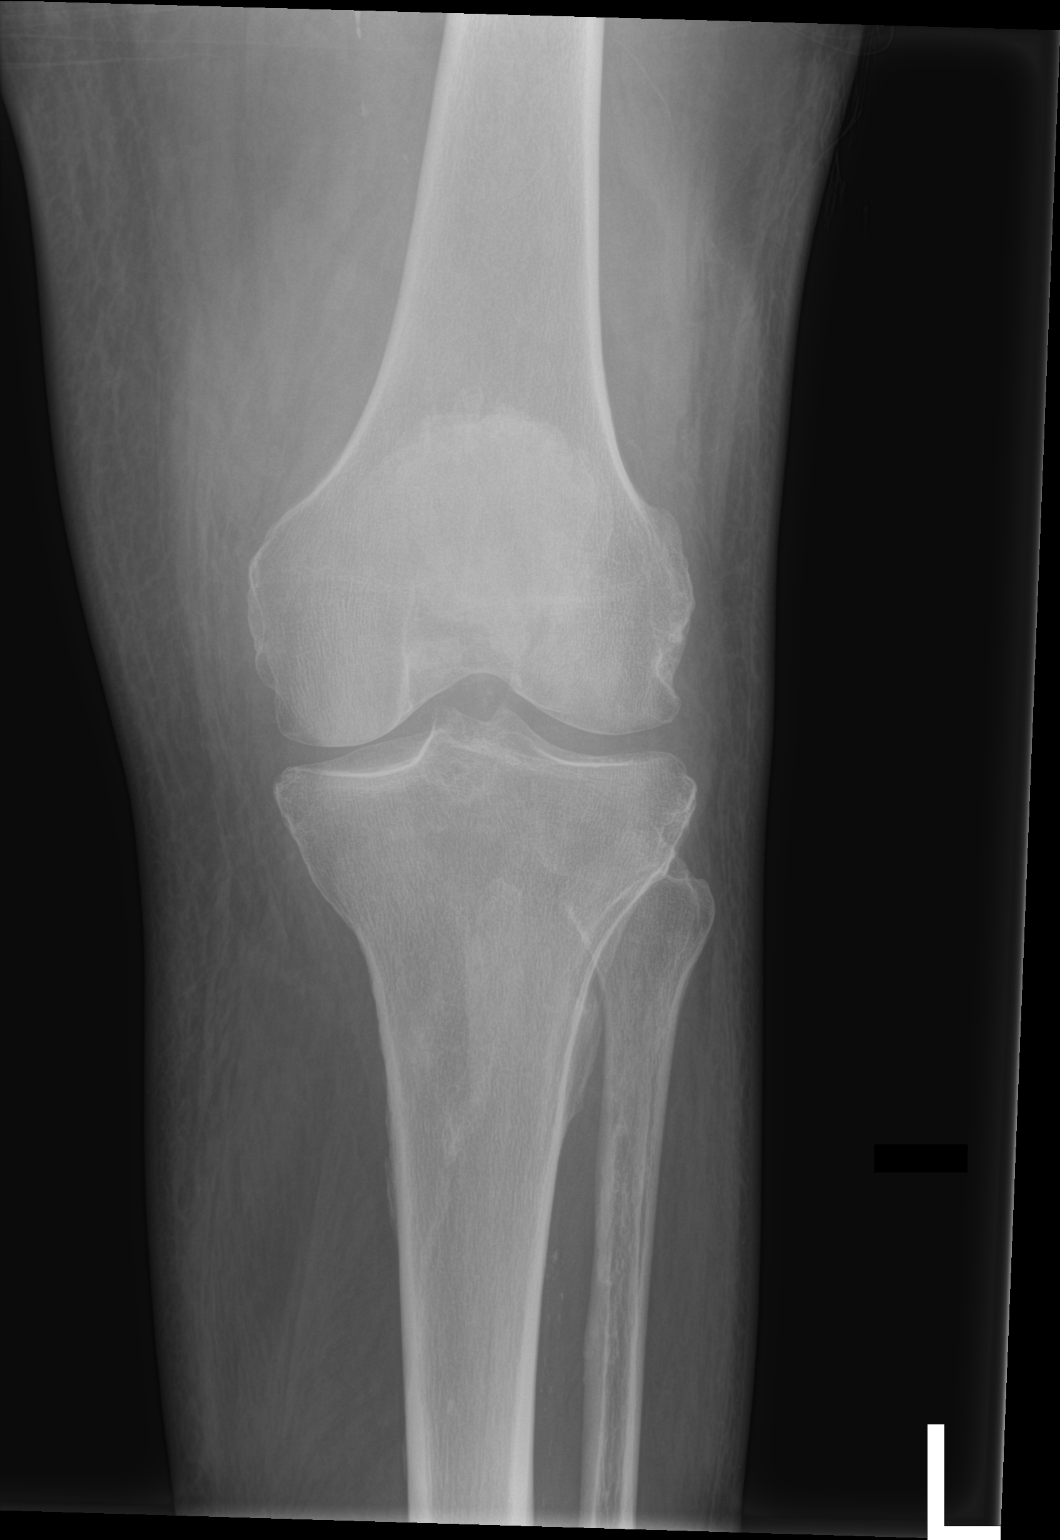
[im 2/2]
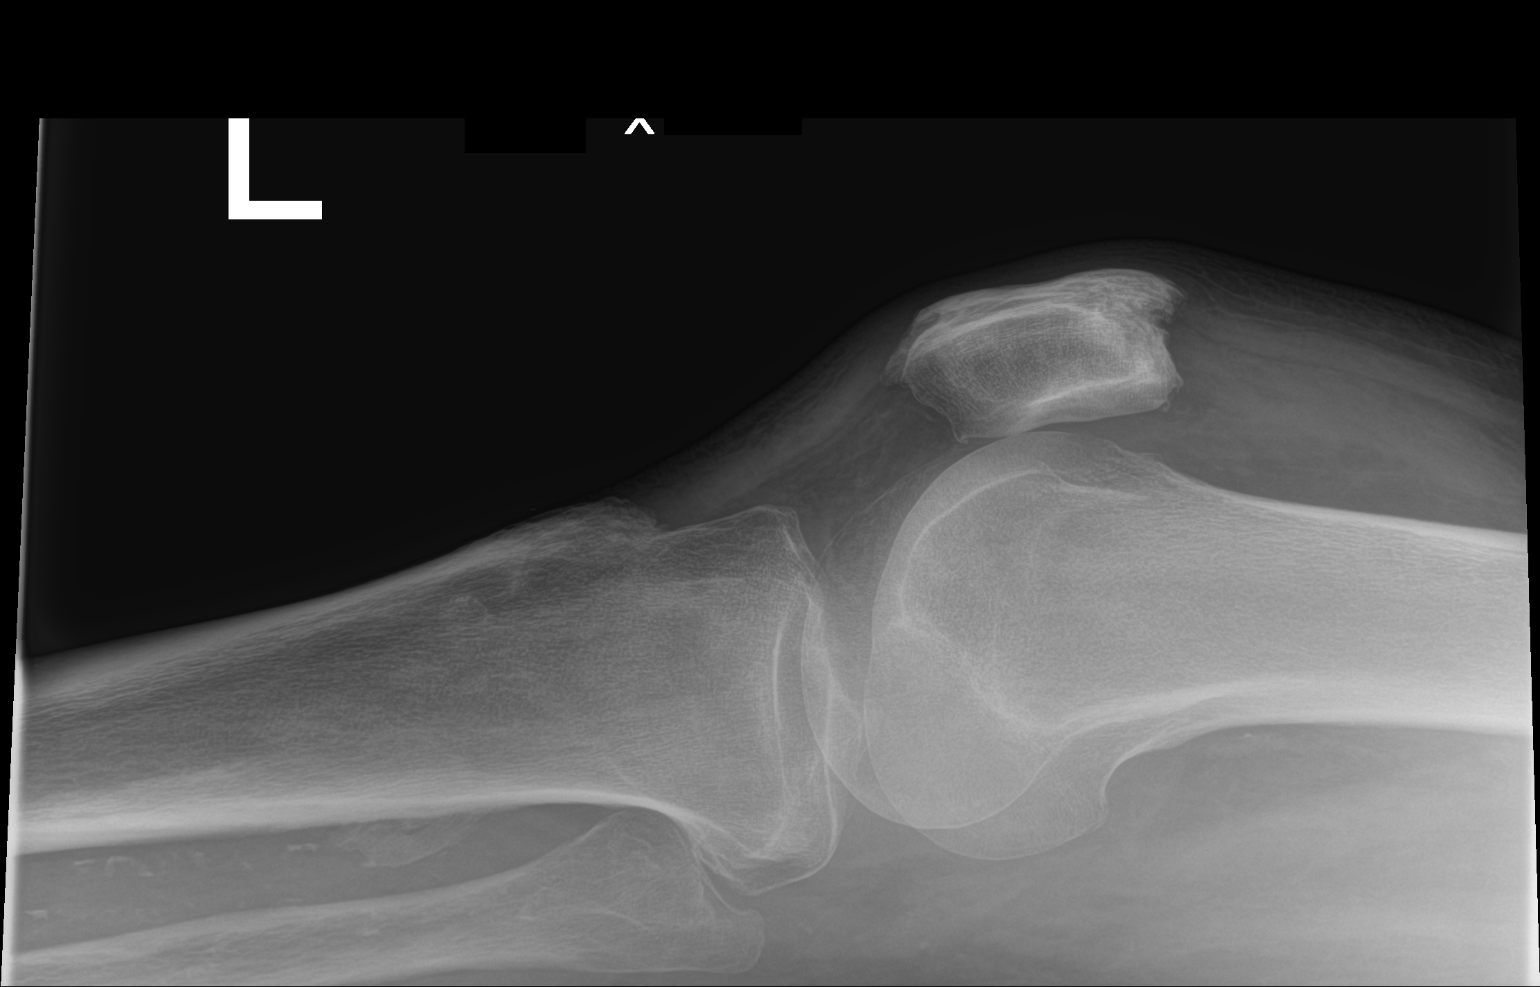

[2 of 2 positions shown; findings below may reference images not displayed]

FINDINGS: No fracture or dislocation of the bilateral knees. Mild
patellofemoral compartment arthrosis bilaterally preserved medial
and lateral compartments. No knee joint effusion. Soft tissues are
unremarkable.
IMPRESSION: No fracture or dislocation of the bilateral knees. Mild
patellofemoral compartment arthrosis bilaterally. No knee joint
effusion.

## 2020-10-11 IMAGING — DX DG KNEE 1-2V PORT*R*
1 series · 2 of 2 positions shown · non-contrast
Comparison: None.

CLINICAL DATA: Bilateral knee pain, no known injury

EXAM:
PORTABLE LEFT KNEE - 1-2 VIEW; PORTABLE RIGHT KNEE - 1-2 VIEW

[Series 1: knee · 0.14mm/px · 2 of 2 slices shown]
[im 1/2]
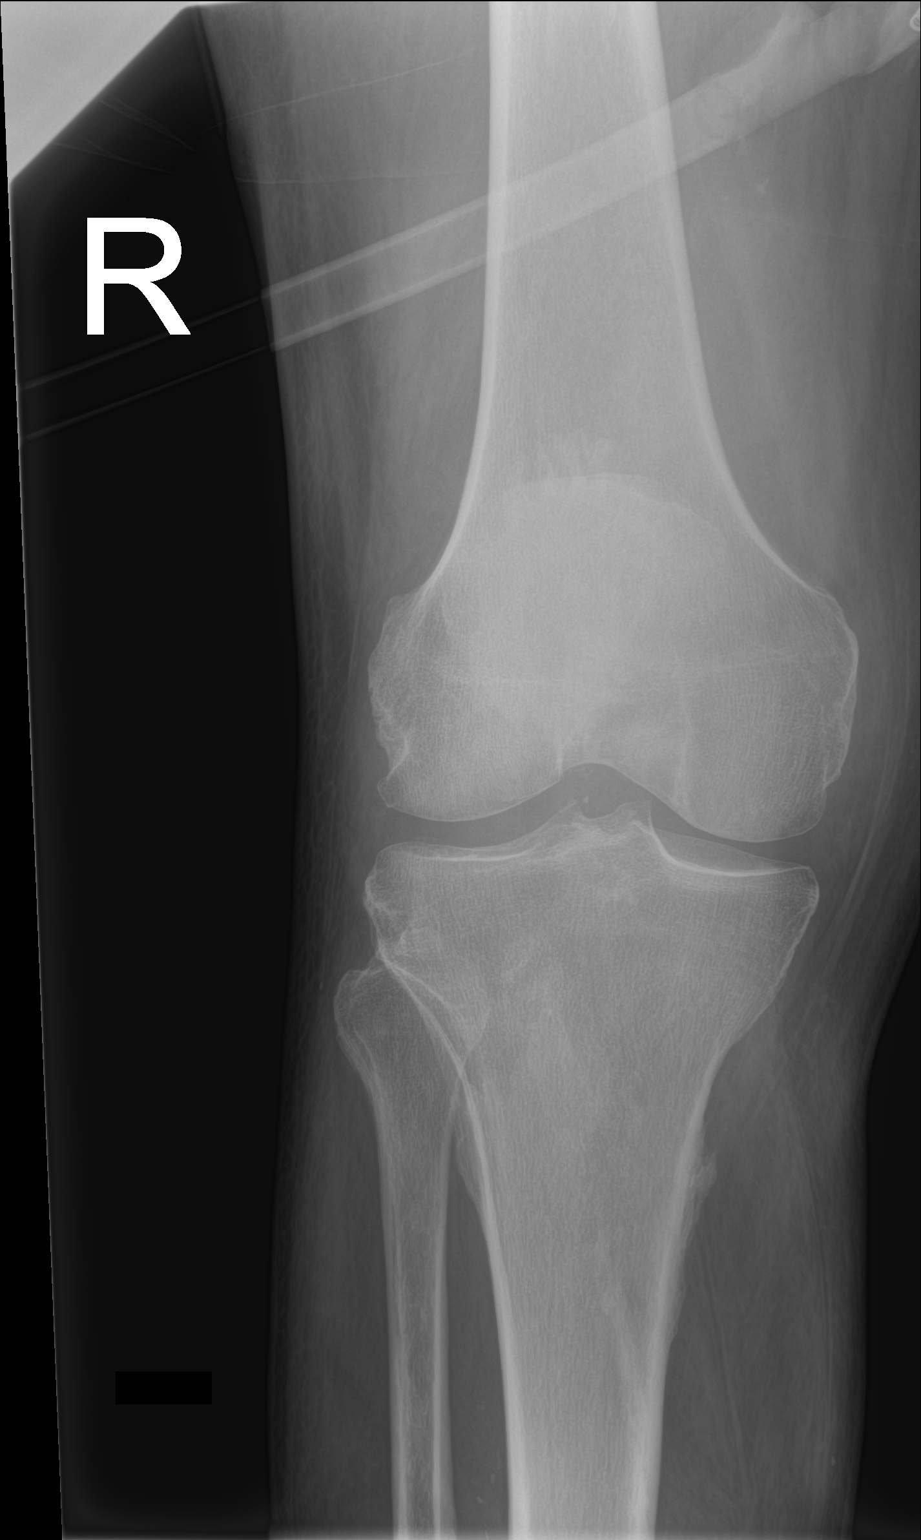
[im 2/2]
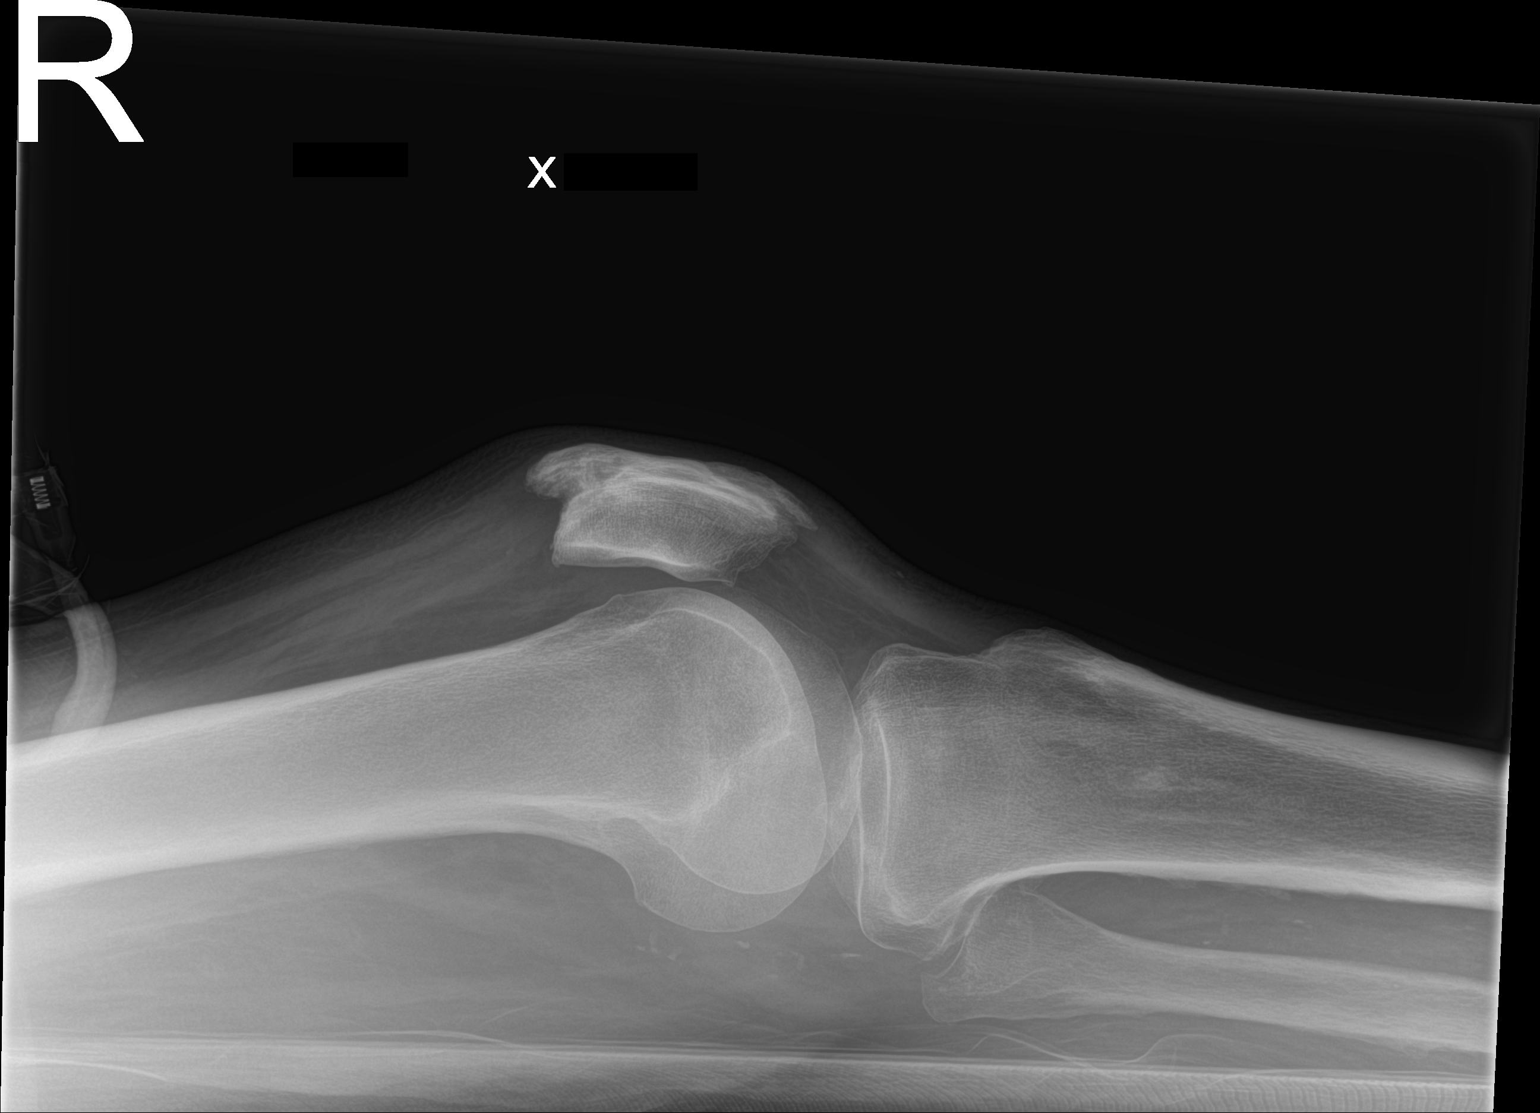

[2 of 2 positions shown; findings below may reference images not displayed]

FINDINGS: No fracture or dislocation of the bilateral knees. Mild
patellofemoral compartment arthrosis bilaterally preserved medial
and lateral compartments. No knee joint effusion. Soft tissues are
unremarkable.
IMPRESSION: No fracture or dislocation of the bilateral knees. Mild
patellofemoral compartment arthrosis bilaterally. No knee joint
effusion.

## 2020-10-11 MED ORDER — HYDROCODONE-ACETAMINOPHEN 5-325 MG PO TABS: 1.0000 | ORAL_TABLET | Freq: Four times a day (QID) | ORAL | Status: DC | PRN

## 2020-10-11 MED ORDER — PIPERACILLIN-TAZOBACTAM 3.375 G IVPB 30 MIN
3.3750 g | Freq: Once | INTRAVENOUS | Status: AC
  Administered 2020-10-11: 3.375 g via INTRAVENOUS
  Filled 2020-10-11: qty 50

## 2020-10-11 MED ORDER — PIPERACILLIN-TAZOBACTAM 3.375 G IVPB
3.3750 g | Freq: Three times a day (TID) | INTRAVENOUS | Status: DC
  Administered 2020-10-12: 3.375 g via INTRAVENOUS
  Filled 2020-10-11: qty 50

## 2020-10-11 MED ORDER — LACTATED RINGERS IV BOLUS
1000.0000 mL | Freq: Once | INTRAVENOUS | Status: AC
  Administered 2020-10-11: 1000 mL via INTRAVENOUS

## 2020-10-11 MED ORDER — LACTATED RINGERS IV SOLN: INTRAVENOUS | Status: DC

## 2020-10-11 MED ORDER — VANCOMYCIN HCL 1000 MG/200ML IV SOLN
1000.0000 mg | Freq: Two times a day (BID) | INTRAVENOUS | Status: DC
  Administered 2020-10-12 – 2020-10-15 (×8): 1000 mg via INTRAVENOUS
  Filled 2020-10-11 (×9): qty 200

## 2020-10-11 MED ORDER — LIDOCAINE-EPINEPHRINE 1 %-1:100000 IJ SOLN
20.0000 mL | Freq: Once | INTRAMUSCULAR | Status: AC
  Administered 2020-10-11: 20 mL via INTRADERMAL
  Filled 2020-10-11: qty 1

## 2020-10-11 MED ORDER — VANCOMYCIN HCL 2000 MG/400ML IV SOLN
2000.0000 mg | Freq: Once | INTRAVENOUS | Status: AC
  Administered 2020-10-11: 2000 mg via INTRAVENOUS
  Filled 2020-10-11: qty 400

## 2020-10-11 MED ORDER — ENOXAPARIN SODIUM 120 MG/0.8ML IJ SOSY
115.0000 mg | PREFILLED_SYRINGE | Freq: Two times a day (BID) | INTRAMUSCULAR | Status: DC
  Administered 2020-10-12 – 2020-10-13 (×4): 115 mg via SUBCUTANEOUS
  Filled 2020-10-11 (×5): qty 0.76

## 2020-10-11 MED ORDER — ACETAMINOPHEN 325 MG PO TABS
650.0000 mg | ORAL_TABLET | Freq: Once | ORAL | Status: AC
  Administered 2020-10-11: 650 mg via ORAL
  Filled 2020-10-11: qty 2

## 2020-10-11 MED ORDER — HYDROMORPHONE HCL 1 MG/ML IJ SOLN
0.5000 mg | Freq: Once | INTRAMUSCULAR | Status: AC
Start: 2020-10-11 — End: 2020-10-11
  Administered 2020-10-11: 0.5 mg via INTRAVENOUS
  Filled 2020-10-11: qty 1

## 2020-10-11 MED ORDER — POTASSIUM CHLORIDE 20 MEQ PO PACK
40.0000 meq | PACK | Freq: Once | ORAL | Status: AC
  Administered 2020-10-11: 40 meq via ORAL
  Filled 2020-10-11: qty 2

## 2020-10-11 NOTE — ED Notes (Signed)
Old sacral pad for pressure ulcer removed and new one placed. Hospitalist at bedside to help turn patient and assess back and bottom. Verbal order for new foley catheter insertion given; new foley placed.

## 2020-10-11 NOTE — Progress Notes (Signed)
ANTICOAGULATION CONSULT NOTE - Initial Consult  Pharmacy Consult for lovenox Indication: atrial fibrillation  No Known Allergies  Patient Measurements: Height: 5\' 10"  (177.8 cm) Weight: 116.2 kg (256 lb 2.8 oz) IBW/kg (Calculated) : 73   Vital Signs: Temp: 100.6 F (38.1 C) (05/30 2203) Temp Source: Rectal (05/30 2203) BP: 112/87 (05/30 2200) Pulse Rate: 81 (05/30 2200)  Labs: Recent Labs    10/11/20 1543  HGB 12.5*  HCT 41.6  PLT 339  CREATININE 0.80    Estimated Creatinine Clearance: 95.6 mL/min (by C-G formula based on SCr of 0.8 mg/dL).   Medical History: Past Medical History:  Diagnosis Date  . Acute on chronic respiratory failure with hypoxia ([place])   . Allergy   . Arthritis   . Cardiac arrest ([place])   . Cellulitis 05/07/2019  . Chronic atrial fibrillation ([place])   . Chronic renal insufficiency   . Glaucoma   . Healthcare-associated pneumonia   . Hypertension   . Pleural effusion   . Tracheostomy status (HCC)    Assessment: 50 YOM presenting with knee/buttocks pain, sacral decub ulcer, hx of afib on Eliquis PTA.  Chronic anemia stable, plts wnl  Goal of Therapy:  Anti-Xa level 0.6-1 units/ml 4hrs after LMWH dose given Monitor platelets by anticoagulation protocol: Yes   Plan:  Lovenox 1mg /kg (115mg ) SQ every 12 hours Monitor renal function, s/s bleeding F/u plans for wound intervention and ability to restart Eliquis  Bertis Ruddy, PharmD Clinical Pharmacist ED Pharmacist Phone # [phone] 10/11/2020 10:17 PM

## 2020-10-11 NOTE — ED Notes (Signed)
Patient transported to CT 
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
## 2020-10-11 NOTE — ED Notes (Signed)
Daniel [name] (pt daughter) called for update nurse  please call [phone] when available

## 2020-10-11 NOTE — Progress Notes (Signed)
Pharmacy Antibiotic Note  Daniel [name] is a 79 y.o. male admitted on 10/11/2020 with sepsis, sacral decub ulcer.  Pharmacy has been consulted for vancomycin and zosyn dosing.  Received vancomycin 2000 mg IV x 1 in ED  Plan: Vancomycin 1000 mg IV q 12h (eAUC 502, Goal AUC 400 - 550, SCr 0.8) Zosyn 3.375g IV every 8 hours (extended infusion) Monitor renal function, Cx and clinical progression to narrow Vancomycin levels as indicated  Height: 5\' 10"  (177.8 cm) Weight: 116.2 kg (256 lb 2.8 oz) IBW/kg (Calculated) : 73  Temp (24hrs), Avg:100 F (37.8 C), Min:98.5 F (36.9 C), Max:100.9 F (38.3 C)  Recent Labs  Lab 10/11/20 1543 10/11/20 1843  WBC 12.7*  --   CREATININE 0.80  --   LATICACIDVEN 2.3* 2.7*    Estimated Creatinine Clearance: 95.6 mL/min (by C-G formula based on SCr of 0.8 mg/dL).    No Known Allergies  Bertis Ruddy, PharmD Clinical Pharmacist ED Pharmacist Phone # [phone] 10/11/2020 10:10 PM

## 2020-10-11 NOTE — ED Notes (Signed)
Spoke with daughter and gave update on pt

## 2020-10-11 NOTE — ED Notes (Signed)
Pt LT PIV noted to be infiltrated. Vancomycin paused and warm compress and elevation of extremity completed.
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
## 2020-10-11 NOTE — ED Triage Notes (Signed)
Pt here from home via [facility].  C/o bil knee pain and swelling x 1 week and worsening pressure ulcer.  Vs: 138/88 97.2 rr 18 Hr 98 cbg 139

## 2020-10-12 ENCOUNTER — Telehealth: Payer: Medicare Other | Admitting: Student

## 2020-10-12 ENCOUNTER — Inpatient Hospital Stay (HOSPITAL_COMMUNITY): Payer: Medicare Other

## 2020-10-12 DIAGNOSIS — A419 Sepsis, unspecified organism: Secondary | ICD-10-CM | POA: Diagnosis not present

## 2020-10-12 DIAGNOSIS — R6520 Severe sepsis without septic shock: Secondary | ICD-10-CM | POA: Diagnosis not present

## 2020-10-12 DIAGNOSIS — L89150 Pressure ulcer of sacral region, unstageable: Secondary | ICD-10-CM | POA: Diagnosis present

## 2020-10-12 DIAGNOSIS — M25562 Pain in left knee: Secondary | ICD-10-CM | POA: Diagnosis present

## 2020-10-12 DIAGNOSIS — L89616 Pressure-induced deep tissue damage of right heel: Secondary | ICD-10-CM | POA: Diagnosis present

## 2020-10-12 DIAGNOSIS — L89626 Pressure-induced deep tissue damage of left heel: Secondary | ICD-10-CM | POA: Diagnosis present

## 2020-10-12 LAB — BLOOD CULTURE ID PANEL (REFLEXED) - BCID2

## 2020-10-12 LAB — URINALYSIS, ROUTINE W REFLEX MICROSCOPIC
Bilirubin Urine: NEGATIVE
Glucose, UA: NEGATIVE mg/dL
Hgb urine dipstick: NEGATIVE
Ketones, ur: NEGATIVE mg/dL
Nitrite: NEGATIVE
Protein, ur: NEGATIVE mg/dL
Specific Gravity, Urine: 1.015 (ref 1.005–1.030)
pH: 5 (ref 5.0–8.0)

## 2020-10-12 LAB — CBC WITH DIFFERENTIAL/PLATELET
Abs Immature Granulocytes: 0.04 10*3/uL (ref 0.00–0.07)
Basophils Absolute: 0 10*3/uL (ref 0.0–0.1)
Basophils Relative: 0 %
Eosinophils Absolute: 0.1 10*3/uL (ref 0.0–0.5)
Eosinophils Relative: 1 %
HCT: 36.7 % — ABNORMAL LOW (ref 39.0–52.0)
Hemoglobin: 11.1 g/dL — ABNORMAL LOW (ref 13.0–17.0)
Immature Granulocytes: 0 %
Lymphocytes Relative: 15 %
Lymphs Abs: 1.5 10*3/uL (ref 0.7–4.0)
MCH: 25.4 pg — ABNORMAL LOW (ref 26.0–34.0)
MCHC: 30.2 g/dL (ref 30.0–36.0)
MCV: 84 fL (ref 80.0–100.0)
Monocytes Absolute: 0.9 10*3/uL (ref 0.1–1.0)
Monocytes Relative: 9 %
Neutro Abs: 7.4 10*3/uL (ref 1.7–7.7)
Neutrophils Relative %: 75 %
Platelets: 260 10*3/uL (ref 150–400)
RBC: 4.37 MIL/uL (ref 4.22–5.81)
RDW: 15.9 % — ABNORMAL HIGH (ref 11.5–15.5)
WBC: 9.9 10*3/uL (ref 4.0–10.5)
nRBC: 0 % (ref 0.0–0.2)

## 2020-10-12 LAB — BASIC METABOLIC PANEL
Anion gap: 8 (ref 5–15)
BUN: 8 mg/dL (ref 8–23)
CO2: 26 mmol/L (ref 22–32)
Calcium: 8.7 mg/dL — ABNORMAL LOW (ref 8.9–10.3)
Chloride: 103 mmol/L (ref 98–111)
Creatinine, Ser: 0.83 mg/dL (ref 0.61–1.24)
GFR, Estimated: >60 mL/min (ref >60)
Glucose, Bld: 96 mg/dL (ref 70–99)
Potassium: 3.5 mmol/L (ref 3.5–5.1)
Sodium: 137 mmol/L (ref 135–145)

## 2020-10-12 LAB — MAGNESIUM: Magnesium: 1.7 mg/dL (ref 1.7–2.4)

## 2020-10-12 LAB — SYNOVIAL CELL COUNT + DIFF, W/ CRYSTALS
Crystals, Fluid: NONE SEEN
Eosinophils-Synovial: 0 % (ref 0–1)
Lymphocytes-Synovial Fld: 0 % (ref 0–20)
Monocyte-Macrophage-Synovial Fluid: 24 % — ABNORMAL LOW (ref 50–90)
Neutrophil, Synovial: 76 % — ABNORMAL HIGH (ref 0–25)
WBC, Synovial: 915 /mm3 — ABNORMAL HIGH (ref 0–200)

## 2020-10-12 LAB — SEDIMENTATION RATE: Sed Rate: 40 mm/hr — ABNORMAL HIGH (ref 0–16)

## 2020-10-12 LAB — C-REACTIVE PROTEIN: CRP: 8.1 mg/dL — ABNORMAL HIGH (ref <1.0)

## 2020-10-12 LAB — PROCALCITONIN: Procalcitonin: <0.1 ng/mL

## 2020-10-12 LAB — LACTIC ACID, PLASMA: Lactic Acid, Venous: 1.3 mmol/L (ref 0.5–1.9)

## 2020-10-12 IMAGING — MR MR PELVIS WO/W CM
4 of 8 series · 24 of 48 positions shown · IV contrast (gadavist)
Comparison: None.

CLINICAL DATA: Sacral osteomyelitis, sacral wound.

EXAM:
MRI PELVIS WITHOUT AND WITH CONTRAST
TECHNIQUE: Multiplanar multisequence MR imaging of the pelvis was performed
both before and after administration of intravenous contrast.
CONTRAST:  7mL GADAVIST GADOBUTROL 1 MMOL/ML IV SOLN

[Series 3: T2 fat-sat · sagittal · 4.0mm · 0.81mm/px · 5 of 25 slices shown (1 of 2)]
[im 1/25]
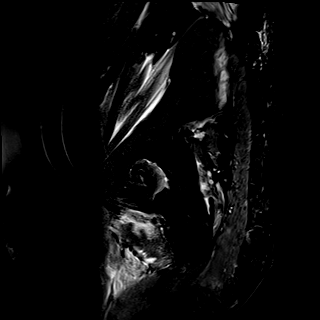
[im 7/25]
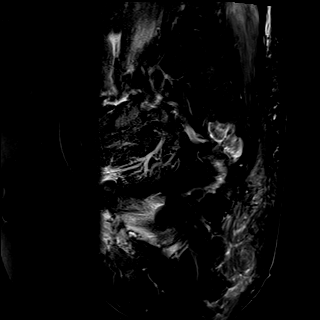
[im 13/25]
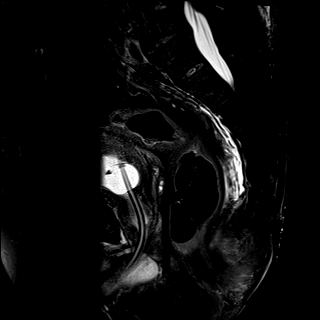
[im 19/25]
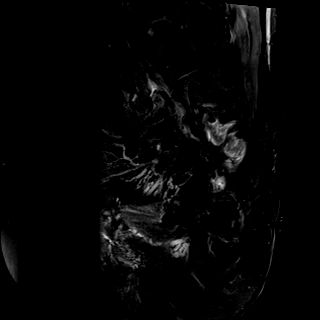
[im 25/25]
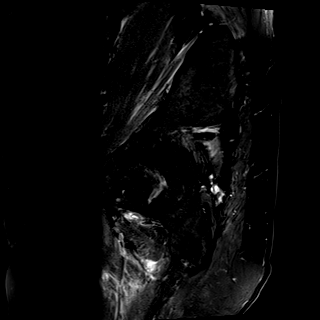

[Series 4: T1 · axial · 4.0mm · 0.43mm/px · z∈[+28,+200]mm · 7 of 35 slices shown]
[im 1/35]
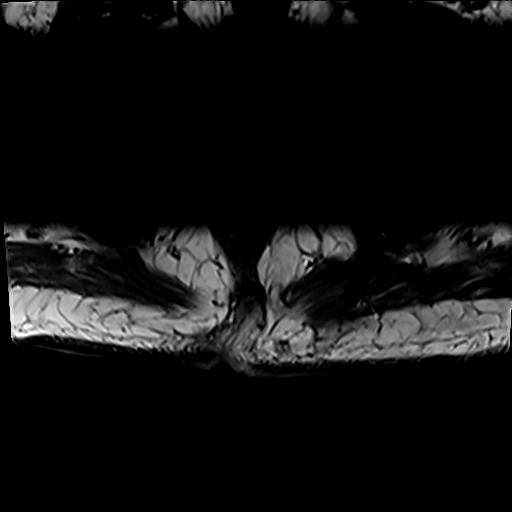
[im 6/35]
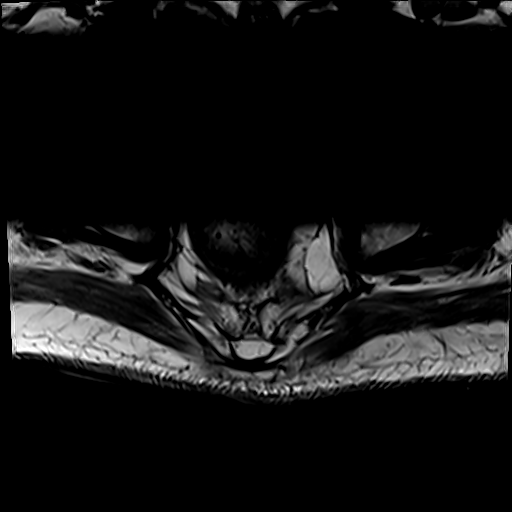
[im 12/35]
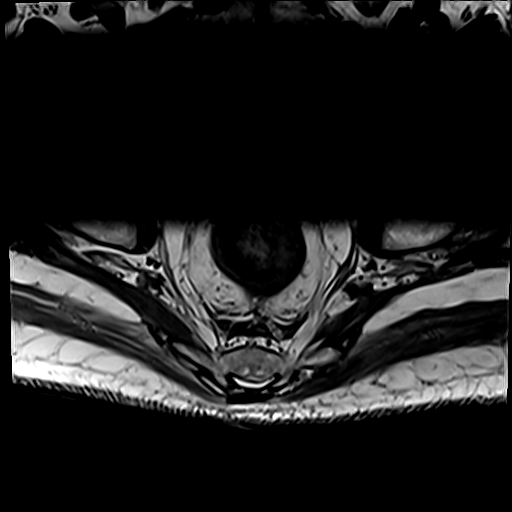
[im 18/35]
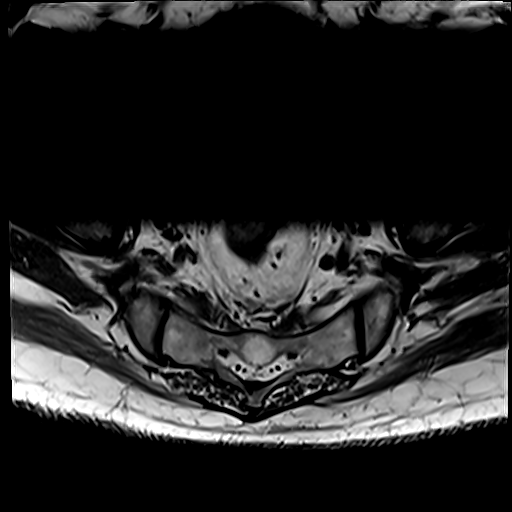
[im 23/35]
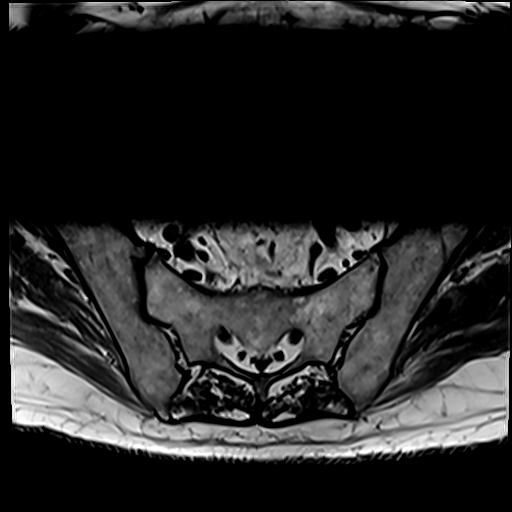
[im 29/35]
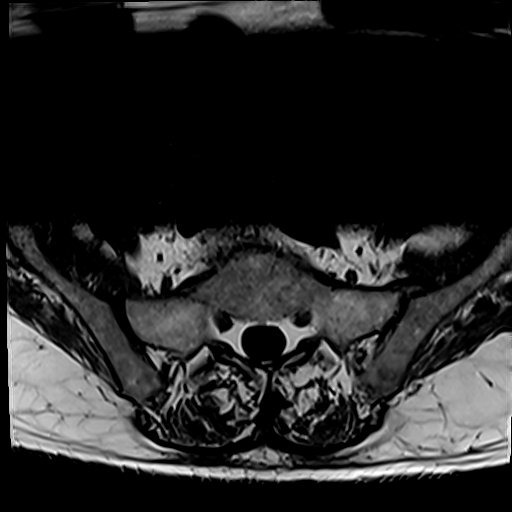
[im 35/35]
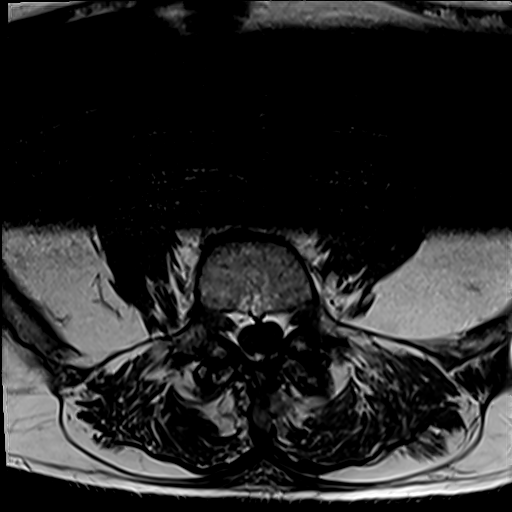

[Series 5: T2 fat-sat · axial · 4.0mm · 0.57mm/px · z∈[+30,+198]mm · 7 of 35 slices shown (2 of 2)]
[im 1/35]
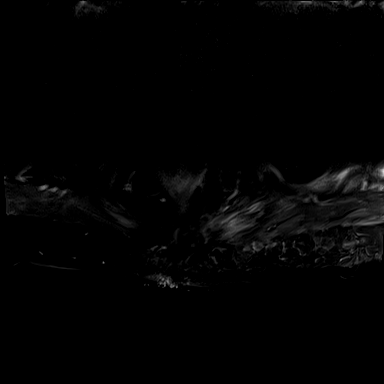
[im 6/35]
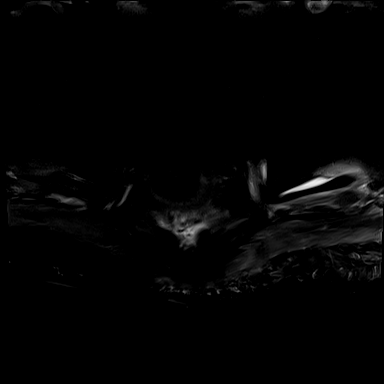
[im 12/35]
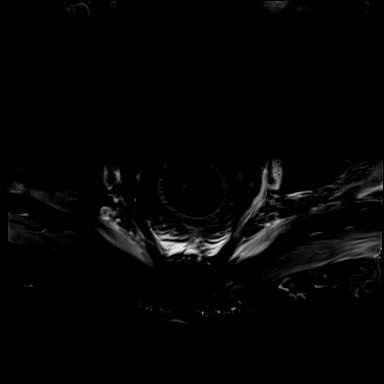
[im 18/35]
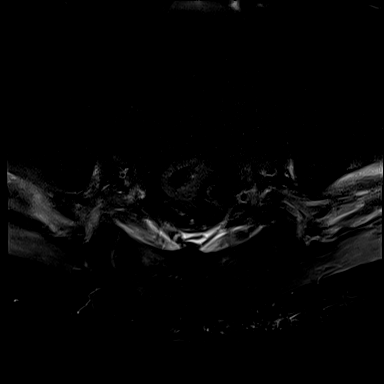
[im 23/35]
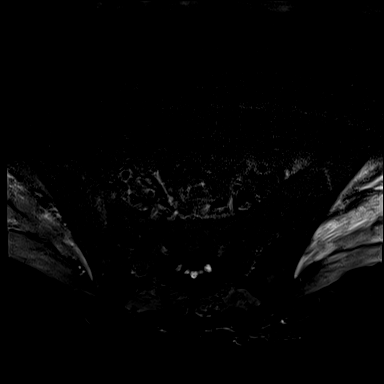
[im 29/35]
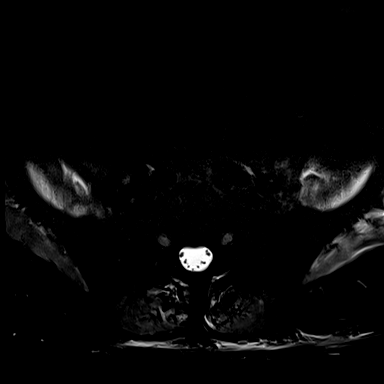
[im 35/35]
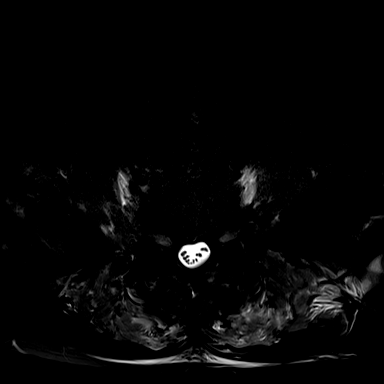

[Series 6: STIR · coronal · 3.0mm · 0.51mm/px · 5 of 27 slices shown]
[im 1/27]
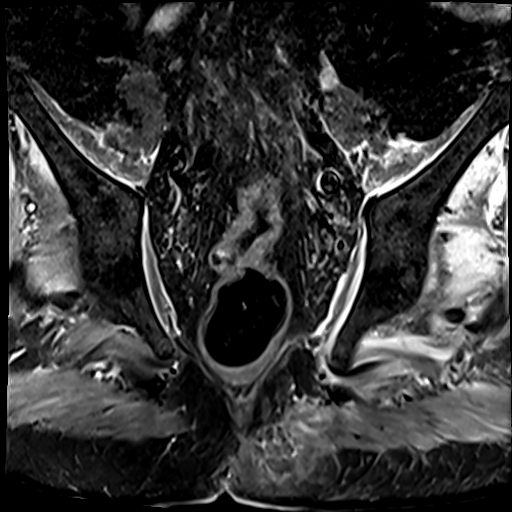
[im 7/27]
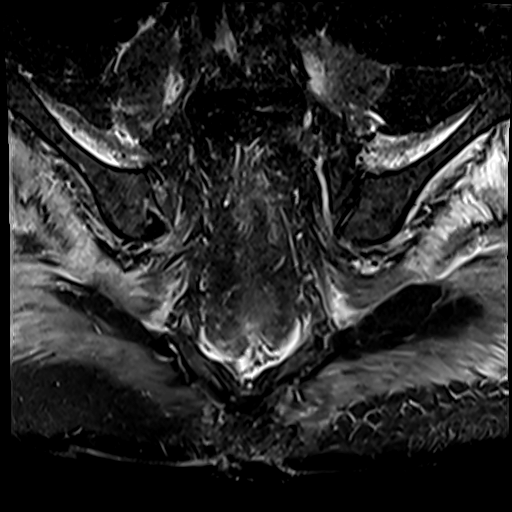
[im 14/27]
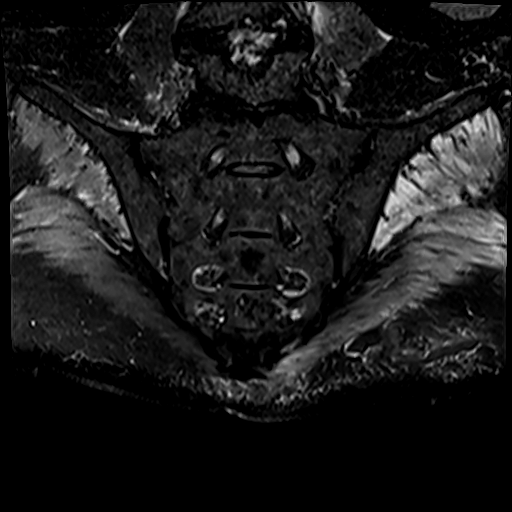
[im 20/27]
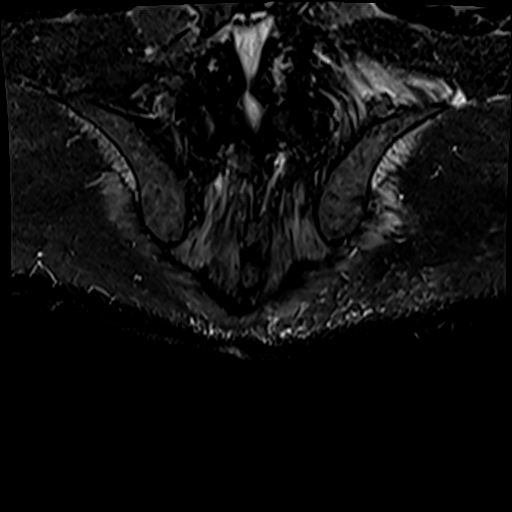
[im 27/27]
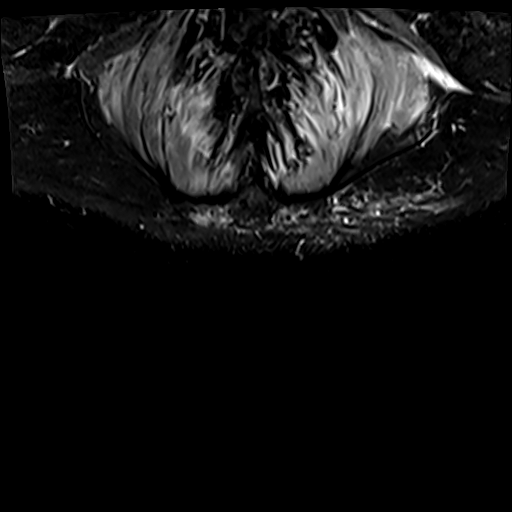

[24 of 48 positions shown; findings below may reference images not displayed]

FINDINGS: Osseous structures: No abnormal osseous edema or enhancement in the
sacrum to indicate active osteomyelitis. No sacral fracture or bony
destructive findings. Small Schmorl's nodes along the superior
endplate of L5 and inferior endplate of L4. Transitional S1
vertebra.

Joints: No SI joint effusion.

Musculotendinous: Abnormal regional edema tracking within along the
distal iloipsoas, gluteal, piriformis, hip adductor, and obturator
internus musculature as well as the lower paraspinal musculature.
Enhancement tracking within and along the left obturator internus
muscle. Abnormal irregular hypoenhancement medially along the
gluteus maximus muscle abdomen below the level of the coccyx, and
tracking in the regional subcutaneous tissues for example on image
35 series 9 and image 6 series 10, corresponding to the abnormal
edema shown on CT. I not currently see an overt walled off fluid
collection on the T2 weighted images but focal infection and
incipient abscess is a possibility. There is some overlying
cutaneous irregularity. No visible gas in the soft tissues.

Other: Foley catheter in the urinary bladder. Presacral edema lower
lumbar degenerative facet arthropathy.
IMPRESSION: 1. Abnormal irregular hypoenhancement medially within the gluteus
maximus muscle and adjacent subcutaneous tissues and extending to
the overlying cutaneous surface favoring focal inflammation and
possibly incipient abscess although a well-defined drainable fluid
collection is not observed on the T2 weighted images. There is some
asymmetric enhancement medially in the left gluteus maximus muscle
and in the left obturator internus muscle.
2. Localized edema within and along regional musculature including
iliopsoas, paraspinal, bilateral hip adductor, gluteal, and
obturator internus musculature bilaterally. This could be neurogenic
but is technically nonspecific.
3. Nonspecific presacral edema.
4. Schmorl's nodes at L4-5.  Transitional S1 vertebra.
5. Foley catheter in the urinary bladder.

## 2020-10-12 MED ORDER — COLLAGENASE 250 UNIT/GM EX OINT
TOPICAL_OINTMENT | Freq: Every day | CUTANEOUS | Status: DC
  Filled 2020-10-12 (×2): qty 30

## 2020-10-12 MED ORDER — ADULT MULTIVITAMIN W/MINERALS CH
1.0000 | ORAL_TABLET | Freq: Every day | ORAL | Status: DC
  Administered 2020-10-12 – 2020-11-01 (×20): 1 via ORAL
  Filled 2020-10-12 (×21): qty 1

## 2020-10-12 MED ORDER — ZINC OXIDE 12.8 % EX OINT
TOPICAL_OINTMENT | Freq: Three times a day (TID) | CUTANEOUS | Status: DC
  Administered 2020-10-26: 1 via TOPICAL
  Filled 2020-10-12 (×2): qty 56.7

## 2020-10-12 MED ORDER — DICLOFENAC SODIUM 1 % EX GEL
2.0000 g | Freq: Four times a day (QID) | CUTANEOUS | Status: DC
  Administered 2020-10-12 – 2020-10-19 (×30): 2 g via TOPICAL
  Filled 2020-10-12: qty 100

## 2020-10-12 MED ORDER — METRONIDAZOLE 500 MG/100ML IV SOLN
500.0000 mg | Freq: Three times a day (TID) | INTRAVENOUS | Status: DC
  Administered 2020-10-12 – 2020-10-13 (×3): 500 mg via INTRAVENOUS
  Filled 2020-10-12 (×3): qty 100

## 2020-10-12 MED ORDER — CHLORHEXIDINE GLUCONATE CLOTH 2 % EX PADS
6.0000 | MEDICATED_PAD | Freq: Every day | CUTANEOUS | Status: DC
  Administered 2020-10-12 – 2020-11-01 (×21): 6 via TOPICAL

## 2020-10-12 MED ORDER — SODIUM CHLORIDE 0.9 % IV SOLN
2.0000 g | Freq: Three times a day (TID) | INTRAVENOUS | Status: DC
  Administered 2020-10-12 – 2020-10-13 (×3): 2 g via INTRAVENOUS
  Filled 2020-10-12 (×3): qty 2

## 2020-10-12 MED ORDER — GADOBUTROL 1 MMOL/ML IV SOLN
7.0000 mL | Freq: Once | INTRAVENOUS | Status: AC | PRN
  Administered 2020-10-12: 7 mL via INTRAVENOUS

## 2020-10-12 MED ORDER — CARVEDILOL 25 MG PO TABS
25.0000 mg | ORAL_TABLET | Freq: Two times a day (BID) | ORAL | Status: DC
  Administered 2020-10-12 – 2020-10-24 (×24): 25 mg via ORAL
  Filled 2020-10-12 (×24): qty 1

## 2020-10-12 MED ORDER — ACETAMINOPHEN 325 MG PO TABS
650.0000 mg | ORAL_TABLET | Freq: Four times a day (QID) | ORAL | Status: DC | PRN
  Administered 2020-10-14 – 2020-10-29 (×3): 650 mg via ORAL
  Filled 2020-10-12 (×3): qty 2

## 2020-10-12 NOTE — ED Notes (Signed)
Breakfast order placed ?

## 2020-10-12 NOTE — Progress Notes (Signed)
PHARMACY - PHYSICIAN COMMUNICATION CRITICAL VALUE ALERT - BLOOD CULTURE IDENTIFICATION (BCID)  Daniel [name] is an 80 y.o. male who presented to [facility] on 10/11/2020 with a chief complaint of L knee pain  Assessment:  1 of 4 blood cultures growing staph epi  Name of physician (or Provider) Contacted: Christell Faith MD  Current antibiotics: vancomycin, cefepime  Changes to prescribed antibiotics recommended:  Could likely stop vancomycin if no other source suspected.  Results for orders placed or performed during the hospital encounter of 10/11/20  Blood Culture ID Panel (Reflexed) (Collected: 10/11/2020  6:56 PM)  Result Value Ref Range   Enterococcus faecalis NOT DETECTED NOT DETECTED   Enterococcus Faecium NOT DETECTED NOT DETECTED   Listeria monocytogenes NOT DETECTED NOT DETECTED   Staphylococcus species DETECTED (A) NOT DETECTED   Staphylococcus aureus (BCID) NOT DETECTED NOT DETECTED   Staphylococcus epidermidis DETECTED (A) NOT DETECTED   Staphylococcus lugdunensis NOT DETECTED NOT DETECTED   Streptococcus species NOT DETECTED NOT DETECTED   Streptococcus agalactiae NOT DETECTED NOT DETECTED   Streptococcus pneumoniae NOT DETECTED NOT DETECTED   Streptococcus pyogenes NOT DETECTED NOT DETECTED   A.calcoaceticus-baumannii NOT DETECTED NOT DETECTED   Bacteroides fragilis NOT DETECTED NOT DETECTED   Enterobacterales NOT DETECTED NOT DETECTED   Enterobacter cloacae complex NOT DETECTED NOT DETECTED   Escherichia coli NOT DETECTED NOT DETECTED   Klebsiella aerogenes NOT DETECTED NOT DETECTED   Klebsiella oxytoca NOT DETECTED NOT DETECTED   Klebsiella pneumoniae NOT DETECTED NOT DETECTED   Proteus species NOT DETECTED NOT DETECTED   Salmonella species NOT DETECTED NOT DETECTED   Serratia marcescens NOT DETECTED NOT DETECTED   Haemophilus influenzae NOT DETECTED NOT DETECTED   Neisseria meningitidis NOT DETECTED NOT DETECTED   Pseudomonas aeruginosa NOT  DETECTED NOT DETECTED   Stenotrophomonas maltophilia NOT DETECTED NOT DETECTED   Candida albicans NOT DETECTED NOT DETECTED   Candida auris NOT DETECTED NOT DETECTED   Candida glabrata NOT DETECTED NOT DETECTED   Candida krusei NOT DETECTED NOT DETECTED   Candida parapsilosis NOT DETECTED NOT DETECTED   Candida tropicalis NOT DETECTED NOT DETECTED   Cryptococcus neoformans/gattii NOT DETECTED NOT DETECTED   Methicillin resistance mecA/C DETECTED (A) NOT DETECTED     Uvaldo Rising, BCPS, BCCP Clinical Pharmacist  10/12/2020 5:14 PM   [facility] pharmacy phone numbers are listed on [email or web address]

## 2020-10-12 NOTE — Consult Note (Signed)
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 irregular hypoenhancement medially along the gluteus maximus muscle abdomen below the level of the coccyx, and tracking in the regional subcutaneous tissues for example on image 35 series 9 and image 6 series 10, corresponding to the abnormal edema shown on CT. I not currently see an overt walled off fluid collection on the T2 weighted images but focal infection and incipient abscess is a possibility. There is some overlying cutaneous irregularity. No visible gas in the soft tissues. Other: Foley catheter in the urinary bladder. Presacral edema lower lumbar degenerative facet arthropathy. IMPRESSION: 1. Abnormal irregular hypoenhancement medially within the gluteus maximus muscle and adjacent subcutaneous tissues and extending to the overlying cutaneous surface favoring focal inflammation and possibly incipient abscess although a  well-defined drainable fluid collection is not observed on the T2 weighted images. There is some asymmetric enhancement medially in the left gluteus maximus muscle and in the left obturator internus muscle. 2. Localized edema within and along regional musculature including iliopsoas, paraspinal, bilateral hip adductor, gluteal, and obturator internus musculature bilaterally. This could be neurogenic but is technically nonspecific. 3. Nonspecific presacral edema. 4. Schmorl's nodes at L4-5.  Transitional S1 vertebra. 5. Foley catheter in the urinary bladder. Electronically Signed   By: Van Clines M.D.   On: 10/12/2020 08:08   DG Knee Left Port  Result Date: 10/11/2020 CLINICAL DATA:  Bilateral knee pain, no known injury EXAM: PORTABLE LEFT KNEE - 1-2 VIEW; PORTABLE RIGHT KNEE - 1-2 VIEW COMPARISON:  None. FINDINGS: No fracture or dislocation of the bilateral knees. Mild patellofemoral compartment arthrosis bilaterally preserved medial and lateral compartments. No knee joint effusion. Soft tissues are unremarkable. IMPRESSION: No fracture or dislocation of the bilateral knees. Mild patellofemoral compartment arthrosis bilaterally. No knee joint effusion. Electronically Signed   By: Eddie Candle M.D.   On: 10/11/2020 16:12   DG Knee Right Port  Result Date: 10/11/2020 CLINICAL DATA:  Bilateral knee pain, no known injury EXAM: PORTABLE LEFT KNEE - 1-2 VIEW; PORTABLE RIGHT KNEE - 1-2 VIEW COMPARISON:  None. FINDINGS: No fracture or dislocation of the bilateral knees. Mild patellofemoral compartment arthrosis bilaterally preserved medial and lateral compartments. No knee joint effusion. Soft tissues are unremarkable. IMPRESSION: No fracture or dislocation of the  bilateral knees. Mild patellofemoral compartment arthrosis bilaterally. No knee joint effusion. Electronically Signed   By: Eddie Candle M.D.   On: 10/11/2020 16:12      Assessment/Plan Possible UTI L knee pain and swelling Atrial  fibrillation  HTN Normocytic Anemia  Hypothyroidism  BPH Left heel pressure injuries, unstageable Dysphagia - by report not using his g-tube.  This can be removed per primary service if he isn't going to be using it any further. Physical deconditioning  Chronic venous insufficiency  - Above per TRH -   Sacral decubitus pressure ulcer, unstageable  - MRI/CT with presacral edema, generalized inflammation  - there no evidence of infection of this wound.  He has a tight eschar noted over the central aspect of this wound.  Agree with WOC assessment.  No plans for surgical debridement.  Start PT hydrotherapy, santyl, along with BID dressing changes for debridement. -we will re-eval on Friday, but no plans for anything further from our standpoint at this time.  FEN: DYS 1 diet  VTE: SCDs, therapeutic lovenox (115 mg BID) ID: Zosyn 5/30; Vanc 5/30>>, cefepime/flagyl 5/31>>  Henreitta Cea, [facility] 10/12/2020, 12:19 PM Please see Amion for pager number during day hours 7:00am-4:30pm
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
## 2020-10-12 NOTE — ED Notes (Signed)
Blood draw unsuccessful-KS

## 2020-10-12 NOTE — ED Notes (Signed)
Pt tx to MRI
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
## 2020-10-12 NOTE — ED Notes (Signed)
Pt diff stick; lab draw unsuccessful. Lab unsuccessful earlier in night.

## 2020-10-12 NOTE — Progress Notes (Signed)
Pharmacy Antibiotic Note  Daniel [name] is a 80 y.o. male admitted on 10/11/2020 with left knee pain and sacral wound, found to have sacral decub ulcer infection, possible L knee septic arthritis and possible abscess.  Pharmacy has been consulted to switch Zosyn to cefepime and Flagyl, and continue vancomycin.  Renal function stable, Tmax 100.6, WBC WNL, PCT negative, UA unimpressive.  Plan: Continue vanc 1gm IV Q12H for AUC 502 using SCr 0.8 Cefepime 2gm IV Q8H Flagyl 500mg  IV Q8H per MD Monitor renal fxn, clinical progress, vanc levels as indicated  Height: 5\' 10"  (177.8 cm) Weight: 116.2 kg (256 lb 2.8 oz) IBW/kg (Calculated) : 73  Temp (24hrs), Avg:99.5 F (37.5 C), Min:98 F (36.7 C), Max:100.9 F (38.3 C)  Recent Labs  Lab 10/11/20 1543 10/11/20 1843 10/11/20 2309 10/12/20 0950  WBC 12.7*  --   --  9.9  CREATININE 0.80  --   --  0.83  LATICACIDVEN 2.3* 2.7* 1.3  --     Estimated Creatinine Clearance: 92.2 mL/min (by C-G formula based on SCr of 0.83 mg/dL).    No Known Allergies  Vanc 5/30 >> Zosyn 5/30 >> 5/31 Cefepime 5/31 >> Flagyl 5/31 >>   5/30 left knee synovial fluid -  5/30 BCx -  5/31 UCx -   Yichen Gilardi D. Mina Marble, PharmD, BCPS, [place] 10/12/2020, 11:09 AM

## 2020-10-12 NOTE — Consult Note (Signed)
WOC Nurse Consult Note: Patient receiving care in [facility] 5N09. Reason for Consult: left heel and sacral wounds Wound type: Left heel has heavy dry, cracking, flaking skin.  For this area I have ordered application of iodine each shift and placement of the feet into Prevalon heel lift boots. The left buttock/sacrum has an UNstageable PI with a central area of non-viable tissue that is grey in color, surrounded by pink tissue. For this wound I have requested PT to perform hydrotherapy 6 days/week with application of Santyl and saline moistened gauze daily. The right buttock has scattered areas of tissue impairment that is likely related to not only pressure, but incontinence. For this area I have ordered Triple Paste. Left upper back has a skin tear and for this I have ordered Xeroform gauze and foam dressing. To further reduce pressure to the patient's bony prominences, I have ordered a standard size bed with air mattress. Pressure Injury POA: Yes Measurement: To be provided by the bedside RN in the flowsheet section Wound bed: see photos Drainage (amount, consistency, odor)  Periwound: Dressing procedure/placement/frequency: as above. Thank you for the consult. [facility] nurse will not follow at this time.  Please re-consult the [facility] team if needed.  Val Riles, RN, MSN, CWOCN, CNS-BC, pager [phone]
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
## 2020-10-12 NOTE — ED Notes (Signed)
Attempted report x1. 

## 2020-10-13 ENCOUNTER — Inpatient Hospital Stay (HOSPITAL_COMMUNITY): Payer: Medicare Other

## 2020-10-13 DIAGNOSIS — A419 Sepsis, unspecified organism: Secondary | ICD-10-CM | POA: Diagnosis not present

## 2020-10-13 DIAGNOSIS — M25562 Pain in left knee: Secondary | ICD-10-CM | POA: Diagnosis not present

## 2020-10-13 DIAGNOSIS — R6520 Severe sepsis without septic shock: Secondary | ICD-10-CM | POA: Diagnosis not present

## 2020-10-13 DIAGNOSIS — R7881 Bacteremia: Secondary | ICD-10-CM | POA: Diagnosis not present

## 2020-10-13 DIAGNOSIS — I4891 Unspecified atrial fibrillation: Secondary | ICD-10-CM

## 2020-10-13 LAB — CBC WITH DIFFERENTIAL/PLATELET
Abs Immature Granulocytes: 0.03 10*3/uL (ref 0.00–0.07)
Basophils Absolute: 0 10*3/uL (ref 0.0–0.1)
Basophils Relative: 0 %
Eosinophils Absolute: 0.1 10*3/uL (ref 0.0–0.5)
Eosinophils Relative: 1 %
HCT: 33.4 % — ABNORMAL LOW (ref 39.0–52.0)
Hemoglobin: 10 g/dL — ABNORMAL LOW (ref 13.0–17.0)
Immature Granulocytes: 0 %
Lymphocytes Relative: 20 %
Lymphs Abs: 1.8 10*3/uL (ref 0.7–4.0)
MCH: 25.6 pg — ABNORMAL LOW (ref 26.0–34.0)
MCHC: 29.9 g/dL — ABNORMAL LOW (ref 30.0–36.0)
MCV: 85.4 fL (ref 80.0–100.0)
Monocytes Absolute: 1 10*3/uL (ref 0.1–1.0)
Monocytes Relative: 11 %
Neutro Abs: 6.2 10*3/uL (ref 1.7–7.7)
Neutrophils Relative %: 68 %
Platelets: 239 10*3/uL (ref 150–400)
RBC: 3.91 MIL/uL — ABNORMAL LOW (ref 4.22–5.81)
RDW: 16 % — ABNORMAL HIGH (ref 11.5–15.5)
WBC: 9.2 10*3/uL (ref 4.0–10.5)
nRBC: 0 % (ref 0.0–0.2)

## 2020-10-13 LAB — GRAM STAIN

## 2020-10-13 LAB — ECHOCARDIOGRAM LIMITED
Height: 70 in
S' Lateral: 2.7 cm
Weight: 4098.79 oz

## 2020-10-13 LAB — BASIC METABOLIC PANEL
Anion gap: 10 (ref 5–15)
BUN: 9 mg/dL (ref 8–23)
CO2: 25 mmol/L (ref 22–32)
Calcium: 8.6 mg/dL — ABNORMAL LOW (ref 8.9–10.3)
Chloride: 103 mmol/L (ref 98–111)
Creatinine, Ser: 0.78 mg/dL (ref 0.61–1.24)
GFR, Estimated: >60 mL/min (ref >60)
Glucose, Bld: 93 mg/dL (ref 70–99)
Potassium: 3 mmol/L — ABNORMAL LOW (ref 3.5–5.1)
Sodium: 138 mmol/L (ref 135–145)

## 2020-10-13 LAB — URIC ACID, BODY FLUID: Uric Acid Body Fluid: 6.2 mg/dL

## 2020-10-13 LAB — GLUCOSE, BODY FLUID OTHER: Glucose, Body Fluid Other: 106 mg/dL

## 2020-10-13 LAB — PROTEIN, BODY FLUID (OTHER): Total Protein, Body Fluid Other: 3.7 g/dL

## 2020-10-13 MED ORDER — METRONIDAZOLE 500 MG/100ML IV SOLN
500.0000 mg | Freq: Three times a day (TID) | INTRAVENOUS | Status: DC
  Administered 2020-10-13 – 2020-10-16 (×8): 500 mg via INTRAVENOUS
  Filled 2020-10-13 (×8): qty 100

## 2020-10-13 MED ORDER — APIXABAN 5 MG PO TABS
5.0000 mg | ORAL_TABLET | Freq: Two times a day (BID) | ORAL | Status: DC
  Administered 2020-10-14 – 2020-10-22 (×18): 5 mg via ORAL
  Filled 2020-10-13 (×17): qty 1

## 2020-10-13 MED ORDER — TAMSULOSIN HCL 0.4 MG PO CAPS
0.4000 mg | ORAL_CAPSULE | Freq: Every day | ORAL | Status: DC
  Administered 2020-10-13 – 2020-11-01 (×19): 0.4 mg via ORAL
  Filled 2020-10-13 (×19): qty 1

## 2020-10-13 MED ORDER — POTASSIUM CHLORIDE 20 MEQ PO PACK
40.0000 meq | PACK | Freq: Three times a day (TID) | ORAL | Status: AC
  Administered 2020-10-13 (×2): 40 meq via ORAL
  Filled 2020-10-13 (×2): qty 2

## 2020-10-13 MED ORDER — HYDROCODONE-ACETAMINOPHEN 5-325 MG PO TABS
1.0000 | ORAL_TABLET | Freq: Four times a day (QID) | ORAL | Status: DC
  Administered 2020-10-13 – 2020-10-25 (×47): 1 via ORAL
  Filled 2020-10-13 (×46): qty 1

## 2020-10-13 MED ORDER — SODIUM CHLORIDE 0.9 % IV SOLN
2.0000 g | Freq: Three times a day (TID) | INTRAVENOUS | Status: DC
  Administered 2020-10-13 – 2020-10-16 (×8): 2 g via INTRAVENOUS
  Filled 2020-10-13 (×9): qty 2

## 2020-10-13 MED ORDER — FOOD THICKENER (SIMPLYTHICK)
1.0000 | ORAL | Status: DC | PRN
  Filled 2020-10-13: qty 1

## 2020-10-13 NOTE — Progress Notes (Signed)
  Echocardiogram 2D Echocardiogram has been performed.  Daniel [name] 10/13/2020, 12:03 PM

## 2020-10-13 NOTE — Progress Notes (Signed)
Bedside shift report complete. Received patient awake,alert/orientedx4 and able to verbalize needs. NAD noted; respirations even on room air. Right sided weakness noted. PEG tube noted to RUQ; clamped at this time. Foley in place c/d/i. Some movement to extremities noted. Whiteboard updated. All safety measures in place and personal belongings within reach.
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
## 2020-10-13 NOTE — Progress Notes (Signed)
Physical Therapy Wound Evaluation/Treatment Patient Details  Name: Daniel [name] MRN: [id number] Date of Birth: [birth year]  Today's Date: 10/13/2020 Time: 1155-1228 Time Calculation (min): 33 min  Subjective  Subjective Assessment Subjective: Pt pleasant and agreeable to hydrotherapy Patient and Family Stated Goals: None stated Date of Onset:  (Unknown) Prior Treatments:  (Dressing changes)  Pain Score:  Pt appears to be mildly painful during repositioning but tolerated treatment well without complaints of pain.   Wound Assessment  Pressure Injury 10/12/20 Sacrum Unstageable - Full thickness tissue loss in which the base of the injury is covered by slough (yellow, tan, gray, green or brown) and/or eschar (tan, brown or black) in the wound bed. (Active)  Wound Image   10/13/20 1424  Dressing Type ABD;Barrier Film (skin prep);Gauze (Comment);Moist to moist;Santyl 10/13/20 1424  Dressing Changed;Clean;Dry;Intact 10/13/20 1424  Dressing Change Frequency Daily 10/13/20 1424  State of Healing Eschar 10/13/20 1424  Site / Wound Assessment Pink;Yellow;Black 10/13/20 1424  % Wound base Red or Granulating 20% 10/13/20 1424  % Wound base Yellow/Fibrinous Exudate 5% 10/13/20 1424  % Wound base Black/Eschar 75% 10/13/20 1424  % Wound base Other/Granulation Tissue (Comment) 0% 10/13/20 1424  Peri-wound Assessment Intact;Maceration 10/13/20 1424  Wound Length (cm) 4 cm 10/13/20 1200  Wound Width (cm) 5 cm 10/13/20 1200  Wound Depth (cm) 0.1 cm 10/13/20 1200  Wound Surface Area (cm^2) 20 cm^2 10/13/20 1200  Wound Volume (cm^3) 2 cm^3 10/13/20 1200  Tunneling (cm) 0 10/13/20 1424  Undermining (cm) 0 10/13/20 1424  Margins Unattached edges (unapproximated) 10/13/20 1424  Drainage Amount Scant 10/13/20 1424  Treatment Debridement (Selective);Hydrotherapy (Pulse lavage);Packing (Saline gauze) 10/13/20 1424      Hydrotherapy Pulsed lavage therapy - wound location: Left buttock/sacrum Pulsed  Lavage with Suction (psi): 12 psi Pulsed Lavage with Suction - Normal Saline Used: 1000 mL Pulsed Lavage Tip: Tip with splash shield Selective Debridement Selective Debridement - Location: Left buttock/sacrum Selective Debridement - Tools Used: Forceps,Scalpel Selective Debridement - Tissue Removed: Eschar    Wound Assessment and Plan  Wound Therapy - Assess/Plan/Recommendations Wound Therapy - Clinical Statement: Pt presents to hydrotherapy with an unstagable pressure injury to the L buttock/sacrum. Debridement able to be initiated today, and anticipate dimensions of wound will increase as wound evolves and necrotic tissue is removed. This patient will benefit from continued hydrotherapy for selective removal of unviable tissue, to decrease bioburden, and promote wound bed healing. Wound Therapy - Functional Problem List: Decreased overall mobility and tolerance for sitting OOB Factors Delaying/Impairing Wound Healing: Infection - systemic/local,Immobility Hydrotherapy Plan: Debridement,Dressing change,Patient/family education,Pulsatile lavage with suction Wound Therapy - Frequency: 6X / week Wound Therapy - Follow Up Recommendations: dressing changes by family/patient  Wound Therapy Goals- Improve the function of patient's integumentary system by progressing the wound(s) through the phases of wound healing (inflammation - proliferation - remodeling) by: Wound Therapy Goals - Improve the function of patient's integumentary system by progressing the wound(s) through the phases of wound healing by: Decrease Necrotic Tissue to: 0 Decrease Necrotic Tissue - Progress: Goal set today Increase Granulation Tissue to: 100 Increase Granulation Tissue - Progress: Goal set today Goals/treatment plan/discharge plan were made with and agreed upon by patient/family: Yes Time For Goal Achievement: 7 days Wound Therapy - Potential for Goals: Good  Goals will be updated until maximal potential achieved  or discharge criteria met.  Discharge criteria: when goals achieved, discharge from hospital, MD decision/surgical intervention, no progress towards goals, refusal/missing three consecutive treatments without notification or medical  reason.  GP     Charges PT Wound Care Charges $Wound Debridement up to 20 cm: < or equal to 20 cm $PT PLS Gun and Tip: 1 Supply $PT Hydrotherapy Visit: 1 Visit       [name] 10/13/2020, 2:35 PM   Rolinda Roan, PT, DPT Acute Rehabilitation Services Pager: [phone] Office: [phone]

## 2020-10-13 NOTE — Progress Notes (Signed)
Pharmacy Antibiotic Note  Daniel [name] is a 80 y.o. male admitted on 10/11/2020 with L knee pain and sacral wound and found to have sacral decub ulcer infection, possible L knee septic arthritis and possible abscess. Pharmacy was consulted for vancomycin and Zosyn dosing; Zosyn was changed to cefepime/metronidazole; cefepime/metronidazole were discontinued earlier today. Pharmacy has been re-consulted for cefepime dosing.  WBC 9.2, afebrile; Scr 0.78, CrCl 95.6 ml/min (renal function stable)  Plan: Cefepime 2 gm IV Q 8 hrs Continue vancomycin 1 gm IV Q 12 hrs (estimated AUC, using Scr 0.8, is 502; goal AUC is 400-550) Metronidazole 500 mg IV Q 8 hrs per MD Monitor WBC, temp, clinical improvement, renal function, vancomycin levels as indicated  Height: 5\' 10"  (177.8 cm) Weight: 116.2 kg (256 lb 2.8 oz) IBW/kg (Calculated) : 73  Temp (24hrs), Avg:98.3 F (36.8 C), Min:97.8 F (36.6 C), Max:98.8 F (37.1 C)  Recent Labs  Lab 10/11/20 1543 10/11/20 1843 10/11/20 2309 10/12/20 0950 10/13/20 0200  WBC 12.7*  --   --  9.9 9.2  CREATININE 0.80  --   --  0.83 0.78  LATICACIDVEN 2.3* 2.7* 1.3  --   --     Estimated Creatinine Clearance: 95.6 mL/min (by C-G formula based on SCr of 0.78 mg/dL).    No Known Allergies  Antimicrobials this admission: Vancomycin 5/30 >> Zosyn 5/30-5/31 Metronidazole IV 5/31 >> Cefepime 5/31 >>  Microbiology results: 5/30 BCx: 3/4 btls with MRSE 5/30 UCx: pending 5/31 UCx: pending 5/30 L knee synovial fluid: NG at 2 days  Thank you for allowing pharmacy to be a part of this patient's care.  Gillermina Hu, PharmD, BCPS, [facility] Clinical Pharmacist 10/13/2020 6:17 PM
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
## 2020-10-13 NOTE — Plan of Care (Signed)
  Problem: Education: Goal: Knowledge of General Education information will improve Description: Including pain rating scale, medication(s)/side effects and non-pharmacologic comfort measures Outcome: Progressing   Problem: Health Behavior/Discharge Planning: Goal: Ability to manage health-related needs will improve Outcome: Progressing   Problem: Clinical Measurements: Goal: Ability to maintain clinical measurements within normal limits will improve Outcome: Progressing Goal: Will remain free from infection Outcome: Progressing Goal: Diagnostic test results will improve Outcome: Progressing Goal: Respiratory complications will improve Outcome: Progressing Goal: Cardiovascular complication will be avoided Outcome: Progressing   Problem: Nutrition: Goal: Adequate nutrition will be maintained Outcome: Progressing   Problem: Activity: Goal: Risk for activity intolerance will decrease Outcome: Progressing   Problem: Elimination: Goal: Will not experience complications related to bowel motility Outcome: Progressing Goal: Will not experience complications related to urinary retention Outcome: Progressing   Problem: Pain Managment: Goal: General experience of comfort will improve Outcome: Progressing   
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
## 2020-10-14 ENCOUNTER — Other Ambulatory Visit (HOSPITAL_COMMUNITY): Payer: Self-pay

## 2020-10-14 ENCOUNTER — Inpatient Hospital Stay (HOSPITAL_COMMUNITY): Payer: Medicare Other

## 2020-10-14 DIAGNOSIS — A419 Sepsis, unspecified organism: Secondary | ICD-10-CM | POA: Diagnosis not present

## 2020-10-14 DIAGNOSIS — R6520 Severe sepsis without septic shock: Secondary | ICD-10-CM | POA: Diagnosis not present

## 2020-10-14 LAB — CULTURE, BLOOD (ROUTINE X 2): Special Requests: ADEQUATE

## 2020-10-14 LAB — URINE CULTURE
Culture: >=100000 — AB
Culture: 60000 — AB

## 2020-10-14 LAB — BASIC METABOLIC PANEL
Anion gap: 7 (ref 5–15)
BUN: 9 mg/dL (ref 8–23)
CO2: 25 mmol/L (ref 22–32)
Calcium: 8.5 mg/dL — ABNORMAL LOW (ref 8.9–10.3)
Chloride: 106 mmol/L (ref 98–111)
Creatinine, Ser: 0.74 mg/dL (ref 0.61–1.24)
GFR, Estimated: >60 mL/min (ref >60)
Glucose, Bld: 88 mg/dL (ref 70–99)
Potassium: 3.5 mmol/L (ref 3.5–5.1)
Sodium: 138 mmol/L (ref 135–145)

## 2020-10-14 LAB — CBC WITH DIFFERENTIAL/PLATELET
Abs Immature Granulocytes: 0.02 10*3/uL (ref 0.00–0.07)
Basophils Absolute: 0 10*3/uL (ref 0.0–0.1)
Basophils Relative: 0 %
Eosinophils Absolute: 0.2 10*3/uL (ref 0.0–0.5)
Eosinophils Relative: 2 %
HCT: 32.4 % — ABNORMAL LOW (ref 39.0–52.0)
Hemoglobin: 9.7 g/dL — ABNORMAL LOW (ref 13.0–17.0)
Immature Granulocytes: 0 %
Lymphocytes Relative: 21 %
Lymphs Abs: 1.6 10*3/uL (ref 0.7–4.0)
MCH: 25.5 pg — ABNORMAL LOW (ref 26.0–34.0)
MCHC: 29.9 g/dL — ABNORMAL LOW (ref 30.0–36.0)
MCV: 85 fL (ref 80.0–100.0)
Monocytes Absolute: 0.8 10*3/uL (ref 0.1–1.0)
Monocytes Relative: 11 %
Neutro Abs: 5 10*3/uL (ref 1.7–7.7)
Neutrophils Relative %: 66 %
Platelets: 212 10*3/uL (ref 150–400)
RBC: 3.81 MIL/uL — ABNORMAL LOW (ref 4.22–5.81)
RDW: 15.9 % — ABNORMAL HIGH (ref 11.5–15.5)
WBC: 7.7 10*3/uL (ref 4.0–10.5)
nRBC: 0 % (ref 0.0–0.2)

## 2020-10-14 IMAGING — MR MR KNEE*L* WO/W CM
4 of 9 series · 10 of 40 positions shown · IV contrast (gadavist)
Comparison: Radiographs [DATE]

CLINICAL DATA: Chronic knee pain with subacute exacerbation.
Reported arthrocentesis negative for infection or crystals.
Recurrent effusion after 2 days.

EXAM:
MRI OF THE LEFT KNEE WITHOUT AND WITH CONTRAST
TECHNIQUE: Multiplanar, multisequence MR imaging of the left knee was performed
both before and after administration of intravenous contrast.
CONTRAST:  10mL GADAVIST GADOBUTROL 1 MMOL/ML IV SOLN

[Series 5: T2 fat-sat · axial · 4.0mm · 0.31mm/px · z∈[-105,+80]mm · 3 of 38 slices shown (1 of 2)]
[im 1/38]
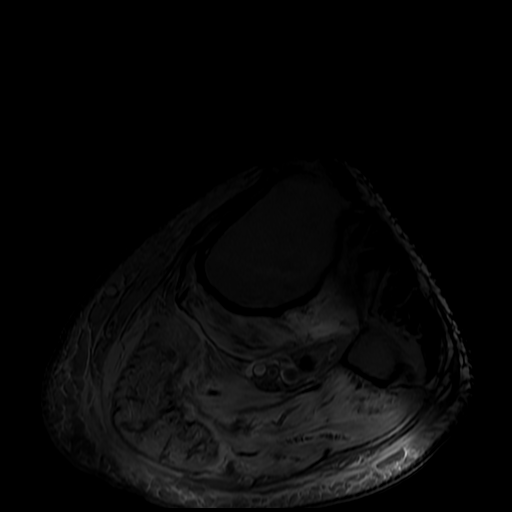
[im 25/38]
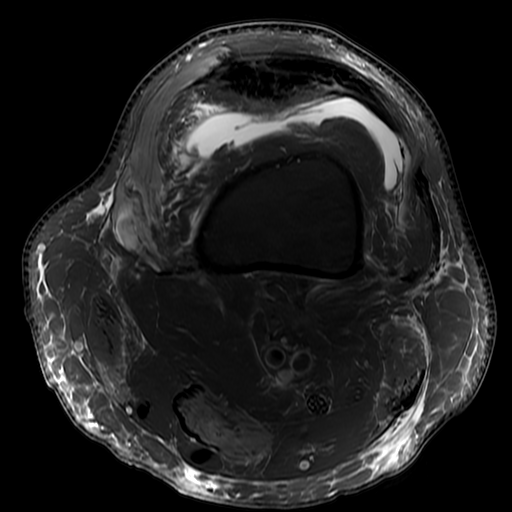
[im 38/38]
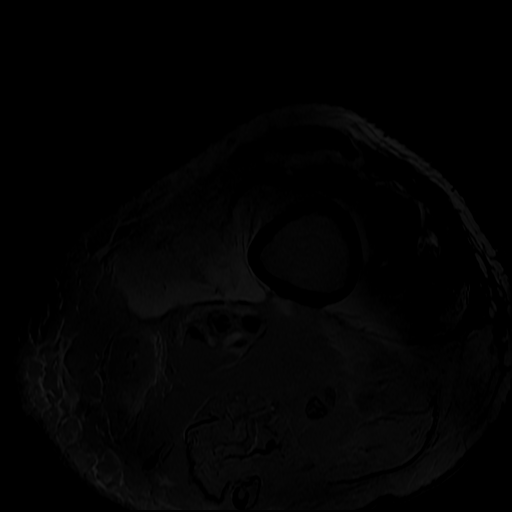

[Series 7: PD fat-sat · coronal · 4.0mm · 0.16mm/px · 3 of 33 slices shown (1 of 2)]
[im 1/33]
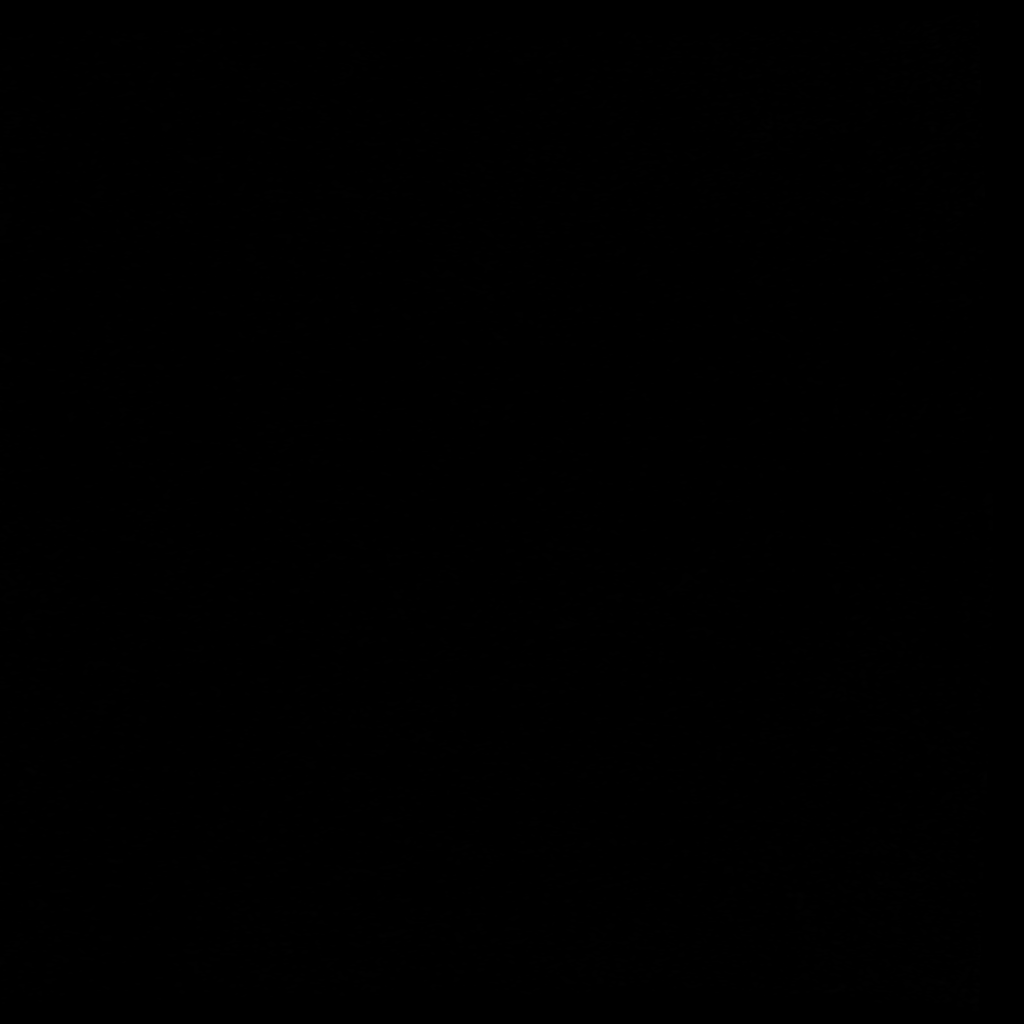
[im 22/33]
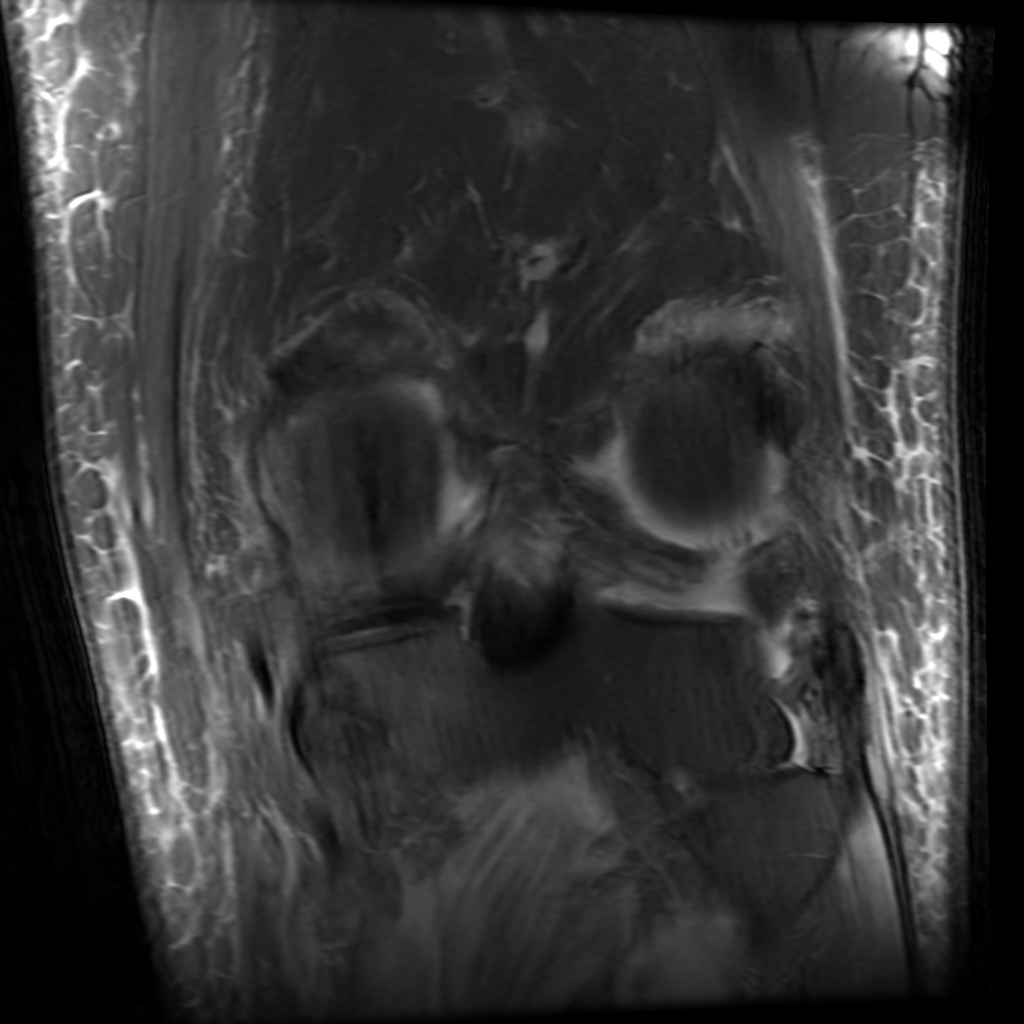
[im 33/33]
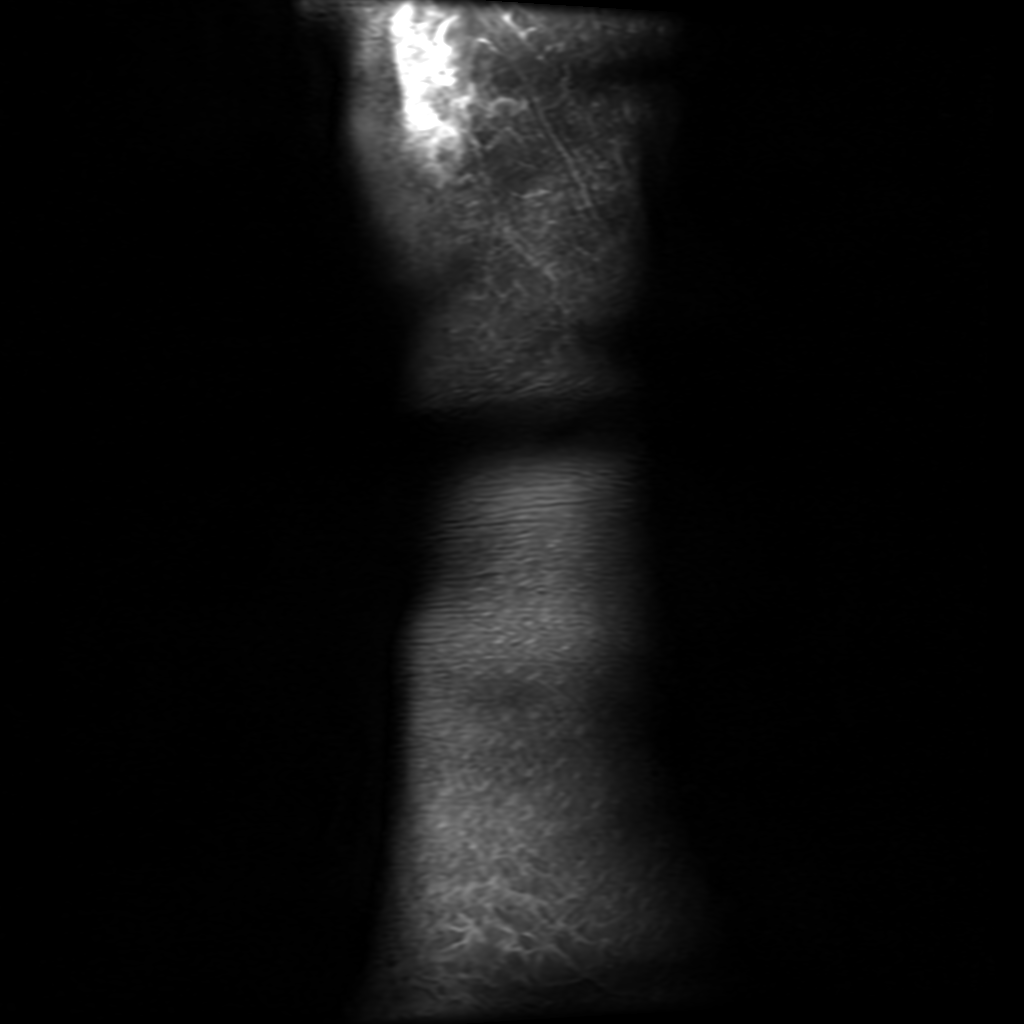

[Series 8: PD fat-sat · sagittal · 3.0mm · 0.31mm/px · 3 of 44 slices shown (2 of 2)]
[im 1/44]
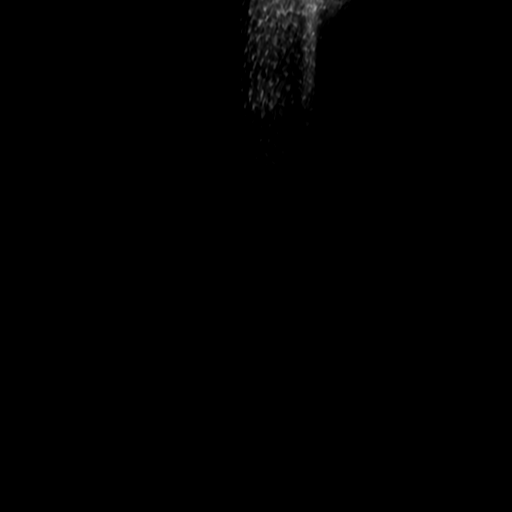
[im 22/44]
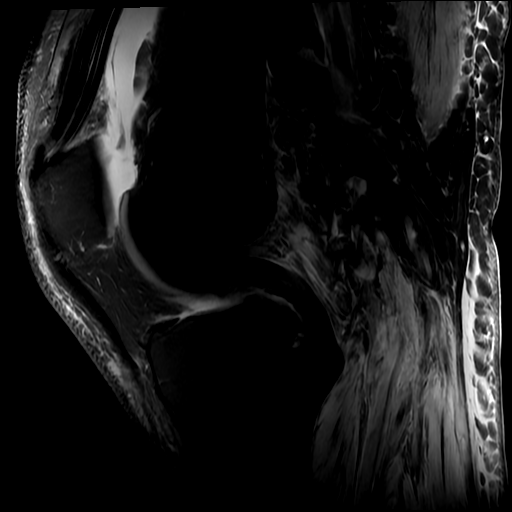
[im 44/44]
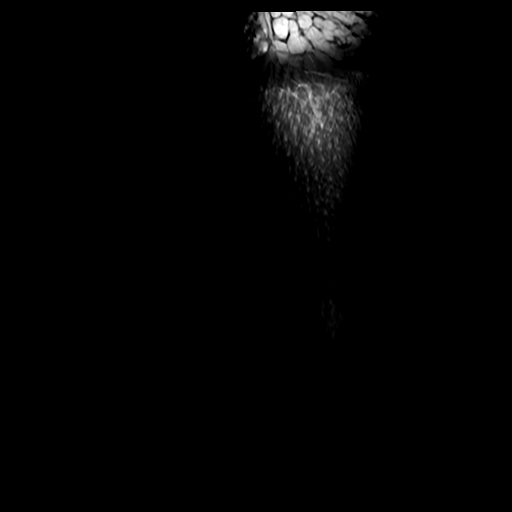

[Series 9: T2 fat-sat · sagittal · 3.0mm · 0.31mm/px · 1 of 43 slices shown (2 of 2)]
[im 1/43]
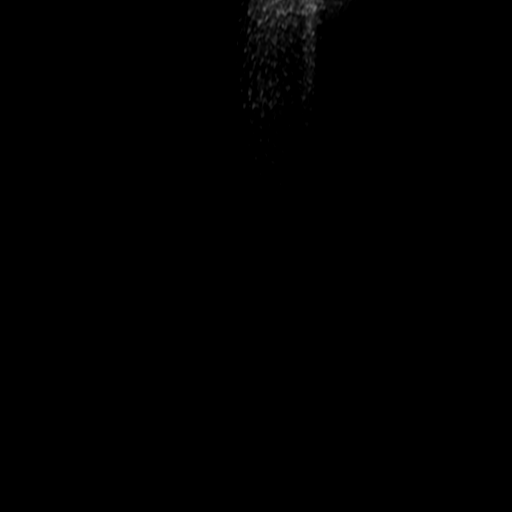

[10 of 40 positions shown; findings below may reference images not displayed]

FINDINGS: MENISCI

Medial meniscus: Diffuse degenerative tearing of the posterior horn
and body. There is a small meniscal flap fragment peripherally in
the meniscotibial recess, best seen on the coronal images. No
centrally displaced meniscal fragment identified. The meniscal root
is intact.

Lateral meniscus:  Intact with normal morphology.

LIGAMENTS

Cruciates:  Intact.

Collaterals: Intact. The medial collateral ligament is mildly
thickened, possibly due to remote injury.

CARTILAGE

Patellofemoral:  Preserved.

Medial:  Minimal chondral thinning without focal defect.

Lateral:  Minimal chondral thinning without focal defect.

MISCELLANEOUS

Joint: Moderate-sized knee joint effusion with mild diffuse synovial
enhancement following contrast. No intra-articular loose bodies
identified.

Popliteal Fossa:  Unremarkable. No significant Baker's cyst.

Extensor Mechanism: Intact. There is prominent spurring at the
quadriceps insertion on the patella.

Bones: No acute or significant extra-articular osseous findings.
There is mild reactive edema peripherally in the medial femoral
condyle and medial tibial plateau. No evidence of cortical
destruction or erosive change.

Other: Mild generalized subcutaneous edema surrounding the knee,
greatest posteriorly. No focal fluid collection.
IMPRESSION: 1. Diffuse degenerative tearing of the posterior horn and body of
the medial meniscus with an associated small meniscal flap fragment.
2. The lateral meniscus, cruciate and collateral ligaments are
intact.
3. No acute osseous findings or significant arthropathic changes for
age.
4. Nonspecific moderate-sized knee joint effusion with mild synovial
enhancement.

## 2020-10-14 MED ORDER — POTASSIUM CHLORIDE 20 MEQ PO PACK
20.0000 meq | PACK | Freq: Two times a day (BID) | ORAL | Status: AC
  Administered 2020-10-14 (×2): 20 meq via ORAL
  Filled 2020-10-14 (×2): qty 1

## 2020-10-14 MED ORDER — NEPRO/CARBSTEADY PO LIQD
237.0000 mL | Freq: Two times a day (BID) | ORAL | Status: DC
  Administered 2020-10-16 – 2020-10-19 (×8): 237 mL via ORAL

## 2020-10-14 MED ORDER — GADOBUTROL 1 MMOL/ML IV SOLN
10.0000 mL | Freq: Once | INTRAVENOUS | Status: AC | PRN
  Administered 2020-10-14: 10 mL via INTRAVENOUS
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
## 2020-10-14 NOTE — Progress Notes (Signed)
Bedside shift report complete. Received patient awake,alert/orientedx4 and able to verbalize needs. NAD noted; respirations even on room air. Right sided weakness noted. PEG tube noted to RUQ; clamped at this time. Foley in place c/d/i. Dressing to heel c/d/i. Dressing to sacrum noted. Some movement to extremities noted. Whiteboard updated. All safety measures in place and personal belongings within reach.
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
## 2020-10-14 NOTE — Progress Notes (Signed)
Physical Therapy Wound Treatment Patient Details  Name: Daniel [name] MRN: [id number] Date of Birth: [birth year]  Today's Date: 10/14/2020 Time: 1010-1045 Time Calculation (min): 35 min  Subjective  Subjective Assessment Subjective: Pt pleasant and agreeable to hydrotherapy Patient and Family Stated Goals: None stated Date of Onset:  (Unknown) Prior Treatments:  (Dressing changes)  Pain Score:  No complaints of pain throughout.   Wound Assessment  Pressure Injury 10/12/20 Sacrum Unstageable - Full thickness tissue loss in which the base of the injury is covered by slough (yellow, tan, gray, green or brown) and/or eschar (tan, brown or black) in the wound bed. (Active)  Dressing Type ABD;Barrier Film (skin prep);Gauze (Comment);Moist to moist;Santyl 10/14/20 1234  Dressing Changed;Clean;Dry;Intact 10/14/20 1234  Dressing Change Frequency Daily 10/14/20 1234  State of Healing Eschar 10/14/20 1234  Site / Wound Assessment Pink;Yellow;Black 10/14/20 1234  % Wound base Red or Granulating 20% 10/14/20 1234  % Wound base Yellow/Fibrinous Exudate 70% 10/14/20 1234  % Wound base Black/Eschar 10% 10/14/20 1234  % Wound base Other/Granulation Tissue (Comment) 0% 10/14/20 1234  Peri-wound Assessment Intact;Maceration 10/14/20 1234  Wound Length (cm) 4 cm 10/13/20 1200  Wound Width (cm) 5 cm 10/13/20 1200  Wound Depth (cm) 0.1 cm 10/13/20 1200  Wound Surface Area (cm^2) 20 cm^2 10/13/20 1200  Wound Volume (cm^3) 2 cm^3 10/13/20 1200  Tunneling (cm) 0 10/13/20 1424  Undermining (cm) 0 10/13/20 1424  Margins Unattached edges (unapproximated) 10/14/20 1234  Drainage Amount Minimal 10/14/20 1234  Drainage Description Serosanguineous 10/14/20 1234  Treatment Debridement (Selective);Hydrotherapy (Pulse lavage);Packing (Saline gauze) 10/14/20 1234      Hydrotherapy Pulsed lavage therapy - wound location: Left buttock/sacrum Pulsed Lavage with Suction (psi): 12 psi Pulsed Lavage with Suction -  Normal Saline Used: 1000 mL Pulsed Lavage Tip: Tip with splash shield Selective Debridement Selective Debridement - Location: Left buttock/sacrum Selective Debridement - Tools Used: Forceps,Scalpel Selective Debridement - Tissue Removed: Eschar    Wound Assessment and Plan  Wound Therapy - Assess/Plan/Recommendations Wound Therapy - Clinical Statement: Wound appearance improved today and pt tolerated well without complaints of pain. This patient will benefit from continued hydrotherapy for selective removal of unviable tissue, to decrease bioburden, and promote wound bed healing. Wound Therapy - Functional Problem List: Decreased overall mobility and tolerance for sitting OOB Factors Delaying/Impairing Wound Healing: Infection - systemic/local,Immobility Hydrotherapy Plan: Debridement,Dressing change,Patient/family education,Pulsatile lavage with suction Wound Therapy - Frequency: 6X / week Wound Therapy - Follow Up Recommendations: dressing changes by family/patient  Wound Therapy Goals- Improve the function of patient's integumentary system by progressing the wound(s) through the phases of wound healing (inflammation - proliferation - remodeling) by: Wound Therapy Goals - Improve the function of patient's integumentary system by progressing the wound(s) through the phases of wound healing by: Decrease Necrotic Tissue to: 0 Decrease Necrotic Tissue - Progress: Progressing toward goal Increase Granulation Tissue to: 100 Increase Granulation Tissue - Progress: Progressing toward goal Goals/treatment plan/discharge plan were made with and agreed upon by patient/family: Yes Time For Goal Achievement: 7 days Wound Therapy - Potential for Goals: Good  Goals will be updated until maximal potential achieved or discharge criteria met.  Discharge criteria: when goals achieved, discharge from hospital, MD decision/surgical intervention, no progress towards goals, refusal/missing three consecutive  treatments without notification or medical reason.  GP     Charges PT Wound Care Charges $Wound Debridement up to 20 cm: < or equal to 20 cm $PT Hydrotherapy Dressing: 1 dressing $PT PLS Gun and Tip:  1 Supply $PT Hydrotherapy Visit: 1 Visit       [name] 10/14/2020, 12:43 PM  Rolinda Roan, PT, DPT Acute Rehabilitation Services Pager: [phone] Office: [phone]
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
## 2020-10-14 NOTE — Plan of Care (Signed)

## 2020-10-15 DIAGNOSIS — A419 Sepsis, unspecified organism: Secondary | ICD-10-CM | POA: Diagnosis not present

## 2020-10-15 DIAGNOSIS — R6520 Severe sepsis without septic shock: Secondary | ICD-10-CM | POA: Diagnosis not present

## 2020-10-15 NOTE — Progress Notes (Signed)
PT Cancellation Note  Patient Details Name: Daniel [name] MRN: [id number] DOB: 08/19/1940   Cancelled Treatment:    Reason Eval/Treat Not Completed: (P) Patient at procedure or test/unavailable (MD at bedside to remove G tibe per RN will continues efforts per POC.)   Cleva Camero Eli Hose 10/15/2020, 3:55 PM  Erasmo Leventhal , PTA Acute Rehabilitation Services Pager [phone] Office [phone]

## 2020-10-15 NOTE — Progress Notes (Signed)
Interventional Radiology Brief Note:  IR consulted for gastrostomy tube removal.  Tube originally placed in [place] in January, however most recently replaced by IR 08/02/20.  20 Fr balloon retention tube in place.   PA to bedside.  Patient alert and oriented. Agreeable to procedure.  Verbal consent obtained and witnessed by RN, [place] due to patient's endorsement of weakness in right hand.  States he has been eating and drinking well.  Per most recent SLP assessment, patient eating safely with tolerance.  Recommended tube removal.   Tube removed at bedside without complication.  Site intact, clean, and dry.  Dressing placed.  Case instructions given to RN and patient.  May advance diet slowly this evening into tomorrow.   Brynda Greathouse, MS RD PA-C

## 2020-10-15 NOTE — Progress Notes (Signed)
   Trauma/Critical Care Follow Up Note  Subjective:    Overnight Issues:   Objective:  Vital signs for last 24 hours: Temp:  [97.5 F (36.4 C)-98.6 F (37 C)] 97.5 F (36.4 C) (06/03 0900) Pulse Rate:  [72-91] 72 (06/03 0900) Resp:  [17-18] 18 (06/03 0900) BP: (122-142)/(82-92) 142/92 (06/03 0900) SpO2:  [96 %-99 %] 99 % (06/03 0900)  Hemodynamic parameters for last 24 hours:    Intake/Output from previous day: 06/02 0701 - 06/03 0700 In: 120 [P.O.:120] Out: 2125 [Urine:2125]  Intake/Output this shift: Total I/O In: -  Out: 1100 [Urine:1100]  Vent settings for last 24 hours:    Physical Exam:  Gen: comfortable, no distress Neuro: non-focal exam HEENT: PERRL Neck: supple CV: RRR Pulm: unlabored breathing Abd: soft, NT, PEG in good position  GU: clear yellow urine, sacral would with moderate fibrinous exudate present Extr: wwp, no edema   No results found for this or any previous visit (from the past 24 hour(s)).  Assessment & Plan:  Present on Admission:  Severe sepsis ([place])  Decubitus ulcer of sacral region, unstageable ([place])  Left knee pain  Atrial fibrillation (HCC)  Pressure injury of deep tissue of left heel  Pressure injury of deep tissue of right heel    LOS: 4 days   Additional comments:I reviewed the patient's new clinical lab test results.   and I reviewed the patients new imaging test results.    Possible UTI L knee pain and swelling Atrial fibrillation  HTN Normocytic Anemia  Hypothyroidism  BPH Left heel pressure injuries, unstageable Dysphagia - by report not using his g-tube.  This can be removed per primary service if he isn't going to be using it any further. Physical deconditioning  Chronic venous insufficiency  - Above per TRH -    Sacral decubitus pressure ulcer, unstageable  - MRI/CT with presacral edema, generalized inflammation  - there no evidence of infection of this wound. S/p hydrotherapy with improved appearance.  Recommend continuing hydro, santyl, along with BID dressing changes for debridement. No surgical needs present.    FEN: DYS 1 diet VTE: SCDs, therapeutic lovenox (115 mg BID) ID: Zosyn 5/30; Vanc 5/30>>, cefepime/flagyl 5/31>>   Jesusita Oka, MD Trauma & General Surgery Please use [email or web address] to contact on call provider  10/15/2020  *Care during the described time interval was provided by me. I have reviewed this patient's available data, including medical history, events of note, physical examination and test results as part of my evaluation.

## 2020-10-15 NOTE — Progress Notes (Signed)
Bedside shift report complete. Received patient awake,alert/orientedx4 and able to verbalize needs. NAD noted; respirations even on room air. Right sided weakness noted. Foley in place c/d/i. Dressing to heel c/d/i. Dressing to sacrum noted. Some movement to extremities noted. Whiteboard updated. All safety measures in place and personal belongings within reach.

## 2020-10-15 NOTE — Plan of Care (Signed)

## 2020-10-15 NOTE — Discharge Instructions (Signed)
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
## 2020-10-15 NOTE — Progress Notes (Signed)
Physical Therapy Wound Treatment Patient Details  Name: Daniel [name] MRN: [id number] Date of Birth: [birth year]  Today's Date: 10/15/2020 Time: 1018-1100 Time Calculation (min): 42 min  Subjective  Subjective Assessment Subjective: Pt pleasant and agreeable to hydrotherapy Patient and Family Stated Goals: None stated Date of Onset:  (Unknown) Prior Treatments:  (Dressing changes)  Pain Score:  Pt denies pain throughout session.    Wound Assessment  Pressure Injury 10/12/20 Sacrum Unstageable - Full thickness tissue loss in which the base of the injury is covered by slough (yellow, tan, gray, green or brown) and/or eschar (tan, brown or black) in the wound bed. (Active)  Dressing Type ABD;Barrier Film (skin prep);Gauze (Comment);Moist to moist;Santyl 10/15/20 1200  Dressing Changed;Clean;Dry;Intact 10/15/20 1200  Dressing Change Frequency Daily 10/15/20 1200  State of Healing Early/partial granulation 10/15/20 1200  Site / Wound Assessment Pink;Yellow 10/15/20 1200  % Wound base Red or Granulating 20% 10/15/20 1200  % Wound base Yellow/Fibrinous Exudate 80% 10/15/20 1200  % Wound base Black/Eschar 0% 10/15/20 1200  % Wound base Other/Granulation Tissue (Comment) 0% 10/15/20 1200  Peri-wound Assessment Intact;Pink 10/15/20 1200  Wound Length (cm) 4 cm 10/13/20 1200  Wound Width (cm) 5 cm 10/13/20 1200  Wound Depth (cm) 0.1 cm 10/13/20 1200  Wound Surface Area (cm^2) 20 cm^2 10/13/20 1200  Wound Volume (cm^3) 2 cm^3 10/13/20 1200  Tunneling (cm) 0 10/13/20 1424  Undermining (cm) 0 10/13/20 1424  Margins Unattached edges (unapproximated) 10/15/20 1200  Drainage Amount Minimal 10/15/20 1200  Drainage Description Serosanguineous 10/15/20 1200  Treatment Debridement (Selective);Hydrotherapy (Pulse lavage);Packing (Saline gauze) 10/15/20 1200      Hydrotherapy Pulsed lavage therapy - wound location: Left buttock/sacrum Pulsed Lavage with Suction (psi): 12 psi Pulsed Lavage with  Suction - Normal Saline Used: 1000 mL Pulsed Lavage Tip: Tip with splash shield Selective Debridement Selective Debridement - Location: Left buttock/sacrum Selective Debridement - Tools Used: Forceps,Scalpel Selective Debridement - Tissue Removed: Eschar    Wound Assessment and Plan  Wound Therapy - Assess/Plan/Recommendations Wound Therapy - Clinical Statement: Necrotic tissue continues to soften and wound appearance overall improving. This patient will benefit from continued hydrotherapy for selective removal of unviable tissue, to decrease bioburden, and promote wound bed healing. Wound Therapy - Functional Problem List: Decreased overall mobility and tolerance for sitting OOB Factors Delaying/Impairing Wound Healing: Infection - systemic/local,Immobility Hydrotherapy Plan: Debridement,Dressing change,Patient/family education,Pulsatile lavage with suction Wound Therapy - Frequency: 6X / week Wound Therapy - Follow Up Recommendations: dressing changes by family/patient  Wound Therapy Goals- Improve the function of patient's integumentary system by progressing the wound(s) through the phases of wound healing (inflammation - proliferation - remodeling) by: Wound Therapy Goals - Improve the function of patient's integumentary system by progressing the wound(s) through the phases of wound healing by: Decrease Necrotic Tissue to: 0 Decrease Necrotic Tissue - Progress: Progressing toward goal Increase Granulation Tissue to: 100 Increase Granulation Tissue - Progress: Progressing toward goal Goals/treatment plan/discharge plan were made with and agreed upon by patient/family: Yes Time For Goal Achievement: 7 days Wound Therapy - Potential for Goals: Good  Goals will be updated until maximal potential achieved or discharge criteria met.  Discharge criteria: when goals achieved, discharge from hospital, MD decision/surgical intervention, no progress towards goals, refusal/missing three  consecutive treatments without notification or medical reason.  GP     Charges PT Wound Care Charges $Wound Debridement up to 20 cm: < or equal to 20 cm $PT Hydrotherapy Dressing: 1 dressing $PT PLS Gun and Tip:  1 Supply $PT Hydrotherapy Visit: 1 Visit       [name] 10/15/2020, 1:06 PM  Rolinda Roan, PT, DPT Acute Rehabilitation Services Pager: [phone] Office: [phone]

## 2020-10-15 NOTE — Progress Notes (Signed)
After informed written consent patient was lying supine on exam table.  Left knee was prepped with iodine and alcohol swab.  Utilizing superolateral approach, 39mL of lidocaine was used for local anesthesia. The 1.5 inch, 18g needle on 60cc syringe was not long enough to reach the effusion. The Left knee was prepped with iodine again and additional 36mL of lidocaine was administered for local anesthesia. Then re-attempted using a 2.5 inch, 25g on 60cc syringe which unfortunately did not aspirate any fluid. The procedure was terminated for patient comfort. Patient tolerated procedure well without immediate complications. Left knee effusion remains, may consider repeating attempt tomorrow with 18g needle and more superior approach.

## 2020-10-16 LAB — CBC
HCT: 37.7 % — ABNORMAL LOW (ref 39.0–52.0)
Hemoglobin: 11.2 g/dL — ABNORMAL LOW (ref 13.0–17.0)
MCH: 25.3 pg — ABNORMAL LOW (ref 26.0–34.0)
MCHC: 29.7 g/dL — ABNORMAL LOW (ref 30.0–36.0)
MCV: 85.1 fL (ref 80.0–100.0)
Platelets: 272 10*3/uL (ref 150–400)
RBC: 4.43 MIL/uL (ref 4.22–5.81)
RDW: 15.6 % — ABNORMAL HIGH (ref 11.5–15.5)
WBC: 9.6 10*3/uL (ref 4.0–10.5)
nRBC: 0 % (ref 0.0–0.2)

## 2020-10-16 LAB — BASIC METABOLIC PANEL
Anion gap: 10 (ref 5–15)
BUN: 6 mg/dL — ABNORMAL LOW (ref 8–23)
CO2: 26 mmol/L (ref 22–32)
Calcium: 8.7 mg/dL — ABNORMAL LOW (ref 8.9–10.3)
Chloride: 105 mmol/L (ref 98–111)
Creatinine, Ser: 0.73 mg/dL (ref 0.61–1.24)
GFR, Estimated: >60 mL/min (ref >60)
Glucose, Bld: 85 mg/dL (ref 70–99)
Potassium: 3.2 mmol/L — ABNORMAL LOW (ref 3.5–5.1)
Sodium: 141 mmol/L (ref 135–145)

## 2020-10-16 LAB — CULTURE, BODY FLUID W GRAM STAIN -BOTTLE
Culture: NO GROWTH
Special Requests: ADEQUATE

## 2020-10-16 LAB — MAGNESIUM: Magnesium: 1.8 mg/dL (ref 1.7–2.4)

## 2020-10-16 MED ORDER — CHLORTHALIDONE 25 MG PO TABS
25.0000 mg | ORAL_TABLET | Freq: Every day | ORAL | Status: DC
  Administered 2020-10-16 – 2020-10-25 (×10): 25 mg via ORAL
  Filled 2020-10-16 (×11): qty 1

## 2020-10-16 MED ORDER — LEVOFLOXACIN 500 MG PO TABS
750.0000 mg | ORAL_TABLET | Freq: Every day | ORAL | Status: AC
  Administered 2020-10-16 – 2020-10-26 (×11): 750 mg via ORAL
  Filled 2020-10-16 (×11): qty 2

## 2020-10-16 MED ORDER — POTASSIUM CHLORIDE CRYS ER 20 MEQ PO TBCR
40.0000 meq | EXTENDED_RELEASE_TABLET | Freq: Once | ORAL | Status: AC
  Administered 2020-10-16: 40 meq via ORAL
  Filled 2020-10-16: qty 2

## 2020-10-16 MED ORDER — METRONIDAZOLE 500 MG PO TABS
500.0000 mg | ORAL_TABLET | Freq: Three times a day (TID) | ORAL | Status: AC
  Administered 2020-10-16 – 2020-10-26 (×32): 500 mg via ORAL
  Filled 2020-10-16 (×32): qty 1

## 2020-10-16 MED ORDER — LINEZOLID 600 MG PO TABS
600.0000 mg | ORAL_TABLET | Freq: Two times a day (BID) | ORAL | Status: AC
  Administered 2020-10-16 – 2020-10-26 (×22): 600 mg via ORAL
  Filled 2020-10-16 (×22): qty 1
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
## 2020-10-16 NOTE — Progress Notes (Signed)
Bladder scanned pt at 0635. Yielded 428 . Will insert foley per MD's order.

## 2020-10-16 NOTE — Progress Notes (Signed)
foley cath inserted. Pt tolerated with no problems. VWilliams,RN.

## 2020-10-16 NOTE — Progress Notes (Signed)
Physical Therapy Wound Treatment Patient Details  Name: Daniel [name] MRN: [id number] Date of Birth: [birth year]  Today's Date: 10/16/2020 Time: 3419-3790 Time Calculation (min): 22 min  Subjective  Subjective Assessment Subjective: Pt pleasant and agreeable to hydrotherapy Patient and Family Stated Goals: None stated Date of Onset:  (Unknown) Prior Treatments:  (Dressing changes)  Pain Score:  6/10 with sharp debridement  Wound Assessment  Pressure Injury 10/12/20 Sacrum Unstageable - Full thickness tissue loss in which the base of the injury is covered by slough (yellow, tan, gray, green or brown) and/or eschar (tan, brown or black) in the wound bed. (Active)  Dressing Type ABD;Barrier Film (skin prep);Gauze (Comment);Moist to dry;Santyl 10/16/20 1210  Dressing Changed;Clean;Dry;Intact 10/16/20 1210  Dressing Change Frequency Daily 10/16/20 1210  State of Healing Early/partial granulation 10/16/20 1210  Site / Wound Assessment Yellow;Pink 10/16/20 1210  % Wound base Red or Granulating 40% 10/16/20 1210  % Wound base Yellow/Fibrinous Exudate 60% 10/16/20 1210  % Wound base Black/Eschar 0% 10/16/20 1210  % Wound base Other/Granulation Tissue (Comment) 0% 10/16/20 1210  Peri-wound Assessment Intact;Pink 10/16/20 1210  Wound Length (cm) 4 cm 10/13/20 1200  Wound Width (cm) 5 cm 10/13/20 1200  Wound Depth (cm) 0.1 cm 10/13/20 1200  Wound Surface Area (cm^2) 20 cm^2 10/13/20 1200  Wound Volume (cm^3) 2 cm^3 10/13/20 1200  Tunneling (cm) 0 10/13/20 1424  Undermining (cm) 0 10/13/20 1424  Margins Unattached edges (unapproximated) 10/16/20 1210  Drainage Amount Minimal 10/16/20 1210  Drainage Description Serosanguineous 10/16/20 1210  Treatment Debridement (Selective);Hydrotherapy (Pulse lavage);Packing (Saline gauze) 10/16/20 1210     Hydrotherapy Pulsed lavage therapy - wound location: Left buttock/sacrum Pulsed Lavage with Suction (psi): 12 psi Pulsed Lavage with Suction -  Normal Saline Used: 1000 mL Pulsed Lavage Tip: Tip with splash shield Selective Debridement Selective Debridement - Location: Left buttock/sacrum Selective Debridement - Tools Used: Forceps,Scalpel Selective Debridement - Tissue Removed: Eschar    Wound Assessment and Plan  Wound Therapy - Assess/Plan/Recommendations Wound Therapy - Clinical Statement: Wound bed progressig with increased granulation tissue. Pt reports increased "burning," pain this session with sharp debridement which he notes he has not had before.This patient will benefit from continued hydrotherapy for selective removal of unviable tissue, to decrease bioburden, and promote wound bed healing. Wound Therapy - Functional Problem List: Decreased overall mobility and tolerance for sitting OOB Factors Delaying/Impairing Wound Healing: Infection - systemic/local,Immobility Hydrotherapy Plan: Debridement,Dressing change,Patient/family education,Pulsatile lavage with suction Wound Therapy - Frequency: 6X / week Wound Therapy - Follow Up Recommendations: dressing changes by family/patient  Wound Therapy Goals- Improve the function of patient's integumentary system by progressing the wound(s) through the phases of wound healing (inflammation - proliferation - remodeling) by: Wound Therapy Goals - Improve the function of patient's integumentary system by progressing the wound(s) through the phases of wound healing by: Decrease Necrotic Tissue to: 0 Decrease Necrotic Tissue - Progress: Progressing toward goal Increase Granulation Tissue to: 100 Increase Granulation Tissue - Progress: Progressing toward goal Goals/treatment plan/discharge plan were made with and agreed upon by patient/family: Yes Time For Goal Achievement: 7 days Wound Therapy - Potential for Goals: Good  Goals will be updated until maximal potential achieved or discharge criteria met.  Discharge criteria: when goals achieved, discharge from hospital, MD  decision/surgical intervention, no progress towards goals, refusal/missing three consecutive treatments without notification or medical reason.  GP     Charges PT Wound Care Charges $Wound Debridement up to 20 cm: < or equal to 20 cm $  PT PLS Gun and Tip: 1 Supply $PT Hydrotherapy Visit: 1 Visit  Wyona Almas, PT, DPT Acute Rehabilitation Services Pager [phone] Office [place] 10/16/2020, 12:12 PM

## 2020-10-16 NOTE — Progress Notes (Signed)
Bladder scan done. Yielded 578 ml. Dr. Charleen Kirks made aware. Order given to perform in and out cath. 600 ml of pale yellow urine obtained. Pt tolerated in and out cath with no problems.  VWilliams,RN.

## 2020-10-17 LAB — BASIC METABOLIC PANEL
Anion gap: 7 (ref 5–15)
BUN: 7 mg/dL — ABNORMAL LOW (ref 8–23)
CO2: 26 mmol/L (ref 22–32)
Calcium: 8.8 mg/dL — ABNORMAL LOW (ref 8.9–10.3)
Chloride: 106 mmol/L (ref 98–111)
Creatinine, Ser: 0.77 mg/dL (ref 0.61–1.24)
GFR, Estimated: >60 mL/min (ref >60)
Glucose, Bld: 96 mg/dL (ref 70–99)
Potassium: 3.3 mmol/L — ABNORMAL LOW (ref 3.5–5.1)
Sodium: 139 mmol/L (ref 135–145)

## 2020-10-17 MED ORDER — POTASSIUM CHLORIDE 20 MEQ PO PACK
20.0000 meq | PACK | Freq: Once | ORAL | Status: AC
  Administered 2020-10-17: 20 meq via ORAL
  Filled 2020-10-17: qty 1

## 2020-10-17 MED ORDER — MAGNESIUM SULFATE 2 GM/50ML IV SOLN
2.0000 g | Freq: Once | INTRAVENOUS | Status: AC
  Administered 2020-10-17: 2 g via INTRAVENOUS
  Filled 2020-10-17: qty 50

## 2020-10-17 MED ORDER — POTASSIUM CHLORIDE CRYS ER 20 MEQ PO TBCR
40.0000 meq | EXTENDED_RELEASE_TABLET | Freq: Once | ORAL | Status: AC
  Administered 2020-10-17: 40 meq via ORAL
  Filled 2020-10-17: qty 2

## 2020-10-17 NOTE — Plan of Care (Signed)
  Problem: Education: Goal: Knowledge of General Education information will improve Description: Including pain rating scale, medication(s)/side effects and non-pharmacologic comfort measures Outcome: Progressing   Problem: Health Behavior/Discharge Planning: Goal: Ability to manage health-related needs will improve Outcome: Progressing   Problem: Clinical Measurements: Goal: Ability to maintain clinical measurements within normal limits will improve Outcome: Progressing   Problem: Nutrition: Goal: Adequate nutrition will be maintained Outcome: Progressing   Problem: Pain Managment: Goal: General experience of comfort will improve Outcome: Progressing   Problem: Activity: Goal: Risk for activity intolerance will decrease Outcome: Not Progressing   

## 2020-10-17 NOTE — Plan of Care (Signed)
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
## 2020-10-18 ENCOUNTER — Inpatient Hospital Stay (HOSPITAL_COMMUNITY): Payer: Medicare Other

## 2020-10-18 DIAGNOSIS — R6520 Severe sepsis without septic shock: Secondary | ICD-10-CM | POA: Diagnosis not present

## 2020-10-18 DIAGNOSIS — A419 Sepsis, unspecified organism: Secondary | ICD-10-CM | POA: Diagnosis not present

## 2020-10-18 LAB — CULTURE, BLOOD (ROUTINE X 2)
Culture: NO GROWTH
Culture: NO GROWTH
Special Requests: ADEQUATE
Special Requests: ADEQUATE

## 2020-10-18 LAB — BASIC METABOLIC PANEL
Anion gap: 7 (ref 5–15)
BUN: 8 mg/dL (ref 8–23)
CO2: 28 mmol/L (ref 22–32)
Calcium: 9 mg/dL (ref 8.9–10.3)
Chloride: 103 mmol/L (ref 98–111)
Creatinine, Ser: 0.92 mg/dL (ref 0.61–1.24)
GFR, Estimated: >60 mL/min (ref >60)
Glucose, Bld: 87 mg/dL (ref 70–99)
Potassium: 3.5 mmol/L (ref 3.5–5.1)
Sodium: 138 mmol/L (ref 135–145)

## 2020-10-18 LAB — MAGNESIUM: Magnesium: 1.9 mg/dL (ref 1.7–2.4)

## 2020-10-18 MED ORDER — FENTANYL CITRATE (PF) 100 MCG/2ML IJ SOLN
12.5000 ug | Freq: Every day | INTRAMUSCULAR | Status: DC | PRN
  Administered 2020-10-19 – 2020-10-25 (×4): 12.5 ug via INTRAVENOUS
  Filled 2020-10-18 (×4): qty 2

## 2020-10-18 MED ORDER — POTASSIUM CHLORIDE CRYS ER 20 MEQ PO TBCR
20.0000 meq | EXTENDED_RELEASE_TABLET | Freq: Every day | ORAL | Status: DC
  Administered 2020-10-18 – 2020-10-21 (×4): 20 meq via ORAL
  Filled 2020-10-18 (×4): qty 1

## 2020-10-18 NOTE — Plan of Care (Signed)
  Problem: Education: Goal: Knowledge of General Education information will improve Description: Including pain rating scale, medication(s)/side effects and non-pharmacologic comfort measures Outcome: Progressing   Problem: Health Behavior/Discharge Planning: Goal: Ability to manage health-related needs will improve Outcome: Progressing   Problem: Nutrition: Goal: Adequate nutrition will be maintained Outcome: Progressing   Problem: Pain Managment: Goal: General experience of comfort will improve Outcome: Progressing   Problem: Activity: Goal: Risk for activity intolerance will decrease Outcome: Not Progressing

## 2020-10-18 NOTE — Progress Notes (Signed)
Pt arrived back to unit from swallow study. Alert and oriented and not in distress. Pt has no complaints.

## 2020-10-18 NOTE — Plan of Care (Signed)
  Problem: Clinical Measurements: Goal: Respiratory complications will improve Outcome: Progressing   Problem: Clinical Measurements: Goal: Cardiovascular complication will be avoided Outcome: Progressing   Problem: Activity: Goal: Risk for activity intolerance will decrease Outcome: Progressing   Problem: Nutrition: Goal: Adequate nutrition will be maintained Outcome: Progressing   Problem: Pain Managment: Goal: General experience of comfort will improve Outcome: Progressing   Problem: Safety: Goal: Ability to remain free from injury will improve Outcome: Progressing   Problem: Skin Integrity: Goal: Risk for impaired skin integrity will decrease Outcome: Progressing

## 2020-10-18 NOTE — Progress Notes (Signed)
Bedside shift report complete. Received patient awake,alert/orientedx4 and able to verbalize needs. NAD noted; respirations even on room air. Right sided weakness noted. Foley in place c/d/i.Dressing to heel c/d/i. Dressing to sacrum noted.Some movement to extremities noted. Prevalon booties to b/l feet noted. Whiteboard updated. All safety measures in place and personal belongings within reach.

## 2020-10-18 NOTE — Progress Notes (Signed)
Physical Therapy Wound Treatment Patient Details  Name: Daniel [name] MRN: [id number] Date of Birth: Aug 25, 1940  Today's Date: 10/18/2020 Time: 1505-6979 Time Calculation (min): 30 min  Subjective  Subjective Assessment Subjective: Pt pleasant and agreeable to hydrotherapy Patient and Family Stated Goals: None stated Date of Onset:  (Unknown) Prior Treatments:  (Dressing changes)  Pain Score:  Pt reports increased pain ("burning") with debridement. Will discuss with MD.   Wound Assessment  Pressure Injury 10/12/20 Sacrum Unstageable - Full thickness tissue loss in which the base of the injury is covered by slough (yellow, tan, gray, green or brown) and/or eschar (tan, brown or black) in the wound bed. (Active)  Dressing Type ABD;Barrier Film (skin prep);Gauze (Comment);Moist to moist;Santyl 10/18/20 1033  Dressing Changed;Clean;Dry;Intact 10/18/20 1033  Dressing Change Frequency Daily 10/18/20 1033  State of Healing Early/partial granulation 10/18/20 1033  Site / Wound Assessment Yellow;Pink 10/18/20 1033  % Wound base Red or Granulating 40% 10/18/20 1033  % Wound base Yellow/Fibrinous Exudate 60% 10/18/20 1033  % Wound base Black/Eschar 0% 10/18/20 1033  % Wound base Other/Granulation Tissue (Comment) 0% 10/18/20 1033  Peri-wound Assessment Intact 10/18/20 1033  Wound Length (cm) 4 cm 10/13/20 1200  Wound Width (cm) 5 cm 10/13/20 1200  Wound Depth (cm) 0.1 cm 10/13/20 1200  Wound Surface Area (cm^2) 20 cm^2 10/13/20 1200  Wound Volume (cm^3) 2 cm^3 10/13/20 1200  Tunneling (cm) 0 10/13/20 1424  Undermining (cm) 0 10/13/20 1424  Margins Unattached edges (unapproximated) 10/18/20 1033  Drainage Amount Minimal 10/18/20 1033  Drainage Description Serosanguineous 10/18/20 1033  Treatment Debridement (Selective);Hydrotherapy (Pulse lavage);Packing (Saline gauze) 10/18/20 1033      Hydrotherapy Pulsed lavage therapy - wound location: Left buttock/sacrum Pulsed Lavage with Suction  (psi): 12 psi Pulsed Lavage with Suction - Normal Saline Used: 1000 mL Pulsed Lavage Tip: Tip with splash shield Selective Debridement Selective Debridement - Location: Left buttock/sacrum Selective Debridement - Tools Used: Forceps,Scalpel Selective Debridement - Tissue Removed: Unviable tissue    Wound Assessment and Plan  Wound Therapy - Assess/Plan/Recommendations Wound Therapy - Clinical Statement: As wound bed improves, pt is reporting increased pain with treatment - especially debridement. Pt has been getting PRN Tylenol prior to session, but may need to discuss an alternative with MD if pain continues to increase. This patient will benefit from continued hydrotherapy for selective removal of unviable tissue, to decrease bioburden, and promote wound bed healing. Wound Therapy - Functional Problem List: Decreased overall mobility and tolerance for sitting OOB Factors Delaying/Impairing Wound Healing: Infection - systemic/local,Immobility Hydrotherapy Plan: Debridement,Dressing change,Patient/family education,Pulsatile lavage with suction Wound Therapy - Frequency: 6X / week Wound Therapy - Follow Up Recommendations: dressing changes by family/patient  Wound Therapy Goals- Improve the function of patient's integumentary system by progressing the wound(s) through the phases of wound healing (inflammation - proliferation - remodeling) by: Wound Therapy Goals - Improve the function of patient's integumentary system by progressing the wound(s) through the phases of wound healing by: Decrease Necrotic Tissue to: 0 Decrease Necrotic Tissue - Progress: Progressing toward goal Increase Granulation Tissue to: 100 Increase Granulation Tissue - Progress: Progressing toward goal Goals/treatment plan/discharge plan were made with and agreed upon by patient/family: Yes Time For Goal Achievement: 7 days Wound Therapy - Potential for Goals: Good  Goals will be updated until maximal potential  achieved or discharge criteria met.  Discharge criteria: when goals achieved, discharge from hospital, MD decision/surgical intervention, no progress towards goals, refusal/missing three consecutive treatments without notification or medical reason.  GP     Charges PT Wound Care Charges $Wound Debridement up to 20 cm: < or equal to 20 cm $PT Hydrotherapy Dressing: 1 dressing $PT PLS Gun and Tip: 1 Supply $PT Hydrotherapy Visit: 1 Visit       [name] 10/18/2020, 10:38 AM   Rolinda Roan, PT, DPT Acute Rehabilitation Services Pager: [phone] Office: [phone]
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
## 2020-10-18 NOTE — Progress Notes (Signed)
Modified Barium Swallow Progress Note  Patient Details  Name: Daniel [name] MRN: [id number] Date of Birth: April 11, 1941  Today's Date: 10/18/2020  Modified Barium Swallow completed.  Full report located under Chart Review in the Imaging Section.  Brief recommendations include the following:  Clinical Impression  Pt presents with mild pharyngeal dysphagia characterized by inconsistently impaired timing of the swallow which resulted in penetration (PAS 3) of thin liquids (via cup but not via straw) when consecutive swallows were used. Pt's swallow mechanism was otherwise within functional limits. Throat clearing and slight coughing were intermittently noted following penetration. However, this was also noted in the absence of laryngeal invasion so these behaviors should be cautiously used as indicators of laryngeal invasion at bedside. A regular texture diet with thin liquids is recommended at this time with avoidance of consecutive swallows. SLP will follow briefly for further education and reinforcement of swallowing precautions.   Swallow Evaluation Recommendations       SLP Diet Recommendations: Regular solids;Thin liquid   Liquid Administration via: Cup;Straw   Medication Administration: Whole meds with liquid       Compensations: Slow rate;Small sips/bites (avoid consecutive swallows)   Postural Changes: Seated upright at 90 degrees           Justyne Roell I. Hardin Negus, [place], [place] Office number [phone] Pager [place] 10/18/2020,2:58 PM
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
## 2020-10-18 NOTE — Plan of Care (Signed)
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
## 2020-10-19 DIAGNOSIS — R6520 Severe sepsis without septic shock: Secondary | ICD-10-CM | POA: Diagnosis not present

## 2020-10-19 DIAGNOSIS — A419 Sepsis, unspecified organism: Secondary | ICD-10-CM | POA: Diagnosis not present

## 2020-10-19 LAB — BASIC METABOLIC PANEL
Anion gap: 7 (ref 5–15)
BUN: 8 mg/dL (ref 8–23)
CO2: 31 mmol/L (ref 22–32)
Calcium: 9.2 mg/dL (ref 8.9–10.3)
Chloride: 101 mmol/L (ref 98–111)
Creatinine, Ser: 0.92 mg/dL (ref 0.61–1.24)
GFR, Estimated: >60 mL/min (ref >60)
Glucose, Bld: 83 mg/dL (ref 70–99)
Potassium: 3.5 mmol/L (ref 3.5–5.1)
Sodium: 139 mmol/L (ref 135–145)

## 2020-10-19 MED ORDER — PENTAFLUOROPROP-TETRAFLUOROETH EX AERO: INHALATION_SPRAY | CUTANEOUS | Status: DC | PRN

## 2020-10-19 MED ORDER — ENSURE ENLIVE PO LIQD
237.0000 mL | Freq: Three times a day (TID) | ORAL | Status: DC
  Administered 2020-10-20 – 2020-11-01 (×26): 237 mL via ORAL

## 2020-10-19 NOTE — Progress Notes (Signed)
Orthopedic Tech Progress Note Patient Details:  Daniel [name] 1941/01/22 [id number]  Ortho Devices Type of Ortho Device: Knee Sleeve Ortho Device/Splint Location: BLE Ortho Device/Splint Interventions: Ordered,Application,Adjustment   Post Interventions Patient Tolerated: Well Instructions Provided: [facility] of [place] 10/19/2020, 4:24 PM

## 2020-10-19 NOTE — Progress Notes (Signed)
Bedside shift report complete. Received patient awake,alert/orientedx4 and able to verbalize needs. NAD noted; respirations even on room air. Right sided weakness noted. Foley in place c/d/i.Dressing to heel c/d/i. Dressing to sacrum noted.Some movement to extremities noted. Bilateral knee sleeves noted. Patient refusing Prevalon booties at this time. Whiteboard updated. All safety measures in place and personal belongings within reach.
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
## 2020-10-19 NOTE — Progress Notes (Signed)
Physical Therapy Wound Treatment Patient Details  Name: Daniel [name] MRN: [id number] Date of Birth: 1940/06/19  Today's Date: 10/19/2020 Time: 6578-4696 Time Calculation (min): 41 min  Subjective  Subjective Assessment Subjective: Pt pleasant and agreeable to hydrotherapy Patient and Family Stated Goals: None stated Date of Onset:  (Unknown) Prior Treatments:  (Dressing changes)  Pain Score:  Pt tolerated treatment well with premedication of new IV pain meds.   Wound Assessment  Pressure Injury 10/12/20 Sacrum Unstageable - Full thickness tissue loss in which the base of the injury is covered by slough (yellow, tan, gray, green or brown) and/or eschar (tan, brown or black) in the wound bed. (Active)  Dressing Type ABD;Barrier Film (skin prep);Gauze (Comment);Moist to moist;Santyl 10/19/20 1300  Dressing Changed;Clean;Dry;Intact 10/19/20 1300  Dressing Change Frequency Daily 10/19/20 1300  State of Healing Early/partial granulation 10/19/20 1300  Site / Wound Assessment Yellow;Pink 10/19/20 1300  % Wound base Red or Granulating 40% 10/19/20 1300  % Wound base Yellow/Fibrinous Exudate 60% 10/19/20 1300  % Wound base Black/Eschar 0% 10/19/20 1300  % Wound base Other/Granulation Tissue (Comment) 0% 10/19/20 1300  Peri-wound Assessment Intact 10/19/20 1300  Wound Length (cm) 4 cm 10/13/20 1200  Wound Width (cm) 5 cm 10/13/20 1200  Wound Depth (cm) 0.1 cm 10/13/20 1200  Wound Surface Area (cm^2) 20 cm^2 10/13/20 1200  Wound Volume (cm^3) 2 cm^3 10/13/20 1200  Tunneling (cm) 0 10/13/20 1424  Undermining (cm) 0 10/13/20 1424  Margins Unattached edges (unapproximated) 10/19/20 1300  Drainage Amount Minimal 10/19/20 1300  Drainage Description Serosanguineous 10/19/20 1300  Treatment Debridement (Selective);Hydrotherapy (Pulse lavage);Packing (Saline gauze) 10/19/20 1300      Hydrotherapy Pulsed lavage therapy - wound location: Left buttock/sacrum Pulsed Lavage with Suction (psi): 12  psi Pulsed Lavage with Suction - Normal Saline Used: 1000 mL Pulsed Lavage Tip: Tip with splash shield Selective Debridement Selective Debridement - Location: Left buttock/sacrum Selective Debridement - Tools Used: Forceps,Scalpel Selective Debridement - Tissue Removed: Unviable tissue    Wound Assessment and Plan  Wound Therapy - Assess/Plan/Recommendations Wound Therapy - Clinical Statement: Pt tolerated treatment with decreased reported pain with IV pain med premedication. Small tunnel beginning to develop in the center of the wound bed. This patient will benefit from continued hydrotherapy for selective removal of unviable tissue, to decrease bioburden, and promote wound bed healing. Wound Therapy - Functional Problem List: Decreased overall mobility and tolerance for sitting OOB Factors Delaying/Impairing Wound Healing: Infection - systemic/local,Immobility Hydrotherapy Plan: Debridement,Dressing change,Patient/family education,Pulsatile lavage with suction Wound Therapy - Frequency: 6X / week Wound Therapy - Follow Up Recommendations: dressing changes by family/patient  Wound Therapy Goals- Improve the function of patient's integumentary system by progressing the wound(s) through the phases of wound healing (inflammation - proliferation - remodeling) by: Wound Therapy Goals - Improve the function of patient's integumentary system by progressing the wound(s) through the phases of wound healing by: Decrease Necrotic Tissue to: 0 Decrease Necrotic Tissue - Progress: Progressing toward goal Increase Granulation Tissue to: 100 Increase Granulation Tissue - Progress: Progressing toward goal Goals/treatment plan/discharge plan were made with and agreed upon by patient/family: Yes Time For Goal Achievement: 7 days Wound Therapy - Potential for Goals: Good  Goals will be updated until maximal potential achieved or discharge criteria met.  Discharge criteria: when goals achieved, discharge  from hospital, MD decision/surgical intervention, no progress towards goals, refusal/missing three consecutive treatments without notification or medical reason.  GP     Charges PT Wound Care Charges $Wound Debridement  up to 20 cm: < or equal to 20 cm $PT Hydrotherapy Dressing: 2 dressings $PT PLS Gun and Tip: 1 Supply $PT Hydrotherapy Visit: 1 Visit       [name] 10/19/2020, 2:16 PM   Rolinda Roan, PT, DPT Acute Rehabilitation Services Pager: [phone] Office: [phone]

## 2020-10-20 DIAGNOSIS — A419 Sepsis, unspecified organism: Secondary | ICD-10-CM | POA: Diagnosis not present

## 2020-10-20 DIAGNOSIS — R6520 Severe sepsis without septic shock: Secondary | ICD-10-CM | POA: Diagnosis not present

## 2020-10-20 MED ORDER — NAPROXEN 250 MG PO TABS
500.0000 mg | ORAL_TABLET | Freq: Two times a day (BID) | ORAL | Status: DC
  Administered 2020-10-20 – 2020-10-22 (×6): 500 mg via ORAL
  Filled 2020-10-20 (×6): qty 2

## 2020-10-20 MED ORDER — DICLOFENAC SODIUM 1 % EX GEL
2.0000 g | Freq: Three times a day (TID) | CUTANEOUS | Status: DC
  Administered 2020-10-20 – 2020-11-01 (×50): 2 g via TOPICAL
  Filled 2020-10-20 (×2): qty 100

## 2020-10-20 NOTE — Care Management Important Message (Signed)
Important Message  Patient Details  Name: DOCTOR SHEAHAN MRN: [id number] Date of Birth: [birth year]   Medicare Important Message Given:  Yes - Important Message mailed due to current National Emergency   Verbal consent obtained due to current National Emergency  Relationship to patient: Self Contact Name: Townsend Call Date: 10/20/20  Time: 1242 Phone: [phone] Outcome: No Answer/Busy Important Message mailed to: Patient address on file    Delorse Lek 10/20/2020, 12:42 PM
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
## 2020-10-20 NOTE — Progress Notes (Signed)
Physical Therapy Wound Treatment Patient Details  Name: Daniel [name] MRN: [id number] Date of Birth: Jan 14, 1941  Today's Date: 10/20/2020 Time: 1315-1400 Time Calculation (min): 45 min  Subjective  Subjective Assessment Subjective: Pt pleasant and agreeable to hydrotherapy Patient and Family Stated Goals: None stated Date of Onset:  (Unknown) Prior Treatments:  (Dressing changes)  Pain Score:  Pt did not report pain throughout session.   Wound Assessment  Pressure Injury 10/12/20 Sacrum Unstageable - Full thickness tissue loss in which the base of the injury is covered by slough (yellow, tan, gray, green or brown) and/or eschar (tan, brown or black) in the wound bed. (Active)  Wound Image   10/20/20 1413  Dressing Type ABD;Barrier Film (skin prep);Gauze (Comment);Moist to moist;Santyl 10/20/20 1413  Dressing Changed;Clean;Dry;Intact 10/20/20 1413  Dressing Change Frequency Daily 10/20/20 1413  State of Healing Early/partial granulation 10/20/20 1413  Site / Wound Assessment Yellow;Pink 10/20/20 1413  % Wound base Red or Granulating 50% 10/20/20 1413  % Wound base Yellow/Fibrinous Exudate 50% 10/20/20 1413  % Wound base Black/Eschar 0% 10/20/20 1413  % Wound base Other/Granulation Tissue (Comment) 0% 10/20/20 1413  Peri-wound Assessment Intact 10/20/20 1413  Wound Length (cm) 4.5 cm 10/20/20 1300  Wound Width (cm) 5.5 cm 10/20/20 1300  Wound Depth (cm) 0.9 cm 10/20/20 1300  Wound Surface Area (cm^2) 24.75 cm^2 10/20/20 1300  Wound Volume (cm^3) 22.28 cm^3 10/20/20 1300  Tunneling (cm) 2 cm tunnel in center of wound. 10/20/20 1300  Undermining (cm) 0 10/20/20 1413  Margins Unattached edges (unapproximated) 10/20/20 1413  Drainage Amount Minimal 10/20/20 1413  Drainage Description Serosanguineous 10/20/20 1413  Treatment Debridement (Selective);Hydrotherapy (Pulse lavage);Packing (Saline gauze) 10/20/20 1413      Hydrotherapy Pulsed lavage therapy - wound location: Left  buttock/sacrum Pulsed Lavage with Suction (psi): 12 psi Pulsed Lavage with Suction - Normal Saline Used: 1000 mL Pulsed Lavage Tip: Tip with splash shield Selective Debridement Selective Debridement - Location: Left buttock/sacrum Selective Debridement - Tools Used: Forceps,Scalpel Selective Debridement - Tissue Removed: Unviable tissue    Wound Assessment and Plan  Wound Therapy - Assess/Plan/Recommendations Wound Therapy - Clinical Statement: Tunnel in center of wound bed increasing in size this session after debridement. Overall wound bed appearance continues to improve. This patient will benefit from continued hydrotherapy for selective removal of unviable tissue, to decrease bioburden, and promote wound bed healing. Wound Therapy - Functional Problem List: Decreased overall mobility and tolerance for sitting OOB Factors Delaying/Impairing Wound Healing: Infection - systemic/local,Immobility Hydrotherapy Plan: Debridement,Dressing change,Patient/family education,Pulsatile lavage with suction Wound Therapy - Frequency: 6X / week Wound Therapy - Follow Up Recommendations: dressing changes by family/patient  Wound Therapy Goals- Improve the function of patient's integumentary system by progressing the wound(s) through the phases of wound healing (inflammation - proliferation - remodeling) by: Wound Therapy Goals - Improve the function of patient's integumentary system by progressing the wound(s) through the phases of wound healing by: Decrease Necrotic Tissue to: 0 Decrease Necrotic Tissue - Progress: Progressing toward goal Increase Granulation Tissue to: 100 Increase Granulation Tissue - Progress: Progressing toward goal Goals/treatment plan/discharge plan were made with and agreed upon by patient/family: Yes Time For Goal Achievement: 7 days Wound Therapy - Potential for Goals: Good  Goals will be updated until maximal potential achieved or discharge criteria met.  Discharge  criteria: when goals achieved, discharge from hospital, MD decision/surgical intervention, no progress towards goals, refusal/missing three consecutive treatments without notification or medical reason.  GP     Charges PT  Wound Care Charges $Wound Debridement up to 20 cm: < or equal to 20 cm $PT Hydrotherapy Dressing: 2 dressings $PT PLS Gun and Tip: 1 Supply $PT Hydrotherapy Visit: 1 Visit       [name] 10/20/2020, 2:19 PM   Rolinda Roan, PT, DPT Acute Rehabilitation Services Pager: [phone] Office: [phone]

## 2020-10-20 NOTE — TOC Progression Note (Addendum)
Transition of Care [facility]) - Progression Note    Patient Details  Name: Daniel [name] MRN: [id number] Date of Birth: [birth year]  [facility] CM/SW Contact  Sharin Mons, RN Phone Number: 10/20/2020, 2:37 PM  Clinical Narrative:    Pt  Without SNF BED offers.  NCM requested [facility] to review per daughter's request. [facility] SNF/Rehab.  reviewing for acceptance.Marland KitchenMarland KitchenTOC team will continue to monitor and assist with TOC needs.  10/20/2020 @ 3:19 pm [facility] declined pt for SNF placement  10/21/2020 @ 8:20 am [facility] reviewing for acceptance  6/10 No response from [facility] , NCM f/u with a call.. unable to leave voice message. [facility] reviewed again, can't accept 2/2 no private room , pt with ESBL.  Expected Discharge Plan: Skilled Nursing Facility Barriers to Discharge: No SNF bed  Expected Discharge Plan and Services Expected Discharge Plan: [place] In-house Referral: Clinical Social Work     Living arrangements for the past 2 months: Single Family Home                                       Social Determinants of Health (SDOH) Interventions    Readmission Risk Interventions No flowsheet data found.
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
## 2020-10-21 DIAGNOSIS — A419 Sepsis, unspecified organism: Secondary | ICD-10-CM | POA: Diagnosis not present

## 2020-10-21 DIAGNOSIS — R6520 Severe sepsis without septic shock: Secondary | ICD-10-CM | POA: Diagnosis not present

## 2020-10-21 LAB — CBC WITH DIFFERENTIAL/PLATELET
Abs Immature Granulocytes: 0.04 10*3/uL (ref 0.00–0.07)
Basophils Absolute: 0 10*3/uL (ref 0.0–0.1)
Basophils Relative: 0 %
Eosinophils Absolute: 0.2 10*3/uL (ref 0.0–0.5)
Eosinophils Relative: 2 %
HCT: 38.8 % — ABNORMAL LOW (ref 39.0–52.0)
Hemoglobin: 11.7 g/dL — ABNORMAL LOW (ref 13.0–17.0)
Immature Granulocytes: 0 %
Lymphocytes Relative: 18 %
Lymphs Abs: 1.7 10*3/uL (ref 0.7–4.0)
MCH: 25.9 pg — ABNORMAL LOW (ref 26.0–34.0)
MCHC: 30.2 g/dL (ref 30.0–36.0)
MCV: 86 fL (ref 80.0–100.0)
Monocytes Absolute: 0.8 10*3/uL (ref 0.1–1.0)
Monocytes Relative: 8 %
Neutro Abs: 6.6 10*3/uL (ref 1.7–7.7)
Neutrophils Relative %: 72 %
Platelets: 296 10*3/uL (ref 150–400)
RBC: 4.51 MIL/uL (ref 4.22–5.81)
RDW: 17.1 % — ABNORMAL HIGH (ref 11.5–15.5)
WBC: 9.4 10*3/uL (ref 4.0–10.5)
nRBC: 0 % (ref 0.0–0.2)

## 2020-10-21 LAB — BASIC METABOLIC PANEL
Anion gap: 9 (ref 5–15)
BUN: 15 mg/dL (ref 8–23)
CO2: 32 mmol/L (ref 22–32)
Calcium: 8.9 mg/dL (ref 8.9–10.3)
Chloride: 100 mmol/L (ref 98–111)
Creatinine, Ser: 0.96 mg/dL (ref 0.61–1.24)
GFR, Estimated: >60 mL/min (ref >60)
Glucose, Bld: 109 mg/dL — ABNORMAL HIGH (ref 70–99)
Potassium: 3.4 mmol/L — ABNORMAL LOW (ref 3.5–5.1)
Sodium: 141 mmol/L (ref 135–145)

## 2020-10-21 LAB — URINALYSIS, MICROSCOPIC (REFLEX): RBC / HPF: >50 RBC/hpf (ref 0–5)

## 2020-10-21 LAB — URINALYSIS, ROUTINE W REFLEX MICROSCOPIC

## 2020-10-21 MED ORDER — POTASSIUM CHLORIDE CRYS ER 20 MEQ PO TBCR
40.0000 meq | EXTENDED_RELEASE_TABLET | Freq: Every day | ORAL | Status: DC
  Administered 2020-10-21 – 2020-10-25 (×5): 40 meq via ORAL
  Filled 2020-10-21 (×5): qty 2
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
## 2020-10-21 NOTE — Progress Notes (Signed)
Physical Therapy Wound Treatment Patient Details  Name: Daniel [name] MRN: [id number] Date of Birth: 1941-02-21  Today's Date: 10/21/2020 Time: 1310-1400 Time Calculation (min): 50 min  Subjective  Subjective Assessment Subjective: Pt pleasant and agreeable to hydrotherapy Patient and Family Stated Goals: None stated Date of Onset:  (Unknown) Prior Treatments:  (Dressing changes)  Pain Score:  No reports of pain throughout session. Pt was premedicated.   Wound Assessment  Pressure Injury 10/12/20 Sacrum Unstageable - Full thickness tissue loss in which the base of the injury is covered by slough (yellow, tan, gray, green or brown) and/or eschar (tan, brown or black) in the wound bed. (Active)  Dressing Type ABD;Barrier Film (skin prep);Gauze (Comment);Moist to moist;Santyl 10/21/20 1425  Dressing Changed;Clean;Dry;Intact 10/21/20 1425  Dressing Change Frequency Daily 10/21/20 1425  State of Healing Early/partial granulation 10/21/20 1425  Site / Wound Assessment Yellow;Pink 10/21/20 1425  % Wound base Red or Granulating 55% 10/21/20 1425  % Wound base Yellow/Fibrinous Exudate 45% 10/21/20 1425  % Wound base Black/Eschar 0% 10/21/20 1425  % Wound base Other/Granulation Tissue (Comment) 0% 10/21/20 1425  Peri-wound Assessment Intact 10/21/20 1425  Wound Length (cm) 4.5 cm 10/20/20 1300  Wound Width (cm) 5.5 cm 10/20/20 1300  Wound Depth (cm) 0.9 cm 10/20/20 1300  Wound Surface Area (cm^2) 24.75 cm^2 10/20/20 1300  Wound Volume (cm^3) 22.28 cm^3 10/20/20 1300  Tunneling (cm) 2 cm tunnel in center of wound. 10/20/20 1300  Undermining (cm) 0 10/20/20 1413  Margins Unattached edges (unapproximated) 10/21/20 1425  Drainage Amount Minimal 10/21/20 1425  Drainage Description Serosanguineous 10/21/20 1425  Treatment Debridement (Selective);Hydrotherapy (Pulse lavage);Packing (Saline gauze) 10/21/20 1425      Hydrotherapy Pulsed lavage therapy - wound location: Left  buttock/sacrum Pulsed Lavage with Suction (psi): 12 psi Pulsed Lavage with Suction - Normal Saline Used: 1000 mL Pulsed Lavage Tip: Tip with splash shield Selective Debridement Selective Debridement - Location: Left buttock/sacrum Selective Debridement - Tools Used: Forceps, Scalpel Selective Debridement - Tissue Removed: Unviable tissue    Wound Assessment and Plan  Wound Therapy - Assess/Plan/Recommendations Wound Therapy - Clinical Statement: Tunnel in center of wound bed increasing in size this session after debridement. Overall wound bed appearance continues to improve. This patient will benefit from continued hydrotherapy for selective removal of unviable tissue, to decrease bioburden, and promote wound bed healing. Wound Therapy - Functional Problem List: Decreased overall mobility and tolerance for sitting OOB Factors Delaying/Impairing Wound Healing: Infection - systemic/local, Immobility Hydrotherapy Plan: Debridement, Dressing change, Patient/family education, Pulsatile lavage with suction Wound Therapy - Frequency: 6X / week Wound Therapy - Follow Up Recommendations: dressing changes by family/patient  Wound Therapy Goals- Improve the function of patient's integumentary system by progressing the wound(s) through the phases of wound healing (inflammation - proliferation - remodeling) by: Wound Therapy Goals - Improve the function of patient's integumentary system by progressing the wound(s) through the phases of wound healing by: Decrease Necrotic Tissue to: 0 Decrease Necrotic Tissue - Progress: Progressing toward goal Increase Granulation Tissue to: 100 Increase Granulation Tissue - Progress: Progressing toward goal Goals/treatment plan/discharge plan were made with and agreed upon by patient/family: Yes Time For Goal Achievement: 7 days Wound Therapy - Potential for Goals: Good  Goals will be updated until maximal potential achieved or discharge criteria met.  Discharge  criteria: when goals achieved, discharge from hospital, MD decision/surgical intervention, no progress towards goals, refusal/missing three consecutive treatments without notification or medical reason.  GP     Charges PT  Wound Care Charges $Wound Debridement up to 20 cm: < or equal to 20 cm $PT Hydrotherapy Dressing: 2 dressings $PT PLS Gun and Tip: 1 Supply $PT Hydrotherapy Visit: 1 Visit       [name] 10/21/2020, 2:31 PM

## 2020-10-22 DIAGNOSIS — A419 Sepsis, unspecified organism: Secondary | ICD-10-CM | POA: Diagnosis not present

## 2020-10-22 DIAGNOSIS — R6520 Severe sepsis without septic shock: Secondary | ICD-10-CM | POA: Diagnosis not present

## 2020-10-22 LAB — BASIC METABOLIC PANEL
Anion gap: 9 (ref 5–15)
BUN: 17 mg/dL (ref 8–23)
CO2: 30 mmol/L (ref 22–32)
Calcium: 9.1 mg/dL (ref 8.9–10.3)
Chloride: 104 mmol/L (ref 98–111)
Creatinine, Ser: 1.01 mg/dL (ref 0.61–1.24)
GFR, Estimated: >60 mL/min (ref >60)
Glucose, Bld: 104 mg/dL — ABNORMAL HIGH (ref 70–99)
Potassium: 4.3 mmol/L (ref 3.5–5.1)
Sodium: 143 mmol/L (ref 135–145)

## 2020-10-22 LAB — MAGNESIUM: Magnesium: 1.8 mg/dL (ref 1.7–2.4)

## 2020-10-22 LAB — GLUCOSE, CAPILLARY: Glucose-Capillary: 90 mg/dL (ref 70–99)

## 2020-10-22 MED ORDER — MAGNESIUM SULFATE 2 GM/50ML IV SOLN
2.0000 g | Freq: Once | INTRAVENOUS | Status: AC
  Administered 2020-10-22: 2 g via INTRAVENOUS
  Filled 2020-10-22: qty 50

## 2020-10-22 MED ORDER — PHENYLEPHRINE-MINERAL OIL-PET 0.25-14-74.9 % RE OINT
1.0000 "application " | TOPICAL_OINTMENT | Freq: Two times a day (BID) | RECTAL | Status: DC | PRN
  Filled 2020-10-22: qty 57

## 2020-10-22 MED ORDER — HYDROCORTISONE 1 % EX CREA: TOPICAL_CREAM | Freq: Two times a day (BID) | CUTANEOUS | Status: DC | PRN

## 2020-10-22 MED ORDER — POLYETHYLENE GLYCOL 3350 17 G PO PACK
17.0000 g | PACK | Freq: Every day | ORAL | Status: DC
  Administered 2020-10-22 – 2020-10-26 (×3): 17 g via ORAL
  Filled 2020-10-22 (×6): qty 1

## 2020-10-22 NOTE — Consult Note (Signed)
[place] Nurse wound follow up Patient receiving care in [facility]N09 PT to perform hydrotherapy on MWF then  apply Santyl to the left buttock/sacrum wound in a nickel thick layer. Cover with a saline moistened gauze, then dry gauze or ABD pad.  Change daily.  PRIMARY RN TO DO ON DAYS PT NOT PERFORMING HYDROTHERAPY.  Cathlean Marseilles Tamala Julian, MSN, RN, [place], Lysle Pearl, [facility] Wound Treatment Associate Pager [phone]

## 2020-10-22 NOTE — TOC Progression Note (Addendum)
Transition of Care [facility]) - Progression Note    Patient Details  Name: Daniel [name] MRN: [id number] Date of Birth: 07-Sep-1940  Transition of Care [facility]) CM/SW Contact  Sharin Mons, RN Phone Number: 10/22/2020, 11:25 AM  Clinical Narrative:    Pt without SNF bed offers. Search expanded.  TOC team will continue to monitor and assist with needs....  Expected Discharge Plan: [place] Barriers to Discharge: Continued Medical Work up, No SNF bed  Expected Discharge Plan and Services Expected Discharge Plan: [place] In-house Referral: Clinical Social Work     Living arrangements for the past 2 months: Single Family Home                                       Social Determinants of Health (SDOH) Interventions    Readmission Risk Interventions No flowsheet data found.
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
## 2020-10-22 NOTE — Progress Notes (Signed)
Physical Therapy Wound Treatment Patient Details  Name: Daniel [name] MRN: [id number] Date of Birth: [birth year]  Today's Date: 10/22/2020 Time: 1134-1220 Time Calculation (min): 46 min  Subjective  Subjective Assessment Subjective: Pt pleasant and agreeable to hydrotherapy Patient and Family Stated Goals: None stated Date of Onset:  (Unknown) Prior Treatments:  (Dressing changes)  Pain Score:  Pt was unable to be premedicated this date. Pt reports burning pain with treatment, however was still able to tolerate the full session.  Wound Assessment  Pressure Injury 10/12/20 Sacrum Unstageable - Full thickness tissue loss in which the base of the injury is covered by slough (yellow, tan, gray, green or brown) and/or eschar (tan, brown or black) in the wound bed. (Active)  Dressing Type ABD;Barrier Film (skin prep);Gauze (Comment);Moist to moist;Santyl 10/22/20 1322  Dressing Changed;Clean;Dry;Intact 10/22/20 1322  Dressing Change Frequency Daily 10/22/20 1322  State of Healing Early/partial granulation 10/22/20 1322  Site / Wound Assessment Yellow;Pink 10/22/20 1322  % Wound base Red or Granulating 60% 10/22/20 1322  % Wound base Yellow/Fibrinous Exudate 40% 10/22/20 1322  % Wound base Black/Eschar 0% 10/22/20 1322  % Wound base Other/Granulation Tissue (Comment) 0% 10/22/20 1322  Peri-wound Assessment Intact 10/22/20 1322  Wound Length (cm) 4.5 cm 10/20/20 1300  Wound Width (cm) 5.5 cm 10/20/20 1300  Wound Depth (cm) 0.9 cm 10/20/20 1300  Wound Surface Area (cm^2) 24.75 cm^2 10/20/20 1300  Wound Volume (cm^3) 22.28 cm^3 10/20/20 1300  Tunneling (cm) 2 cm tunnel in center of wound. 10/20/20 1300  Undermining (cm) 0 10/20/20 1413  Margins Unattached edges (unapproximated) 10/22/20 1322  Drainage Amount Minimal 10/22/20 1322  Drainage Description Serosanguineous 10/22/20 1322  Treatment Debridement (Selective);Hydrotherapy (Pulse lavage);Packing (Saline gauze) 10/22/20 1322       Hydrotherapy Pulsed lavage therapy - wound location: Left buttock/sacrum Pulsed Lavage with Suction (psi): 12 psi Pulsed Lavage with Suction - Normal Saline Used: 1000 mL Pulsed Lavage Tip: Tip with splash shield Selective Debridement Selective Debridement - Location: Left buttock/sacrum Selective Debridement - Tools Used: Forceps, Scalpel Selective Debridement - Tissue Removed: Unviable tissue    Wound Assessment and Plan  Wound Therapy - Assess/Plan/Recommendations Wound Therapy - Clinical Statement: Overall wound bed appearance continues to improve. Feel the wound is appropriate for hydrotherapy intervention at a 3x/week frequency moving forward. Will plan to see pt MWF next week with nursing staff performing dressing changes in between. Wound Therapy - Functional Problem List: Decreased overall mobility and tolerance for sitting OOB Factors Delaying/Impairing Wound Healing: Infection - systemic/local, Immobility Hydrotherapy Plan: Debridement, Dressing change, Patient/family education, Pulsatile lavage with suction Wound Therapy - Frequency: 6X / week Wound Therapy - Follow Up Recommendations: dressing changes by family/patient  Wound Therapy Goals- Improve the function of patient's integumentary system by progressing the wound(s) through the phases of wound healing (inflammation - proliferation - remodeling) by: Wound Therapy Goals - Improve the function of patient's integumentary system by progressing the wound(s) through the phases of wound healing by: Decrease Necrotic Tissue to: 0 Decrease Necrotic Tissue - Progress: Progressing toward goal Increase Granulation Tissue to: 100 Increase Granulation Tissue - Progress: Progressing toward goal Goals/treatment plan/discharge plan were made with and agreed upon by patient/family: Yes Time For Goal Achievement: 7 days Wound Therapy - Potential for Goals: Good  Goals will be updated until maximal potential achieved or discharge  criteria met.  Discharge criteria: when goals achieved, discharge from hospital, MD decision/surgical intervention, no progress towards goals, refusal/missing three consecutive treatments without notification or medical  reason.  GP     Charges PT Wound Care Charges $Wound Debridement up to 20 cm: < or equal to 20 cm $PT Hydrotherapy Dressing: 2 dressings $PT PLS Gun and Tip: 1 Supply $PT Hydrotherapy Visit: 1 Visit       [name] 10/22/2020, 1:29 PM  Rolinda Roan, PT, DPT Acute Rehabilitation Services Pager: [phone] Office: [phone]

## 2020-10-23 ENCOUNTER — Inpatient Hospital Stay (HOSPITAL_COMMUNITY): Payer: Medicare Other

## 2020-10-23 DIAGNOSIS — A419 Sepsis, unspecified organism: Secondary | ICD-10-CM | POA: Diagnosis not present

## 2020-10-23 DIAGNOSIS — R6520 Severe sepsis without septic shock: Secondary | ICD-10-CM | POA: Diagnosis not present

## 2020-10-23 LAB — BASIC METABOLIC PANEL
Anion gap: 8 (ref 5–15)
BUN: 16 mg/dL (ref 8–23)
CO2: 29 mmol/L (ref 22–32)
Calcium: 9 mg/dL (ref 8.9–10.3)
Chloride: 104 mmol/L (ref 98–111)
Creatinine, Ser: 0.89 mg/dL (ref 0.61–1.24)
GFR, Estimated: >60 mL/min (ref >60)
Glucose, Bld: 98 mg/dL (ref 70–99)
Potassium: 3.8 mmol/L (ref 3.5–5.1)
Sodium: 141 mmol/L (ref 135–145)

## 2020-10-23 LAB — CBC
HCT: 39.3 % (ref 39.0–52.0)
Hemoglobin: 11.4 g/dL — ABNORMAL LOW (ref 13.0–17.0)
MCH: 25.2 pg — ABNORMAL LOW (ref 26.0–34.0)
MCHC: 29 g/dL — ABNORMAL LOW (ref 30.0–36.0)
MCV: 86.9 fL (ref 80.0–100.0)
Platelets: 276 10*3/uL (ref 150–400)
RBC: 4.52 MIL/uL (ref 4.22–5.81)
RDW: 17.6 % — ABNORMAL HIGH (ref 11.5–15.5)
WBC: 9.2 10*3/uL (ref 4.0–10.5)
nRBC: 0 % (ref 0.0–0.2)

## 2020-10-23 LAB — URINALYSIS, ROUTINE W REFLEX MICROSCOPIC
Bilirubin Urine: NEGATIVE
Glucose, UA: 50 mg/dL — AB
Hgb urine dipstick: NEGATIVE
Ketones, ur: NEGATIVE mg/dL
Leukocytes,Ua: NEGATIVE
Nitrite: NEGATIVE
Protein, ur: 100 mg/dL — AB
Specific Gravity, Urine: 1.009 (ref 1.005–1.030)
pH: 5 (ref 5.0–8.0)

## 2020-10-23 LAB — TROPONIN I (HIGH SENSITIVITY)
Troponin I (High Sensitivity): 28 ng/L — ABNORMAL HIGH (ref <18)
Troponin I (High Sensitivity): 23 ng/L — ABNORMAL HIGH (ref <18)

## 2020-10-23 LAB — GLUCOSE, CAPILLARY: Glucose-Capillary: 138 mg/dL — ABNORMAL HIGH (ref 70–99)

## 2020-10-23 LAB — MAGNESIUM: Magnesium: 1.7 mg/dL (ref 1.7–2.4)

## 2020-10-23 IMAGING — DX DG CHEST 1V PORT
1 series · 1 of 1 positions shown · non-contrast
Comparison: [DATE]

CLINICAL DATA: Hypoxia.

EXAM:
PORTABLE CHEST 1 VIEW

[chest]
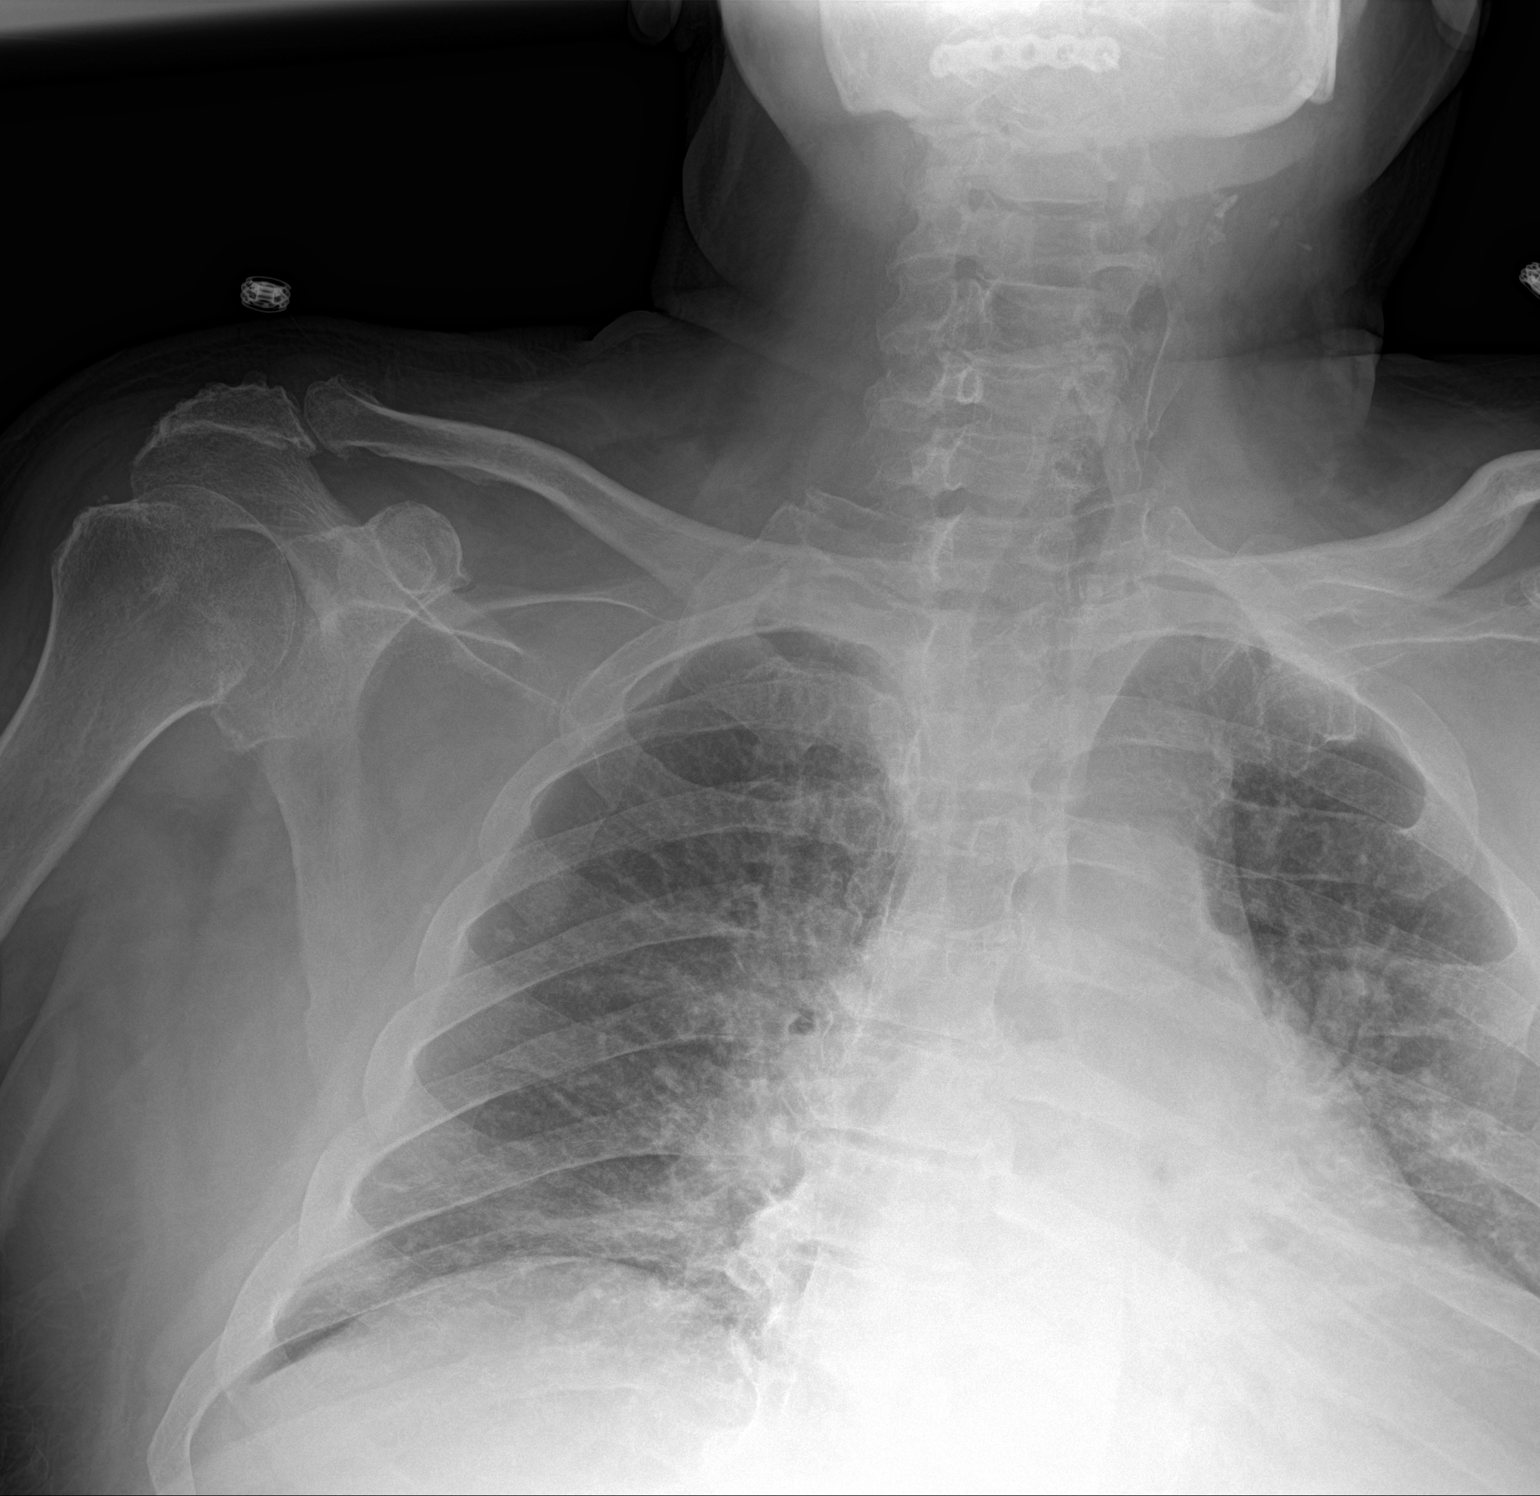

[1 of 1 positions shown; findings below may reference images not displayed]

FINDINGS: Lateral left lung base cut off the image. Lungs are hypoinflated
without focal airspace consolidation. There is minimal hazy
prominence of the central perihilar vessels suggesting mild degree
of vascular congestion. Mild stable cardiomegaly. Remainder of the
exam is unchanged.
IMPRESSION: Mild stable cardiomegaly with suggestion of minimal vascular
congestion.

## 2020-10-23 MED ORDER — FUROSEMIDE 10 MG/ML IJ SOLN
60.0000 mg | Freq: Once | INTRAMUSCULAR | Status: AC
  Administered 2020-10-23: 60 mg via INTRAVENOUS
  Filled 2020-10-23: qty 6

## 2020-10-23 MED ORDER — HYDROCERIN EX CREA
TOPICAL_CREAM | CUTANEOUS | Status: DC | PRN
  Filled 2020-10-23: qty 113

## 2020-10-23 NOTE — Progress Notes (Signed)
Patient placed on cardiac monitoring as per MD
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
## 2020-10-24 ENCOUNTER — Inpatient Hospital Stay (HOSPITAL_COMMUNITY): Payer: Medicare Other

## 2020-10-24 LAB — URINALYSIS, ROUTINE W REFLEX MICROSCOPIC
Bilirubin Urine: NEGATIVE
Glucose, UA: NEGATIVE mg/dL
Ketones, ur: NEGATIVE mg/dL
Leukocytes,Ua: NEGATIVE
Nitrite: NEGATIVE
Protein, ur: NEGATIVE mg/dL
RBC / HPF: >50 RBC/hpf — ABNORMAL HIGH (ref 0–5)
Specific Gravity, Urine: 1.009 (ref 1.005–1.030)
pH: 5 (ref 5.0–8.0)

## 2020-10-24 LAB — CBC WITH DIFFERENTIAL/PLATELET
Abs Immature Granulocytes: 0.05 10*3/uL (ref 0.00–0.07)
Basophils Absolute: 0 10*3/uL (ref 0.0–0.1)
Basophils Relative: 0 %
Eosinophils Absolute: 0.2 10*3/uL (ref 0.0–0.5)
Eosinophils Relative: 2 %
HCT: 40.8 % (ref 39.0–52.0)
Hemoglobin: 11.9 g/dL — ABNORMAL LOW (ref 13.0–17.0)
Immature Granulocytes: 1 %
Lymphocytes Relative: 22 %
Lymphs Abs: 2.1 10*3/uL (ref 0.7–4.0)
MCH: 25.3 pg — ABNORMAL LOW (ref 26.0–34.0)
MCHC: 29.2 g/dL — ABNORMAL LOW (ref 30.0–36.0)
MCV: 86.8 fL (ref 80.0–100.0)
Monocytes Absolute: 0.8 10*3/uL (ref 0.1–1.0)
Monocytes Relative: 9 %
Neutro Abs: 6.4 10*3/uL (ref 1.7–7.7)
Neutrophils Relative %: 66 %
Platelets: 274 10*3/uL (ref 150–400)
RBC: 4.7 MIL/uL (ref 4.22–5.81)
RDW: 18 % — ABNORMAL HIGH (ref 11.5–15.5)
WBC: 9.6 10*3/uL (ref 4.0–10.5)
nRBC: 0 % (ref 0.0–0.2)

## 2020-10-24 LAB — GLUCOSE, CAPILLARY: Glucose-Capillary: 118 mg/dL — ABNORMAL HIGH (ref 70–99)

## 2020-10-24 IMAGING — DX DG CHEST 1V PORT
1 series · 1 of 1 positions shown · non-contrast
Comparison: [DATE]

CLINICAL DATA: Shortness of breath

EXAM:
PORTABLE CHEST 1 VIEW

[chest ap]
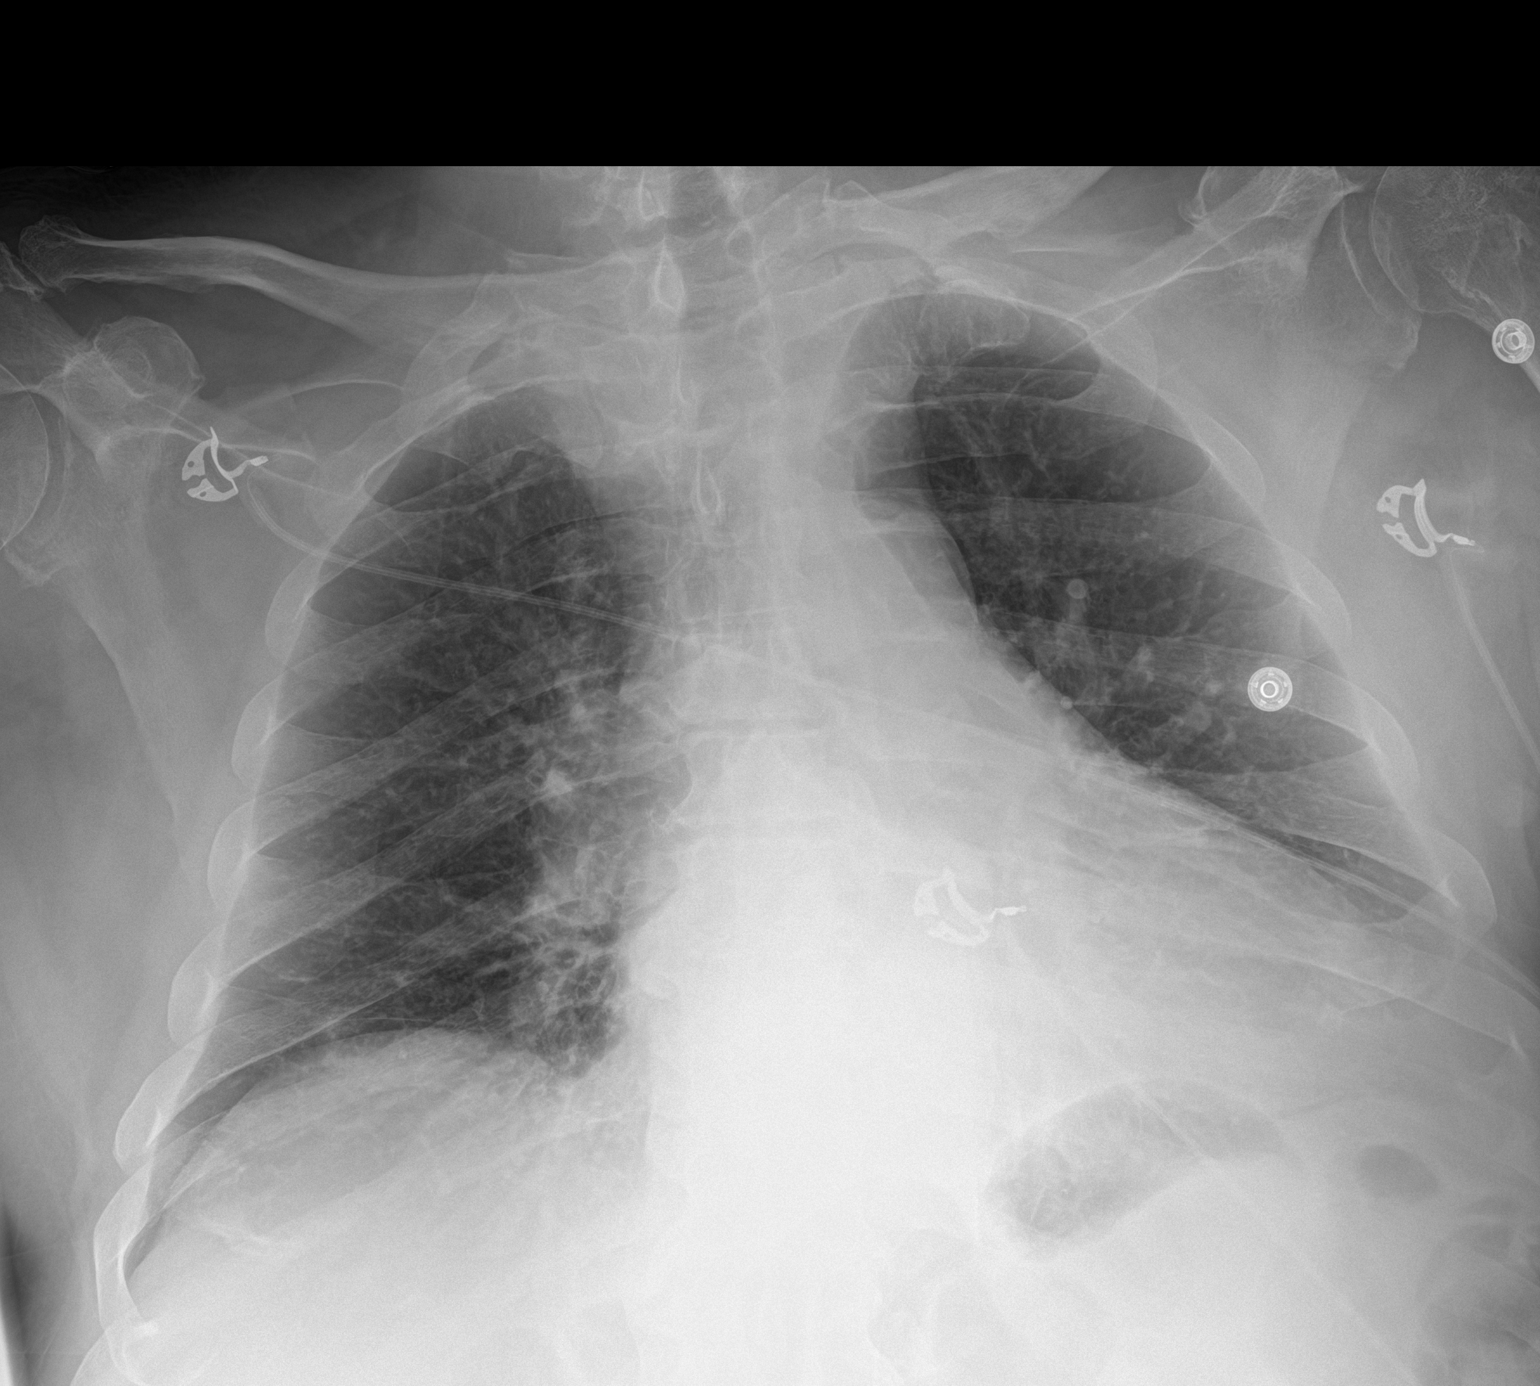

[1 of 1 positions shown; findings below may reference images not displayed]

FINDINGS: The cardiomediastinal silhouette is unchanged and enlarged in
contour.Unchanged vascular prominence without overt edema. No
pleural effusion. No pneumothorax. No acute pleuroparenchymal
abnormality. LEFT retrocardiac atelectasis. Visualized abdomen is
unremarkable. Multilevel degenerative changes of the thoracic spine.
IMPRESSION: Unchanged vascular congestion without overt edema.

## 2020-10-24 MED ORDER — METOPROLOL TARTRATE 50 MG PO TABS
100.0000 mg | ORAL_TABLET | Freq: Two times a day (BID) | ORAL | Status: DC
  Administered 2020-10-24 – 2020-10-25 (×2): 100 mg via ORAL
  Filled 2020-10-24 (×2): qty 2

## 2020-10-24 MED ORDER — FUROSEMIDE 10 MG/ML IJ SOLN
60.0000 mg | Freq: Once | INTRAMUSCULAR | Status: AC
  Administered 2020-10-24: 60 mg via INTRAVENOUS
  Filled 2020-10-24: qty 6
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
## 2020-10-24 NOTE — Progress Notes (Signed)
Bedside shift report complete. Received patient awake,alert/orientedx4 and able to verbalize needs. NAD noted; respirations even on 1L O2 via nasal cannula. Continuous pulse ox in place. Right sided weakness noted. Foley in place c/d/i. Dressing to heel c/d/i. Dressing to sacrum noted. Some movement to extremities noted. Bilateral knee sleeves noted. Patient refusing Prevalon booties at this time. Whiteboard updated. All safety measures in place and personal belongings within reach.

## 2020-10-24 NOTE — TOC Progression Note (Signed)
Transition of Care [facility]) - Progression Note    Patient Details  Name: Daniel [name] MRN: [id number] Date of Birth: Mar 04, 1941  Transition of Care [facility]) CM/SW [place], [place] Phone Number: 10/24/2020, 9:00 AM  Clinical Narrative:     Pt has no bed offers at this time.   Expected Discharge Plan: [place] Barriers to Discharge: Continued Medical Work up, No SNF bed  Expected Discharge Plan and Services Expected Discharge Plan: [place] In-house Referral: Clinical Social Work     Living arrangements for the past 2 months: Single Family Home                                       Social Determinants of Health (SDOH) Interventions    Readmission Risk Interventions No flowsheet data found.  Emeterio Reeve, Latanya Presser, [place] Social Worker [phone]

## 2020-10-24 NOTE — Progress Notes (Signed)
UA collected and sent to lab via tube system.

## 2020-10-25 DIAGNOSIS — A419 Sepsis, unspecified organism: Secondary | ICD-10-CM | POA: Diagnosis not present

## 2020-10-25 DIAGNOSIS — R6520 Severe sepsis without septic shock: Secondary | ICD-10-CM | POA: Diagnosis not present

## 2020-10-25 LAB — CBC WITH DIFFERENTIAL/PLATELET
Abs Immature Granulocytes: 0.03 10*3/uL (ref 0.00–0.07)
Basophils Absolute: 0 10*3/uL (ref 0.0–0.1)
Basophils Relative: 0 %
Eosinophils Absolute: 0.2 10*3/uL (ref 0.0–0.5)
Eosinophils Relative: 2 %
HCT: 41.4 % (ref 39.0–52.0)
Hemoglobin: 12.2 g/dL — ABNORMAL LOW (ref 13.0–17.0)
Immature Granulocytes: 0 %
Lymphocytes Relative: 20 %
Lymphs Abs: 2 10*3/uL (ref 0.7–4.0)
MCH: 25.8 pg — ABNORMAL LOW (ref 26.0–34.0)
MCHC: 29.5 g/dL — ABNORMAL LOW (ref 30.0–36.0)
MCV: 87.7 fL (ref 80.0–100.0)
Monocytes Absolute: 0.6 10*3/uL (ref 0.1–1.0)
Monocytes Relative: 6 %
Neutro Abs: 7 10*3/uL (ref 1.7–7.7)
Neutrophils Relative %: 72 %
Platelets: 296 10*3/uL (ref 150–400)
RBC: 4.72 MIL/uL (ref 4.22–5.81)
RDW: 18 % — ABNORMAL HIGH (ref 11.5–15.5)
WBC: 9.8 10*3/uL (ref 4.0–10.5)
nRBC: 0 % (ref 0.0–0.2)

## 2020-10-25 LAB — BASIC METABOLIC PANEL
Anion gap: 13 (ref 5–15)
BUN: 29 mg/dL — ABNORMAL HIGH (ref 8–23)
CO2: 31 mmol/L (ref 22–32)
Calcium: 9.3 mg/dL (ref 8.9–10.3)
Chloride: 97 mmol/L — ABNORMAL LOW (ref 98–111)
Creatinine, Ser: 1.34 mg/dL — ABNORMAL HIGH (ref 0.61–1.24)
GFR, Estimated: 54 mL/min — ABNORMAL LOW (ref >60)
Glucose, Bld: 113 mg/dL — ABNORMAL HIGH (ref 70–99)
Potassium: 3.3 mmol/L — ABNORMAL LOW (ref 3.5–5.1)
Sodium: 141 mmol/L (ref 135–145)

## 2020-10-25 LAB — GLUCOSE, CAPILLARY
Glucose-Capillary: 99 mg/dL (ref 70–99)
Glucose-Capillary: 117 mg/dL — ABNORMAL HIGH (ref 70–99)
Glucose-Capillary: 121 mg/dL — ABNORMAL HIGH (ref 70–99)
Glucose-Capillary: 134 mg/dL — ABNORMAL HIGH (ref 70–99)

## 2020-10-25 LAB — MAGNESIUM: Magnesium: 1.8 mg/dL (ref 1.7–2.4)

## 2020-10-25 MED ORDER — HYDROCODONE-ACETAMINOPHEN 5-325 MG PO TABS
1.0000 | ORAL_TABLET | Freq: Three times a day (TID) | ORAL | Status: DC
  Administered 2020-10-25 – 2020-10-30 (×14): 1 via ORAL
  Filled 2020-10-25 (×14): qty 1

## 2020-10-25 MED ORDER — LACTATED RINGERS IV BOLUS
500.0000 mL | Freq: Once | INTRAVENOUS | Status: AC
  Administered 2020-10-25: 500 mL via INTRAVENOUS

## 2020-10-25 MED ORDER — POTASSIUM CHLORIDE CRYS ER 20 MEQ PO TBCR
40.0000 meq | EXTENDED_RELEASE_TABLET | Freq: Two times a day (BID) | ORAL | Status: DC
  Administered 2020-10-25 – 2020-10-26 (×3): 40 meq via ORAL
  Filled 2020-10-25 (×3): qty 2

## 2020-10-25 MED ORDER — APIXABAN 5 MG PO TABS
5.0000 mg | ORAL_TABLET | Freq: Two times a day (BID) | ORAL | Status: DC
  Administered 2020-10-25 – 2020-11-01 (×15): 5 mg via ORAL
  Filled 2020-10-25 (×15): qty 1

## 2020-10-25 MED ORDER — METOPROLOL TARTRATE 50 MG PO TABS
50.0000 mg | ORAL_TABLET | Freq: Once | ORAL | Status: AC
  Administered 2020-10-25: 50 mg via ORAL
  Filled 2020-10-25: qty 1

## 2020-10-25 MED ORDER — METOPROLOL TARTRATE 50 MG PO TABS: 50.0000 mg | ORAL_TABLET | Freq: Once | ORAL | Status: DC

## 2020-10-25 MED ORDER — MAGNESIUM SULFATE 2 GM/50ML IV SOLN
2.0000 g | Freq: Once | INTRAVENOUS | Status: AC
  Administered 2020-10-25: 2 g via INTRAVENOUS
  Filled 2020-10-25: qty 50
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
## 2020-10-25 NOTE — Progress Notes (Signed)
IMTS Update:   I was paged due to yellow MEWS ~8:20pm, patient's HR was in the high 120's and BP was soft in the 657'Q systolic.  - Discontinued Lopressor 100mg  BID  - Gave 500cc LR bolus + Lopressor 50mg  PO once  - Will continue to monitor vital signs Q2-3 hrs and adjust morning dose as needed  Jeralyn Bennett, MD 10/25/2020, 8:45 PM Pager: [phone]
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
## 2020-10-25 NOTE — Progress Notes (Signed)
See MAR for new orders.     10/25/20 2021  Vitals  Temp 99.6 F (37.6 C)  Temp Source Oral  BP 107/76  MAP (mmHg) 86  BP Location Left Arm  BP Method Automatic  Patient Position (if appropriate) Lying  Pulse Rate (!) 127  Pulse Rate Source Monitor  Resp 16  MEWS COLOR  MEWS Score Color Yellow  Oxygen Therapy  SpO2 98 %  O2 Device Nasal Cannula  O2 Flow Rate (L/min) 2 L/min  Pain Assessment  Pain Scale 0-10  Pain Score 0  Glasgow Coma Scale  Eye Opening 4  Best Verbal Response (NON-intubated) 5  Best Motor Response 6  Glasgow Coma Scale Score 15  MEWS Score  MEWS Temp 0  MEWS Systolic 0  MEWS Pulse 2  MEWS RR 0  MEWS LOC 0  MEWS Score 2  Provider Notification  Provider Name/Title Speakman, Intern  Date Provider Notified 10/25/20  Time Provider Notified 2026  Notification Type Page  Notification Reason Other (Comment) (yellow MEWS)  Provider response See new orders  Date of Provider Response 10/25/20  Time of Provider Response 2040

## 2020-10-25 NOTE — Progress Notes (Signed)
Physical Therapy Wound Treatment Patient Details  Name: Daniel [name] MRN: [id number] Date of Birth: October 14, 1940  Today's Date: 10/25/2020 Time: 4431-5400 Time Calculation (min): 31 min  Subjective  Subjective Assessment Subjective: Pt pleasant and agreeable to hydrotherapy Patient and Family Stated Goals: None stated Date of Onset:  (Unknown) Prior Treatments:  (Dressing changes)  Pain Score:  Pt was premedicated and overall tolerated well without complaints.   Wound Assessment  Pressure Injury 10/12/20 Sacrum Unstageable - Full thickness tissue loss in which the base of the injury is covered by slough (yellow, tan, gray, green or brown) and/or eschar (tan, brown or black) in the wound bed. (Active)  Dressing Type ABD;Barrier Film (skin prep);Gauze (Comment);Moist to moist;Santyl 10/25/20 1128  Dressing Changed;Clean;Dry;Intact 10/25/20 1128  Dressing Change Frequency Daily 10/25/20 1128  State of Healing Non-healing 10/24/20 1316  Site / Wound Assessment Pink;Yellow 10/25/20 1128  % Wound base Red or Granulating 65% 10/25/20 1128  % Wound base Yellow/Fibrinous Exudate 35% 10/25/20 1128  % Wound base Black/Eschar 0% 10/25/20 1128  % Wound base Other/Granulation Tissue (Comment) 0% 10/25/20 1128  Peri-wound Assessment Intact 10/25/20 1128  Wound Length (cm) 4.5 cm 10/20/20 1300  Wound Width (cm) 5.5 cm 10/20/20 1300  Wound Depth (cm) 0.9 cm 10/20/20 1300  Wound Surface Area (cm^2) 24.75 cm^2 10/20/20 1300  Wound Volume (cm^3) 22.28 cm^3 10/20/20 1300  Tunneling (cm) 2 cm tunnel in center of wound. 10/20/20 1300  Undermining (cm) 0 10/20/20 1413  Margins Unattached edges (unapproximated) 10/25/20 1128  Drainage Amount Minimal 10/25/20 1128  Drainage Description Serosanguineous 10/25/20 1128  Treatment Debridement (Selective);Hydrotherapy (Pulse lavage);Packing (Saline gauze) 10/25/20 1128      Hydrotherapy Pulsed lavage therapy - wound location: Left buttock/sacrum Pulsed  Lavage with Suction (psi): 12 psi Pulsed Lavage with Suction - Normal Saline Used: 1000 mL Pulsed Lavage Tip: Tip with splash shield Selective Debridement Selective Debridement - Location: Left buttock/sacrum Selective Debridement - Tools Used: Forceps, Scalpel Selective Debridement - Tissue Removed: Unviable tissue    Wound Assessment and Plan  Wound Therapy - Assess/Plan/Recommendations Wound Therapy - Clinical Statement: Wound bed continues to appear improved with more healthy tissue visible. Will continue hydrotherapy for selective removal of unviable tissue, to decrease bioburden and promote wound bed healing. Wound Therapy - Functional Problem List: Decreased overall mobility and tolerance for sitting OOB Factors Delaying/Impairing Wound Healing: Infection - systemic/local, Immobility Hydrotherapy Plan: Debridement, Dressing change, Patient/family education, Pulsatile lavage with suction Wound Therapy - Frequency: 6X / week Wound Therapy - Follow Up Recommendations: dressing changes by family/patient  Wound Therapy Goals- Improve the function of patient's integumentary system by progressing the wound(s) through the phases of wound healing (inflammation - proliferation - remodeling) by: Wound Therapy Goals - Improve the function of patient's integumentary system by progressing the wound(s) through the phases of wound healing by: Decrease Necrotic Tissue to: 0 Decrease Necrotic Tissue - Progress: Progressing toward goal Increase Granulation Tissue to: 100 Increase Granulation Tissue - Progress: Progressing toward goal Goals/treatment plan/discharge plan were made with and agreed upon by patient/family: Yes Time For Goal Achievement: 7 days Wound Therapy - Potential for Goals: Good  Goals will be updated until maximal potential achieved or discharge criteria met.  Discharge criteria: when goals achieved, discharge from hospital, MD decision/surgical intervention, no progress towards  goals, refusal/missing three consecutive treatments without notification or medical reason.  GP     Charges PT Wound Care Charges $Wound Debridement up to 20 cm: < or equal to 20  cm $PT Hydrotherapy Dressing: 1 dressing $PT PLS Gun and Tip: 1 Supply $PT Hydrotherapy Visit: 1 Visit       [name] 10/25/2020, 11:37 AM  Rolinda Roan, PT, DPT Acute Rehabilitation Services Pager: [phone] Office: [phone]

## 2020-10-26 ENCOUNTER — Inpatient Hospital Stay (HOSPITAL_COMMUNITY): Payer: Medicare Other

## 2020-10-26 DIAGNOSIS — A419 Sepsis, unspecified organism: Secondary | ICD-10-CM | POA: Diagnosis not present

## 2020-10-26 DIAGNOSIS — R6520 Severe sepsis without septic shock: Secondary | ICD-10-CM | POA: Diagnosis not present

## 2020-10-26 LAB — CBC WITH DIFFERENTIAL/PLATELET
Abs Immature Granulocytes: 0.02 10*3/uL (ref 0.00–0.07)
Basophils Absolute: 0 10*3/uL (ref 0.0–0.1)
Basophils Relative: 0 %
Eosinophils Absolute: 0.2 10*3/uL (ref 0.0–0.5)
Eosinophils Relative: 2 %
HCT: 38.8 % — ABNORMAL LOW (ref 39.0–52.0)
Hemoglobin: 11.6 g/dL — ABNORMAL LOW (ref 13.0–17.0)
Immature Granulocytes: 0 %
Lymphocytes Relative: 25 %
Lymphs Abs: 2.3 10*3/uL (ref 0.7–4.0)
MCH: 26 pg (ref 26.0–34.0)
MCHC: 29.9 g/dL — ABNORMAL LOW (ref 30.0–36.0)
MCV: 86.8 fL (ref 80.0–100.0)
Monocytes Absolute: 0.8 10*3/uL (ref 0.1–1.0)
Monocytes Relative: 8 %
Neutro Abs: 5.9 10*3/uL (ref 1.7–7.7)
Neutrophils Relative %: 65 %
Platelets: 230 10*3/uL (ref 150–400)
RBC: 4.47 MIL/uL (ref 4.22–5.81)
RDW: 18.1 % — ABNORMAL HIGH (ref 11.5–15.5)
WBC: 9.2 10*3/uL (ref 4.0–10.5)
nRBC: 0 % (ref 0.0–0.2)

## 2020-10-26 LAB — BASIC METABOLIC PANEL
Anion gap: 8 (ref 5–15)
BUN: 27 mg/dL — ABNORMAL HIGH (ref 8–23)
CO2: 31 mmol/L (ref 22–32)
Calcium: 8.8 mg/dL — ABNORMAL LOW (ref 8.9–10.3)
Chloride: 100 mmol/L (ref 98–111)
Creatinine, Ser: 1.04 mg/dL (ref 0.61–1.24)
GFR, Estimated: >60 mL/min (ref >60)
Glucose, Bld: 93 mg/dL (ref 70–99)
Potassium: 3.6 mmol/L (ref 3.5–5.1)
Sodium: 139 mmol/L (ref 135–145)

## 2020-10-26 LAB — GLUCOSE, CAPILLARY: Glucose-Capillary: 96 mg/dL (ref 70–99)

## 2020-10-26 LAB — MAGNESIUM: Magnesium: 1.9 mg/dL (ref 1.7–2.4)

## 2020-10-26 IMAGING — CT CT CHEST W/O CM
2 of 3 series · 14 of 36 positions shown, 17 images · non-contrast
Comparison: Chest radiograph [DATE]

CLINICAL DATA: Pleural effusion

EXAM:
CT CHEST WITHOUT CONTRAST
TECHNIQUE: Multidetector CT imaging of the chest was performed following the
standard protocol without IV contrast.

[Series 3: chest w/o 2mm st · axial · non-contrast · 0.90mm/px · z∈[+1366,+1664]mm · 11 of 175 slices shown, 14 images]
[im 13/175  mediastinal]
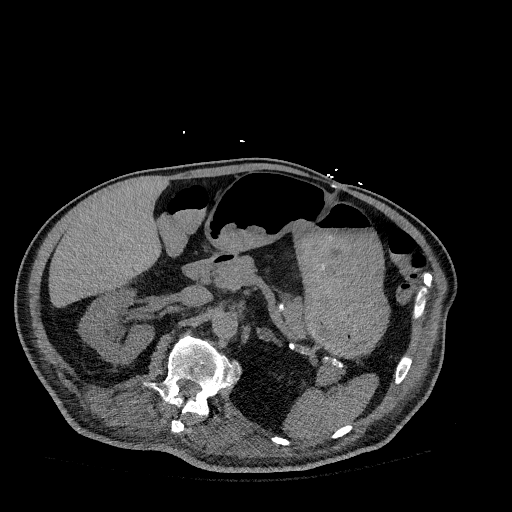
[im 13/175  lung]
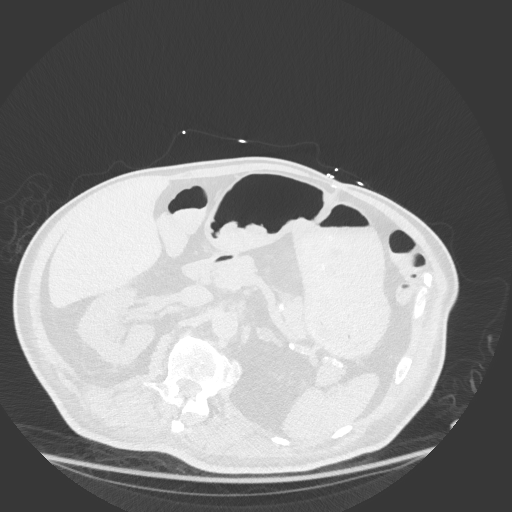
[im 26/175  lung]
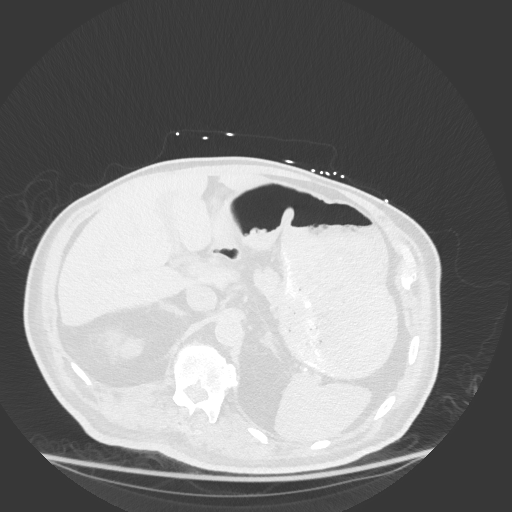
[im 39/175  lung]
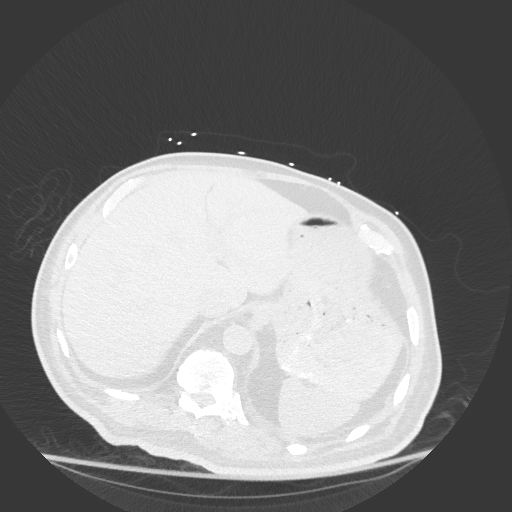
[im 59/175  lung]
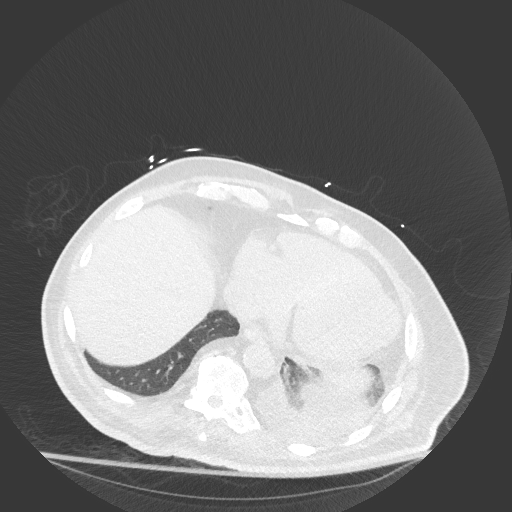
[im 71/175  mediastinal]
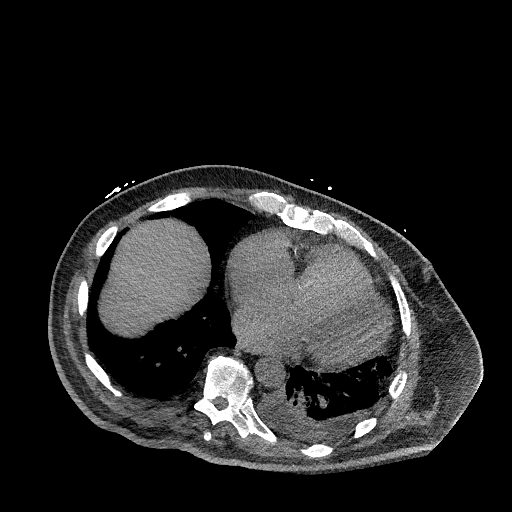
[im 71/175  lung]
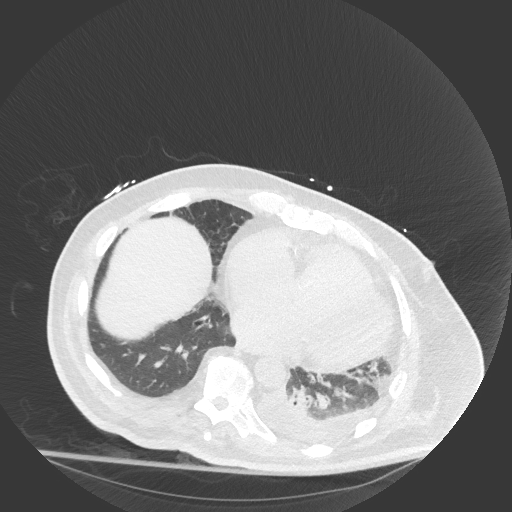
[im 91/175  lung]
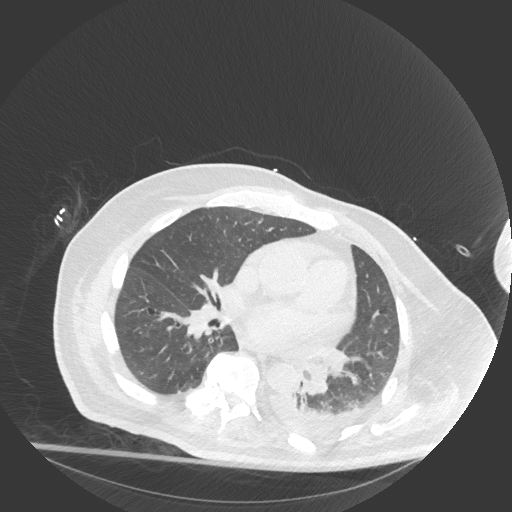
[im 104/175  lung]
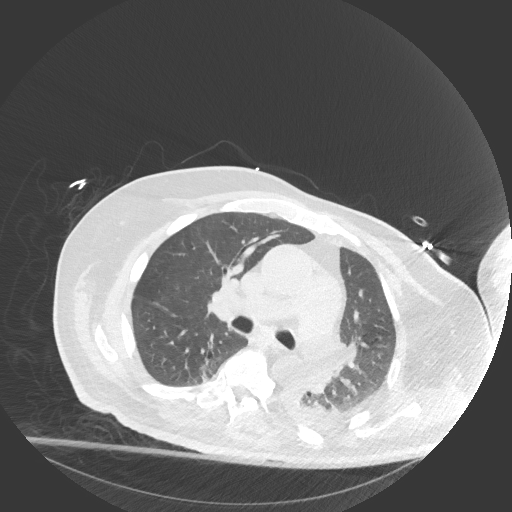
[im 117/175  lung]
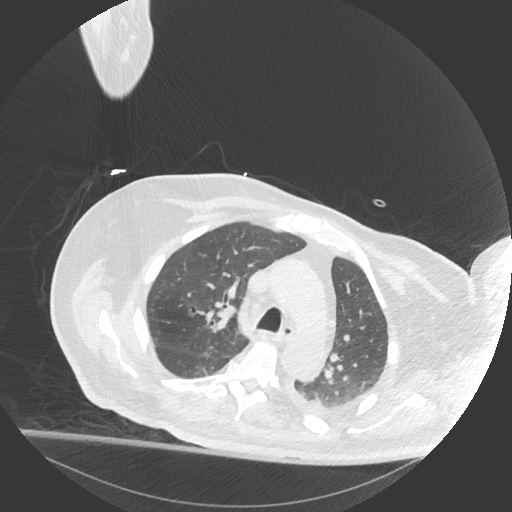
[im 136/175  mediastinal]
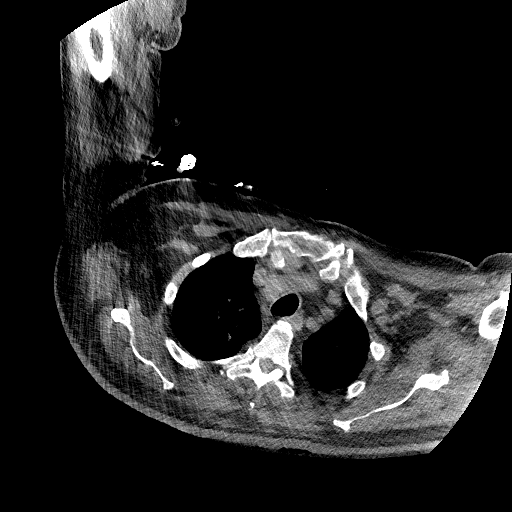
[im 136/175  lung]
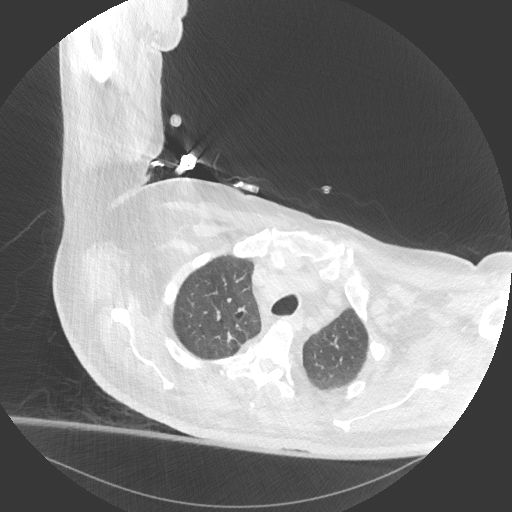
[im 149/175  lung]
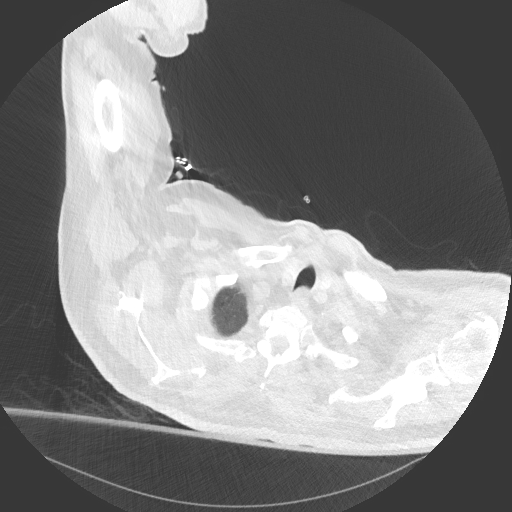
[im 162/175  lung]
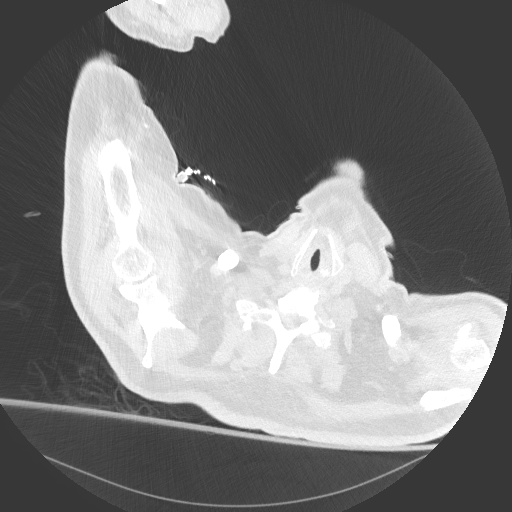

[Series 6: chest w/o 2mm st cor · coronal · non-contrast · 0.80mm/px · 3 of 146 slices shown]
[im 30/146  lung]
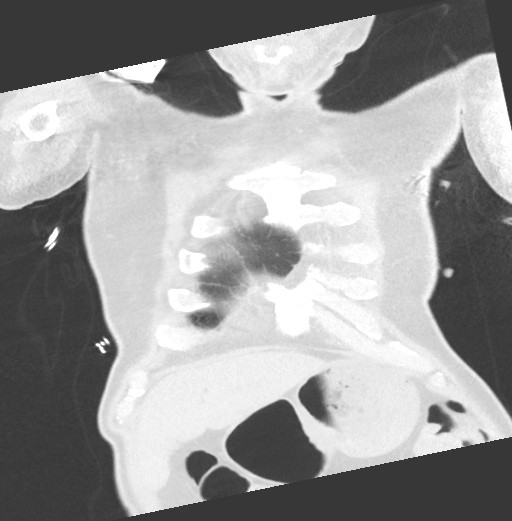
[im 59/146  lung]
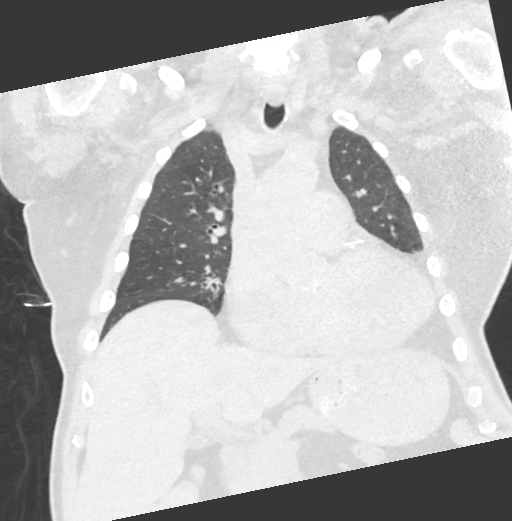
[im 88/146  lung]
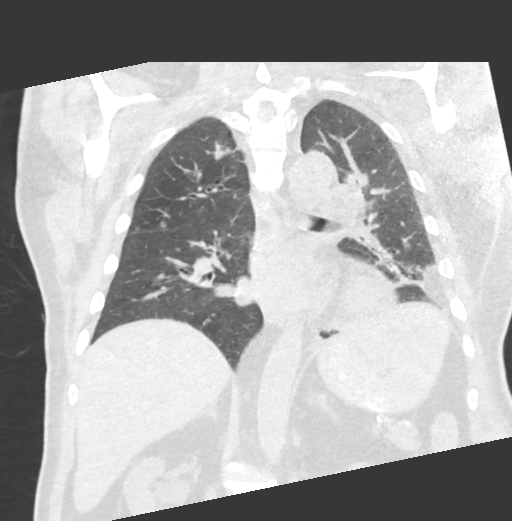

[14 of 36 positions shown; findings below may reference images not displayed]

FINDINGS: Cardiovascular: There is no appreciable thoracic aortic aneurysm.
Visualized great vessels appear somewhat tortuous but otherwise
unremarkable. Note that the right innominate and left common carotid
arteries arise as a common trunk, an anatomic variant. There are
foci of aortic atherosclerosis. There are scattered foci of
calcification in the aortic valve. There are foci of coronary artery
calcification. Heart is mildly enlarged. No pericardial effusion or
pericardial thickening.

Mediastinum/Nodes: Thyroid appears unremarkable. There are scattered
subcentimeter mediastinal lymph nodes. There is a subcarinal lymph
node measuring 1.7 x 1.5 cm. No other lymph node enlargement is
appreciable. No esophageal lesions are evident.

Lungs/Pleura: There is a fairly small left pleural effusion with
atelectasis and patchy consolidation in the left lower lobe. There
is mild scarring in the right middle lobe. There is bilateral lower
lobe and right middle lobe bronchiectatic change. There is a focal
area of opacity in the right upper lobe posterior segment measuring
1.4 x 0.9 cm. There is no pneumothorax. Trachea is patent. Right
major bronchial structures appear patent. There is a mucous in the
distal left main bronchus with apparent mucous in the upper and
lower lobe bronchi on the left.

Upper Abdomen: There is upper abdominal aortic atherosclerosis.
There is extensive splenic artery calcification. There is a 1 mm
calculus in the posterior mid right kidney. There is mild fullness
of the right renal collecting system.

Musculoskeletal: There is degenerative change in the thoracic spine.
No blastic or lytic bone lesions. No chest wall lesions.
IMPRESSION: 1. Fairly small left pleural effusion. Consolidation with
atelectasis left lower lobe, likely due to a degree of pneumonia in
the left base.

2. Apparent mucous plugging in the periphery of the left main
bronchus and extending into the upper and lower lobe bronchi. Note
that there is persistent aeration distal to these areas of apparent
mucous plugging.

3. Bronchiectatic change in each lower lobe and in the right middle
lobe.

4. Lentiform nodular opacity in the posterior segment right upper
lobe measuring 1 x 0.9 cm. Consider one of the following in 3 months
for both low-risk and high-risk individuals: (a) repeat chest CT,
(b) follow-up PET-CT, or (c) tissue sampling. This recommendation
follows the consensus statement: Guidelines for Management of
Incidental Pulmonary Nodules Detected on CT Images: From the
potentially could represent atelectasis as opposed to an actual
nodular lesion, follow-up in 3 months is a reasonable consideration
for assessment of this area in the right upper lobe.

5. Prominent subcarinal lymph node measuring 1.7 x 1.5 cm. Question
reactive etiology given the parenchymal lung changes. Scattered
subcentimeter lymph nodes elsewhere in the mediastinum.

6. There are foci of aortic and coronary artery calcification. Mild
calcification in the aortic valve. Mild cardiomegaly.

7.  1 mm calculus posterior mid right kidney.

8. Mild fullness of the right renal collecting system of uncertain
etiology.

Aortic Atherosclerosis ([KM]-[KM]).

## 2020-10-26 MED ORDER — LACTATED RINGERS IV BOLUS
500.0000 mL | Freq: Once | INTRAVENOUS | Status: AC
  Administered 2020-10-26: 500 mL via INTRAVENOUS

## 2020-10-26 MED ORDER — DILTIAZEM HCL-DEXTROSE 125-5 MG/125ML-% IV SOLN (PREMIX)
5.0000 mg/h | INTRAVENOUS | Status: DC
  Administered 2020-10-26: 5 mg/h via INTRAVENOUS
  Administered 2020-10-28: 7.5 mg/h via INTRAVENOUS
  Filled 2020-10-26 (×4): qty 125

## 2020-10-26 MED ORDER — METOPROLOL TARTRATE 50 MG PO TABS
50.0000 mg | ORAL_TABLET | Freq: Two times a day (BID) | ORAL | Status: DC
  Administered 2020-10-26: 50 mg via ORAL
  Filled 2020-10-26: qty 1

## 2020-10-26 MED ORDER — MAGNESIUM SULFATE IN D5W 1-5 GM/100ML-% IV SOLN
1.0000 g | Freq: Once | INTRAVENOUS | Status: AC
  Administered 2020-10-26: 1 g via INTRAVENOUS
  Filled 2020-10-26: qty 100
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
## 2020-10-26 NOTE — Progress Notes (Signed)
Central tele notified this RN that patient had run of bradycardic beats. HR back to 97 at this time. Paged MD Speakman to update.

## 2020-10-26 NOTE — Progress Notes (Signed)
MD Speakman at bedside assessing patient at this time.

## 2020-10-26 NOTE — Progress Notes (Signed)
IMTS Update:  10/25/20 8:44pm: I was paged due to yellow MEWS ~8:30pm, patient's HR was in the high 120's and BP was soft in the 453'M systolic.  - Discontinued Lopressor 100mg  BID  - Gave 500cc LR bolus + Lopressor 50mg  PO once  - Will continue to monitor vital signs q2-3 hours and adjust morning dose as needed   10/26/20 12:45am: I was paged by RN that patient was noted on telemetry monitoring to have bradycardia down to the 30's. Telemetry showed which showed about 10 seconds of irregularly irregular bradycardia around midnight tonight. He also had 16 beats of VT at 7:44pm.   At the bedside, he denied having any SOB, light-headedness, dizziness, CP, palpitations, confusion, fatigue or any other symptoms. He says he's been told his heart has been irregular "all his life".  Objective:  Vital signs in last 24 hours: Vitals:   10/25/20 1521 10/25/20 2021 10/25/20 2204 10/26/20 0018  BP: 115/74 107/76 110/77 95/64  Pulse:  (!) 127 (!) 105 95  Resp: 19 16 16 16   Temp: 97.6 F (36.4 C) 99.6 F (37.6 C) 98.2 F (36.8 C) 98.8 F (37.1 C)  TempSrc: Oral Oral Oral Oral  SpO2: 90% 98% 96% 100%  Weight:      Height:       12:45am: General: Laying down comfortably, in no acute distress.  HENT: Slightly dry mucus membranes.  Cardiovascular: Rate is normal. Rhythm is irregularly irregular. Difficult to appreciate heart sounds over rhonchi.  Respiratory: There are diffuse rhonchi throughout, L > R, loudest at the left lung base. Saturating 100% on 2L [place] without increased work of breathing or active cough, otherwise CTA.  Neurological: Awake, alert, and oriented.   Assessment/Plan:  Principal Problem:   Severe sepsis (HCC) Active Problems:   Atrial fibrillation (HCC)   Decubitus ulcer of sacral region, unstageable ([place])   Left knee pain   Pressure injury of deep tissue of left heel   Pressure injury of deep tissue of right heel  # Sinus Node Dysfunction  # Atrial Fibrillation /  A-Flutter  # Non-sustained VT  Telemetry shows persistent A-Fib. Rates improved from the 120's earlier yesterday evening to 90's early this morning. He did have short bouts of VT and bradycardia as above, although remained completely asymptomatic. RN reports urine has been darker than usual tonight, pressures have been soft (s/p 50mg  Lopressor) and mucus membranes are slightly dry. Wonder whether dehydration in the setting of attempted diuresis yesterday may be contributing. His lungs do sound very rhonchorous today although he had no evidence of edema on last CXR 10/24/20 and denies increased SOB or cough, stating he is able to expectorate phlegm well when this occurs.  - Given lack of symptoms, will continue with current management  - Continuous telemetry monitoring + vitals q2hrs  - If he becomes truly hypotensive, plan to give another 500cc bolus LR - Holding morning Lopressor dose currently, although will adjust as needed - Consider CXR if lung findings are new compared to yesterday   Jeralyn Bennett, MD 10/26/2020, 1:19 AM Pager: [phone] After 5pm on weekdays and 1pm on weekends: On Call pager [phone]
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
## 2020-10-26 NOTE — Progress Notes (Addendum)
Pt transferred to 4E from 5N. Telemetry box applied, CCMD notified. VSS. CHG bath complete. Patient stated 0/10 pain scale. Patient oriented to room and staff. Call bell in reach.  Daymon Larsen, RN

## 2020-10-27 DIAGNOSIS — R6520 Severe sepsis without septic shock: Secondary | ICD-10-CM | POA: Diagnosis not present

## 2020-10-27 DIAGNOSIS — A419 Sepsis, unspecified organism: Secondary | ICD-10-CM | POA: Diagnosis not present

## 2020-10-27 LAB — CBC WITH DIFFERENTIAL/PLATELET
Abs Immature Granulocytes: 0.04 10*3/uL (ref 0.00–0.07)
Basophils Absolute: 0 10*3/uL (ref 0.0–0.1)
Basophils Relative: 0 %
Eosinophils Absolute: 0.2 10*3/uL (ref 0.0–0.5)
Eosinophils Relative: 2 %
HCT: 38.3 % — ABNORMAL LOW (ref 39.0–52.0)
Hemoglobin: 11.3 g/dL — ABNORMAL LOW (ref 13.0–17.0)
Immature Granulocytes: 0 %
Lymphocytes Relative: 19 %
Lymphs Abs: 1.8 10*3/uL (ref 0.7–4.0)
MCH: 26 pg (ref 26.0–34.0)
MCHC: 29.5 g/dL — ABNORMAL LOW (ref 30.0–36.0)
MCV: 88 fL (ref 80.0–100.0)
Monocytes Absolute: 0.9 10*3/uL (ref 0.1–1.0)
Monocytes Relative: 9 %
Neutro Abs: 6.5 10*3/uL (ref 1.7–7.7)
Neutrophils Relative %: 70 %
Platelets: 242 10*3/uL (ref 150–400)
RBC: 4.35 MIL/uL (ref 4.22–5.81)
RDW: 18 % — ABNORMAL HIGH (ref 11.5–15.5)
WBC: 9.4 10*3/uL (ref 4.0–10.5)
nRBC: 0 % (ref 0.0–0.2)

## 2020-10-27 LAB — BASIC METABOLIC PANEL
Anion gap: 4 — ABNORMAL LOW (ref 5–15)
BUN: 25 mg/dL — ABNORMAL HIGH (ref 8–23)
CO2: 35 mmol/L — ABNORMAL HIGH (ref 22–32)
Calcium: 9.1 mg/dL (ref 8.9–10.3)
Chloride: 103 mmol/L (ref 98–111)
Creatinine, Ser: 0.89 mg/dL (ref 0.61–1.24)
GFR, Estimated: >60 mL/min (ref >60)
Glucose, Bld: 139 mg/dL — ABNORMAL HIGH (ref 70–99)
Potassium: 4.6 mmol/L (ref 3.5–5.1)
Sodium: 142 mmol/L (ref 135–145)

## 2020-10-27 LAB — MAGNESIUM: Magnesium: 2 mg/dL (ref 1.7–2.4)

## 2020-10-27 NOTE — Progress Notes (Signed)
Physical Therapy Wound Treatment Patient Details  Name: Daniel [name] MRN: [id number] Date of Birth: 01-15-1941  Today's Date: 10/27/2020 Time: 4696-2952 Time Calculation (min): 35 min  Subjective  Subjective Assessment Subjective: Pt pleasant and agreeable to hydrotherapy Patient and Family Stated Goals: None stated Date of Onset:  (Unknown) Prior Treatments:  (Dressing changes)  Pain Score:  Pt did not complain of pain throughout session.   Wound Assessment  Pressure Injury 10/12/20 Sacrum Unstageable - Full thickness tissue loss in which the base of the injury is covered by slough (yellow, tan, gray, green or brown) and/or eschar (tan, brown or black) in the wound bed. (Active)  Dressing Type ABD;Barrier Film (skin prep);Gauze (Comment);Moist to moist;Santyl 10/27/20 1506  Dressing Changed;Clean;Dry;Intact 10/27/20 1506  Dressing Change Frequency Daily 10/27/20 1506  State of Healing Early/partial granulation 10/27/20 1506  Site / Wound Assessment Pink;Yellow 10/27/20 1506  % Wound base Red or Granulating 65% 10/27/20 1506  % Wound base Yellow/Fibrinous Exudate 35% 10/27/20 1506  % Wound base Black/Eschar 0% 10/27/20 1506  % Wound base Other/Granulation Tissue (Comment) 0% 10/27/20 1506  Peri-wound Assessment Intact 10/27/20 1506  Wound Length (cm) 4.5 cm 10/20/20 1300  Wound Width (cm) 5.5 cm 10/20/20 1300  Wound Depth (cm) 0.9 cm 10/20/20 1300  Wound Surface Area (cm^2) 24.75 cm^2 10/20/20 1300  Wound Volume (cm^3) 22.28 cm^3 10/20/20 1300  Tunneling (cm) 2 cm tunnel in center of wound. 10/20/20 1300  Undermining (cm) 0 10/20/20 1413  Margins Unattached edges (unapproximated) 10/27/20 1506  Drainage Amount None 10/27/20 1506  Drainage Description Serosanguineous 10/27/20 1506  Treatment Debridement (Selective);Hydrotherapy (Pulse lavage);Packing (Saline gauze) 10/27/20 1506      Hydrotherapy Pulsed lavage therapy - wound location: Left buttock/sacrum Pulsed Lavage  with Suction (psi): 12 psi Pulsed Lavage with Suction - Normal Saline Used: 1000 mL Pulsed Lavage Tip: Tip with splash shield Selective Debridement Selective Debridement - Location: Left buttock/sacrum Selective Debridement - Tools Used: Forceps, Scalpel Selective Debridement - Tissue Removed: Unviable tissue    Wound Assessment and Plan  Wound Therapy - Assess/Plan/Recommendations Wound Therapy - Clinical Statement: Wound bed appears unchanged this session. Will continue hydrotherapy MWF for selective removal of unviable tissue, to decrease bioburden and promote wound bed healing. Wound Therapy - Functional Problem List: Decreased overall mobility and tolerance for sitting OOB Factors Delaying/Impairing Wound Healing: Infection - systemic/local, Immobility Hydrotherapy Plan: Debridement, Dressing change, Patient/family education, Pulsatile lavage with suction Wound Therapy - Frequency: 6X / week Wound Therapy - Follow Up Recommendations: dressing changes by family/patient  Wound Therapy Goals- Improve the function of patient's integumentary system by progressing the wound(s) through the phases of wound healing (inflammation - proliferation - remodeling) by: Wound Therapy Goals - Improve the function of patient's integumentary system by progressing the wound(s) through the phases of wound healing by: Decrease Necrotic Tissue to: 0 Decrease Necrotic Tissue - Progress: Progressing toward goal Increase Granulation Tissue to: 100 Increase Granulation Tissue - Progress: Progressing toward goal Goals/treatment plan/discharge plan were made with and agreed upon by patient/family: Yes Time For Goal Achievement: 7 days Wound Therapy - Potential for Goals: Good  Goals will be updated until maximal potential achieved or discharge criteria met.  Discharge criteria: when goals achieved, discharge from hospital, MD decision/surgical intervention, no progress towards goals, refusal/missing three  consecutive treatments without notification or medical reason.  GP     Charges PT Wound Care Charges $Wound Debridement up to 20 cm: < or equal to 20 cm $PT Hydrotherapy Dressing:  1 dressing $PT PLS Gun and Tip: 1 Supply $PT Hydrotherapy Visit: 1 Visit       [name] 10/27/2020, 3:09 PM  Rolinda Roan, PT, DPT Acute Rehabilitation Services Pager: [phone] Office: [phone]
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
## 2020-10-28 MED ORDER — DILTIAZEM HCL-DEXTROSE 125-5 MG/125ML-% IV SOLN (PREMIX): 5.0000 mg/h | INTRAVENOUS | Status: DC

## 2020-10-28 MED ORDER — DILTIAZEM HCL 60 MG PO TABS
60.0000 mg | ORAL_TABLET | Freq: Four times a day (QID) | ORAL | Status: DC
  Administered 2020-10-28 (×2): 60 mg via ORAL
  Filled 2020-10-28 (×2): qty 1

## 2020-10-28 MED ORDER — DILTIAZEM HCL 60 MG PO TABS
90.0000 mg | ORAL_TABLET | Freq: Four times a day (QID) | ORAL | Status: AC
  Administered 2020-10-29 – 2020-10-31 (×9): 90 mg via ORAL
  Filled 2020-10-28 (×9): qty 2
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
## 2020-10-28 NOTE — Plan of Care (Signed)

## 2020-10-28 NOTE — TOC Progression Note (Addendum)
Transition of Care [facility]) - Progression Note    Patient Details  Name: Daniel [name] MRN: [id number] Date of Birth: [birth year]  Transition of Care [facility]) CM/SW Contact  Curlene Labrum, RN Phone Number: 10/28/2020, 12:54 PM  Clinical Narrative:    Patient was placed on the Difficult to Place Team today since the patient has complex health needs and currently does not have any bed offers for rehabilitation admission.  I met with the patient at the bedside to discuss transitions of care needs and the patient is agreeable to SNF placement before returning to home with his daughter and grandson, who he was living with prior to this admission.   CM will expand the search for SNF beds for this patient in the hub.  I called the patient's daughter, Daniel [name], on the phone and was unable to reach her at this time to discuss transitions of care - but I left her a voice message and will follow up with her about admission to an accepting [facility] facility.  CM and MSW with DTP Team will continue to follow the patient for SNF admission.  10/28/2020 - CM left a voice message with numerous SNF facilities to follow up including: [facility] - declined [place] - no beds at this time but will continue to follow [place] - left message [facility] - left message [place], CM will reach out to [facility] and will have them view recent clinicals  CM with DTP Team will continue to follow for [facility] placement and follow up with the patient's daughter, Daniel [name] for transitions of care needs.  10/28/2020 14:53 - I spoke with Daniel [name], CM at [facility] - the facility is willing to review the patient for bed offer consideration at this time as long as hydrotherapy is not needed.  I sent a message to Dr. Evette Doffing and asked for clarification on the wound care orders in case a bed was offered for SNF placement.  [place] facilities are unable to provide hydrotherapy at this time.   Expected Discharge  Plan: [place] Barriers to Discharge: Continued Medical Work up, SNF Pending bed offer  Expected Discharge Plan and Services Expected Discharge Plan: [place] In-house Referral: Clinical Social Work Discharge Planning Services: CM Consult Post Acute Care Choice: [place] Living arrangements for the past 2 months: Apartment (Patient was living with his daughter prior to admission this hospital this visit.)                                       Social Determinants of Health (SDOH) Interventions    Readmission Risk Interventions No flowsheet data found.

## 2020-10-29 NOTE — Progress Notes (Signed)
Physical Therapy Wound Treatment Patient Details  Name: DARRNELL MANGIARACINA MRN: [id number] Date of Birth: 1941-02-13  Today's Date: 10/29/2020 Time: 8110-3159 Time Calculation (min): 25 min  Subjective  Subjective Assessment Subjective: Pt pleasant and agreeable to hydrotherapy Patient and Family Stated Goals: None stated Date of Onset:  (Unknown) Prior Treatments:  (Dressing changes)  Pain Score:  premedicated  2-4/10  Wound Assessment  Pressure Injury 10/12/20 Sacrum Unstageable - Full thickness tissue loss in which the base of the injury is covered by slough (yellow, tan, gray, green or brown) and/or eschar (tan, brown or black) in the wound bed. (Active)  Wound Image   10/20/20 1413  Dressing Type ABD;Barrier Film (skin prep);Gauze (Comment);Normal saline moist dressing;Moist to dry;Santyl 10/29/20 1409  Dressing Changed 10/29/20 1409  Dressing Change Frequency Daily 10/29/20 1409  State of Healing Early/partial granulation 10/29/20 1409  Site / Wound Assessment Pale;Pink;Red;Yellow 10/29/20 1409  % Wound base Red or Granulating 70% 10/29/20 1409  % Wound base Yellow/Fibrinous Exudate 30% 10/29/20 1409  % Wound base Black/Eschar 0% 10/29/20 1409  % Wound base Other/Granulation Tissue (Comment) 0% 10/29/20 1409  Peri-wound Assessment Intact 10/28/20 0915  Wound Length (cm) 4.5 cm 10/20/20 1300  Wound Width (cm) 5.5 cm 10/20/20 1300  Wound Depth (cm) 0.9 cm 10/20/20 1300  Wound Surface Area (cm^2) 24.75 cm^2 10/20/20 1300  Wound Volume (cm^3) 22.28 cm^3 10/20/20 1300  Tunneling (cm) 2 cm tunnel in center of wound. 10/20/20 1300  Undermining (cm) 0 10/20/20 1413  Margins Unattached edges (unapproximated) 10/28/20 0915  Drainage Amount None 10/28/20 2012  Drainage Description Serosanguineous 10/27/20 1506  Treatment Other (Comment) 10/28/20 0915   Santyl applied to wound bed prior to applying dressing.    Hydrotherapy Pulsed lavage therapy - wound location: Left  buttock/sacrum Pulsed Lavage with Suction (psi): 8 psi Pulsed Lavage with Suction - Normal Saline Used: 1000 mL Pulsed Lavage Tip: Tip with splash shield Selective Debridement Selective Debridement - Location: Left buttock/sacrum Selective Debridement - Tools Used: Forceps, Scalpel Selective Debridement - Tissue Removed: Unviable tissue    Wound Assessment and Plan  Wound Therapy - Assess/Plan/Recommendations Wound Therapy - Clinical Statement: Wound bed appears unchanged this session. Will continue hydrotherapy MWF for selective removal of unviable tissue, to decrease bioburden and promote wound bed healing. Wound Therapy - Functional Problem List: Decreased overall mobility and tolerance for sitting OOB Factors Delaying/Impairing Wound Healing: Infection - systemic/local, Immobility Hydrotherapy Plan: Debridement, Dressing change, Patient/family education, Pulsatile lavage with suction Wound Therapy - Frequency: 6X / week Wound Therapy - Follow Up Recommendations: dressing changes by family/patient  Wound Therapy Goals- Improve the function of patient's integumentary system by progressing the wound(s) through the phases of wound healing (inflammation - proliferation - remodeling) by: Wound Therapy Goals - Improve the function of patient's integumentary system by progressing the wound(s) through the phases of wound healing by: Decrease Necrotic Tissue to: 0 Decrease Necrotic Tissue - Progress: Progressing toward goal Increase Granulation Tissue to: 100 Increase Granulation Tissue - Progress: Progressing toward goal Goals/treatment plan/discharge plan were made with and agreed upon by patient/family: Yes Time For Goal Achievement: 7 days Wound Therapy - Potential for Goals: Good  Goals will be updated until maximal potential achieved or discharge criteria met.  Discharge criteria: when goals achieved, discharge from hospital, MD decision/surgical intervention, no progress towards goals,  refusal/missing three consecutive treatments without notification or medical reason.  GP     Charges PT Wound Care Charges $Wound Debridement up to 20 cm: <  or equal to 20 cm $ Wound Debridement each add'l 20 sqcm: 1 $PT PLS Gun and Tip: 1 Supply $PT Hydrotherapy Visit: 1 Visit        V  10/29/2020, 2:16 PM  
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
## 2020-10-29 NOTE — TOC Progression Note (Signed)
Transition of Care [facility]) - Progression Note    Patient Details  Name: Daniel [name] MRN: [id number] Date of Birth: 1940-05-23  Transition of Care [facility]) CM/SW [place], RN Phone Number: 10/29/2020, 1:09 PM  Clinical Narrative:    Case management spoke with [facility] and [facility] and both facilities have offered a bed to the patient for admission.  I called and spoke with the patient's daughter, Daniel [name], and she was given Medicare choice regarding the patient and she chose [place].  I spoke with Beckie Busing, CM at the phone and she is aware and is starting insurance authorization on the patient today.  CM will continue to follow the patient for SNF placement at [facility].   Expected Discharge Plan: [place] Barriers to Discharge: Continued Medical Work up, SNF Pending bed offer  Expected Discharge Plan and Services Expected Discharge Plan: [place] In-house Referral: Clinical Social Work Discharge Planning Services: CM Consult Post Acute Care Choice: [place] Living arrangements for the past 2 months: Apartment (Patient was living with his daughter prior to admission this hospital this visit.)                                       Social Determinants of Health (SDOH) Interventions    Readmission Risk Interventions No flowsheet data found.
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
## 2020-10-30 MED ORDER — HYDROCODONE-ACETAMINOPHEN 5-325 MG PO TABS: 1.0000 | ORAL_TABLET | Freq: Four times a day (QID) | ORAL | Status: DC | PRN

## 2020-10-30 MED ORDER — ACETAMINOPHEN 325 MG PO TABS
650.0000 mg | ORAL_TABLET | Freq: Four times a day (QID) | ORAL | Status: DC
  Administered 2020-10-30 – 2020-11-01 (×10): 650 mg via ORAL
  Filled 2020-10-30 (×8): qty 2

## 2020-10-30 MED ORDER — DILTIAZEM HCL ER COATED BEADS 180 MG PO CP24
360.0000 mg | ORAL_CAPSULE | Freq: Every day | ORAL | Status: DC
  Administered 2020-10-31 – 2020-11-01 (×2): 360 mg via ORAL
  Filled 2020-10-30 (×2): qty 2
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
## 2020-11-01 LAB — RESP PANEL BY RT-PCR (FLU A&B, COVID) ARPGX2
Influenza A by PCR: NEGATIVE
Influenza B by PCR: NEGATIVE
SARS Coronavirus 2 by RT PCR: NEGATIVE

## 2020-11-01 MED ORDER — DILTIAZEM HCL ER COATED BEADS 360 MG PO CP24
360.0000 mg | ORAL_CAPSULE | Freq: Every day | ORAL | 0 refills | Status: AC
Start: 2020-11-02 — End: ?

## 2020-11-01 MED ORDER — POLYETHYLENE GLYCOL 3350 17 G PO PACK: 17.0000 g | PACK | Freq: Every day | ORAL | 0 refills | Status: AC

## 2020-11-01 MED ORDER — ZINC OXIDE 40 % EX OINT: TOPICAL_OINTMENT | Freq: Two times a day (BID) | CUTANEOUS | Status: AC | PRN

## 2020-11-01 MED ORDER — APIXABAN 5 MG PO TABS: 5.0000 mg | ORAL_TABLET | Freq: Two times a day (BID) | ORAL | Status: AC

## 2020-11-01 NOTE — Progress Notes (Signed)
Patient [name] via Biomedical scientist with [facility]. EMS alert and orin. With no complaints and with belongings.

## 2020-11-01 NOTE — Progress Notes (Addendum)
1:30pm: Patient will go to [facility] via [place] has been scheduled for pickup at 4pm. The number to call for report is [phone].  RN aware of discharge plan.  12:30pm: CSW received approval from [facility] for admission to [facility].  CSW spoke with Benancio Deeds at [facility] who states the patient can come to the facility today after he receives a new COVID test.  CSW spoke with Rene Kocher, RN who will request MD sign new COVID test order.  8:30am: CSW uploaded clinicals to [facility] portal to initiate insurance authorization.  Madilyn Fireman, MSW, LCSW Transitions of Care  Clinical Social Worker II [phone]
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
## 2020-11-01 NOTE — Consult Note (Signed)
   [facility] Inpatient Consult   11/01/2020  NYJAH DENIO 1940/07/11 [id number]  [employer] [employer] [[facility]O] Patient: [employer]  Reviewed for length of stay and disposition and patient is transitioning to a skilled nursing facility.  No [facility] Care Management follow up due to transition/dispostion of SNF.  Natividad Brood, RN BSN [facility] Liaison  [phone] business mobile phone Toll free office [phone]  Fax number: [phone] [email or web address]

## 2020-11-01 NOTE — TOC Transition Note (Signed)
Transition of Care [facility]) - CM/SW Discharge Note   Patient Details  Name: CONNELL BOGNAR MRN: [id number] Date of Birth: 03-08-1941  Transition of Care [facility]) CM/SW Contact:  Curlene Labrum, RN Phone Number: 11/01/2020, 1:03 PM   Clinical Narrative:    Case management spoke with Madilyn Fireman, MSW and the patient has an available bed at [facility] and has received insurance authorization and approval for admission to the facility.  I contacted Dr. Evette Doffing and he is aware that the patient will need a discharge summary and COVID screen before transferring to the facility today.    CM called and spoke to the patient's daughter Lewie Chamber and updated her that the patient would be transferring to the facility this afternoon after the COVID screen was complete.   Final next level of care: [place] Barriers to Discharge: Continued Medical Work up, SNF Pending bed offer   Patient Goals and CMS Choice Patient states their goals for this hospitalization and ongoing recovery are:: Patient is agreeable to SNF placement before returning home. CMS Medicare.gov Compare Post Acute Care list provided to:: Patient Choice offered to / list presented to : Patient  Discharge Placement                       Discharge Plan and Services In-house Referral: Clinical Social Work Discharge Planning Services: CM Consult Post Acute Care Choice: Skilled Nursing Facility                               Social Determinants of Health (SDOH) Interventions     Readmission Risk Interventions No flowsheet data found.

## 2020-11-01 NOTE — Progress Notes (Signed)
Physical Therapy Wound Treatment Patient Details  Name: Daniel [name] MRN: [id number] Date of Birth: June 04, 1940  Today's Date: 11/01/2020 Time: 0930-1009 Time Calculation (min): 39 min  Subjective  Subjective Assessment Subjective: Pt pleasant and agreeable to hydrotherapy Patient and Family Stated Goals: None stated Date of Onset:  (Unknown) Prior Treatments:  (Dressing changes)  Pain Score:  Pt did not report any pain throughout session.  Wound Assessment  Pressure Injury 10/12/20 Sacrum Unstageable - Full thickness tissue loss in which the base of the injury is covered by slough (yellow, tan, gray, green or brown) and/or eschar (tan, brown or black) in the wound bed. (Active)  Dressing Type ABD;Barrier Film (skin prep);Gauze (Comment);Moist to moist;Santyl 11/01/20 1130  Dressing Changed;Clean;Dry;Intact 11/01/20 1130  Dressing Change Frequency Daily 11/01/20 1130  State of Healing Early/partial granulation 11/01/20 1130  Site / Wound Assessment Pink;Yellow 11/01/20 1130  % Wound base Red or Granulating 70% 11/01/20 1130  % Wound base Yellow/Fibrinous Exudate 30% 11/01/20 1130  % Wound base Black/Eschar 0% 11/01/20 1130  % Wound base Other/Granulation Tissue (Comment) 0% 11/01/20 1130  Peri-wound Assessment Maceration;Pink 11/01/20 1130  Wound Length (cm) 4.5 cm 10/20/20 1300  Wound Width (cm) 5.5 cm 10/20/20 1300  Wound Depth (cm) 0.9 cm 10/20/20 1300  Wound Surface Area (cm^2) 24.75 cm^2 10/20/20 1300  Wound Volume (cm^3) 22.28 cm^3 10/20/20 1300  Tunneling (cm) 0 10/31/20 0945  Undermining (cm) 0 10/31/20 0945  Margins Unattached edges (unapproximated) 11/01/20 1130  Drainage Amount Minimal 11/01/20 1130  Drainage Description Serosanguineous 11/01/20 1130  Treatment Debridement (Selective);Hydrotherapy (Pulse lavage);Packing (Saline gauze) 11/01/20 1130      Hydrotherapy Pulsed lavage therapy - wound location: Left buttock/sacrum Pulsed Lavage with Suction (psi): 8  psi Pulsed Lavage with Suction - Normal Saline Used: 1000 mL Pulsed Lavage Tip: Tip with splash shield Selective Debridement Selective Debridement - Location: Left buttock/sacrum Selective Debridement - Tools Used: Forceps, Scalpel Selective Debridement - Tissue Removed: Unviable tissue    Wound Assessment and Plan  Wound Therapy - Assess/Plan/Recommendations Wound Therapy - Clinical Statement: Wound bed appears unchanged this session. Will continue hydrotherapy MWF for selective removal of unviable tissue, to decrease bioburden and promote wound bed healing. Wound Therapy - Functional Problem List: Decreased overall mobility and tolerance for sitting OOB Factors Delaying/Impairing Wound Healing: Infection - systemic/local, Immobility Hydrotherapy Plan: Debridement, Dressing change, Patient/family education, Pulsatile lavage with suction Wound Therapy - Frequency: 6X / week Wound Therapy - Follow Up Recommendations: dressing changes by family/patient  Wound Therapy Goals- Improve the function of patient's integumentary system by progressing the wound(s) through the phases of wound healing (inflammation - proliferation - remodeling) by: Wound Therapy Goals - Improve the function of patient's integumentary system by progressing the wound(s) through the phases of wound healing by: Decrease Necrotic Tissue to: 0 Decrease Necrotic Tissue - Progress: Progressing toward goal Increase Granulation Tissue to: 100 Increase Granulation Tissue - Progress: Progressing toward goal Goals/treatment plan/discharge plan were made with and agreed upon by patient/family: Yes Time For Goal Achievement: 7 days Wound Therapy - Potential for Goals: Good  Goals will be updated until maximal potential achieved or discharge criteria met.  Discharge criteria: when goals achieved, discharge from hospital, MD decision/surgical intervention, no progress towards goals, refusal/missing three consecutive treatments  without notification or medical reason.  GP     Charges PT Wound Care Charges $Wound Debridement up to 20 cm: < or equal to 20 cm $PT Hydrotherapy Dressing: 2 dressings $PT PLS Gun and Tip:  1 Supply $PT Hydrotherapy Visit: 1 Visit       [name] 11/01/2020, 11:34 AM  Rolinda Roan, PT, DPT Acute Rehabilitation Services Pager: [phone] Office: [phone]
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
## 2020-11-02 DIAGNOSIS — Z79899 Other long term (current) drug therapy: Secondary | ICD-10-CM | POA: Diagnosis not present

## 2020-11-02 DIAGNOSIS — I499 Cardiac arrhythmia, unspecified: Secondary | ICD-10-CM | POA: Diagnosis not present

## 2020-11-02 DIAGNOSIS — I4901 Ventricular fibrillation: Secondary | ICD-10-CM | POA: Diagnosis not present

## 2020-11-02 DIAGNOSIS — L8990 Pressure ulcer of unspecified site, unspecified stage: Secondary | ICD-10-CM | POA: Diagnosis not present

## 2020-11-02 DIAGNOSIS — Z743 Need for continuous supervision: Secondary | ICD-10-CM | POA: Diagnosis not present

## 2020-11-02 DIAGNOSIS — Z7901 Long term (current) use of anticoagulants: Secondary | ICD-10-CM | POA: Diagnosis not present

## 2020-11-02 DIAGNOSIS — M1993 Secondary osteoarthritis, unspecified site: Secondary | ICD-10-CM | POA: Diagnosis not present

## 2020-11-02 DIAGNOSIS — R911 Solitary pulmonary nodule: Secondary | ICD-10-CM | POA: Diagnosis not present

## 2020-11-02 DIAGNOSIS — G931 Anoxic brain damage, not elsewhere classified: Secondary | ICD-10-CM | POA: Diagnosis not present

## 2020-11-02 DIAGNOSIS — I469 Cardiac arrest, cause unspecified: Secondary | ICD-10-CM | POA: Diagnosis not present

## 2020-11-02 DIAGNOSIS — J9602 Acute respiratory failure with hypercapnia: Secondary | ICD-10-CM | POA: Diagnosis not present

## 2020-11-02 DIAGNOSIS — J90 Pleural effusion, not elsewhere classified: Secondary | ICD-10-CM | POA: Diagnosis not present

## 2020-11-02 DIAGNOSIS — R7881 Bacteremia: Secondary | ICD-10-CM | POA: Diagnosis not present

## 2020-11-02 DIAGNOSIS — R2681 Unsteadiness on feet: Secondary | ICD-10-CM | POA: Diagnosis not present

## 2020-11-02 DIAGNOSIS — R109 Unspecified abdominal pain: Secondary | ICD-10-CM | POA: Diagnosis not present

## 2020-11-02 DIAGNOSIS — Z20822 Contact with and (suspected) exposure to covid-19: Secondary | ICD-10-CM | POA: Diagnosis not present

## 2020-11-02 DIAGNOSIS — R578 Other shock: Secondary | ICD-10-CM | POA: Diagnosis not present

## 2020-11-02 DIAGNOSIS — A403 Sepsis due to Streptococcus pneumoniae: Secondary | ICD-10-CM | POA: Diagnosis not present

## 2020-11-02 DIAGNOSIS — I4891 Unspecified atrial fibrillation: Secondary | ICD-10-CM | POA: Diagnosis not present

## 2020-11-02 DIAGNOSIS — Z66 Do not resuscitate: Secondary | ICD-10-CM | POA: Diagnosis not present

## 2020-11-02 DIAGNOSIS — J9601 Acute respiratory failure with hypoxia: Secondary | ICD-10-CM | POA: Diagnosis not present

## 2020-11-02 DIAGNOSIS — Z833 Family history of diabetes mellitus: Secondary | ICD-10-CM | POA: Diagnosis not present

## 2020-11-02 DIAGNOSIS — R1310 Dysphagia, unspecified: Secondary | ICD-10-CM | POA: Diagnosis not present

## 2020-11-02 DIAGNOSIS — I10 Essential (primary) hypertension: Secondary | ICD-10-CM | POA: Diagnosis not present

## 2020-11-02 DIAGNOSIS — Z87891 Personal history of nicotine dependence: Secondary | ICD-10-CM | POA: Diagnosis not present

## 2020-11-02 DIAGNOSIS — Z515 Encounter for palliative care: Secondary | ICD-10-CM | POA: Diagnosis not present

## 2020-11-02 DIAGNOSIS — R4020 Unspecified coma: Secondary | ICD-10-CM | POA: Diagnosis not present

## 2020-11-02 DIAGNOSIS — R6889 Other general symptoms and signs: Secondary | ICD-10-CM | POA: Diagnosis not present

## 2020-11-02 DIAGNOSIS — Z4682 Encounter for fitting and adjustment of non-vascular catheter: Secondary | ICD-10-CM | POA: Diagnosis not present

## 2020-11-02 DIAGNOSIS — J9811 Atelectasis: Secondary | ICD-10-CM | POA: Diagnosis not present

## 2020-11-02 DIAGNOSIS — I2699 Other pulmonary embolism without acute cor pulmonale: Secondary | ICD-10-CM | POA: Diagnosis not present

## 2020-11-02 DIAGNOSIS — R188 Other ascites: Secondary | ICD-10-CM | POA: Diagnosis not present

## 2020-11-02 DIAGNOSIS — Z7982 Long term (current) use of aspirin: Secondary | ICD-10-CM | POA: Diagnosis not present

## 2020-11-02 DIAGNOSIS — G936 Cerebral edema: Secondary | ICD-10-CM | POA: Diagnosis not present

## 2020-11-02 DIAGNOSIS — J323 Chronic sphenoidal sinusitis: Secondary | ICD-10-CM | POA: Diagnosis not present

## 2020-11-02 DIAGNOSIS — L89150 Pressure ulcer of sacral region, unstageable: Secondary | ICD-10-CM | POA: Diagnosis not present

## 2020-11-02 DIAGNOSIS — I482 Chronic atrial fibrillation, unspecified: Secondary | ICD-10-CM | POA: Diagnosis not present

## 2020-11-02 DIAGNOSIS — N179 Acute kidney failure, unspecified: Secondary | ICD-10-CM | POA: Diagnosis not present

## 2020-11-02 DIAGNOSIS — R0689 Other abnormalities of breathing: Secondary | ICD-10-CM | POA: Diagnosis not present

## 2020-11-02 DIAGNOSIS — R404 Transient alteration of awareness: Secondary | ICD-10-CM | POA: Diagnosis not present

## 2020-11-02 DIAGNOSIS — A419 Sepsis, unspecified organism: Secondary | ICD-10-CM | POA: Diagnosis not present

## 2020-11-02 DIAGNOSIS — R1312 Dysphagia, oropharyngeal phase: Secondary | ICD-10-CM | POA: Diagnosis not present

## 2020-11-02 DIAGNOSIS — I462 Cardiac arrest due to underlying cardiac condition: Secondary | ICD-10-CM | POA: Diagnosis not present

## 2020-11-02 DIAGNOSIS — I517 Cardiomegaly: Secondary | ICD-10-CM | POA: Diagnosis not present

## 2020-11-03 DIAGNOSIS — L8990 Pressure ulcer of unspecified site, unspecified stage: Secondary | ICD-10-CM | POA: Diagnosis not present

## 2020-11-03 DIAGNOSIS — A419 Sepsis, unspecified organism: Secondary | ICD-10-CM | POA: Diagnosis not present

## 2020-11-03 DIAGNOSIS — I4891 Unspecified atrial fibrillation: Secondary | ICD-10-CM | POA: Diagnosis not present

## 2020-11-03 DIAGNOSIS — R7881 Bacteremia: Secondary | ICD-10-CM | POA: Diagnosis not present

## 2020-11-04 ENCOUNTER — Telehealth: Payer: Self-pay | Admitting: Internal Medicine

## 2020-11-04 DIAGNOSIS — R5381 Other malaise: Secondary | ICD-10-CM

## 2020-11-04 DIAGNOSIS — L89150 Pressure ulcer of sacral region, unstageable: Secondary | ICD-10-CM

## 2020-11-04 NOTE — Telephone Encounter (Signed)
RTC, daughter states she does not like the in-patient rehab her father has been admitted to from in-patient hospitalization d/c. She states "They have an attitude. He has a toenail that is hanging off now and someone was supposed to have come and cut it, but they didn't.  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  I don't want the place to know I am complaining.  I just don't want him there.  I want to bring him to my home and have outpatient therapy work with him".  RN informed daughter based on patient's level of need, outpatient therapy is sometimes not enough and he will need in-patient therapy.  Informed daughter RN will send message to MD, but informed her if he does need in-patient therapy, MD may need to involve social worker [facility]) to see if a transfer to another facility is possible. (Dr. Gilford Rile, If social worker is needed, please put a referral into [facility]). Thank you, SChaplin, RN,BSN

## 2020-11-04 NOTE — Telephone Encounter (Signed)
Pt's daughter is requesting a call back in reference to her father's care.  This patient is currently @ the following:  [facility] [place],  [place], [place] [address]  [phone]

## 2020-11-09 ENCOUNTER — Telehealth: Payer: Self-pay | Admitting: *Deleted

## 2020-11-09 NOTE — Chronic Care Management (AMB) (Signed)
  Care Management   Note  11/09/2020 Name: Daniel [name] MRN: [id number] DOB: 1940/08/31  Daniel [name] is a 80 y.o. year old male who is a primary care patient of Maudie Mercury, MD. I reached out to Pilar Plate by phone today in response to a referral sent by Daniel [name]'s Dr. Gilford Rile..    Daniel [name] was given information about care management services today including:  Care management services include personalized support from designated clinical staff supervised by his physician, including individualized plan of care and coordination with other care providers 24/7 contact phone numbers for assistance for urgent and routine care needs. The patient may stop care management services at any time by phone call to the office staff.  Daughter Brenyn Petrey verbally agreed to assistance and services provided by embedded care coordination/care management team today.  Follow up plan: Telephone appointment with care management team member scheduled for:11/11/2020  [facility]

## 2020-11-13 ENCOUNTER — Emergency Department (HOSPITAL_COMMUNITY): Payer: Medicare Other

## 2020-11-13 ENCOUNTER — Inpatient Hospital Stay (HOSPITAL_COMMUNITY): Payer: Medicare Other

## 2020-11-13 ENCOUNTER — Other Ambulatory Visit: Payer: Self-pay

## 2020-11-13 ENCOUNTER — Inpatient Hospital Stay (HOSPITAL_COMMUNITY)
Admission: EM | Admit: 2020-11-13 | Discharge: 2020-12-13 | DRG: 308 | Disposition: E | Payer: Medicare Other | Source: Skilled Nursing Facility | Attending: Internal Medicine | Admitting: Internal Medicine

## 2020-11-13 DIAGNOSIS — Z79899 Other long term (current) drug therapy: Secondary | ICD-10-CM

## 2020-11-13 DIAGNOSIS — N179 Acute kidney failure, unspecified: Secondary | ICD-10-CM | POA: Diagnosis not present

## 2020-11-13 DIAGNOSIS — R404 Transient alteration of awareness: Secondary | ICD-10-CM | POA: Diagnosis not present

## 2020-11-13 DIAGNOSIS — I462 Cardiac arrest due to underlying cardiac condition: Secondary | ICD-10-CM | POA: Diagnosis present

## 2020-11-13 DIAGNOSIS — R6889 Other general symptoms and signs: Secondary | ICD-10-CM | POA: Diagnosis not present

## 2020-11-13 DIAGNOSIS — I482 Chronic atrial fibrillation, unspecified: Secondary | ICD-10-CM | POA: Diagnosis present

## 2020-11-13 DIAGNOSIS — R188 Other ascites: Secondary | ICD-10-CM | POA: Diagnosis not present

## 2020-11-13 DIAGNOSIS — Z7982 Long term (current) use of aspirin: Secondary | ICD-10-CM

## 2020-11-13 DIAGNOSIS — Z66 Do not resuscitate: Secondary | ICD-10-CM | POA: Diagnosis not present

## 2020-11-13 DIAGNOSIS — G936 Cerebral edema: Secondary | ICD-10-CM | POA: Diagnosis present

## 2020-11-13 DIAGNOSIS — J9601 Acute respiratory failure with hypoxia: Secondary | ICD-10-CM | POA: Diagnosis not present

## 2020-11-13 DIAGNOSIS — J9602 Acute respiratory failure with hypercapnia: Secondary | ICD-10-CM | POA: Diagnosis present

## 2020-11-13 DIAGNOSIS — I4891 Unspecified atrial fibrillation: Secondary | ICD-10-CM

## 2020-11-13 DIAGNOSIS — R0689 Other abnormalities of breathing: Secondary | ICD-10-CM | POA: Diagnosis not present

## 2020-11-13 DIAGNOSIS — J9811 Atelectasis: Secondary | ICD-10-CM | POA: Diagnosis not present

## 2020-11-13 DIAGNOSIS — I10 Essential (primary) hypertension: Secondary | ICD-10-CM | POA: Diagnosis present

## 2020-11-13 DIAGNOSIS — R109 Unspecified abdominal pain: Secondary | ICD-10-CM | POA: Diagnosis not present

## 2020-11-13 DIAGNOSIS — G931 Anoxic brain damage, not elsewhere classified: Secondary | ICD-10-CM | POA: Diagnosis present

## 2020-11-13 DIAGNOSIS — Z20822 Contact with and (suspected) exposure to covid-19: Secondary | ICD-10-CM | POA: Diagnosis not present

## 2020-11-13 DIAGNOSIS — R911 Solitary pulmonary nodule: Secondary | ICD-10-CM | POA: Diagnosis not present

## 2020-11-13 DIAGNOSIS — J323 Chronic sphenoidal sinusitis: Secondary | ICD-10-CM | POA: Diagnosis not present

## 2020-11-13 DIAGNOSIS — Z833 Family history of diabetes mellitus: Secondary | ICD-10-CM | POA: Diagnosis not present

## 2020-11-13 DIAGNOSIS — Z930 Tracheostomy status: Secondary | ICD-10-CM

## 2020-11-13 DIAGNOSIS — R4020 Unspecified coma: Secondary | ICD-10-CM | POA: Diagnosis not present

## 2020-11-13 DIAGNOSIS — I469 Cardiac arrest, cause unspecified: Secondary | ICD-10-CM

## 2020-11-13 DIAGNOSIS — I2699 Other pulmonary embolism without acute cor pulmonale: Secondary | ICD-10-CM | POA: Diagnosis not present

## 2020-11-13 DIAGNOSIS — Z87891 Personal history of nicotine dependence: Secondary | ICD-10-CM | POA: Diagnosis not present

## 2020-11-13 DIAGNOSIS — Z4682 Encounter for fitting and adjustment of non-vascular catheter: Secondary | ICD-10-CM | POA: Diagnosis not present

## 2020-11-13 DIAGNOSIS — I517 Cardiomegaly: Secondary | ICD-10-CM | POA: Diagnosis not present

## 2020-11-13 DIAGNOSIS — I4901 Ventricular fibrillation: Principal | ICD-10-CM | POA: Diagnosis present

## 2020-11-13 DIAGNOSIS — R578 Other shock: Secondary | ICD-10-CM | POA: Diagnosis not present

## 2020-11-13 DIAGNOSIS — Z515 Encounter for palliative care: Secondary | ICD-10-CM

## 2020-11-13 DIAGNOSIS — Z7901 Long term (current) use of anticoagulants: Secondary | ICD-10-CM

## 2020-11-13 DIAGNOSIS — Z743 Need for continuous supervision: Secondary | ICD-10-CM | POA: Diagnosis not present

## 2020-11-13 DIAGNOSIS — J90 Pleural effusion, not elsewhere classified: Secondary | ICD-10-CM | POA: Diagnosis not present

## 2020-11-13 DIAGNOSIS — I499 Cardiac arrhythmia, unspecified: Secondary | ICD-10-CM | POA: Diagnosis not present

## 2020-11-13 LAB — CBC
HCT: 32.2 % — ABNORMAL LOW (ref 39.0–52.0)
HCT: 37.2 % — ABNORMAL LOW (ref 39.0–52.0)
Hemoglobin: 8.7 g/dL — ABNORMAL LOW (ref 13.0–17.0)
Hemoglobin: 10.5 g/dL — ABNORMAL LOW (ref 13.0–17.0)
MCH: 26.4 pg (ref 26.0–34.0)
MCH: 25.7 pg — ABNORMAL LOW (ref 26.0–34.0)
MCHC: 27 g/dL — ABNORMAL LOW (ref 30.0–36.0)
MCHC: 28.2 g/dL — ABNORMAL LOW (ref 30.0–36.0)
MCV: 97.6 fL (ref 80.0–100.0)
MCV: 91.2 fL (ref 80.0–100.0)
Platelets: 434 10*3/uL — ABNORMAL HIGH (ref 150–400)
Platelets: 406 10*3/uL — ABNORMAL HIGH (ref 150–400)
RBC: 3.3 MIL/uL — ABNORMAL LOW (ref 4.22–5.81)
RBC: 4.08 MIL/uL — ABNORMAL LOW (ref 4.22–5.81)
RDW: 17.9 % — ABNORMAL HIGH (ref 11.5–15.5)
RDW: 17.7 % — ABNORMAL HIGH (ref 11.5–15.5)
WBC: 17 10*3/uL — ABNORMAL HIGH (ref 4.0–10.5)
WBC: 19 10*3/uL — ABNORMAL HIGH (ref 4.0–10.5)
nRBC: 0.4 % — ABNORMAL HIGH (ref 0.0–0.2)
nRBC: 0.3 % — ABNORMAL HIGH (ref 0.0–0.2)

## 2020-11-13 LAB — BASIC METABOLIC PANEL
Anion gap: 9 (ref 5–15)
BUN: 12 mg/dL (ref 8–23)
CO2: 30 mmol/L (ref 22–32)
Calcium: 8.1 mg/dL — ABNORMAL LOW (ref 8.9–10.3)
Chloride: 105 mmol/L (ref 98–111)
Creatinine, Ser: 1.09 mg/dL (ref 0.61–1.24)
GFR, Estimated: >60 mL/min (ref >60)
Glucose, Bld: 124 mg/dL — ABNORMAL HIGH (ref 70–99)
Potassium: 4.3 mmol/L (ref 3.5–5.1)
Sodium: 144 mmol/L (ref 135–145)

## 2020-11-13 LAB — I-STAT ARTERIAL BLOOD GAS, ED
Acid-base deficit: 1 mmol/L (ref 0.0–2.0)
Bicarbonate: 28.2 mmol/L — ABNORMAL HIGH (ref 20.0–28.0)
Calcium, Ion: 1.14 mmol/L — ABNORMAL LOW (ref 1.15–1.40)
HCT: 29 % — ABNORMAL LOW (ref 39.0–52.0)
Hemoglobin: 9.9 g/dL — ABNORMAL LOW (ref 13.0–17.0)
O2 Saturation: 99 %
Potassium: 3.9 mmol/L (ref 3.5–5.1)
Sodium: 145 mmol/L (ref 135–145)
TCO2: 30 mmol/L (ref 22–32)
pCO2 arterial: 70.9 mmHg (ref 32.0–48.0)
pH, Arterial: 7.207 — ABNORMAL LOW (ref 7.350–7.450)
pO2, Arterial: 183 mmHg — ABNORMAL HIGH (ref 83.0–108.0)

## 2020-11-13 LAB — I-STAT CHEM 8, ED
BUN: 15 mg/dL (ref 8–23)
Calcium, Ion: 1.07 mmol/L — ABNORMAL LOW (ref 1.15–1.40)
Chloride: 104 mmol/L (ref 98–111)
Creatinine, Ser: 0.9 mg/dL (ref 0.61–1.24)
Glucose, Bld: 120 mg/dL — ABNORMAL HIGH (ref 70–99)
HCT: 30 % — ABNORMAL LOW (ref 39.0–52.0)
Hemoglobin: 10.2 g/dL — ABNORMAL LOW (ref 13.0–17.0)
Potassium: 4.3 mmol/L (ref 3.5–5.1)
Sodium: 145 mmol/L (ref 135–145)
TCO2: 29 mmol/L (ref 22–32)

## 2020-11-13 LAB — RESP PANEL BY RT-PCR (FLU A&B, COVID) ARPGX2
Influenza A by PCR: NEGATIVE
Influenza B by PCR: NEGATIVE
SARS Coronavirus 2 by RT PCR: NEGATIVE

## 2020-11-13 LAB — CBG MONITORING, ED: Glucose-Capillary: 108 mg/dL — ABNORMAL HIGH (ref 70–99)

## 2020-11-13 LAB — PROTIME-INR
INR: 1.8 — ABNORMAL HIGH (ref 0.8–1.2)
Prothrombin Time: 20.9 seconds — ABNORMAL HIGH (ref 11.4–15.2)

## 2020-11-13 LAB — LACTIC ACID, PLASMA
Lactic Acid, Venous: 8.3 mmol/L (ref 0.5–1.9)
Lactic Acid, Venous: 5.7 mmol/L (ref 0.5–1.9)

## 2020-11-13 LAB — TROPONIN I (HIGH SENSITIVITY)
Troponin I (High Sensitivity): 49 ng/L — ABNORMAL HIGH (ref <18)
Troponin I (High Sensitivity): 66 ng/L — ABNORMAL HIGH (ref <18)

## 2020-11-13 IMAGING — CT CT ABD-PELV W/ CM
2 of 5 series · 16 of 46 positions shown, 18 images · IV contrast (omnipaque)
Comparison: [DATE]

CLINICAL DATA: Abdominal pain.

EXAM:
CT ABDOMEN AND PELVIS WITH CONTRAST
TECHNIQUE: Multidetector CT imaging of the abdomen and pelvis was performed
using the standard protocol following bolus administration of
intravenous contrast.
CONTRAST:  100mL OMNIPAQUE IOHEXOL 350 MG/ML SOLN

[Series 5: a/p w/ 5mm · axial · 0.95mm/px · z∈[-1141,-681]mm · 13 of 104 slices shown, 15 images]
[im 6/104  soft-tissue]
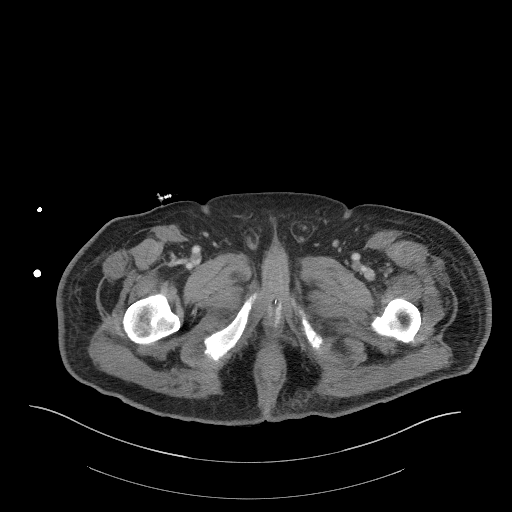
[im 6/104  bone]
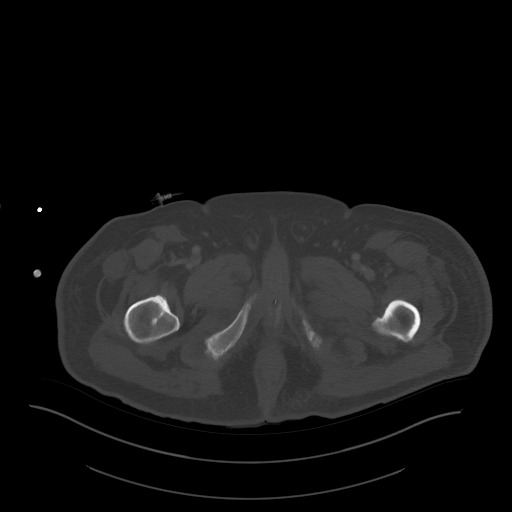
[im 16/104  soft-tissue]
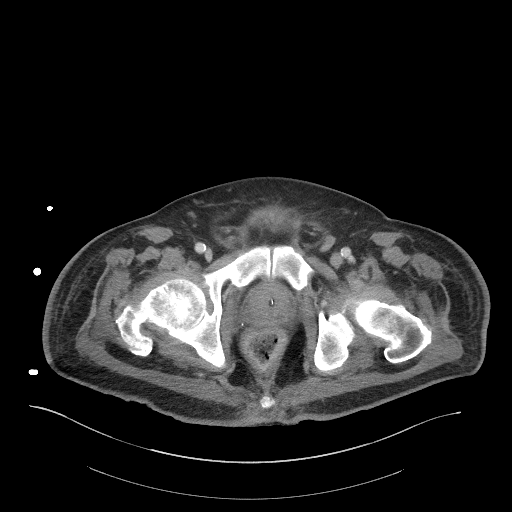
[im 21/104  soft-tissue]
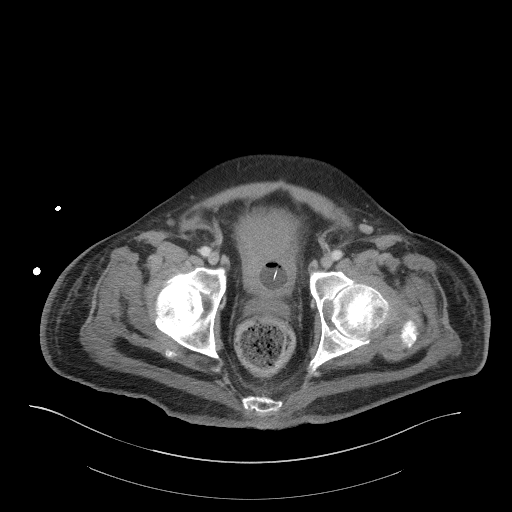
[im 31/104  soft-tissue]
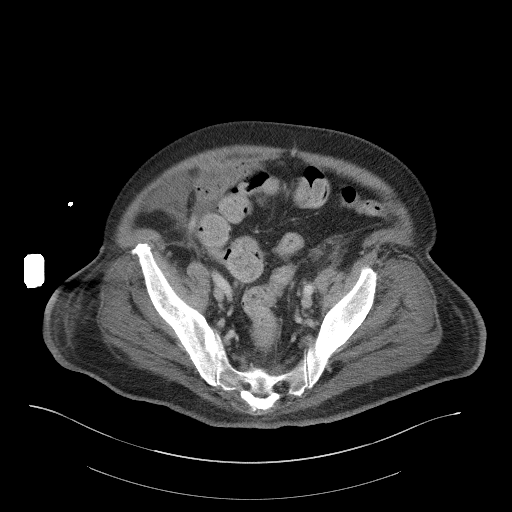
[im 37/104  soft-tissue]
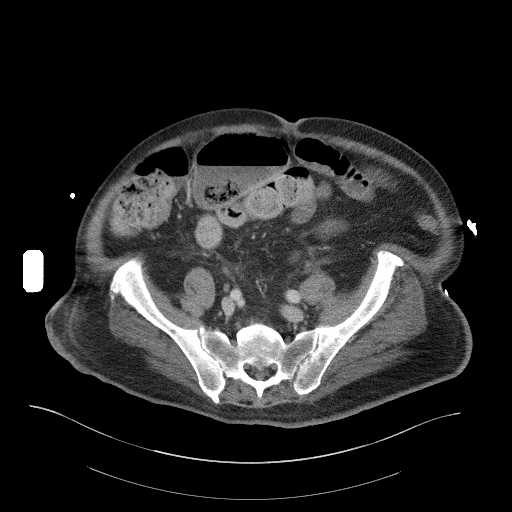
[im 47/104  soft-tissue]
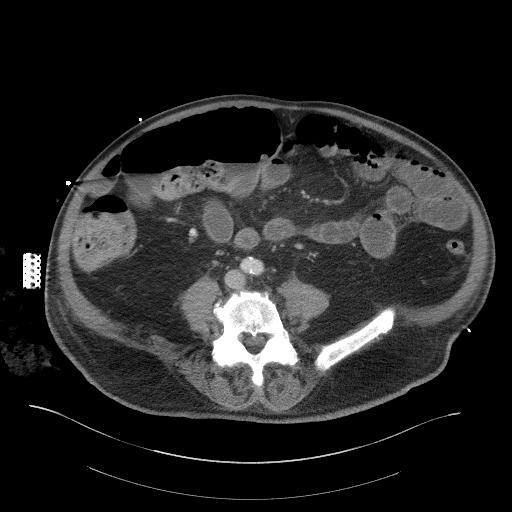
[im 52/104  soft-tissue]
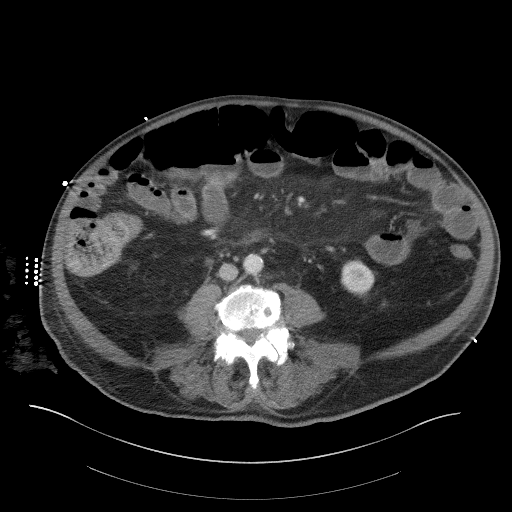
[im 57/104  soft-tissue]
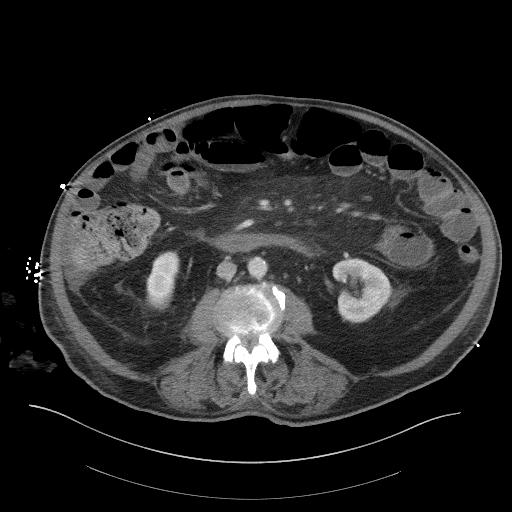
[im 67/104  soft-tissue]
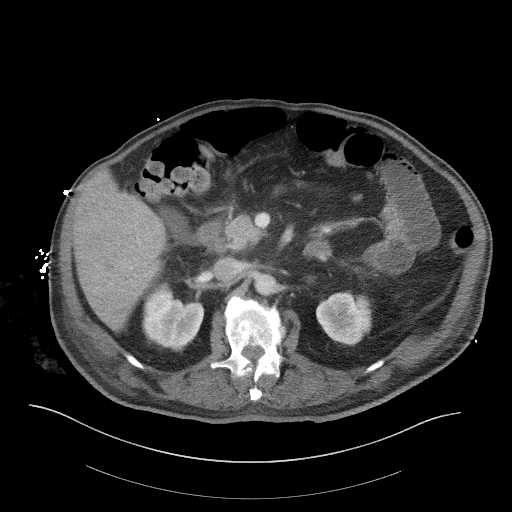
[im 67/104  bone]
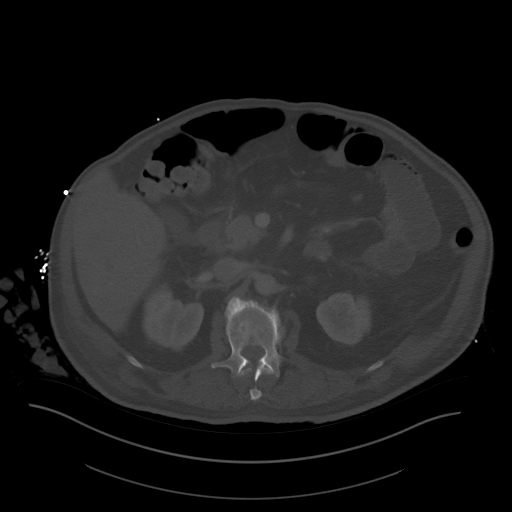
[im 73/104  soft-tissue]
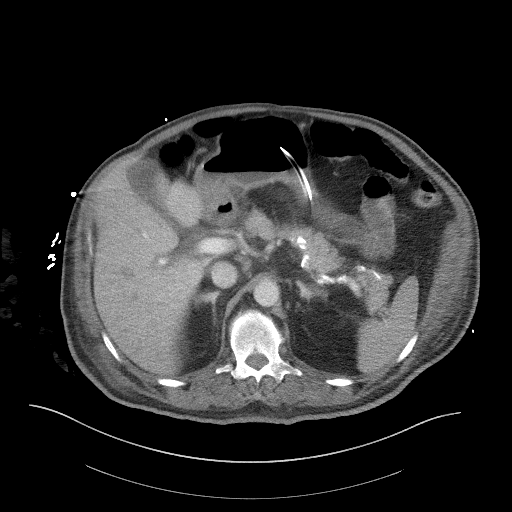
[im 83/104  soft-tissue]
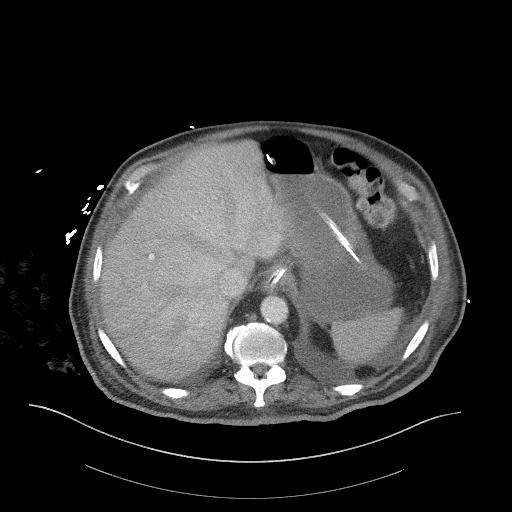
[im 88/104  soft-tissue]
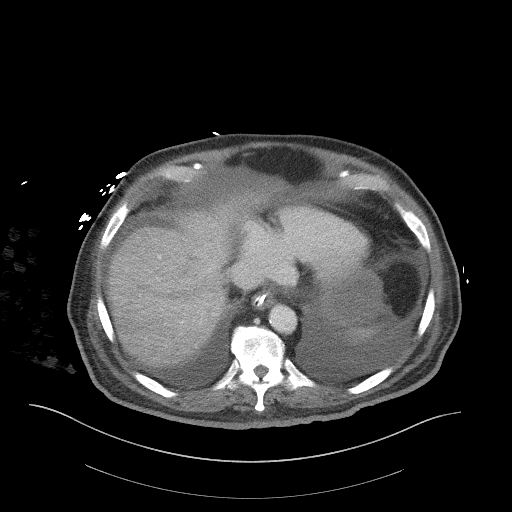
[im 98/104  soft-tissue]
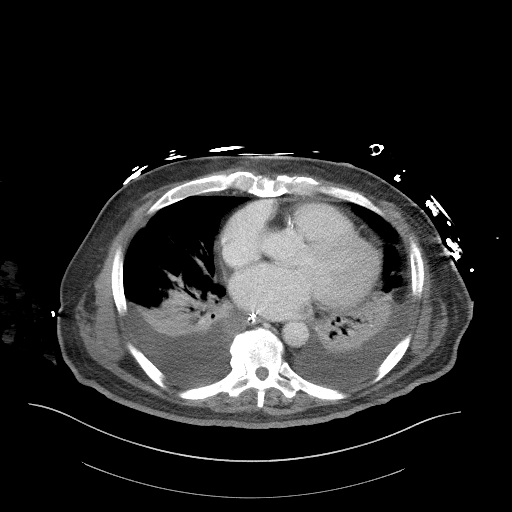

[Series 9: a/p w/ cor · coronal · 0.99mm/px · 3 of 165 slices shown]
[im 55/165  soft-tissue]
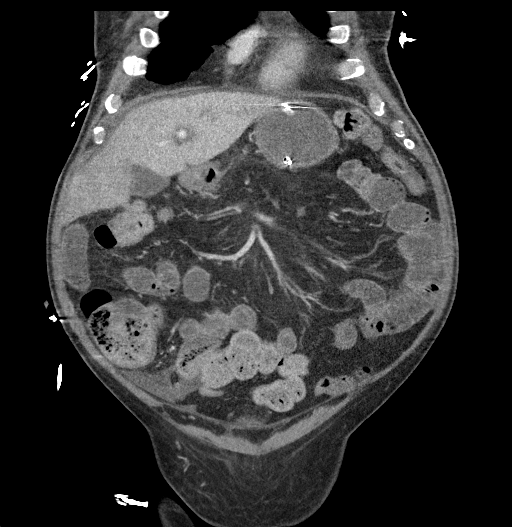
[im 73/165  soft-tissue]
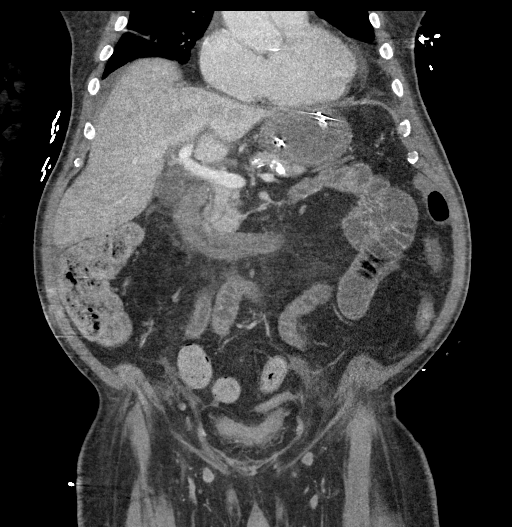
[im 92/165  soft-tissue]
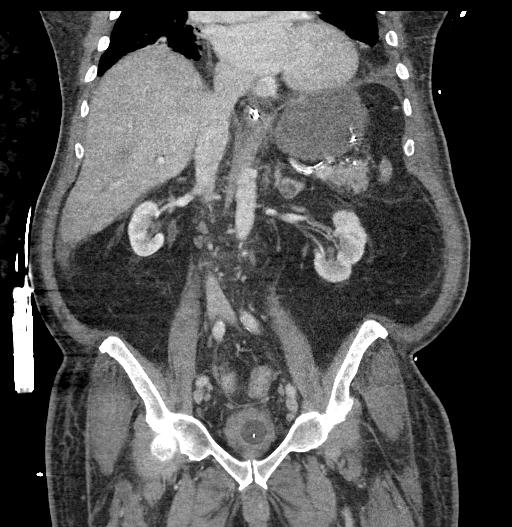

[16 of 46 positions shown; findings below may reference images not displayed]

FINDINGS: Lower chest: Bilateral pleural effusion with overlying atelectasis.

Hepatobiliary: Mild diffuse periportal edema. No focal liver
abnormality mild edema involving the gallbladder is noted. No
gallstones identified. No signs of bile duct dilatation.

Pancreas: Unremarkable. No pancreatic ductal dilatation or
surrounding inflammatory changes.

Spleen: Normal in size without focal abnormality.

Adrenals/Urinary Tract: Normal right adrenal gland. Left adrenal
nodule measures 1.6 cm and appears unchanged from previous exam. No
nephrolithiasis, mass or hydronephrosis identified bilaterally. No
hydroureter. Bladder is collapsed around a Foley catheter.

Stomach/Bowel: NG tube is identified within the stomach. There is
increase caliber of the proximal small bowel loops with scattered
air-fluid levels. These measure up to 3.6 cm. The distal small bowel
loops are decreased in caliber. Transition to decreased caliber
small bowel loops noted in the left lower quadrant of the abdomen.
There is a moderate stool burden identified involving the right
colon, sigmoid colon and rectum. No significant bowel wall
thickening or inflammation.

Vascular/Lymphatic: Aortic atherosclerosis. No aneurysm. The upper
abdominal vasculature appears patent. No abdominopelvic adenopathy
identified.

Reproductive: Status post hysterectomy. No adnexal masses.

Other: There is a small volume ascites within the abdomen and pelvis
which is new previous exam. Soft tissue stranding within the small
bowel mesentery is also new likely reflecting edema and or venous
congestion. No free intraperitoneal air identified. No focal fluid
collections.

Musculoskeletal: Degenerative disc disease noted within the lumbar
spine. No acute or suspicious osseous findings.
IMPRESSION: 1. Increase caliber of the proximal small bowel loops with scattered
air-fluid levels. Transition to decreased caliber small bowel loops
noted in the left lower quadrant of the abdomen. Findings are
concerning for small bowel obstruction.
2. New small volume of ascites within the abdomen and pelvis.
3. Bilateral pleural effusions with overlying
atelectasis/consolidation have increased in the interval.
4. Aortic atherosclerosis.

Aortic Atherosclerosis ([MS]-[MS]).

## 2020-11-13 IMAGING — DX DG CHEST 1V PORT
1 series · 1 of 1 positions shown · non-contrast
Comparison: [DATE]

CLINICAL DATA: Cardiac arrest.  Status post CPR and intubation

EXAM:
PORTABLE CHEST 1 VIEW

[chest]
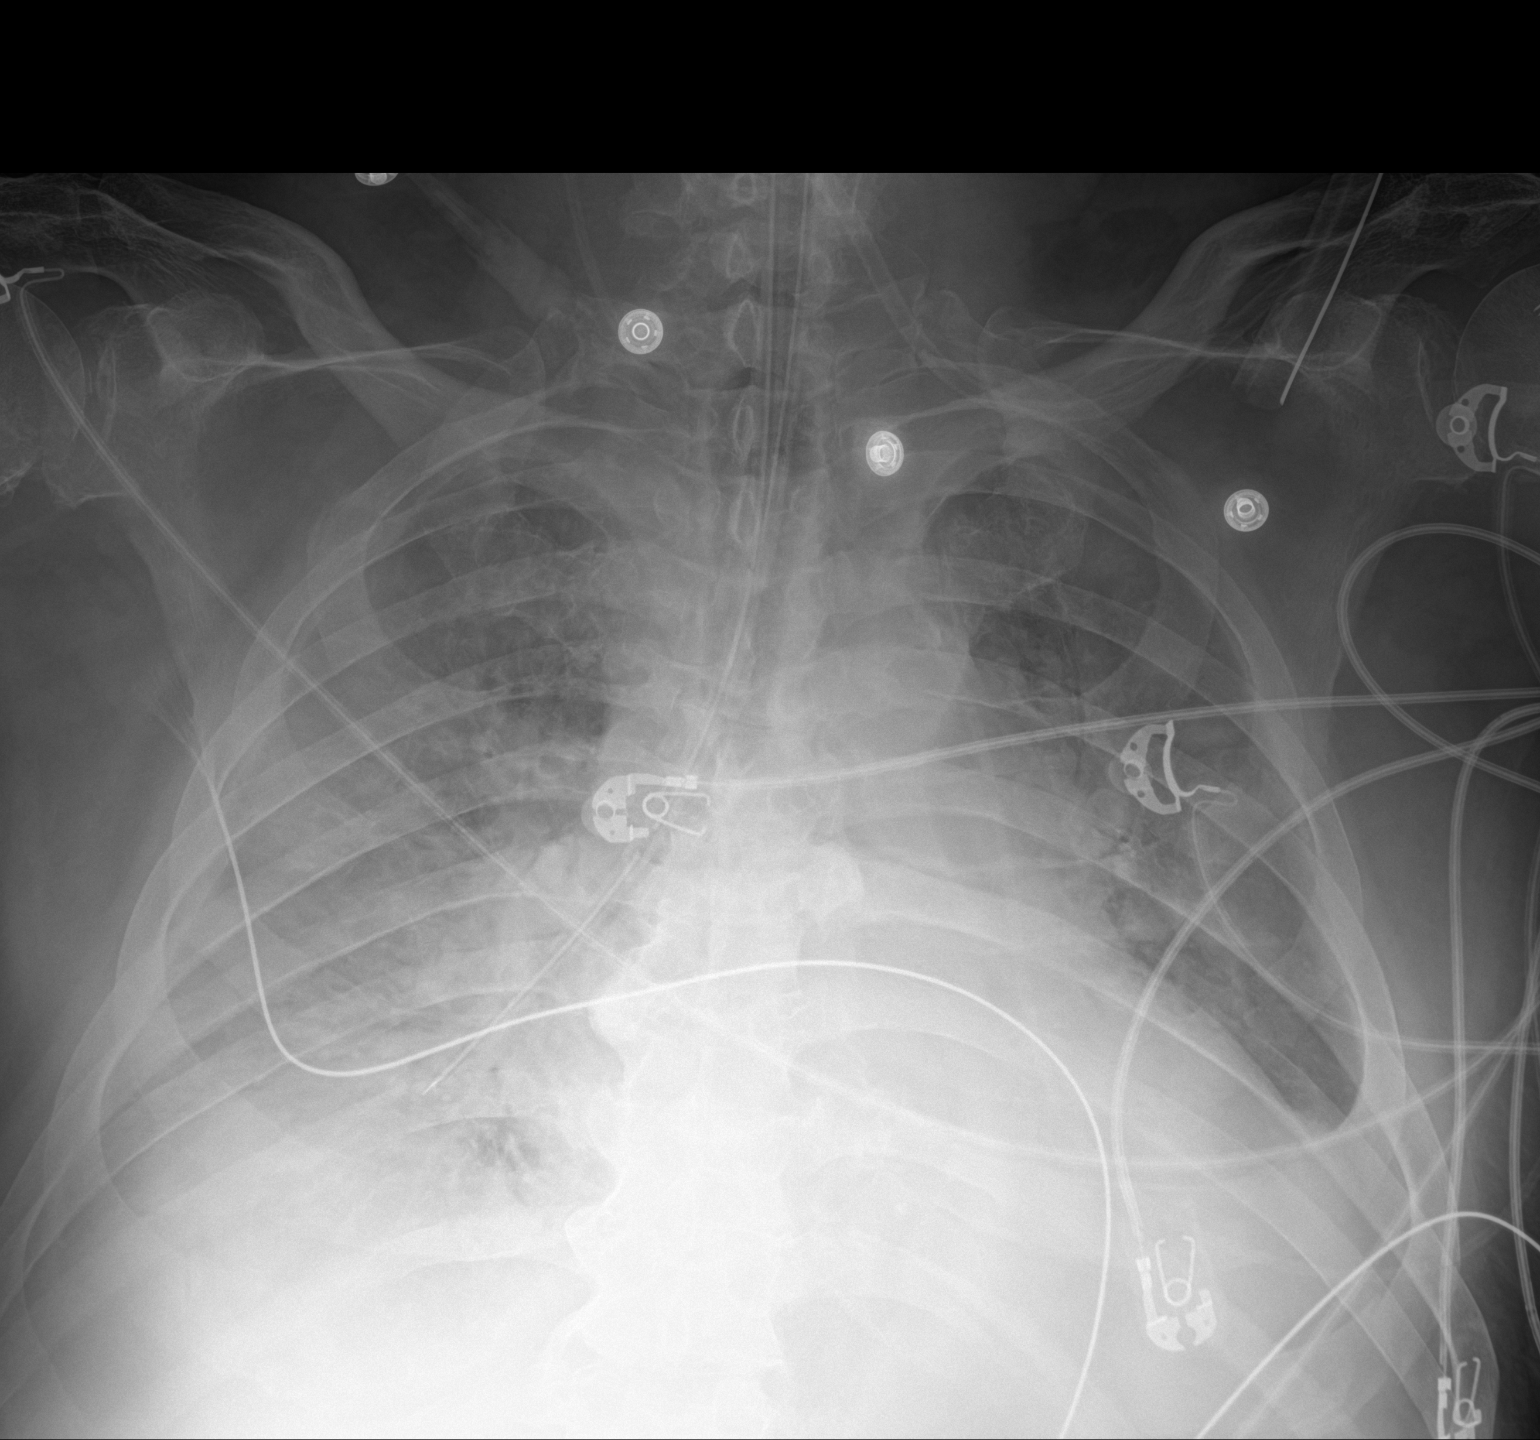

[1 of 1 positions shown; findings below may reference images not displayed]

FINDINGS: Endotracheal tube is seen in place with tip approximately 3 cm above
the carina. A nasogastric tube is in abnormal position with the tip
in the right lower lobe bronchus.

Cardiomegaly is again noted. Low lung volumes and diffuse bilateral
airspace disease are new since previous study. Small bilateral
pleural effusions cannot be excluded.
IMPRESSION: Abnormal nasogastric tube position with tip in right lower lobe
bronchus.

Endotracheal tube in appropriate position.

New low lung volumes and diffuse bilateral airspace disease.

Critical Value/emergent results were called by telephone at the time
of interpretation on [DATE] at [DATE] to provider GEER
, who verbally acknowledged these results.

## 2020-11-13 IMAGING — DX DG ABD PORTABLE 1V
1 series · 1 of 1 positions shown · non-contrast
Comparison: CT Abdomen and Pelvis [DATE].

CLINICAL DATA: 80-year-old male enteric tube placement.

EXAM:
PORTABLE ABDOMEN - 1 VIEW

[abdomen]
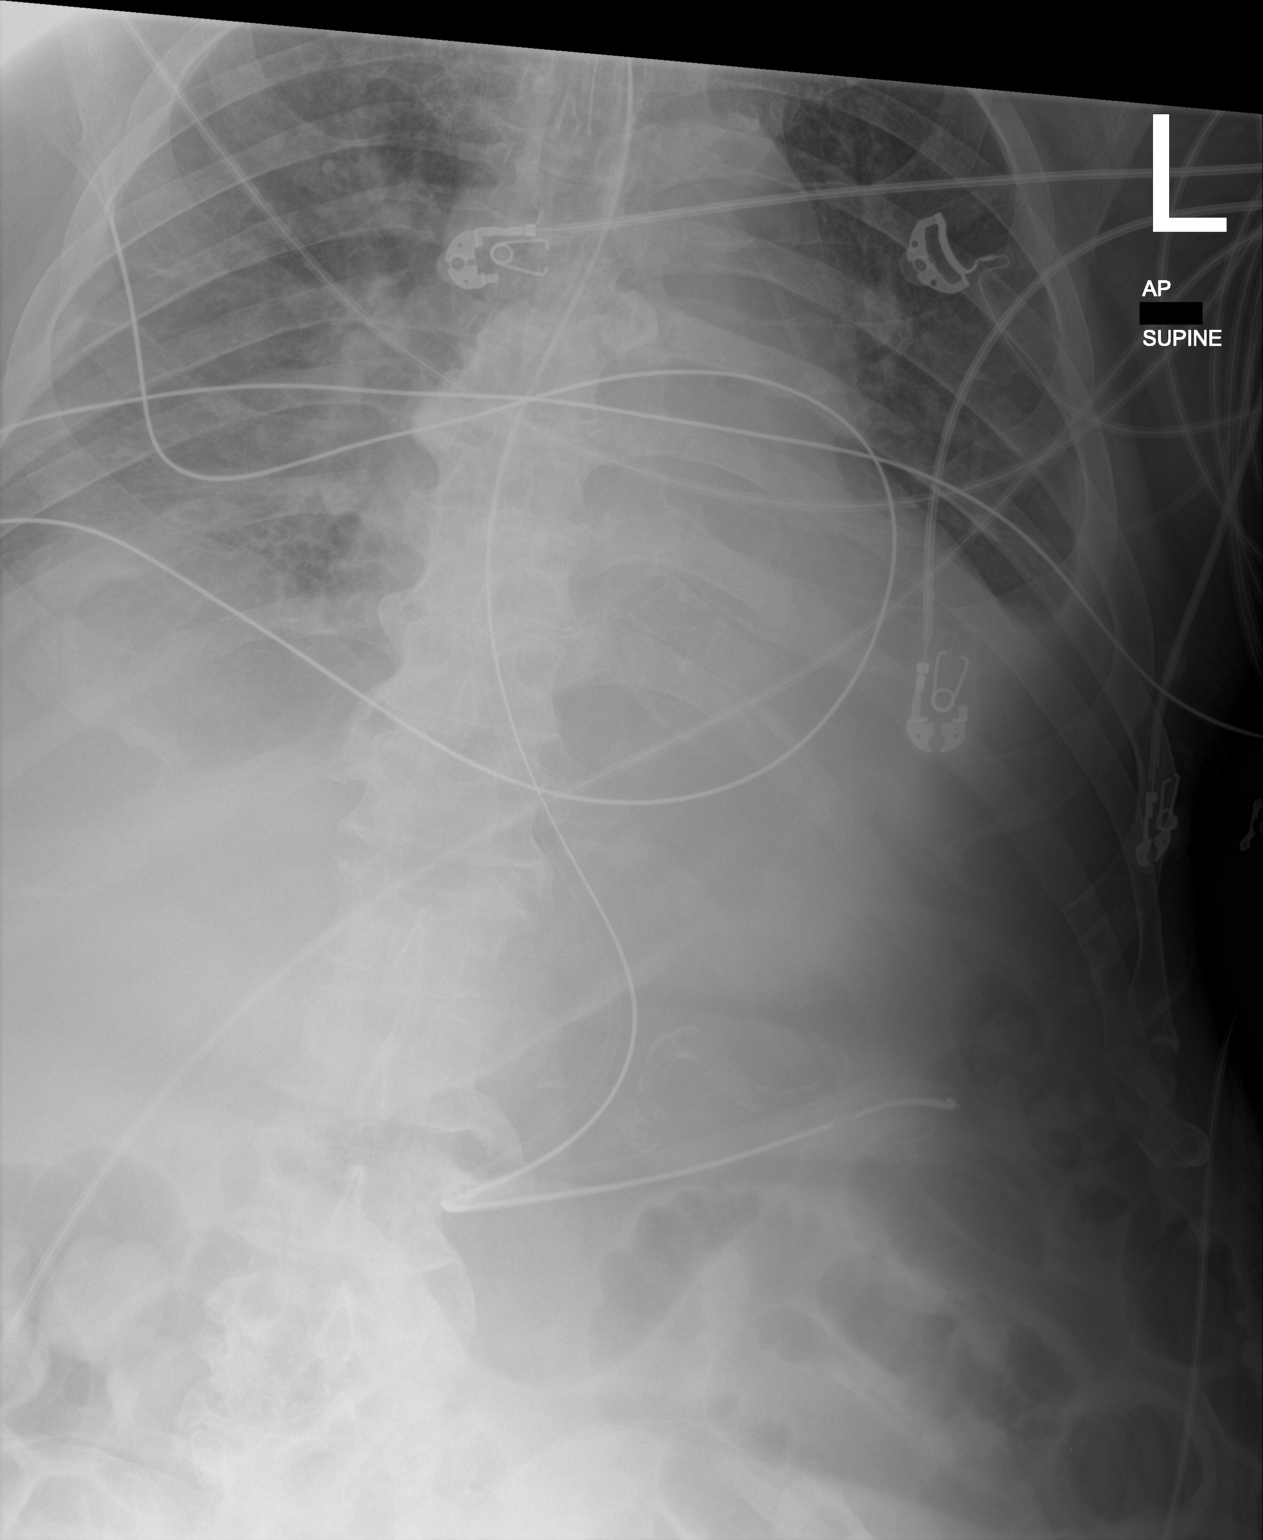

[1 of 1 positions shown; findings below may reference images not displayed]

FINDINGS: Portable AP supine view at [1C] hours. Enteric tube placed into the
stomach, loops in the gastric body. Visualized bowel gas pattern is
non obstructed. Continued blunting of the left lung base, small left
pleural effusion demonstrated [REDACTED]. No acute osseous abnormality
identified.
IMPRESSION: 1. Satisfactory enteric tube placement into the stomach.
2. Chronic small left pleural effusion.

## 2020-11-13 IMAGING — CT CT HEAD W/O CM
4 series · 15 of 47 positions shown, 17 images · non-contrast
Comparison: [DATE].

CLINICAL DATA: Found unresponsive.

EXAM:
CT HEAD WITHOUT CONTRAST
TECHNIQUE: Contiguous axial images were obtained from the base of the skull
through the vertex without intravenous contrast.

[Series 3: head without · axial · non-contrast · 0.48mm/px · z∈[-28,+92]mm · 7 of 33 slices shown, 9 images]
[im 5/33  brain]
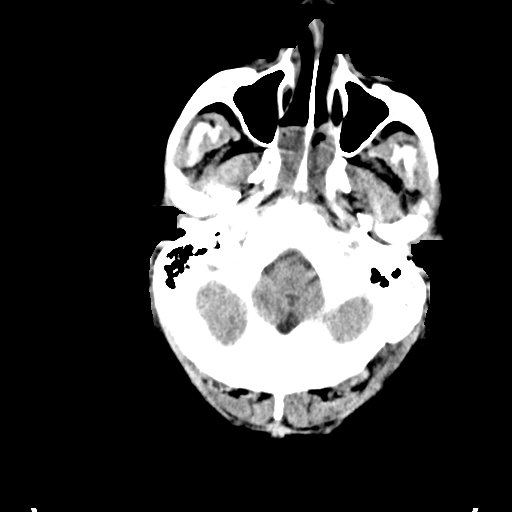
[im 5/33  bone]
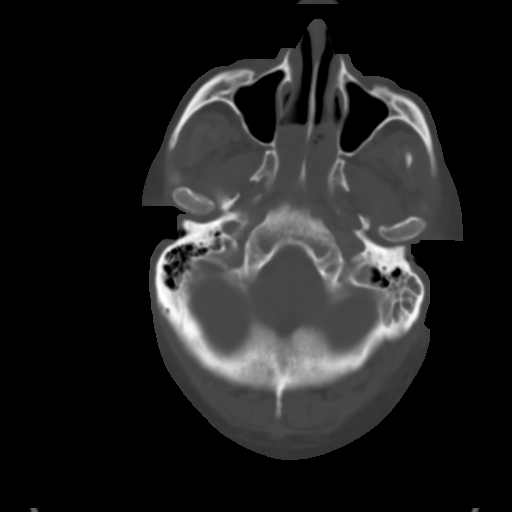
[im 9/33  brain]
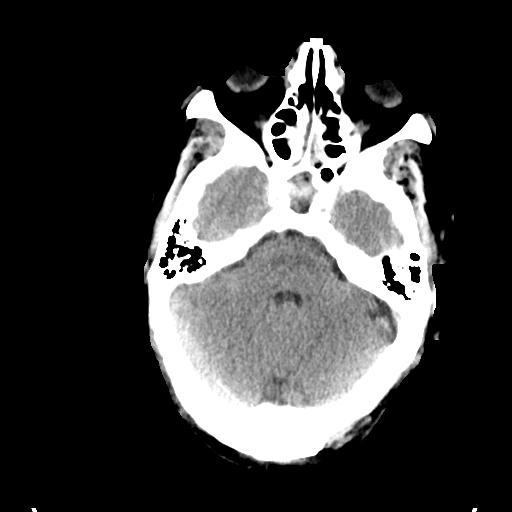
[im 13/33  brain]
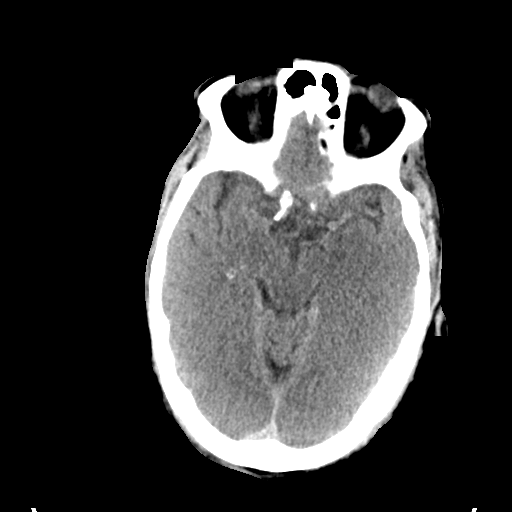
[im 17/33  brain]
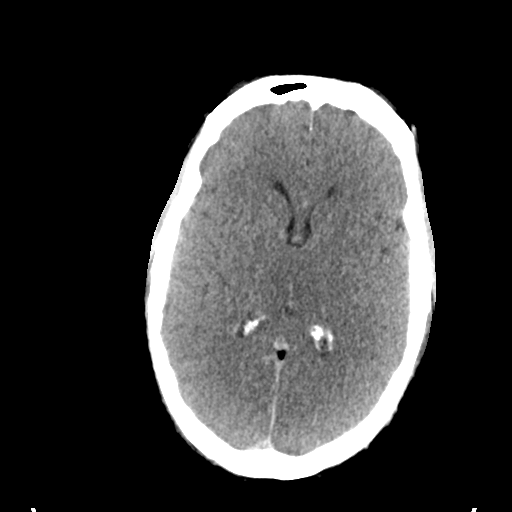
[im 21/33  brain]
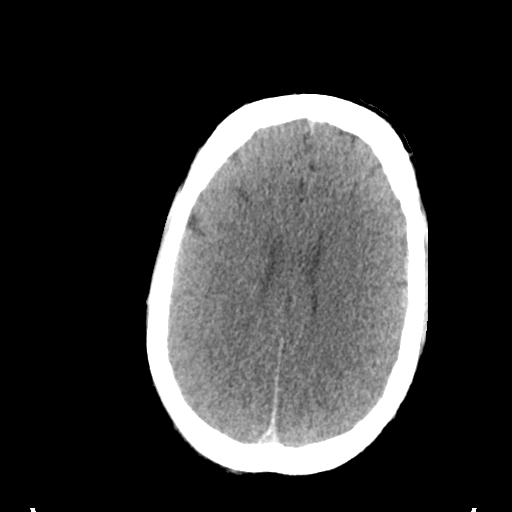
[im 21/33  bone]
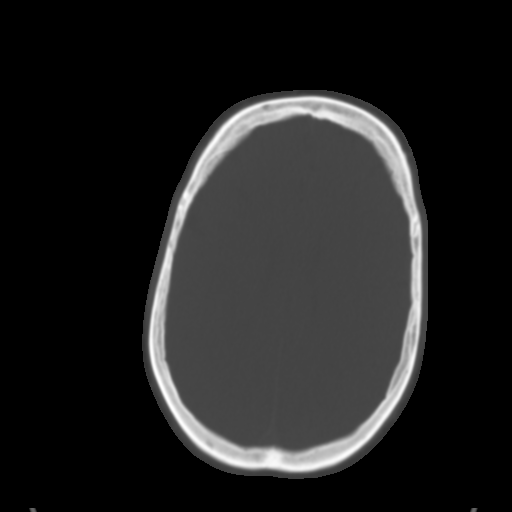
[im 25/33  brain]
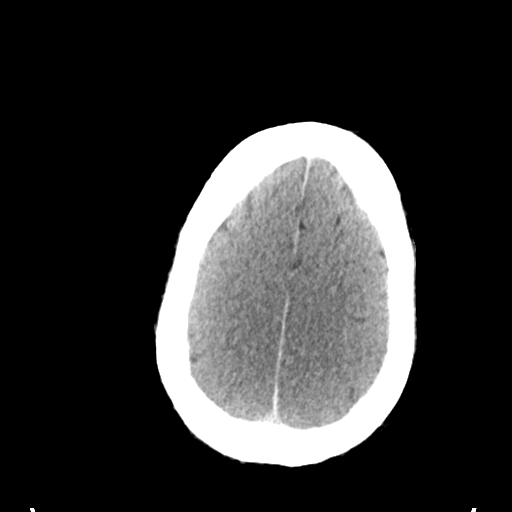
[im 29/33  brain]
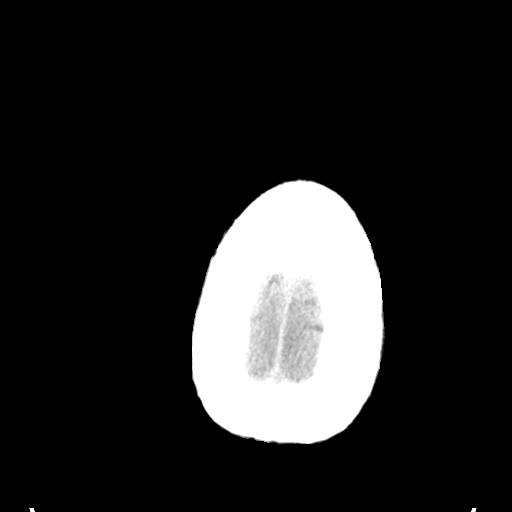

[Series 4: head bone · axial · 0.48mm/px · z∈[-32,-16]mm · 2 of 82 slices shown]
[im 9/82  bone]
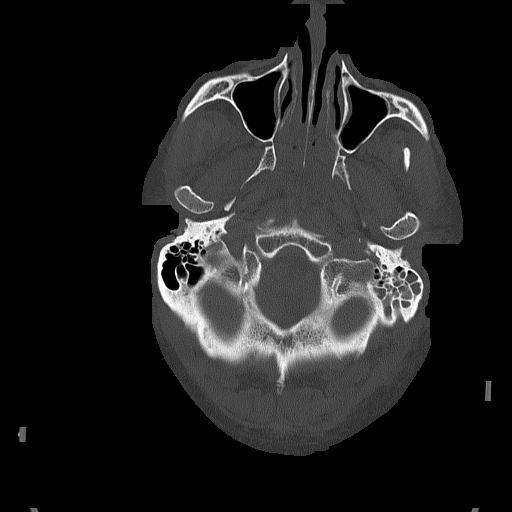
[im 17/82  bone]
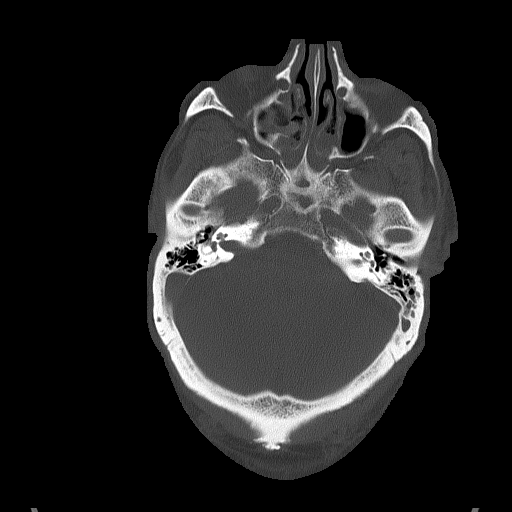

[Series 5: head without cor · coronal · non-contrast · 0.32mm/px · 3 of 66 slices shown]
[im 22/66  brain]
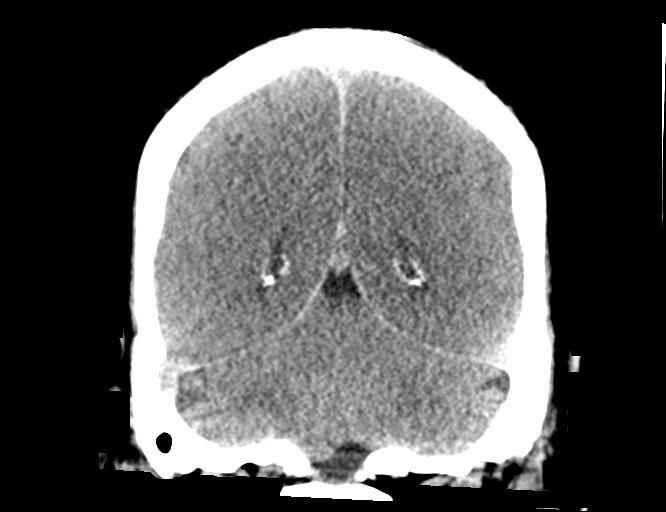
[im 29/66  brain]
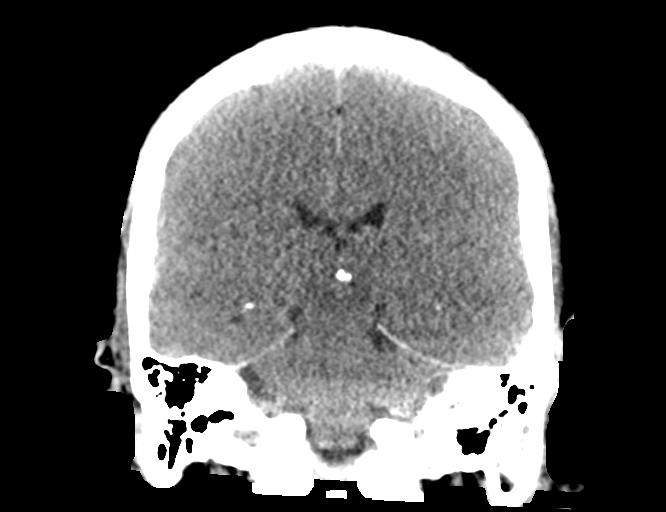
[im 37/66  brain]
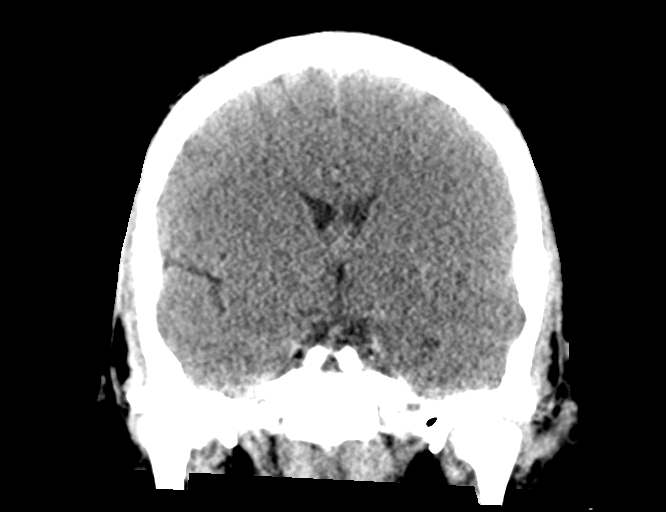

[Series 6: head without sag · sagittal · non-contrast · 0.32mm/px · 3 of 52 slices shown]
[im 18/52  brain]
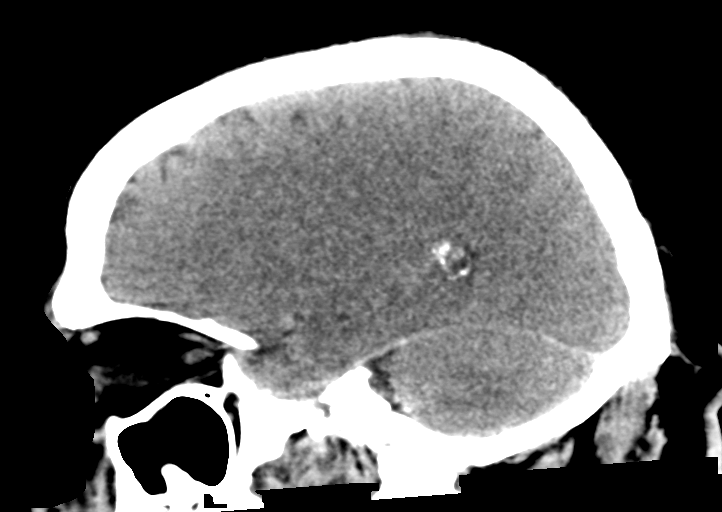
[im 26/52  brain]
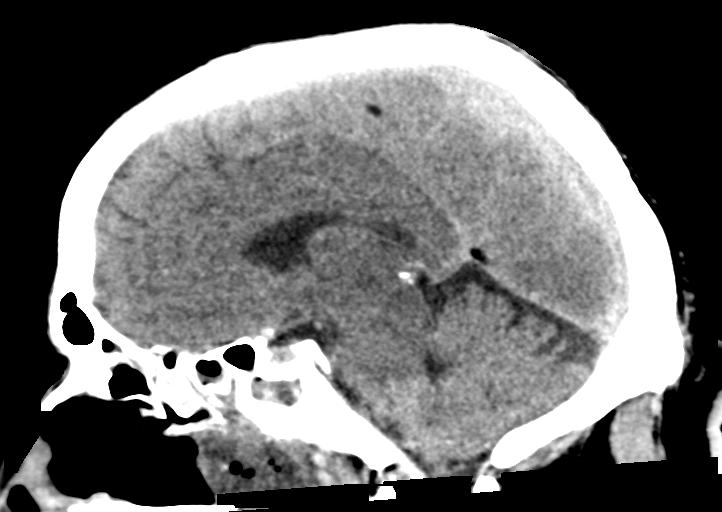
[im 35/52  brain]
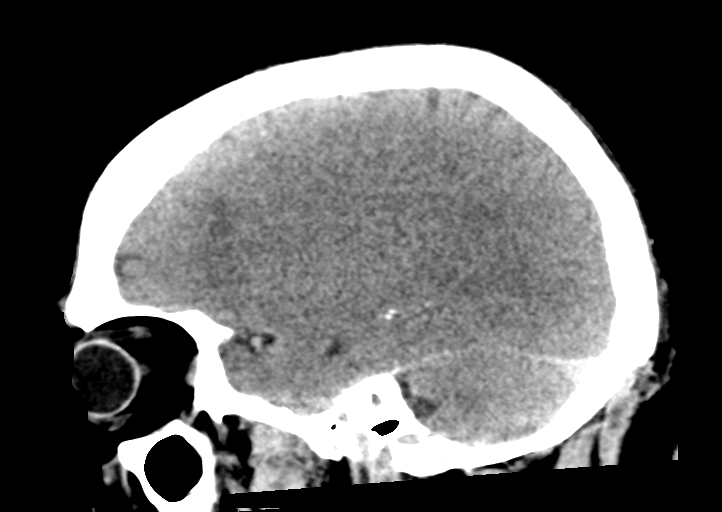

[15 of 47 positions shown; findings below may reference images not displayed]

FINDINGS: Brain: Diffuse sulcal effacement is noted with loss of gray-white
matter differentiation concerning for cerebral edema. Ventricular
size is within normal limits. No hemorrhage is noted.

Vascular: No hyperdense vessel or unexpected calcification.

Skull: Normal. Negative for fracture or focal lesion.

Sinuses/Orbits: Mild bilateral ethmoid and sphenoid sinusitis is
noted.

Other: None.
IMPRESSION: Diffuse sulcal effacement and loss of gray-white matter
differentiation is noted consistent with diffuse cerebral edema,
most likely due to anoxic brain injury. Critical Value/emergent
results were called by telephone at the time of interpretation on
[DATE] at [DATE] to provider MOOLMAN , who verbally
acknowledged these results.

## 2020-11-13 IMAGING — CT CT ANGIO CHEST
3 of 7 series · 17 of 36 positions shown · IV contrast (omnipaque)
Comparison: Chest CT [DATE]

CLINICAL DATA: Patient found down. CPR performed. Evaluate for
pulmonary embolism.

EXAM:
CT ANGIOGRAPHY CHEST WITH CONTRAST
TECHNIQUE: Multidetector CT imaging of the chest was performed using the
standard protocol during bolus administration of intravenous
contrast. Multiplanar CT image reconstructions and MIPs were
obtained to evaluate the vascular anatomy.
CONTRAST:  100mL OMNIPAQUE IOHEXOL 350 MG/ML SOLN

[Series 6: pe lung · axial · 0.78mm/px · z∈[-735,-609]mm · 4 of 127 slices shown]
[im 22/127  mediastinal]
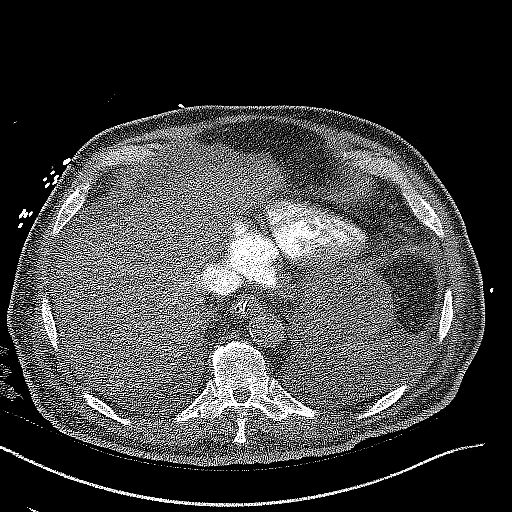
[im 43/127  mediastinal]
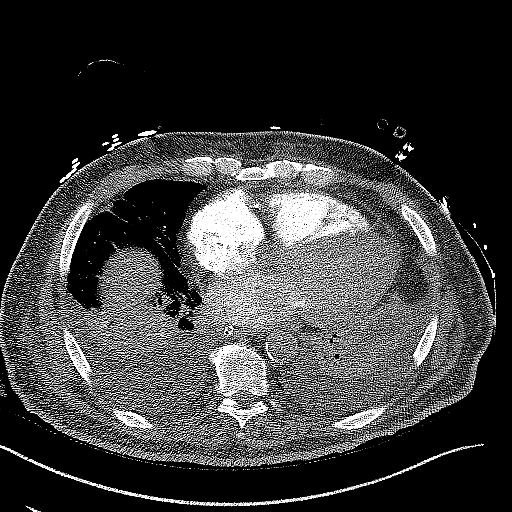
[im 64/127  mediastinal]
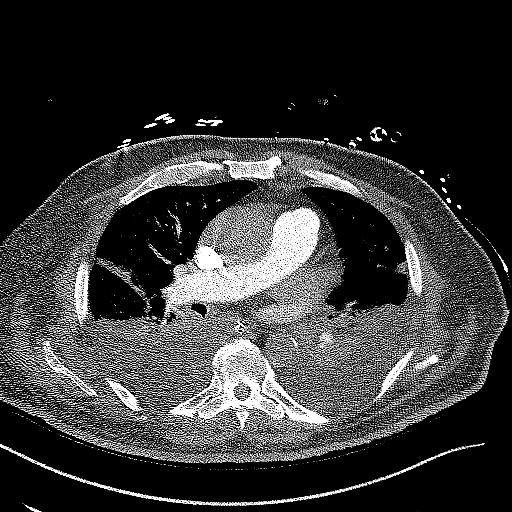
[im 85/127  mediastinal]
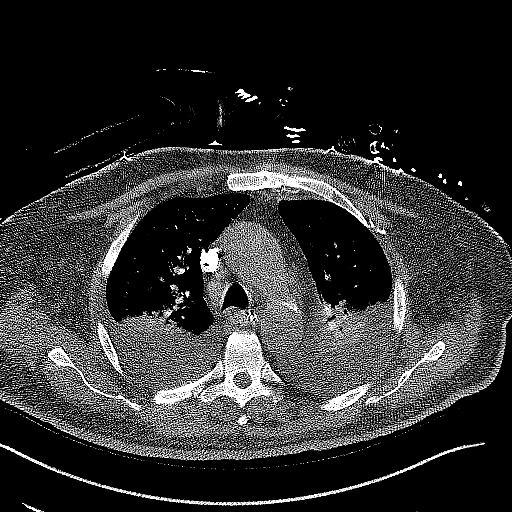

[Series 7: pe thins · axial · 0.78mm/px · z∈[-777,-546]mm · 12 of 273 slices shown]
[im 21/273  lung]
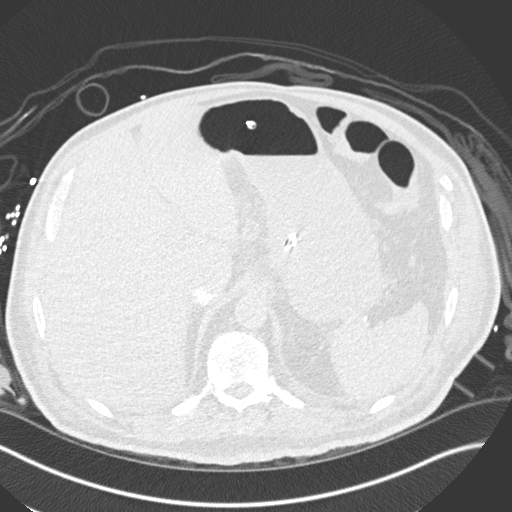
[im 42/273  mediastinal]
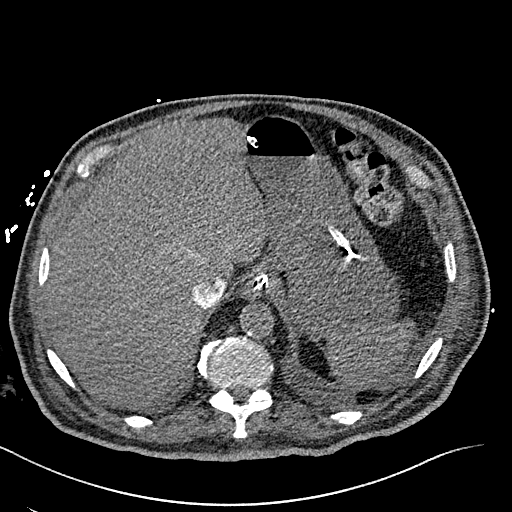
[im 63/273  lung]
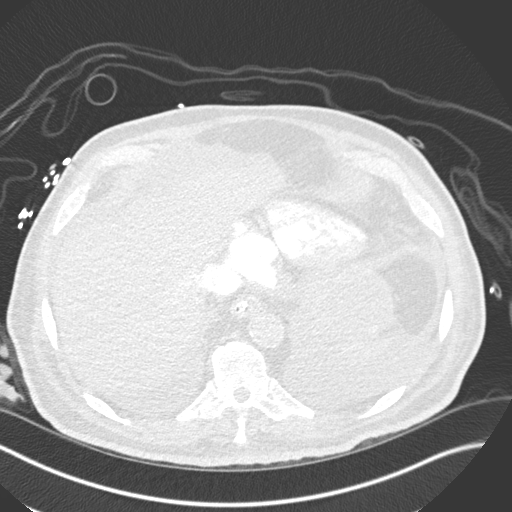
[im 84/273  mediastinal]
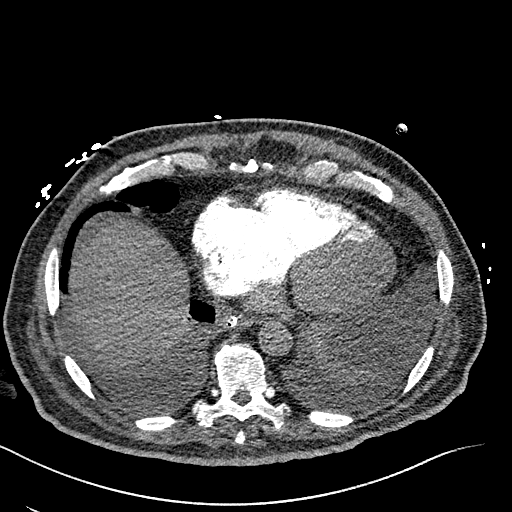
[im 105/273  lung]
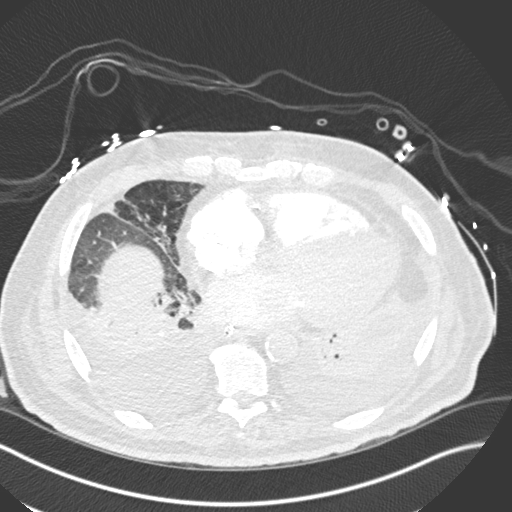
[im 126/273  mediastinal]
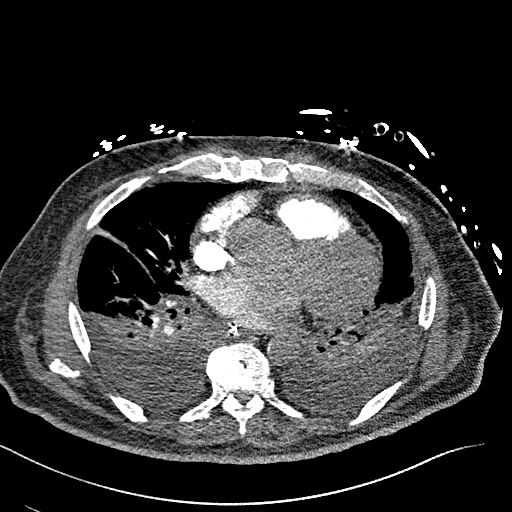
[im 147/273  lung]
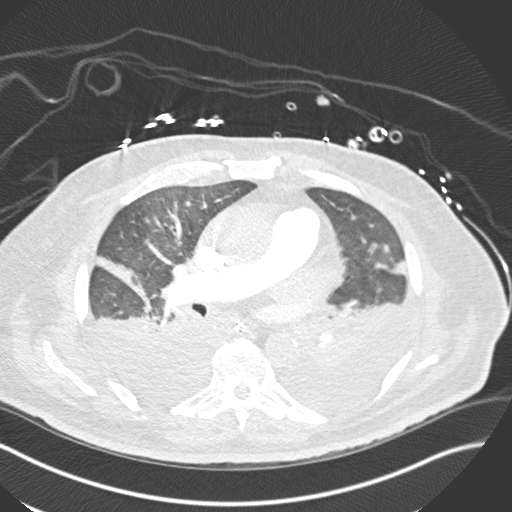
[im 168/273  mediastinal]
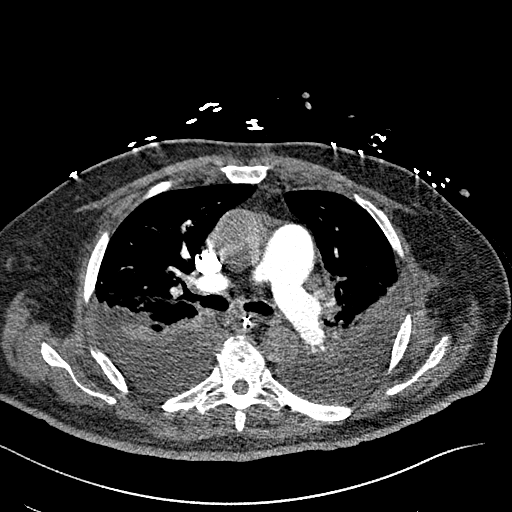
[im 189/273  lung]
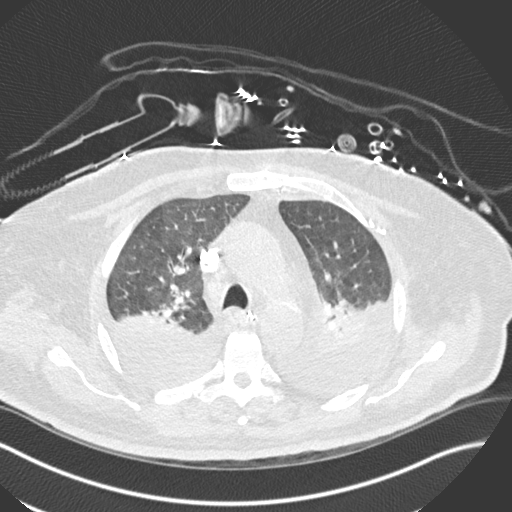
[im 210/273  mediastinal]
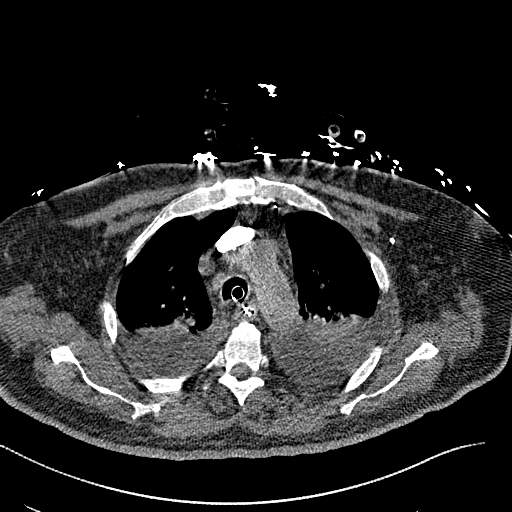
[im 231/273  lung]
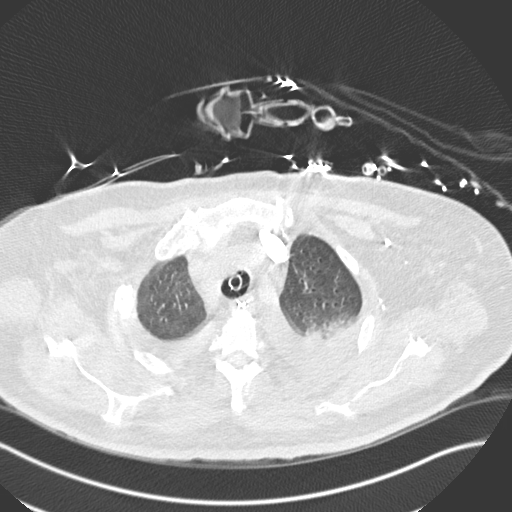
[im 252/273  mediastinal]
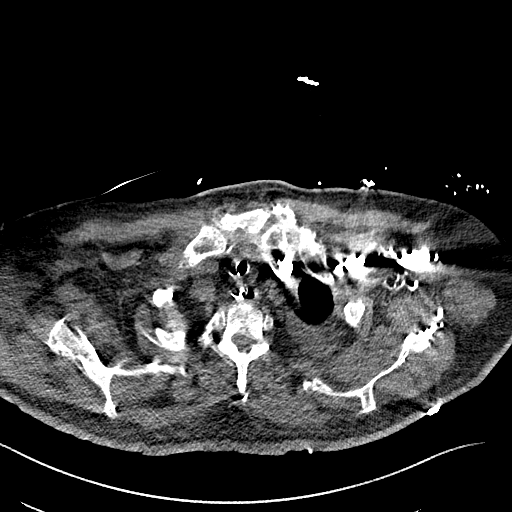

[Series 8: pe 2mm cor · coronal · 0.56mm/px · 1 of 122 slices shown]
[im 61/122  mediastinal]
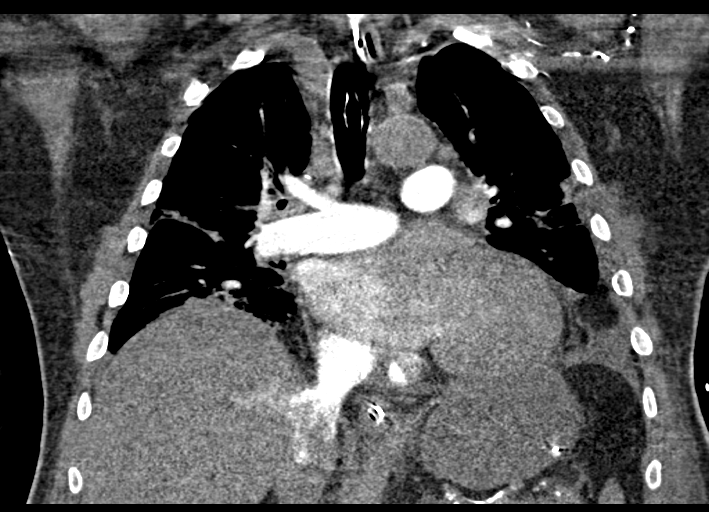

[17 of 36 positions shown; findings below may reference images not displayed]

FINDINGS: Cardiovascular: No evidence for pulmonary embolism. The main
pulmonary arteries, lobar arteries and proximal segmental arteries
are adequately opacified. Distal vessels are difficult to evaluate
due to compressive atelectasis in the lungs. Mitral annular
calcifications. Again noted is dilatation of the ascending thoracic
aorta roughly measuring 4.4 cm. Heart is prominent for size.

Mediastinum/Nodes: Mildly prominent mediastinal lymph nodes are
nonspecific. Index node along the right paratracheal region on
sequence 5, image 38 measures 1 cm in the short axis. There is a
nasogastric tube that extends into the stomach.

Lungs/Pleura: Bilateral pleural effusions. Pleural effusions are
moderate to large in size. There is compressive atelectasis
involving both lungs secondary to the pleural effusions. Previously,
the patient only had a small amount of left pleural fluid. Near
complete collapse or consolidation in the left lower lobe. Again
noted are small pulmonary nodules in the right middle lobe region,
largest measuring 4 mm on sequence 6, image 67. Limited evaluation
for pulmonary nodularity due to motion artifact and the compressive
atelectasis. Large amount of volume loss and consolidation in the
right lower lobe. Endotracheal tube is positioned just above the
carina.

Upper Abdomen: Nasogastric tube extends into the stomach. Mild
distention of the stomach. Refer to the dedicated CT of the abdomen
and pelvis study from the same day for further CT evaluation. Small
amount of perihepatic ascites.

Musculoskeletal: No acute bone abnormality.

Review of the MIP images confirms the above findings.
IMPRESSION: 1. Negative for pulmonary embolism.
2. Moderate to large bilateral pleural effusions with extensive
compressive atelectasis in both lungs, particularly in the lower
lobes.
3. Again noted are small pulmonary nodules that are poorly
characterized on this examination due to the volume loss and lung
densities.
4. Small amount of perihepatic ascites. Please refer to the
abdominal CT from the same date.
5. Fusiform aneurysm of the ascending thoracic aorta measuring to
4.4 cm. Recommend annual imaging followup by CTA or MRA. This
recommendation follows [0M]
ACCF/AHA/AATS/ACR/ASA/SCA/AUNG/AUNG/AUNG/AUNG Guidelines for the
Diagnosis and Management of Patients with Thoracic Aortic Disease.
Circulation. [0M]; 121: [id number]. Aortic aneurysm NOS ([0M]-[0M])

## 2020-11-13 MED ORDER — FAMOTIDINE IN NACL 20-0.9 MG/50ML-% IV SOLN
20.0000 mg | Freq: Two times a day (BID) | INTRAVENOUS | Status: DC
  Administered 2020-11-13 – 2020-11-15 (×5): 20 mg via INTRAVENOUS
  Filled 2020-11-13 (×5): qty 50

## 2020-11-13 MED ORDER — PIPERACILLIN-TAZOBACTAM 3.375 G IVPB 30 MIN
3.3750 g | Freq: Once | INTRAVENOUS | Status: AC
  Administered 2020-11-13: 3.375 g via INTRAVENOUS
  Filled 2020-11-13: qty 50

## 2020-11-13 MED ORDER — NOREPINEPHRINE 4 MG/250ML-% IV SOLN: 0.0000 ug/min | INTRAVENOUS | Status: DC

## 2020-11-13 MED ORDER — AMIODARONE HCL IN DEXTROSE 360-4.14 MG/200ML-% IV SOLN
60.0000 mg/h | INTRAVENOUS | Status: AC
  Administered 2020-11-13: 60 mg/h via INTRAVENOUS
  Filled 2020-11-13 (×2): qty 200

## 2020-11-13 MED ORDER — CHLORHEXIDINE GLUCONATE CLOTH 2 % EX PADS
6.0000 | MEDICATED_PAD | Freq: Every day | CUTANEOUS | Status: DC
  Administered 2020-11-14: 6 via TOPICAL

## 2020-11-13 MED ORDER — HEPARIN (PORCINE) 25000 UT/250ML-% IV SOLN: 1300.0000 [IU]/h | INTRAVENOUS | Status: DC

## 2020-11-13 MED ORDER — HEPARIN SODIUM (PORCINE) 5000 UNIT/ML IJ SOLN
5000.0000 [IU] | Freq: Three times a day (TID) | INTRAMUSCULAR | Status: DC
  Administered 2020-11-13: 5000 [IU] via SUBCUTANEOUS
  Filled 2020-11-13: qty 1

## 2020-11-13 MED ORDER — IOHEXOL 350 MG/ML SOLN
100.0000 mL | Freq: Once | INTRAVENOUS | Status: AC | PRN
  Administered 2020-11-13: 100 mL via INTRAVENOUS

## 2020-11-13 MED ORDER — NOREPINEPHRINE 4 MG/250ML-% IV SOLN
0.0000 ug/min | INTRAVENOUS | Status: DC
  Administered 2020-11-13: 13.013 ug/min via INTRAVENOUS
  Administered 2020-11-13: 30 ug/min via INTRAVENOUS
  Administered 2020-11-13 (×2): 20 ug/min via INTRAVENOUS
  Administered 2020-11-14: 15 ug/min via INTRAVENOUS
  Administered 2020-11-14 (×2): 20 ug/min via INTRAVENOUS
  Administered 2020-11-14: 25 ug/min via INTRAVENOUS
  Administered 2020-11-14: 15 ug/min via INTRAVENOUS
  Administered 2020-11-14: 20 ug/min via INTRAVENOUS
  Administered 2020-11-15: 25 ug/min via INTRAVENOUS
  Administered 2020-11-15: 24 ug/min via INTRAVENOUS
  Administered 2020-11-15: 25 ug/min via INTRAVENOUS
  Administered 2020-11-15: 24 ug/min via INTRAVENOUS
  Administered 2020-11-15: 25 ug/min via INTRAVENOUS
  Filled 2020-11-13 (×6): qty 250
  Filled 2020-11-13 (×2): qty 500
  Filled 2020-11-13 (×5): qty 250

## 2020-11-13 MED ORDER — SODIUM CHLORIDE 0.9 % IV BOLUS
1000.0000 mL | Freq: Once | INTRAVENOUS | Status: AC
  Administered 2020-11-13: 1000 mL via INTRAVENOUS

## 2020-11-13 MED ORDER — AMIODARONE HCL IN DEXTROSE 360-4.14 MG/200ML-% IV SOLN
30.0000 mg/h | INTRAVENOUS | Status: DC
  Administered 2020-11-14 – 2020-11-15 (×4): 30 mg/h via INTRAVENOUS
  Filled 2020-11-13 (×3): qty 200

## 2020-11-13 MED ORDER — PIPERACILLIN-TAZOBACTAM 3.375 G IVPB
3.3750 g | Freq: Three times a day (TID) | INTRAVENOUS | Status: DC
  Administered 2020-11-13 – 2020-11-15 (×6): 3.375 g via INTRAVENOUS
  Filled 2020-11-13 (×6): qty 50

## 2020-11-13 MED ORDER — NOREPINEPHRINE 4 MG/250ML-% IV SOLN
INTRAVENOUS | Status: AC
  Filled 2020-11-13: qty 250

## 2020-11-13 MED ORDER — ASPIRIN 300 MG RE SUPP
300.0000 mg | RECTAL | Status: AC
  Filled 2020-11-13: qty 1

## 2020-11-13 MED ORDER — PANTOPRAZOLE 80MG IVPB - SIMPLE MED
80.0000 mg | Freq: Once | INTRAVENOUS | Status: AC
  Administered 2020-11-14: 80 mg via INTRAVENOUS
  Filled 2020-11-13: qty 80

## 2020-11-13 MED ORDER — SODIUM CHLORIDE 0.9 % IV SOLN: INTRAVENOUS | Status: DC
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
## 2020-11-13 NOTE — Progress Notes (Signed)
eLink Physician-Brief Progress Note Patient Name: Daniel [name] DOB: [birth year] MRN: [id number]   Date of Service  11/14/2020  HPI/Events of Note  Family having difficulty accepting the clinical situation and poor prognosis for meaningful neurological recovery. The daughter wishes to reverse the patient's DNR status.   eICU Interventions  Plan: Will make patient full code status per the wishes of the familtly.      Intervention Category Major Interventions: End of life / care limitation discussion  Lysle Dingwall 11/17/2020, 8:25 PM

## 2020-11-13 NOTE — ED Notes (Signed)
RT present. Pt to CT, then toi 2H. VSS, levophed decreased.

## 2020-11-13 NOTE — ED Notes (Signed)
To CT w/o change. IVF/meds switched to R EJ. CT initiated.

## 2020-11-13 NOTE — Progress Notes (Signed)
On levophed 86LRJ bp is 736 systolic.  HR 110s.  Discussed course of evening with daughter Daniel [name]) at bedside.  Discussed option for comfort care.  Plan for now is to continue current care, DNR DNI. She does not want to progress to comfort care at this time.  Will order amio gtt.   Hold off on central line for now, has IO.

## 2020-11-13 NOTE — Progress Notes (Signed)
V LTM EEG no longer warranted per Dr. Tacy Learn
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
## 2020-11-13 NOTE — Progress Notes (Signed)
Patient was made full code at 2025. At 2126 patient went into vfib. CPR initiated. See CPR record for full report.

## 2020-11-13 NOTE — Plan of Care (Signed)
Admitting with patient's daughter, grandson, niece and family friend.  Updated patient's condition and CT head scan findings which are consistent with anoxic brain injury and diffuse cerebral edema.  Patient's family would like to visit and pay their regards. They decided to place DNR order and possible palliative extubation later today versus tomorrow.  DNR orders were placed.     Jacky Kindle MD [place] [facility] See Amion for pager If no response to pager, please call [phone] until 7pm After 7pm, Please call E-link [phone]

## 2020-11-13 NOTE — ED Triage Notes (Signed)
Patient BIB [facility] from [facility]. Pt with unknown down time found by nursing staff, CPR started by facility. Patient received 25 min CPR, epi x5. ROSC enroute to hospital.

## 2020-11-13 NOTE — ED Notes (Signed)
Report received from [place], [place]. MD at [facility]. VSS, HR 130. Levophed at 130/hr

## 2020-11-13 NOTE — ED Notes (Signed)
Ice packs placed started cooling protocol.

## 2020-11-13 NOTE — Progress Notes (Signed)
   11/22/2020 2239  Clinical Encounter Type  Visited With Family;Health care provider  Visit Type Initial;Critical Care;Code   Chaplain responded to a code blue. Chaplain met with patient's daughter [name] and other family members. Chaplain extended hospitality.Chaplain introduced spiritual care services. Spiritual care services available as needed.   Jeri Lager, Chaplain

## 2020-11-13 NOTE — Plan of Care (Signed)
Spoke with daughter of patient. Stated that with patients already known anoxic brain injury his prognosis was poor. Likelihood on meaningful survival was low. She would not want to continue with invasive measures such as a line. Nor would she want to continue with shocks or chest compressions. She would like him to remain DNR and would like to spend the last few moments with him. She is aware patient is very unstable and will likely pass this evening.   Newell Coral DO Internal Medicine/Pediatrics Pulmonary and Critical Care

## 2020-11-13 NOTE — Progress Notes (Signed)
Pt transported to CT and then to 2H02 without any complications.
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
## 2020-11-13 NOTE — ED Notes (Signed)
Family is in consultation room A

## 2020-11-14 DIAGNOSIS — I469 Cardiac arrest, cause unspecified: Secondary | ICD-10-CM | POA: Diagnosis not present

## 2020-11-14 LAB — POCT I-STAT 7, (LYTES, BLD GAS, ICA,H+H)
Acid-Base Excess: 9 mmol/L — ABNORMAL HIGH (ref 0.0–2.0)
Bicarbonate: 28.3 mmol/L — ABNORMAL HIGH (ref 20.0–28.0)
Calcium, Ion: 1.12 mmol/L — ABNORMAL LOW (ref 1.15–1.40)
HCT: 36 % — ABNORMAL LOW (ref 39.0–52.0)
Hemoglobin: 12.2 g/dL — ABNORMAL LOW (ref 13.0–17.0)
O2 Saturation: 98 %
Potassium: 2.7 mmol/L — CL (ref 3.5–5.1)
Sodium: 148 mmol/L — ABNORMAL HIGH (ref 135–145)
TCO2: 29 mmol/L (ref 22–32)
pCO2 arterial: 24.3 mmHg — ABNORMAL LOW (ref 32.0–48.0)
pH, Arterial: 7.674 (ref 7.350–7.450)
pO2, Arterial: 80 mmHg — ABNORMAL LOW (ref 83.0–108.0)

## 2020-11-14 LAB — GLUCOSE, CAPILLARY
Glucose-Capillary: 130 mg/dL — ABNORMAL HIGH (ref 70–99)
Glucose-Capillary: 150 mg/dL — ABNORMAL HIGH (ref 70–99)
Glucose-Capillary: 126 mg/dL — ABNORMAL HIGH (ref 70–99)
Glucose-Capillary: 137 mg/dL — ABNORMAL HIGH (ref 70–99)

## 2020-11-14 LAB — BASIC METABOLIC PANEL
Anion gap: 12 (ref 5–15)
BUN: 23 mg/dL (ref 8–23)
CO2: 25 mmol/L (ref 22–32)
Calcium: 8.3 mg/dL — ABNORMAL LOW (ref 8.9–10.3)
Chloride: 107 mmol/L (ref 98–111)
Creatinine, Ser: 1.08 mg/dL (ref 0.61–1.24)
GFR, Estimated: >60 mL/min (ref >60)
Glucose, Bld: 171 mg/dL — ABNORMAL HIGH (ref 70–99)
Potassium: 4.8 mmol/L (ref 3.5–5.1)
Sodium: 144 mmol/L (ref 135–145)

## 2020-11-14 LAB — CBC
HCT: 38.8 % — ABNORMAL LOW (ref 39.0–52.0)
Hemoglobin: 11.5 g/dL — ABNORMAL LOW (ref 13.0–17.0)
MCH: 25.7 pg — ABNORMAL LOW (ref 26.0–34.0)
MCHC: 29.6 g/dL — ABNORMAL LOW (ref 30.0–36.0)
MCV: 86.8 fL (ref 80.0–100.0)
Platelets: 411 10*3/uL — ABNORMAL HIGH (ref 150–400)
RBC: 4.47 MIL/uL (ref 4.22–5.81)
RDW: 18.1 % — ABNORMAL HIGH (ref 11.5–15.5)
WBC: 15.9 10*3/uL — ABNORMAL HIGH (ref 4.0–10.5)
nRBC: 0.4 % — ABNORMAL HIGH (ref 0.0–0.2)

## 2020-11-14 LAB — PHOSPHORUS: Phosphorus: <1 mg/dL — CL (ref 2.5–4.6)

## 2020-11-14 LAB — MAGNESIUM: Magnesium: 1.9 mg/dL (ref 1.7–2.4)

## 2020-11-14 MED ORDER — ORAL CARE MOUTH RINSE
15.0000 mL | OROMUCOSAL | Status: DC
  Administered 2020-11-14 – 2020-11-15 (×10): 15 mL via OROMUCOSAL

## 2020-11-14 MED ORDER — CHLORHEXIDINE GLUCONATE 0.12% ORAL RINSE (MEDLINE KIT)
15.0000 mL | Freq: Two times a day (BID) | OROMUCOSAL | Status: DC
  Administered 2020-11-14 – 2020-11-15 (×3): 15 mL via OROMUCOSAL

## 2020-11-14 MED ORDER — SODIUM PHOSPHATES 45 MMOLE/15ML IV SOLN
30.0000 mmol | Freq: Once | INTRAVENOUS | Status: AC
  Administered 2020-11-14: 30 mmol via INTRAVENOUS
  Filled 2020-11-14: qty 10

## 2020-11-14 MED ORDER — PANTOPRAZOLE SODIUM 40 MG IV SOLR
40.0000 mg | INTRAVENOUS | Status: DC
  Administered 2020-11-14: 40 mg via INTRAVENOUS
  Filled 2020-11-14: qty 40
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
## 2020-11-14 NOTE — Progress Notes (Signed)
eLink Physician-Brief Progress Note Patient Name: Daniel [name] DOB: 1940-07-25 MRN: [id number]   Date of Service  11/14/2020  HPI/Events of Note  Multiple issues: 1. Patient on mechanical ventilation. Notified of need for stress ulcer prophylaxis. 2. Hypophosphatemia - Phosphorus < 1.0.   eICU Interventions  Plan: Protonix IV. Replace phosphorus.     Intervention Category Major Interventions: Electrolyte abnormality - evaluation and management Intermediate Interventions: Best-practice therapies (e.g. DVT, beta blocker, etc.)  Krisalyn Yankowski [place] 11/14/2020, 8:21 PM

## 2020-11-14 NOTE — Progress Notes (Signed)
After goals of care discussion, family communicated the desire to withdraw support and compassionately extubate at 0900 on 7/4. Education provided on nursing's role during this time, and emotional support given. CCM aware.

## 2020-11-14 NOTE — Progress Notes (Signed)
Received critical lab value of a phosphorus less than 1. Dr. Lynetta Mare notified.

## 2020-11-15 DIAGNOSIS — I469 Cardiac arrest, cause unspecified: Secondary | ICD-10-CM | POA: Diagnosis not present

## 2020-11-15 LAB — MAGNESIUM: Magnesium: 1.7 mg/dL (ref 1.7–2.4)

## 2020-11-15 LAB — BASIC METABOLIC PANEL
Anion gap: 13 (ref 5–15)
BUN: 27 mg/dL — ABNORMAL HIGH (ref 8–23)
CO2: 21 mmol/L — ABNORMAL LOW (ref 22–32)
Calcium: 7.8 mg/dL — ABNORMAL LOW (ref 8.9–10.3)
Chloride: 108 mmol/L (ref 98–111)
Creatinine, Ser: 1.34 mg/dL — ABNORMAL HIGH (ref 0.61–1.24)
GFR, Estimated: 54 mL/min — ABNORMAL LOW (ref >60)
Glucose, Bld: 162 mg/dL — ABNORMAL HIGH (ref 70–99)
Potassium: 3.7 mmol/L (ref 3.5–5.1)
Sodium: 142 mmol/L (ref 135–145)

## 2020-11-15 LAB — GLUCOSE, CAPILLARY: Glucose-Capillary: 159 mg/dL — ABNORMAL HIGH (ref 70–99)

## 2020-11-15 MED ORDER — GLYCOPYRROLATE 0.2 MG/ML IJ SOLN: 0.2000 mg | INTRAMUSCULAR | Status: DC | PRN

## 2020-11-15 MED ORDER — DEXTROSE 5 % IV SOLN: INTRAVENOUS | Status: DC

## 2020-11-15 MED ORDER — MORPHINE SULFATE (PF) 2 MG/ML IV SOLN: 2.0000 mg | INTRAVENOUS | Status: DC | PRN

## 2020-11-15 MED ORDER — GLYCOPYRROLATE 1 MG PO TABS
1.0000 mg | ORAL_TABLET | ORAL | Status: DC | PRN
  Filled 2020-11-15: qty 1

## 2020-11-17 LAB — GLUCOSE, CAPILLARY
Glucose-Capillary: 139 mg/dL — ABNORMAL HIGH (ref 70–99)
Glucose-Capillary: 143 mg/dL — ABNORMAL HIGH (ref 70–99)
Glucose-Capillary: 175 mg/dL — ABNORMAL HIGH (ref 70–99)

## 2020-11-17 MED FILL — Medication: Qty: 1 | Status: AC

## 2020-11-18 ENCOUNTER — Telehealth: Payer: Medicare Other

## 2020-11-18 LAB — CULTURE, BLOOD (ROUTINE X 2): Culture: NO GROWTH

## 2020-11-18 LAB — GLUCOSE, CAPILLARY: Glucose-Capillary: 146 mg/dL — ABNORMAL HIGH (ref 70–99)
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
## 2020-12-13 NOTE — Progress Notes (Addendum)
   December 01, 2020 0857  Clinical Encounter Type  Visited With Patient and family together  Visit Type Spiritual support  Referral From Nurse  Consult/Referral To Chaplain  Spiritual Encounters  Spiritual Needs Emotional  Nurse Gaspar Bidding paged and explained Daniel [name] family has decided to extubate this morning. He told the daughter about the chaplain services. Chaplain offered support. His grandsons were emotional and talked about how this was just unexpected. Mr. Rideaux was alert and doing well and now he is unresponsive. Gave the family comfort. [name], his daughter stated other family members will be arriving. I gave the family time alone and told [name] if she needs me to return, please have nurse Gaspar Bidding page me.    Chaplain Prashant Glosser [place]  [phone]
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
## 2020-12-13 NOTE — Plan of Care (Signed)
  Problem: Clinical Measurements: Goal: Ability to maintain clinical measurements within normal limits will improve Outcome: Not Progressing Goal: Will remain free from infection Outcome: Progressing Goal: Respiratory complications will improve Outcome: Not Progressing Goal: Cardiovascular complication will be avoided Outcome: Not Progressing   Pt remains on Levo and Amiodarone. A- fib RVR with frequent run of Non-sustained V-Tach. He has no protective reflexes anddoes not breath over the vent.  Problem: Nutrition: Goal: Adequate nutrition will be maintained Outcome: Not Progressing  Problem: Elimination: Goal: Will not experience complications related to bowel motility Outcome: Not Progressing   Abdomen is soft and distended, with hypoactive bowel sounds   Problem: Pain Managment: Goal: General experience of comfort will improve Outcome: Progressing  Pt is not showing any signs of pain  Problem: Safety: Goal: Ability to remain free from injury will improve Outcome: Progressing   Problem: Skin Integrity: Goal: Risk for impaired skin integrity will decrease Outcome: Not Progressing  None healing pressure ulcer to sacral area and left heal.

## 2020-12-13 NOTE — Procedures (Signed)
Extubation Procedure Note  Patient Details:   Name: Daniel [name] DOB: 05-04-1941 MRN: [id number]   Airway Documentation:    Vent end date: (not recorded) Vent end time: (not recorded)   Evaluation  O2 sats: currently acceptable Complications: No apparent complications Patient did tolerate procedure well. Bilateral Breath Sounds: Diminished   No  Vallery Mcdade Dec 02, 2020, 3:13 PM

## 2020-12-13 DEATH — deceased
